# Patient Record
Sex: Female | Born: 1935 | Race: White | Hispanic: No | Smoking: Former smoker
Health system: Southern US, Community
[De-identification: ages and names within clinical notes are randomized; demographics above are authoritative.]

## PROBLEM LIST (undated history)

## (undated) DIAGNOSIS — M1990 Unspecified osteoarthritis, unspecified site: Secondary | ICD-10-CM

## (undated) DIAGNOSIS — E559 Vitamin D deficiency, unspecified: Secondary | ICD-10-CM

## (undated) DIAGNOSIS — C50919 Malignant neoplasm of unspecified site of unspecified female breast: Secondary | ICD-10-CM

## (undated) DIAGNOSIS — C801 Malignant (primary) neoplasm, unspecified: Secondary | ICD-10-CM

## (undated) DIAGNOSIS — R7303 Prediabetes: Secondary | ICD-10-CM

## (undated) DIAGNOSIS — E785 Hyperlipidemia, unspecified: Secondary | ICD-10-CM

## (undated) DIAGNOSIS — Z923 Personal history of irradiation: Secondary | ICD-10-CM

## (undated) DIAGNOSIS — C439 Malignant melanoma of skin, unspecified: Secondary | ICD-10-CM

## (undated) HISTORY — DX: Malignant melanoma of skin, unspecified: C43.9

## (undated) HISTORY — DX: Unspecified osteoarthritis, unspecified site: M19.90

## (undated) HISTORY — DX: Hyperlipidemia, unspecified: E78.5

## (undated) HISTORY — DX: Vitamin D deficiency, unspecified: E55.9

## (undated) HISTORY — DX: Prediabetes: R73.03

## (undated) HISTORY — DX: Malignant (primary) neoplasm, unspecified: C80.1

---

## 1997-04-21 ENCOUNTER — Other Ambulatory Visit: Admission: RE | Admit: 1997-04-21 | Discharge: 1997-04-21 | Payer: Self-pay | Admitting: Family Medicine

## 1998-04-14 ENCOUNTER — Other Ambulatory Visit: Admission: RE | Admit: 1998-04-14 | Discharge: 1998-04-14 | Payer: Self-pay | Admitting: Family Medicine

## 1999-04-11 ENCOUNTER — Encounter: Payer: Self-pay | Admitting: Family Medicine

## 1999-04-11 ENCOUNTER — Encounter: Admission: RE | Admit: 1999-04-11 | Discharge: 1999-04-11 | Payer: Self-pay | Admitting: Family Medicine

## 1999-04-19 ENCOUNTER — Other Ambulatory Visit: Admission: RE | Admit: 1999-04-19 | Discharge: 1999-04-19 | Payer: Self-pay | Admitting: Family Medicine

## 1999-09-12 ENCOUNTER — Other Ambulatory Visit: Admission: RE | Admit: 1999-09-12 | Discharge: 1999-09-12 | Payer: Self-pay | Admitting: Obstetrics & Gynecology

## 1999-09-12 ENCOUNTER — Encounter (INDEPENDENT_AMBULATORY_CARE_PROVIDER_SITE_OTHER): Payer: Self-pay | Admitting: Specialist

## 1999-10-01 ENCOUNTER — Other Ambulatory Visit: Admission: RE | Admit: 1999-10-01 | Discharge: 1999-10-01 | Payer: Self-pay | Admitting: Gastroenterology

## 1999-10-01 ENCOUNTER — Encounter (INDEPENDENT_AMBULATORY_CARE_PROVIDER_SITE_OTHER): Payer: Self-pay

## 2000-04-17 ENCOUNTER — Encounter: Admission: RE | Admit: 2000-04-17 | Discharge: 2000-04-17 | Payer: Self-pay | Admitting: Radiation Oncology

## 2000-04-17 ENCOUNTER — Encounter: Payer: Self-pay | Admitting: Family Medicine

## 2001-04-06 ENCOUNTER — Encounter: Payer: Self-pay | Admitting: Family Medicine

## 2001-04-06 ENCOUNTER — Encounter: Admission: RE | Admit: 2001-04-06 | Discharge: 2001-04-06 | Payer: Self-pay | Admitting: Family Medicine

## 2001-04-27 ENCOUNTER — Other Ambulatory Visit: Admission: RE | Admit: 2001-04-27 | Discharge: 2001-04-27 | Payer: Self-pay | Admitting: Family Medicine

## 2002-02-12 LAB — HM COLONOSCOPY

## 2002-04-08 ENCOUNTER — Encounter: Payer: Self-pay | Admitting: Family Medicine

## 2002-04-08 ENCOUNTER — Encounter: Admission: RE | Admit: 2002-04-08 | Discharge: 2002-04-08 | Payer: Self-pay | Admitting: Family Medicine

## 2003-04-11 ENCOUNTER — Encounter: Admission: RE | Admit: 2003-04-11 | Discharge: 2003-04-11 | Payer: Self-pay | Admitting: Family Medicine

## 2003-05-06 ENCOUNTER — Other Ambulatory Visit: Admission: RE | Admit: 2003-05-06 | Discharge: 2003-05-06 | Payer: Self-pay | Admitting: Family Medicine

## 2004-04-16 ENCOUNTER — Encounter: Admission: RE | Admit: 2004-04-16 | Discharge: 2004-04-16 | Payer: Self-pay | Admitting: Family Medicine

## 2005-04-19 ENCOUNTER — Encounter: Admission: RE | Admit: 2005-04-19 | Discharge: 2005-04-19 | Payer: Self-pay | Admitting: Family Medicine

## 2005-05-09 ENCOUNTER — Other Ambulatory Visit: Admission: RE | Admit: 2005-05-09 | Discharge: 2005-05-09 | Payer: Self-pay | Admitting: Family Medicine

## 2006-04-21 ENCOUNTER — Encounter: Admission: RE | Admit: 2006-04-21 | Discharge: 2006-04-21 | Payer: Self-pay | Admitting: Family Medicine

## 2007-01-22 HISTORY — PX: CATARACT EXTRACTION: SUR2

## 2007-04-27 ENCOUNTER — Encounter: Admission: RE | Admit: 2007-04-27 | Discharge: 2007-04-27 | Payer: Self-pay | Admitting: Family Medicine

## 2008-05-09 ENCOUNTER — Encounter: Admission: RE | Admit: 2008-05-09 | Discharge: 2008-05-09 | Payer: Self-pay | Admitting: Internal Medicine

## 2009-05-15 ENCOUNTER — Encounter: Admission: RE | Admit: 2009-05-15 | Discharge: 2009-05-15 | Payer: Self-pay | Admitting: Internal Medicine

## 2009-09-20 ENCOUNTER — Encounter: Admission: RE | Admit: 2009-09-20 | Discharge: 2009-09-20 | Payer: Self-pay | Admitting: Internal Medicine

## 2010-01-21 DIAGNOSIS — Z923 Personal history of irradiation: Secondary | ICD-10-CM

## 2010-01-21 DIAGNOSIS — C50919 Malignant neoplasm of unspecified site of unspecified female breast: Secondary | ICD-10-CM

## 2010-01-21 DIAGNOSIS — C801 Malignant (primary) neoplasm, unspecified: Secondary | ICD-10-CM

## 2010-01-21 HISTORY — DX: Personal history of irradiation: Z92.3

## 2010-01-21 HISTORY — DX: Malignant neoplasm of unspecified site of unspecified female breast: C50.919

## 2010-01-21 HISTORY — DX: Malignant (primary) neoplasm, unspecified: C80.1

## 2010-04-11 ENCOUNTER — Other Ambulatory Visit: Payer: Self-pay | Admitting: Internal Medicine

## 2010-04-11 DIAGNOSIS — Z1231 Encounter for screening mammogram for malignant neoplasm of breast: Secondary | ICD-10-CM

## 2010-05-21 ENCOUNTER — Ambulatory Visit
Admission: RE | Admit: 2010-05-21 | Discharge: 2010-05-21 | Disposition: A | Discharge status: Home / Self Care | Payer: Medicare Other | Source: Ambulatory Visit | Attending: Internal Medicine | Admitting: Internal Medicine

## 2010-05-21 DIAGNOSIS — Z1231 Encounter for screening mammogram for malignant neoplasm of breast: Secondary | ICD-10-CM

## 2010-05-22 ENCOUNTER — Other Ambulatory Visit: Payer: Self-pay | Admitting: Internal Medicine

## 2010-05-22 DIAGNOSIS — R928 Other abnormal and inconclusive findings on diagnostic imaging of breast: Secondary | ICD-10-CM

## 2010-05-28 ENCOUNTER — Ambulatory Visit
Admission: RE | Admit: 2010-05-28 | Discharge: 2010-05-28 | Disposition: A | Discharge status: Home / Self Care | Payer: Medicare Other | Source: Ambulatory Visit | Attending: Internal Medicine | Admitting: Internal Medicine

## 2010-05-28 ENCOUNTER — Other Ambulatory Visit: Payer: Self-pay | Admitting: Diagnostic Radiology

## 2010-05-28 ENCOUNTER — Other Ambulatory Visit: Payer: Self-pay | Admitting: Internal Medicine

## 2010-05-28 DIAGNOSIS — R928 Other abnormal and inconclusive findings on diagnostic imaging of breast: Secondary | ICD-10-CM

## 2010-05-28 DIAGNOSIS — N6310 Unspecified lump in the right breast, unspecified quadrant: Secondary | ICD-10-CM

## 2010-05-28 HISTORY — PX: BREAST BIOPSY: SHX20

## 2010-05-29 ENCOUNTER — Other Ambulatory Visit: Payer: Self-pay | Admitting: Internal Medicine

## 2010-05-29 DIAGNOSIS — C50911 Malignant neoplasm of unspecified site of right female breast: Secondary | ICD-10-CM

## 2010-06-02 ENCOUNTER — Ambulatory Visit
Admission: RE | Admit: 2010-06-02 | Discharge: 2010-06-02 | Disposition: A | Discharge status: Home / Self Care | Payer: Medicare Other | Source: Ambulatory Visit | Attending: Internal Medicine | Admitting: Internal Medicine

## 2010-06-02 ENCOUNTER — Other Ambulatory Visit: Payer: Medicare Other

## 2010-06-02 DIAGNOSIS — C50911 Malignant neoplasm of unspecified site of right female breast: Secondary | ICD-10-CM

## 2010-06-02 MED ORDER — GADOBENATE DIMEGLUMINE 529 MG/ML IV SOLN
13.0000 mL | Freq: Once | INTRAVENOUS | Status: AC | PRN
  Administered 2010-06-02: 13 mL via INTRAVENOUS

## 2010-06-06 ENCOUNTER — Other Ambulatory Visit: Payer: Self-pay | Admitting: Oncology

## 2010-06-06 ENCOUNTER — Encounter (HOSPITAL_BASED_OUTPATIENT_CLINIC_OR_DEPARTMENT_OTHER): Payer: Medicare Other | Admitting: Oncology

## 2010-06-06 ENCOUNTER — Encounter: Payer: Medicare Other | Admitting: Oncology

## 2010-06-06 DIAGNOSIS — C50919 Malignant neoplasm of unspecified site of unspecified female breast: Secondary | ICD-10-CM

## 2010-06-06 LAB — COMPREHENSIVE METABOLIC PANEL
AST: 20 U/L (ref 0–37)
Albumin: 3.9 g/dL (ref 3.5–5.2)
Alkaline Phosphatase: 54 U/L (ref 39–117)
BUN: 15 mg/dL (ref 6–23)
Calcium: 9.4 mg/dL (ref 8.4–10.5)
Chloride: 102 mEq/L (ref 96–112)
Glucose, Bld: 110 mg/dL — ABNORMAL HIGH (ref 70–99)
Total Protein: 6.7 g/dL (ref 6.0–8.3)

## 2010-06-06 LAB — CBC WITH DIFFERENTIAL/PLATELET
BASO%: 0.7 % (ref 0.0–2.0)
Basophils Absolute: 0 10*3/uL (ref 0.0–0.1)
EOS%: 2.3 % (ref 0.0–7.0)
Eosinophils Absolute: 0.1 10*3/uL (ref 0.0–0.5)
LYMPH%: 29.2 % (ref 14.0–49.7)
MCHC: 34.2 g/dL (ref 31.5–36.0)
MCV: 95.6 fL (ref 79.5–101.0)
Platelets: 222 10*3/uL (ref 145–400)
RBC: 4.16 10*6/uL (ref 3.70–5.45)
RDW: 12.9 % (ref 11.2–14.5)
WBC: 5.9 10*3/uL (ref 3.9–10.3)

## 2010-06-06 LAB — CANCER ANTIGEN 27.29: CA 27.29: 35 U/mL (ref 0–39)

## 2010-06-08 ENCOUNTER — Other Ambulatory Visit (INDEPENDENT_AMBULATORY_CARE_PROVIDER_SITE_OTHER): Payer: Self-pay | Admitting: Surgery

## 2010-06-08 DIAGNOSIS — C50911 Malignant neoplasm of unspecified site of right female breast: Secondary | ICD-10-CM

## 2010-06-11 ENCOUNTER — Other Ambulatory Visit: Payer: Self-pay | Admitting: Emergency Medicine

## 2010-06-11 ENCOUNTER — Other Ambulatory Visit (HOSPITAL_COMMUNITY)
Admission: RE | Admit: 2010-06-11 | Discharge: 2010-06-11 | Disposition: A | Discharge status: Home / Self Care | Payer: Medicare Other | Source: Ambulatory Visit | Attending: Internal Medicine | Admitting: Internal Medicine

## 2010-06-11 DIAGNOSIS — Z01419 Encounter for gynecological examination (general) (routine) without abnormal findings: Secondary | ICD-10-CM | POA: Insufficient documentation

## 2010-06-21 ENCOUNTER — Encounter (HOSPITAL_COMMUNITY)
Admission: RE | Admit: 2010-06-21 | Discharge: 2010-06-21 | Disposition: A | Discharge status: Home / Self Care | Payer: Medicare Other | Source: Ambulatory Visit | Attending: Surgery | Admitting: Surgery

## 2010-06-21 LAB — SURGICAL PCR SCREEN
MRSA, PCR: NEGATIVE
Staphylococcus aureus: NEGATIVE

## 2010-06-21 LAB — BASIC METABOLIC PANEL
BUN: 14 mg/dL (ref 6–23)
CO2: 31 mEq/L (ref 19–32)
Calcium: 9.6 mg/dL (ref 8.4–10.5)
Creatinine, Ser: 0.52 mg/dL (ref 0.4–1.2)
GFR calc Af Amer: >60 mL/min (ref >60)
Glucose, Bld: 94 mg/dL (ref 70–99)
Potassium: 4 mEq/L (ref 3.5–5.1)
Sodium: 143 mEq/L (ref 135–145)

## 2010-06-21 LAB — CBC
HCT: 39.6 % (ref 36.0–46.0)
Hemoglobin: 13.1 g/dL (ref 12.0–15.0)
MCH: 31.3 pg (ref 26.0–34.0)
MCHC: 33.1 g/dL (ref 30.0–36.0)
MCV: 94.7 fL (ref 78.0–100.0)
Platelets: 221 K/uL (ref 150–400)
RBC: 4.18 MIL/uL (ref 3.87–5.11)
RDW: 12.8 % (ref 11.5–15.5)
WBC: 5.7 K/uL (ref 4.0–10.5)

## 2010-06-29 ENCOUNTER — Other Ambulatory Visit (INDEPENDENT_AMBULATORY_CARE_PROVIDER_SITE_OTHER): Payer: Self-pay | Admitting: General Surgery

## 2010-06-29 ENCOUNTER — Ambulatory Visit
Admission: RE | Admit: 2010-06-29 | Discharge: 2010-06-29 | Disposition: A | Discharge status: Home / Self Care | Payer: Medicare Other | Source: Ambulatory Visit | Attending: Surgery | Admitting: Surgery

## 2010-06-29 ENCOUNTER — Ambulatory Visit (HOSPITAL_COMMUNITY)
Admission: RE | Admit: 2010-06-29 | Discharge: 2010-06-29 | Disposition: A | Discharge status: Home / Self Care | Payer: Medicare Other | Source: Ambulatory Visit | Attending: General Surgery | Admitting: General Surgery

## 2010-06-29 ENCOUNTER — Other Ambulatory Visit (INDEPENDENT_AMBULATORY_CARE_PROVIDER_SITE_OTHER): Payer: Self-pay | Admitting: Surgery

## 2010-06-29 DIAGNOSIS — Z01812 Encounter for preprocedural laboratory examination: Secondary | ICD-10-CM | POA: Insufficient documentation

## 2010-06-29 DIAGNOSIS — C50911 Malignant neoplasm of unspecified site of right female breast: Secondary | ICD-10-CM

## 2010-06-29 DIAGNOSIS — C50919 Malignant neoplasm of unspecified site of unspecified female breast: Secondary | ICD-10-CM | POA: Insufficient documentation

## 2010-06-29 HISTORY — PX: BREAST LUMPECTOMY: SHX2

## 2010-07-05 NOTE — Op Note (Signed)
NAMEKANOE, WANNER                ACCOUNT NO.:  [id number]  MEDICAL RECORD NO.:  [id number]  LOCATION:  [facility]                         FACILITY:  [facility]  PHYSICIAN:  Angelia Mould. Derrell Lolling, M.D.DATE OF BIRTH:  September 05, 1928  DATE OF PROCEDURE:  06/29/2010 DATE OF DISCHARGE:  06/29/2010                              OPERATIVE REPORT   PREOPERATIVE DIAGNOSIS:  Invasive ductal carcinoma, right breast, clinical stage T1c N0.  POSTOPERATIVE DIAGNOSIS:  Invasive ductal carcinoma, right breast, clinical stage T1c N0.  OPERATION PERFORMED:  Right partial mastectomy with needle localization.  SURGEON:  Angelia Mould. Derrell Lolling, MD  OPERATIVE INDICATIONS:  This is an 77 year old Caucasian female in good health.  Screening mammograms demonstrated some abnormal calcifications in the right breast at about the 8:30 position, 8 cm from the nipple. Stereotactic biopsy showed invasive cancer.  Follow up MRI showed biopsy changes measuring 2.1 cm, but prior to the biopsy by ultrasound, the mass was 8 mm in diameter.  This is invasive ductal carcinoma which is receptor positive.  She has been discussed in [facility] and it is felt that she is a candidate for breast conservation surgery.  Dr. Darnelle Catalan does not feel that she needs sentinel node biopsy as it will not alter her therapy.  Her nodes are clinically negative. She has been counseled as an outpatient and is brought to the operating room electively.  OPERATIVE TECHNIQUE:  The patient underwent wire localization at [facility] this morning prior to the surgery.  The wire was directed from medial to lateral and went very close to the mass.  It was said to be very superficial under the skin.  She was brought to the holding area and into the operating room where general anesthesia was induced.  The patient was identified as correct patient, correct procedure, and correct site.  Intravenous antibiotics were given.   The right breast was prepped and draped in a sterile fashion.  I used a marking pen to mark the areolar margin and the inframammary crease.  It appeared that the best incision would be a radially oriented ellipse.  This was marked and then after injecting 0.5% Marcaine with epinephrine, the radially oriented ellipse was made at about the 8 o'clock position.  Dissection was carried down into the breast tissue around localizing wire.  I felt like I got well around the specimen, but I did see the tip of the wire in the inferolateral aspect.  The specimen was marked with the 6-color margin marker kit.  Specimen mammogram was obtained and the radiologist felt that we had adequate excision.  I chose to excise a small 2 cm x 3 cm area inferolateral as a re-excised margin.  This was marked with the color markers and sent as a separate specimen as re-excision of inferior and lateral margins.  Hemostasis was excellent and achieved with electrocautery.  The wound was irrigated with saline.  The breast tissue was closed with several layers with interrupted suture of 3-0 Vicryl and skin closed with running subcuticular suture of 4-0 Monocryl and Dermabond.  The patient tolerated the procedure well and was taken to recovery room in stable condition.  Estimated  blood loss was about 10-15 mL.  Complications none.  Sponge, needle, and instrument counts were correct.     Angelia Mould. Derrell Lolling, M.D.     HMI/MEDQ  D:  06/29/2010  T:  06/30/2010  Job:  [id number]  cc:   Lovenia Kim, D.O. Lowella Dell, M.D. Radene Gunning, M.D., Ph.D.  Electronically Signed by Claud Kelp M.D. on 07/05/2010 05:32:20 PM

## 2010-07-16 ENCOUNTER — Ambulatory Visit
Admission: RE | Admit: 2010-07-16 | Discharge: 2010-07-16 | Disposition: A | Discharge status: Home / Self Care | Payer: Medicare Other | Source: Ambulatory Visit | Attending: Radiation Oncology | Admitting: Radiation Oncology

## 2010-07-16 DIAGNOSIS — Z510 Encounter for antineoplastic radiation therapy: Secondary | ICD-10-CM | POA: Insufficient documentation

## 2010-07-16 DIAGNOSIS — Z170 Estrogen receptor positive status [ER+]: Secondary | ICD-10-CM | POA: Insufficient documentation

## 2010-07-16 DIAGNOSIS — C50919 Malignant neoplasm of unspecified site of unspecified female breast: Secondary | ICD-10-CM | POA: Insufficient documentation

## 2010-07-30 ENCOUNTER — Encounter (HOSPITAL_BASED_OUTPATIENT_CLINIC_OR_DEPARTMENT_OTHER): Payer: Medicare Other | Admitting: Oncology

## 2010-07-30 DIAGNOSIS — C50519 Malignant neoplasm of lower-outer quadrant of unspecified female breast: Secondary | ICD-10-CM

## 2010-07-30 DIAGNOSIS — Z170 Estrogen receptor positive status [ER+]: Secondary | ICD-10-CM

## 2010-08-21 ENCOUNTER — Encounter (INDEPENDENT_AMBULATORY_CARE_PROVIDER_SITE_OTHER): Payer: Self-pay | Admitting: General Surgery

## 2010-10-19 ENCOUNTER — Ambulatory Visit
Admission: RE | Admit: 2010-10-19 | Discharge: 2010-10-19 | Disposition: A | Discharge status: Home / Self Care | Payer: Medicare Other | Source: Ambulatory Visit | Attending: Radiation Oncology | Admitting: Radiation Oncology

## 2010-11-22 ENCOUNTER — Telehealth: Payer: Self-pay | Admitting: Oncology

## 2010-11-22 ENCOUNTER — Encounter (HOSPITAL_BASED_OUTPATIENT_CLINIC_OR_DEPARTMENT_OTHER): Payer: Medicare Other | Admitting: Oncology

## 2010-11-22 ENCOUNTER — Other Ambulatory Visit: Payer: Self-pay | Admitting: Oncology

## 2010-11-22 DIAGNOSIS — C50419 Malignant neoplasm of upper-outer quadrant of unspecified female breast: Secondary | ICD-10-CM

## 2010-11-22 DIAGNOSIS — C50519 Malignant neoplasm of lower-outer quadrant of unspecified female breast: Secondary | ICD-10-CM

## 2010-11-22 DIAGNOSIS — Z170 Estrogen receptor positive status [ER+]: Secondary | ICD-10-CM

## 2010-11-22 DIAGNOSIS — C50919 Malignant neoplasm of unspecified site of unspecified female breast: Secondary | ICD-10-CM

## 2010-11-22 LAB — CBC WITH DIFFERENTIAL/PLATELET
BASO%: 0.5 % (ref 0.0–2.0)
HCT: 39.1 % (ref 34.8–46.6)
LYMPH%: 26.1 % (ref 14.0–49.7)
MCH: 31.8 pg (ref 25.1–34.0)
MCHC: 34 g/dL (ref 31.5–36.0)
MCV: 93.5 fL (ref 79.5–101.0)
MONO#: 0.5 10*3/uL (ref 0.1–0.9)
MONO%: 8.5 % (ref 0.0–14.0)
NEUT%: 62.8 % (ref 38.4–76.8)
Platelets: 227 10*3/uL (ref 145–400)
RBC: 4.18 10*6/uL (ref 3.70–5.45)
nRBC: 0 % (ref 0–0)

## 2010-11-26 NOTE — Progress Notes (Signed)
CC:   Angelia Mould. Derrell Lolling, M.D. Radene Gunning, M.D., Ph.D. Lovenia Kim, D.O.  Victoria Long returns today for followup of her breast cancer, and specifically after completing her radiation treatments in August.  She tolerated the treatment well with no unusual side effects and she tells me her energy was fine.  She still walks about 2 mile a day.  She played golf once since her surgery and that went okay.  She has had no problems with fevers, rash or pain except where the bra is catching a couple of acrochordons in the lateral aspect of the right breast and in the right axilla.  She would like these removed.  She goes to Dr. Venancio Poisson for that.  Otherwise a separately scanned detailed review of systems was unremarkable and her medication list was also separately updated today.  PHYSICAL EXAMINATION:  Temperature is 97.8, pulse 77, respiratory rate 20, blood pressure 122/67, and her weight is 141 pounds.  Sclerae are not icteric.  Oropharynx is clear.  Neck:  Supple.  Lungs:  No crackles or wheezes.  Heart:  Regular rate and rhythm.  Right breast:  Status post lumpectomy and radiation.  No evidence of local recurrence.  No significant skin changes.  Good cosmetic result.  Left breast:  No suspicious masses.  Abdomen:  Benign.  Musculoskeletal:  No focal spinal tenderness.  No peripheral edema.  Neurologic:  Nonfocal.  LABORATORY DATA:  White cell count of 6.1, hemoglobin 13.3, platelets 227,000.  IMPRESSION AND PLAN:  An 77 year old [place] woman status post right lumpectomy June of 2012 for a T1b N0 grade 1 invasive ductal carcinoma, with abundant extracellular mucin, and no lymph node involvement (T1b, N0, or stage I).  Status post radiation completed August of 2012.  Since her tumor was estrogen and progesterone receptor positive, we spent the better part of her 30 minute appointment today discussing the advantages and disadvantages of antiestrogens.  She has a good idea of the  risk reduction that can be achieved with these medications and also the possible side effects, toxicities and complications.  After much discussion, she really would prefer not to take tamoxifen or letrozole and I am not uncomfortable with her decision since her overall prognosis in any case is good.   Accordingly she will return to see me in May, after her routine mammography and assuming all is going well we will see her on a once a year basis until she completes 5 years of followup.    ______________________________ Lowella Dell, M.D. GCM/MEDQ  D:  11/23/2010  T:  11/26/2010  Job:  [id number]

## 2011-04-16 ENCOUNTER — Other Ambulatory Visit: Payer: Self-pay | Admitting: Internal Medicine

## 2011-04-16 DIAGNOSIS — Z853 Personal history of malignant neoplasm of breast: Secondary | ICD-10-CM

## 2011-05-27 ENCOUNTER — Ambulatory Visit
Admission: RE | Admit: 2011-05-27 | Discharge: 2011-05-27 | Disposition: A | Discharge status: Home / Self Care | Payer: Medicare Other | Source: Ambulatory Visit | Attending: Internal Medicine | Admitting: Internal Medicine

## 2011-05-27 DIAGNOSIS — Z853 Personal history of malignant neoplasm of breast: Secondary | ICD-10-CM

## 2011-06-11 ENCOUNTER — Other Ambulatory Visit (HOSPITAL_BASED_OUTPATIENT_CLINIC_OR_DEPARTMENT_OTHER): Payer: Medicare Other | Admitting: Lab

## 2011-06-11 ENCOUNTER — Telehealth: Payer: Self-pay | Admitting: Oncology

## 2011-06-11 ENCOUNTER — Ambulatory Visit (HOSPITAL_BASED_OUTPATIENT_CLINIC_OR_DEPARTMENT_OTHER): Payer: Medicare Other | Admitting: Oncology

## 2011-06-11 VITALS — BP 117/70 | HR 71 | Temp 98.6°F | Ht 63.0 in | Wt 138.2 lb

## 2011-06-11 DIAGNOSIS — C50519 Malignant neoplasm of lower-outer quadrant of unspecified female breast: Secondary | ICD-10-CM

## 2011-06-11 DIAGNOSIS — Z170 Estrogen receptor positive status [ER+]: Secondary | ICD-10-CM

## 2011-06-11 DIAGNOSIS — C50919 Malignant neoplasm of unspecified site of unspecified female breast: Secondary | ICD-10-CM

## 2011-06-11 LAB — CBC WITH DIFFERENTIAL/PLATELET
BASO%: 0.6 % (ref 0.0–2.0)
Basophils Absolute: 0 10*3/uL (ref 0.0–0.1)
EOS%: 2 % (ref 0.0–7.0)
HGB: 13.4 g/dL (ref 11.6–15.9)
MCH: 32.4 pg (ref 25.1–34.0)
MCHC: 33.7 g/dL (ref 31.5–36.0)
MCV: 96 fL (ref 79.5–101.0)
MONO%: 9.5 % (ref 0.0–14.0)
NEUT%: 59.3 % (ref 38.4–76.8)
RDW: 13.9 % (ref 11.2–14.5)

## 2011-06-11 LAB — COMPREHENSIVE METABOLIC PANEL
AST: 19 U/L (ref 0–37)
Alkaline Phosphatase: 49 U/L (ref 39–117)
BUN: 13 mg/dL (ref 6–23)
Glucose, Bld: 96 mg/dL (ref 70–99)
Sodium: 142 mEq/L (ref 135–145)
Total Bilirubin: 0.6 mg/dL (ref 0.3–1.2)

## 2011-06-11 NOTE — Telephone Encounter (Signed)
gve the pt her nov 2013 appt calendar °

## 2011-06-11 NOTE — Progress Notes (Signed)
ID: [name]   DOB: 18-Jul-1928  MR#: [id number]  WUJ#:[id number]  HISTORY OF PRESENT ILLNESS: Victoria Long had bilateral screening mammography April 30th, 2012 at the [facility] showing a possible mass in the right breast.  She was brought back for additional views on May 7th, and Dr. Deboraha Long was able to confirm an oval, slightly irregular mass in the outer portion of the right breast which was not palpable by her exam.  Ultrasound showed an ill-defined, hypoechoic nodule measuring 8 mm with no abnormal lymph nodes on the right.  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   With this information, the patient was referred for bilateral MRIs of the breast performed Jun 02, 2010.  This showed a solitary area of non-mass enhancement measuring 2.1 cm, some of this being postbiopsy change.  There were no suspicious lymph nodes or evidence of contralateral disease. Her case was presented at the multidisciplinary Breast Cancer conference and she proceeded to breast conserving surgery, as detailed below.  INTERVAL HISTORY: Victoria Long returns today for followup of her breast cancer. Interval history is unremarkable. She knows that it's been a year already since her surgery. She is not playing as much golf as before because her good friend has been diagnosed with lung cancer and she is coming here receiving chemotherapy at present.  REVIEW OF SYSTEMS: Victoria Long occasionally has pain in the left axilla. She of course worries of this might be cancer coming back. She was reassured today regarding that. Otherwise she is normally active has no unusual headaches, visual changes, cough, phlegm production, pleurisy, shortness of breath, change in his in bowel or bladder habits, rash, fever, bleeding, or any other symptoms. In fact a detailed review  of systems today was normal  PAST MEDICAL HISTORY: Significant for COPD/asthma, history of depression and anxiety, history of GERD, history of the right total knee replacement in May 2009, history of obesity now much improved, excision of a meatal urethral lesion May 2010.  FAMILY HISTORY There is no history of breast or ovarian cancer in the family.  The patient's mother's sister had a diagnosis of lung cancer made at the age of 76.  GYNECOLOGIC HISTORY: She had menarche at age 78, underwent menopause in 78.  She had her first live birth at age 78 and had a total of 4 live births and started having mammography at age 78  SOCIAL HISTORY: She is a retired Runner, broadcasting/film/video.  Her son, Victoria Long present at her initial visit with his wife, Victoria Long.  A second child is Victoria Long.  Both of them live in [place].  Son Victoria Long lives in [place] and son Victoria Long lives in [place].   ADVANCED DIRECTIVES: Not in place  HEALTH MAINTENANCE: History  Substance Use Topics  . Smoking status: Not on file  . Smokeless tobacco: Not on file  . Alcohol Use: Not on file     Colonoscopy:  PAP:  Bone density:  Lipid panel:  No Known Allergies  Current Outpatient Prescriptions  Medication Sig Dispense Refill  . calcium carbonate (OS-CAL) 600 MG TABS Take 600 mg by mouth 2 (two) times daily with a meal.      . Cholecalciferol (VITAMIN D) 1000 UNITS capsule Take 1,000 Units by mouth daily.      Marland Kitchen KRILL OIL PO Take  1 tablet by mouth daily.      Marland Kitchen LYSINE HCL PO Take 1 tablet by mouth daily.      . Red Yeast Rice Extract (RED YEAST RICE PO) Take 1 tablet by mouth daily.        OBJECTIVE: Elderly white woman who appears well Filed Vitals:   06/11/11 1513  BP: 117/70  Pulse: 71  Temp: 98.6 F (37 C)     Body mass index is 24.48 kg/(m^2).    ECOG FS:1  Sclerae unicteric Oropharynx clear No peripheral adenopathy and specifically careful palpation of the left axilla is Lungs no  rales or rhonchi Heart regular rate and rhythm Abd benign MSK no focal spinal tenderness, no peripheral edema Neuro: nonfocal Breasts: The right breast is status post lumpectomy. There is no evidence of local recurrence. Left breast is unremarkable  LAB RESULTS: Lab Results  Component Value Date   WBC 5.0 06/11/2011   NEUTROABS 3.0 06/11/2011   HGB 13.4 06/11/2011   HCT 39.7 06/11/2011   MCV 96.0 06/11/2011   PLT 209 06/11/2011      Chemistry      Component Value Date/Time   NA 143 06/21/2010 1050   K 4.0 06/21/2010 1050   CL 106 06/21/2010 1050   CO2 31 06/21/2010 1050   BUN 14 06/21/2010 1050   CREATININE 0.52 06/21/2010 1050      Component Value Date/Time   CALCIUM 9.6 06/21/2010 1050   ALKPHOS 54 06/06/2010 1207   AST 20 06/06/2010 1207   ALT 18 06/06/2010 1207   BILITOT 0.4 06/06/2010 1207       Lab Results  Component Value Date   LABCA2 35 06/06/2010    No components found with this basename: [id number]    No results found for this basename: INR:1;PROTIME:1 in the last 168 hours  Urinalysis No results found for this basename: colorurine, appearanceur, labspec, phurine, glucoseu, hgbur, bilirubinur, ketonesur, proteinur, urobilinogen, nitrite, leukocytesur    STUDIES: Mm Digital Diagnostic Bilat  05/27/2011  *RADIOLOGY REPORT*  Clinical Data:  History of right breast cancer, diagnosed in 2012. Patient is asymptomatic.  DIGITAL DIAGNOSTIC BILATERAL MAMMOGRAM WITH CAD  Comparison:  With priors  Findings:  There are scattered fibroglandular densities bilaterally.  There is surgical scarring in the deep upper outer right breast.  No mass, nonsurgical distortion, or microcalcification is identified in either breast to suggest malignancy.  Mammographic images were processed with CAD.  IMPRESSION: No evidence of malignancy in either breast.  Lumpectomy changes on the right.  Bilateral diagnostic mammogram in 1 year is recommended.  BI-RADS CATEGORY 2:  Benign finding(s).  Original  Report Authenticated By: Britta Mccreedy, M.D.    ASSESSMENT: 78 year old [place] woman status post right lumpectomy June of 2012 for a T1b N0 grade 1 invasive ductal carcinoma, with abundant extracellular mucin, and no lymph node involvement (T1b, N0, or stage I).  Status post radiation completed August of 2012. No adjuvant antiestrogens given.  PLAN: Syrai is doing terrific. She will see [name] again in 6 months, and then she will see me again in one year. At that time we'll start seeing her on a once a year basis. She is understandably concerned regarding her friend diagnosed with lung cancer, but I emphasized of breast cancer lung cancer really very different animals and her on situation is not comparable. She knows to call for any problems that may develop before the next visit  Victoria Long C    06/11/2011

## 2011-07-10 ENCOUNTER — Other Ambulatory Visit: Payer: Self-pay | Admitting: Internal Medicine

## 2011-07-10 DIAGNOSIS — N644 Mastodynia: Secondary | ICD-10-CM

## 2011-07-10 DIAGNOSIS — Z853 Personal history of malignant neoplasm of breast: Secondary | ICD-10-CM

## 2011-07-16 ENCOUNTER — Ambulatory Visit
Admission: RE | Admit: 2011-07-16 | Discharge: 2011-07-16 | Disposition: A | Discharge status: Home / Self Care | Payer: Medicare Other | Source: Ambulatory Visit | Attending: Internal Medicine | Admitting: Internal Medicine

## 2011-07-16 DIAGNOSIS — N644 Mastodynia: Secondary | ICD-10-CM

## 2011-07-16 DIAGNOSIS — Z853 Personal history of malignant neoplasm of breast: Secondary | ICD-10-CM

## 2011-12-10 ENCOUNTER — Telehealth: Payer: Self-pay | Admitting: *Deleted

## 2011-12-10 ENCOUNTER — Encounter: Payer: Self-pay | Admitting: Physician Assistant

## 2011-12-10 ENCOUNTER — Ambulatory Visit (HOSPITAL_BASED_OUTPATIENT_CLINIC_OR_DEPARTMENT_OTHER): Payer: Medicare Other | Admitting: Physician Assistant

## 2011-12-10 ENCOUNTER — Other Ambulatory Visit (HOSPITAL_BASED_OUTPATIENT_CLINIC_OR_DEPARTMENT_OTHER): Payer: Medicare Other | Admitting: Lab

## 2011-12-10 VITALS — BP 127/64 | HR 67 | Temp 98.3°F | Resp 20 | Ht 63.0 in | Wt 134.7 lb

## 2011-12-10 DIAGNOSIS — C50919 Malignant neoplasm of unspecified site of unspecified female breast: Secondary | ICD-10-CM

## 2011-12-10 DIAGNOSIS — Z853 Personal history of malignant neoplasm of breast: Secondary | ICD-10-CM

## 2011-12-10 DIAGNOSIS — C50519 Malignant neoplasm of lower-outer quadrant of unspecified female breast: Secondary | ICD-10-CM

## 2011-12-10 DIAGNOSIS — Z170 Estrogen receptor positive status [ER+]: Secondary | ICD-10-CM

## 2011-12-10 DIAGNOSIS — C50911 Malignant neoplasm of unspecified site of right female breast: Secondary | ICD-10-CM

## 2011-12-10 LAB — CBC WITH DIFFERENTIAL/PLATELET
Basophils Absolute: 0 10*3/uL (ref 0.0–0.1)
Eosinophils Absolute: 0.1 10*3/uL (ref 0.0–0.5)
HCT: 39.1 % (ref 34.8–46.6)
LYMPH%: 28.9 % (ref 14.0–49.7)
MCV: 96 fL (ref 79.5–101.0)
MONO#: 0.6 10*3/uL (ref 0.1–0.9)
NEUT#: 3.2 10*3/uL (ref 1.5–6.5)
NEUT%: 58.1 % (ref 38.4–76.8)
Platelets: 217 10*3/uL (ref 145–400)
WBC: 5.6 10*3/uL (ref 3.9–10.3)

## 2011-12-10 LAB — COMPREHENSIVE METABOLIC PANEL (CC13)
Albumin: 3.6 g/dL (ref 3.5–5.0)
BUN: 14 mg/dL (ref 7.0–26.0)
CO2: 27 mEq/L (ref 22–29)
Calcium: 9.9 mg/dL (ref 8.4–10.4)
Creatinine: 0.7 mg/dL (ref 0.6–1.1)
Glucose: 100 mg/dl — ABNORMAL HIGH (ref 70–99)
Total Bilirubin: 0.74 mg/dL (ref 0.20–1.20)
Total Protein: 6.3 g/dL — ABNORMAL LOW (ref 6.4–8.3)

## 2011-12-10 NOTE — Patient Instructions (Signed)
Everything looks great!  Routine mammogram in May, followed by labs and a follow up with Dr. Darnelle Catalan.  Call with problems or questions.   [phone]

## 2011-12-10 NOTE — Telephone Encounter (Signed)
Gave patient appointment for 05-2012 mammogram breast at the breast center

## 2011-12-10 NOTE — Progress Notes (Signed)
ID: [name]   DOB: 04-29-1928  MR#: [id number]  WUJ#:[id number]  HISTORY OF PRESENT ILLNESS: Victoria Long had bilateral screening mammography April 30th, 2012 at the [facility] showing a possible mass in the right breast.  She was brought back for additional views on May 7th, and Dr. Deboraha Sprang was able to confirm an oval, slightly irregular mass in the outer portion of the right breast which was not palpable by her exam.  Ultrasound showed an ill-defined, hypoechoic nodule measuring 8 mm with no abnormal lymph nodes on the right.  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   With this information, the patient was referred for bilateral MRIs of the breast performed Jun 02, 2010.  This showed a solitary area of non-mass enhancement measuring 2.1 cm, some of this being postbiopsy change.  There were no suspicious lymph nodes or evidence of contralateral disease. Her case was presented at the multidisciplinary Breast Cancer conference and she proceeded to breast conserving surgery, as detailed below.  INTERVAL HISTORY: Victoria Long returns today for routine followup of her right breast cancer. Interval history is unremarkable. She staying busy. She is exercising, walking at least 4 days weekly, 2 miles per day. She's lost about 3-1/2 pounds since her last visit here, and although she has not been trying to lose weight, she admits that she has been exercising and "trying to watch her diet".   REVIEW OF SYSTEMS: Victoria Long has had no recent illnesses and denies any fevers, chills, night sweats, or hot flashes. She's eating and drinking well with no nausea or change in bowel habits. She's had no new cough, shortness of breath, chest pain, or palpitations. There've been no abnormal headaches or dizziness, and she denies any unusual myalgias,  arthralgias, bony pain, or peripheral swelling.  In fact, a detailed review of systems is otherwise noncontributory.   PAST MEDICAL HISTORY: Significant for COPD/asthma, history of depression and anxiety, history of GERD, history of the right total knee replacement in May 2009, history of obesity now much improved, excision of a meatal urethral lesion May 2010.  FAMILY HISTORY There is no history of breast or ovarian cancer in the family.  The patient's mother's sister had a diagnosis of lung cancer made at the age of 26.  GYNECOLOGIC HISTORY: She had menarche at age 67, underwent menopause in 4.  She had her first live birth at age 31 and had a total of 4 live births and started having mammography at age 72.  SOCIAL HISTORY: She is a retired Runner, broadcasting/film/video.  Her son, Victoria Long present at her initial visit with his wife, Victoria Long.  A second child is Victoria Long.  Both of them live in [place].  Son Victoria Long lives in [place] and son Victoria Long lives in [place].   ADVANCED DIRECTIVES: Not in place  HEALTH MAINTENANCE: History  Substance Use Topics  . Smoking status: Never Smoker   . Smokeless tobacco: Never Used  . Alcohol Use:      Colonoscopy:  PAP:  Bone density:  Lipid panel:  No Known Allergies  Current Outpatient Prescriptions  Medication Sig Dispense Refill  . calcium carbonate (OS-CAL) 600 MG TABS Take 600 mg by mouth 2 (two) times daily with a meal.      . Cholecalciferol (VITAMIN D) 1000 UNITS capsule Take 1,000  Units by mouth daily.      Marland Kitchen KRILL OIL PO Take 1 tablet by mouth daily.      Marland Kitchen LYSINE HCL PO Take 1 tablet by mouth daily.      . Red Yeast Rice Extract (RED YEAST RICE PO) Take 1 tablet by mouth daily.        OBJECTIVE: Victoria Long who appears well Filed Vitals:   12/10/11 1130  BP: 127/64  Pulse: 67  Temp: 98.3 F (36.8 C)  Resp: 20     Body mass index is 23.86 kg/(m^2).    ECOG FS:0 Filed Weights   12/10/11 1130    Weight: 134 lb 11.2 oz (61.1 kg)   Sclerae unicteric Oropharynx clear No peripheral adenopathy Lungs no rales or rhonchi Heart regular rate and rhythm Abdomen is soft, nontender, with positive bowel sounds MSK no focal spinal tenderness, no peripheral edema Neuro: nonfocal, alert and oriented x3 Breasts: The right breast is status post lumpectomy. There is no evidence of local recurrence. Left breast is unremarkable.  Axillae are benign bilaterally.  LAB RESULTS: Lab Results  Component Value Date   WBC 5.6 12/10/2011   NEUTROABS 3.2 12/10/2011   HGB 13.5 12/10/2011   HCT 39.1 12/10/2011   MCV 96.0 12/10/2011   PLT 217 12/10/2011      Chemistry      Component Value Date/Time   NA 142 12/10/2011 1047   NA 142 06/11/2011 1433   K 4.0 12/10/2011 1047   K 3.9 06/11/2011 1433   CL 109* 12/10/2011 1047   CL 107 06/11/2011 1433   CO2 27 12/10/2011 1047   CO2 24 06/11/2011 1433   BUN 14.0 12/10/2011 1047   BUN 13 06/11/2011 1433   CREATININE 0.7 12/10/2011 1047   CREATININE 0.65 06/11/2011 1433      Component Value Date/Time   CALCIUM 9.9 12/10/2011 1047   CALCIUM 9.2 06/11/2011 1433   ALKPHOS 53 12/10/2011 1047   ALKPHOS 49 06/11/2011 1433   AST 17 12/10/2011 1047   AST 19 06/11/2011 1433   ALT 15 12/10/2011 1047   ALT 15 06/11/2011 1433   BILITOT 0.74 12/10/2011 1047   BILITOT 0.6 06/11/2011 1433       Lab Results  Component Value Date   LABCA2 35 06/06/2010    STUDIES: Mm Digital Diagnostic Bilat  05/27/2011  *RADIOLOGY REPORT*  Clinical Data:  History of right breast cancer, diagnosed in 2012. Patient is asymptomatic.  DIGITAL DIAGNOSTIC BILATERAL MAMMOGRAM WITH CAD  Comparison:  With priors  Findings:  There are scattered fibroglandular densities bilaterally.  There is surgical scarring in the deep upper outer right breast.  No mass, nonsurgical distortion, or microcalcification is identified in either breast to suggest malignancy.  Mammographic images were processed  with CAD.  IMPRESSION: No evidence of malignancy in either breast.  Lumpectomy changes on the right.  Bilateral diagnostic mammogram in 1 year is recommended.  BI-RADS CATEGORY 2:  Benign finding(s).  Original Report Authenticated By: Britta Mccreedy, M.D.    ASSESSMENT: 78 year old [place] Long   (1)  status post right lumpectomy June of 2012 for a T1b N0 grade 1 invasive ductal carcinoma, with abundant extracellular mucin, and no lymph node involvement (T1b, N0, or stage I).    (2)Status post radiation completed August of 2012.   (3)  No adjuvant antiestrogens given.  PLAN:  Victoria Long is doing very well with regards to her breast cancer. She will have her next annual mammogram in May,  after which she will have labs and physical exam he her with Dr. Darnelle Catalan.  In the meanwhile she knows to call with any changes or problems.   Conda Wannamaker    12/10/2011

## 2011-12-31 ENCOUNTER — Other Ambulatory Visit: Payer: Self-pay

## 2012-01-22 DIAGNOSIS — C439 Malignant melanoma of skin, unspecified: Secondary | ICD-10-CM

## 2012-01-22 HISTORY — DX: Malignant melanoma of skin, unspecified: C43.9

## 2012-01-22 HISTORY — PX: MELANOMA EXCISION: SHX5266

## 2012-05-07 ENCOUNTER — Telehealth: Payer: Self-pay | Admitting: *Deleted

## 2012-05-07 NOTE — Telephone Encounter (Signed)
Lm informing the pt that we are closed on 5/26. gv appt for 5/27. Also informed her that i will be mailing a letter/cal as a reminder...td

## 2012-05-11 ENCOUNTER — Other Ambulatory Visit (HOSPITAL_COMMUNITY): Payer: Self-pay | Admitting: Internal Medicine

## 2012-05-11 ENCOUNTER — Ambulatory Visit (HOSPITAL_COMMUNITY)
Admission: RE | Admit: 2012-05-11 | Discharge: 2012-05-11 | Disposition: A | Discharge status: Home / Self Care | Payer: Medicare Other | Source: Ambulatory Visit | Attending: Internal Medicine | Admitting: Internal Medicine

## 2012-05-11 DIAGNOSIS — M25519 Pain in unspecified shoulder: Secondary | ICD-10-CM | POA: Insufficient documentation

## 2012-05-11 DIAGNOSIS — M25511 Pain in right shoulder: Secondary | ICD-10-CM

## 2012-06-01 ENCOUNTER — Ambulatory Visit
Admission: RE | Admit: 2012-06-01 | Discharge: 2012-06-01 | Disposition: A | Discharge status: Home / Self Care | Payer: Medicare Other | Source: Ambulatory Visit | Attending: Physician Assistant | Admitting: Physician Assistant

## 2012-06-01 DIAGNOSIS — Z853 Personal history of malignant neoplasm of breast: Secondary | ICD-10-CM

## 2012-06-08 ENCOUNTER — Other Ambulatory Visit (HOSPITAL_BASED_OUTPATIENT_CLINIC_OR_DEPARTMENT_OTHER): Payer: Medicare Other | Admitting: Lab

## 2012-06-08 DIAGNOSIS — C50519 Malignant neoplasm of lower-outer quadrant of unspecified female breast: Secondary | ICD-10-CM

## 2012-06-08 DIAGNOSIS — C50911 Malignant neoplasm of unspecified site of right female breast: Secondary | ICD-10-CM

## 2012-06-08 LAB — CBC WITH DIFFERENTIAL/PLATELET
BASO%: 0.7 % (ref 0.0–2.0)
Basophils Absolute: 0 10*3/uL (ref 0.0–0.1)
EOS%: 3.8 % (ref 0.0–7.0)
MCH: 31.4 pg (ref 25.1–34.0)
MCHC: 32.9 g/dL (ref 31.5–36.0)
MCV: 95.4 fL (ref 79.5–101.0)
MONO%: 9.8 % (ref 0.0–14.0)
NEUT%: 58.9 % (ref 38.4–76.8)
RDW: 13.4 % (ref 11.2–14.5)
lymph#: 1.4 10*3/uL (ref 0.9–3.3)

## 2012-06-08 LAB — COMPREHENSIVE METABOLIC PANEL (CC13)
ALT: 17 U/L (ref 0–55)
AST: 17 U/L (ref 5–34)
Alkaline Phosphatase: 47 U/L (ref 40–150)
BUN: 14.3 mg/dL (ref 7.0–26.0)
Calcium: 9.6 mg/dL (ref 8.4–10.4)
Chloride: 107 mEq/L (ref 98–107)
Creatinine: 0.7 mg/dL (ref 0.6–1.1)

## 2012-06-15 ENCOUNTER — Ambulatory Visit: Payer: Medicare Other | Admitting: Oncology

## 2012-06-16 ENCOUNTER — Ambulatory Visit (HOSPITAL_BASED_OUTPATIENT_CLINIC_OR_DEPARTMENT_OTHER): Payer: Medicare Other | Admitting: Oncology

## 2012-06-16 VITALS — BP 130/65 | HR 65 | Temp 98.3°F | Resp 20 | Ht 63.0 in | Wt 136.8 lb

## 2012-06-16 DIAGNOSIS — C50919 Malignant neoplasm of unspecified site of unspecified female breast: Secondary | ICD-10-CM

## 2012-06-16 DIAGNOSIS — C50911 Malignant neoplasm of unspecified site of right female breast: Secondary | ICD-10-CM

## 2012-06-16 NOTE — Progress Notes (Signed)
ID: [name]   DOB: 01/13/1929  MR#: [id number]  AOZ#:[id number]  PCP: Nadean Corwin, MD  HISTORY OF PRESENT ILLNESS: Nan had bilateral screening mammography April 30th, 2012 at the [facility] showing a possible mass in the right breast.  She was brought back for additional views on May 7th, and Dr. Deboraha Sprang was able to confirm an oval, slightly irregular mass in the outer portion of the right breast which was not palpable by her exam.  Ultrasound showed an ill-defined, hypoechoic nodule measuring 8 mm with no abnormal lymph nodes on the right.  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   With this information, the patient was referred for bilateral MRIs of the breast performed Jun 02, 2010.  This showed a solitary area of non-mass enhancement measuring 2.1 cm, some of this being postbiopsy change.  There were no suspicious lymph nodes or evidence of contralateral disease. Her case was presented at the multidisciplinary Breast Cancer conference and she proceeded to breast conserving surgery, as detailed below.  INTERVAL HISTORY: Laberta returns today for followup of her right breast cancer. Interval history is unremarkable. She enjoyed the recent holidays with her family.  REVIEW OF SYSTEMS: Jakelyn is having some "arthritis" problems, particularly involving the right shoulder and left hand. These are not terribly intense or persistent and she is controlling them well with Advil. Sometimes she has a little bit of discomfort of at the surgical site , but this is fleeting and she does not take any medication for this. A detailed review of systems today was otherwise noncontributory.  PAST MEDICAL HISTORY: Significant for COPD/asthma, history of depression and anxiety, history of GERD, history of the right  total knee replacement in May 2009, history of obesity now much improved, excision of a meatal urethral lesion May 2010.  FAMILY HISTORY There is no history of breast or ovarian cancer in the family.  The patient's mother's sister had a diagnosis of lung cancer made at the age of 3.  GYNECOLOGIC HISTORY: She had menarche at age 55, underwent menopause in 38.  She had her first live birth at age 52 and had a total of 4 live births and started having mammography at age 11.  SOCIAL HISTORY: She is a retired Runner, broadcasting/film/video.  Her son, Felipa Emory present at her initial visit with his wife, Selena Batten.  A second child is Astronomer.  Both of them live in [place].  Son Raelene Bott lives in [place] and son Wyline Beady lives in [place].   ADVANCED DIRECTIVES: Not in place  HEALTH MAINTENANCE: History  Substance Use Topics  . Smoking status: Never Smoker   . Smokeless tobacco: Never Used  . Alcohol Use:      Colonoscopy:  PAP:  Bone density:  Lipid panel:  No Known Allergies  Current Outpatient Prescriptions  Medication Sig Dispense Refill  . calcium carbonate (OS-CAL) 600 MG TABS Take 600 mg by mouth 2 (two) times daily with a meal.      . Cholecalciferol (VITAMIN D) 1000 UNITS capsule Take 1,000 Units by mouth daily.      Marland Kitchen KRILL OIL PO Take 1 tablet by mouth daily.      Marland Kitchen LYSINE HCL PO Take 1 tablet by mouth daily.      . Red Yeast Rice Extract (RED  YEAST RICE PO) Take 1 tablet by mouth daily.       No current facility-administered medications for this visit.    OBJECTIVE: Elderly white woman who appears well Filed Vitals:   06/16/12 0909  BP: 130/65  Pulse: 65  Temp: 98.3 F (36.8 C)  Resp: 20     Body mass index is 24.24 kg/(m^2).    ECOG FS:0 Filed Weights   06/16/12 0909  Weight: 136 lb 12.8 oz (62.052 kg)   Sclerae unicteric Oropharynx clear No peripheral adenopathy Lungs no rales or rhonchi Heart regular rate and rhythm Abdomen is soft, nontender,  with positive bowel sounds MSK no focal spinal tenderness, no peripheral edema Neuro: nonfocal, alert and oriented x3 Breasts: The right breast is status post lumpectomy. There is no evidence of local recurrence. The right axilla is benign Left breast is unremarkable.    LAB RESULTS: Lab Results  Component Value Date   WBC 5.3 06/08/2012   NEUTROABS 3.1 06/08/2012   HGB 13.3 06/08/2012   HCT 40.3 06/08/2012   MCV 95.4 06/08/2012   PLT 212 06/08/2012      Chemistry      Component Value Date/Time   NA 145 06/08/2012 0851   NA 142 06/11/2011 1433   K 4.3 06/08/2012 0851   K 3.9 06/11/2011 1433   CL 107 06/08/2012 0851   CL 107 06/11/2011 1433   CO2 29 06/08/2012 0851   CO2 24 06/11/2011 1433   BUN 14.3 06/08/2012 0851   BUN 13 06/11/2011 1433   CREATININE 0.7 06/08/2012 0851   CREATININE 0.65 06/11/2011 1433      Component Value Date/Time   CALCIUM 9.6 06/08/2012 0851   CALCIUM 9.2 06/11/2011 1433   ALKPHOS 47 06/08/2012 0851   ALKPHOS 49 06/11/2011 1433   AST 17 06/08/2012 0851   AST 19 06/11/2011 1433   ALT 17 06/08/2012 0851   ALT 15 06/11/2011 1433   BILITOT 0.67 06/08/2012 0851   BILITOT 0.6 06/11/2011 1433       Lab Results  Component Value Date   LABCA2 37 12/10/2011    STUDIES: Mm Digital Diagnostic Bilat  06/01/2012   *RADIOLOGY REPORT*  Clinical Data:  Right lumpectomy in 2012 for annual mammogram  DIGITAL DIAGNOSTIC RIGHT MAMMOGRAM WITH CAD  Comparison: 05/27/2011, 05/21/2010  Findings:  ACR Breast Density Category 2: There is a scattered fibroglandular pattern.  Left breast is unchanged and negative.  On the right, there is stable postsurgical scar posteriorly in the upper outer quadrant. There is no significant interval change and there are no suspicious findings.  Mammographic images were processed with CAD.  IMPRESSION: Stable benign postsurgical change on the right  BI-RADS CATEGORY 2:  Benign finding(s).  RECOMMENDATION: Diagnostic bilateral mammogram in 1 year.  I have  discussed the findings and recommendations with the patient. Results were also provided in writing at the conclusion of the visit.  If applicable, a reminder letter will be sent to the patient regarding her next appointment.   Original Report Authenticated By: Esperanza Heir, M.D.     ASSESSMENT: 79 y.o.  [place] woman   (1)  status post right lumpectomy June of 2012 for a T1b N0 grade 1 invasive ductal carcinoma, with abundant extracellular mucin, and no lymph node involvement (T1b, N0, or stage I), estrogen and progesterone receptor positive, Her-2 negative, with an Mib-1 of 4%. Margins were negative but close   (2) Status post radiation completed August of 2012.   (3)  No  adjuvant antiestrogens given.  PLAN:  Rebbecca is doing very well with regards to her breast cancer. We are going to start seeing her on a once a year basis now, until she completes her 5 years of followup. She knows to call for any problems that may develop before the next visit here.  MAGRINAT,GUSTAV C    06/16/2012

## 2012-06-22 ENCOUNTER — Other Ambulatory Visit (HOSPITAL_COMMUNITY): Payer: Self-pay | Admitting: Oncology

## 2012-06-22 DIAGNOSIS — C50919 Malignant neoplasm of unspecified site of unspecified female breast: Secondary | ICD-10-CM

## 2012-06-24 ENCOUNTER — Telehealth: Payer: Self-pay | Admitting: *Deleted

## 2012-06-24 NOTE — Telephone Encounter (Signed)
Returned pt called. gv appts for mammogram, lab, and ov. Pt is aware. i will be placing a letter/cal in the mail as well....td

## 2012-06-30 ENCOUNTER — Other Ambulatory Visit: Payer: Self-pay | Admitting: Emergency Medicine

## 2012-06-30 ENCOUNTER — Other Ambulatory Visit (HOSPITAL_COMMUNITY)
Admission: RE | Admit: 2012-06-30 | Discharge: 2012-06-30 | Disposition: A | Discharge status: Home / Self Care | Payer: Medicare Other | Source: Ambulatory Visit | Attending: Internal Medicine | Admitting: Internal Medicine

## 2012-06-30 DIAGNOSIS — Z124 Encounter for screening for malignant neoplasm of cervix: Secondary | ICD-10-CM | POA: Insufficient documentation

## 2012-07-08 ENCOUNTER — Other Ambulatory Visit: Payer: Self-pay | Admitting: Oncology

## 2012-07-08 ENCOUNTER — Encounter: Payer: Self-pay | Admitting: Oncology

## 2012-07-09 ENCOUNTER — Telehealth: Payer: Self-pay | Admitting: Oncology

## 2012-07-09 NOTE — Telephone Encounter (Signed)
Letter sent to Dr. Lucky Cowboy office from Dr. Darnelle Catalan

## 2012-07-10 ENCOUNTER — Telehealth: Payer: Self-pay | Admitting: *Deleted

## 2012-07-10 NOTE — Telephone Encounter (Signed)
Message left by pt stating she received a letter of denial for diagnostic mammogram and needs a letter for appeal.  Call transferred to [facility].

## 2012-07-16 ENCOUNTER — Encounter: Payer: Self-pay | Admitting: Oncology

## 2012-07-16 NOTE — Progress Notes (Signed)
Received an e-mail from Vanessa Barbara to follow up with Ms. Victoria Long regarding payment of recent lab test denied by insurance. Called patient, no answer. Left message to call me back.

## 2012-07-17 ENCOUNTER — Encounter: Payer: Self-pay | Admitting: Oncology

## 2012-07-17 NOTE — Progress Notes (Signed)
Called pt x2 [phone]) to discuss payment of recent lab test denied by insurance. No answer, left message to call me back.

## 2012-07-22 ENCOUNTER — Encounter: Payer: Self-pay | Admitting: Oncology

## 2012-07-22 NOTE — Progress Notes (Signed)
Called and left a message for the patient to call me back. She had left messages for [name]. She needs to get her eob to me that shows denial.

## 2012-07-27 ENCOUNTER — Encounter: Payer: Self-pay | Admitting: Oncology

## 2012-07-27 NOTE — Progress Notes (Signed)
Patient called to advise 06/08/12 was denied 25.00. I advised her would send to billing to review.

## 2012-08-10 ENCOUNTER — Encounter: Payer: Self-pay | Admitting: Oncology

## 2012-08-10 NOTE — Progress Notes (Signed)
I called the patient back and she said she got another denial for 12/10/11. See previous notes for the one 06/08/12. I advised I would forward to billing for review.

## 2012-12-14 ENCOUNTER — Other Ambulatory Visit: Payer: Self-pay | Admitting: Ophthalmology

## 2013-01-12 ENCOUNTER — Encounter: Payer: Self-pay | Admitting: Physician Assistant

## 2013-01-12 DIAGNOSIS — E785 Hyperlipidemia, unspecified: Secondary | ICD-10-CM

## 2013-01-12 DIAGNOSIS — E559 Vitamin D deficiency, unspecified: Secondary | ICD-10-CM

## 2013-01-12 DIAGNOSIS — R7309 Other abnormal glucose: Secondary | ICD-10-CM | POA: Insufficient documentation

## 2013-01-12 DIAGNOSIS — R7303 Prediabetes: Secondary | ICD-10-CM

## 2013-01-18 ENCOUNTER — Encounter: Payer: Self-pay | Admitting: Physician Assistant

## 2013-01-18 ENCOUNTER — Ambulatory Visit (INDEPENDENT_AMBULATORY_CARE_PROVIDER_SITE_OTHER): Payer: Medicare Other | Admitting: Physician Assistant

## 2013-01-18 VITALS — BP 122/64 | HR 72 | Temp 97.9°F | Resp 16 | Ht 63.0 in | Wt 133.0 lb

## 2013-01-18 DIAGNOSIS — Z79899 Other long term (current) drug therapy: Secondary | ICD-10-CM

## 2013-01-18 DIAGNOSIS — R7309 Other abnormal glucose: Secondary | ICD-10-CM

## 2013-01-18 DIAGNOSIS — E559 Vitamin D deficiency, unspecified: Secondary | ICD-10-CM

## 2013-01-18 DIAGNOSIS — R7303 Prediabetes: Secondary | ICD-10-CM

## 2013-01-18 DIAGNOSIS — H6123 Impacted cerumen, bilateral: Secondary | ICD-10-CM

## 2013-01-18 DIAGNOSIS — E785 Hyperlipidemia, unspecified: Secondary | ICD-10-CM

## 2013-01-18 DIAGNOSIS — E782 Mixed hyperlipidemia: Secondary | ICD-10-CM

## 2013-01-18 DIAGNOSIS — H6120 Impacted cerumen, unspecified ear: Secondary | ICD-10-CM

## 2013-01-18 LAB — CBC WITH DIFFERENTIAL/PLATELET
Basophils Relative: 0 % (ref 0–1)
Eosinophils Absolute: 0.1 10*3/uL (ref 0.0–0.7)
Eosinophils Relative: 2 % (ref 0–5)
HCT: 41.4 % (ref 36.0–46.0)
Hemoglobin: 14.1 g/dL (ref 12.0–15.0)
Lymphocytes Relative: 33 % (ref 12–46)
Lymphs Abs: 1.9 10*3/uL (ref 0.7–4.0)
MCH: 32.2 pg (ref 26.0–34.0)
MCHC: 34.1 g/dL (ref 30.0–36.0)
MCV: 94.5 fL (ref 78.0–100.0)
Monocytes Absolute: 0.6 10*3/uL (ref 0.1–1.0)
Monocytes Relative: 9 % (ref 3–12)
Neutro Abs: 3.2 10*3/uL (ref 1.7–7.7)
RBC: 4.38 MIL/uL (ref 3.87–5.11)

## 2013-01-18 LAB — HEPATIC FUNCTION PANEL
ALT: 16 U/L (ref 0–35)
AST: 19 U/L (ref 0–37)
Albumin: 4 g/dL (ref 3.5–5.2)
Total Protein: 6.6 g/dL (ref 6.0–8.3)

## 2013-01-18 LAB — BASIC METABOLIC PANEL WITH GFR
BUN: 16 mg/dL (ref 6–23)
CO2: 26 mEq/L (ref 19–32)
Calcium: 9.9 mg/dL (ref 8.4–10.5)
GFR, Est African American: >89 mL/min
Glucose, Bld: 96 mg/dL (ref 70–99)

## 2013-01-18 LAB — HEMOGLOBIN A1C
Hgb A1c MFr Bld: 5.8 % — ABNORMAL HIGH (ref <5.7)
Mean Plasma Glucose: 120 mg/dL — ABNORMAL HIGH (ref <117)

## 2013-01-18 LAB — LIPID PANEL
Cholesterol: 246 mg/dL — ABNORMAL HIGH (ref 0–200)
LDL Cholesterol: 165 mg/dL — ABNORMAL HIGH (ref 0–99)
Triglycerides: 90 mg/dL (ref <150)

## 2013-01-18 NOTE — Patient Instructions (Signed)
Cerumen Impaction A cerumen impaction is when the wax in your ear forms a plug. This plug usually causes reduced hearing. Sometimes it also causes an earache or dizziness. Removing a cerumen impaction can be difficult and painful. The wax sticks to the ear canal. The canal is sensitive and bleeds easily. If you try to remove a heavy wax buildup with a cotton tipped swab, you may push it in further. Irrigation with water, suction, and small ear curettes may be used to clear out the wax. If the impaction is fixed to the skin in the ear canal, ear drops may be needed for a few days to loosen the wax. People who build up a lot of wax frequently can use ear wax removal products available in your local drugstore. SEEK MEDICAL CARE IF:  You develop an earache, increased hearing loss, or marked dizziness.    Bad carbs also include fruit juice, alcohol, and sweet tea. These are empty calories that do not signal to your brain that you are full.   Please remember the good carbs are still carbs which convert into sugar. So please measure them out no more than 1/2-1 cup of rice, oatmeal, pasta, and beans.  Veggies are however free foods! Pile them on.   I like lean protein at every meal such as chicken, [place], pork chops, cottage cheese, etc. Just do not fry these meats and please center your meal around vegetable, the meats should be a side dish.   No all fruit is created equal. Please see the list below, the fruit at the bottom is higher in sugars than the fruit at the top

## 2013-01-18 NOTE — Progress Notes (Signed)
HPI Patient presents for 3 month follow up with hyperlipidemia, prediabetes and vitamin D. Patient's blood pressure has been controlled at home, today their BP is BP: 122/64 mmHg  Patient denies chest pain, shortness of breath, dizziness.  Patient's cholesterol is diet controlled. The cholesterol last visit was LDL 128 (136), trigs 162, HDL 60. The patient has been working on diet and exercise for prediabetes, and denies changes in vision, polys, and paresthesias. A1C 5.9 Patient is on Vitamin D supplement.  Vitamin D 92.  Patient states that she feels she may have wax in her ears, she has had it removed before and recently she has had some decreased hearing.  She has had a weakly positive RF in the past but states that her hands and shoulder are not really bothering her and would not like it rechecked at this time.  Current Medications:  Current Outpatient Prescriptions on File Prior to Visit  Medication Sig Dispense Refill  . calcium carbonate (OS-CAL) 600 MG TABS Take 600 mg by mouth 2 (two) times daily with a meal.      . Cholecalciferol (VITAMIN D) 1000 UNITS capsule Take 4,000 Units by mouth daily.       Marland Kitchen LYSINE HCL PO Take 1 tablet by mouth daily.      . Red Yeast Rice Extract (RED YEAST RICE PO) Take 1 tablet by mouth daily.       No current facility-administered medications on file prior to visit.   Medical History:  Past Medical History  Diagnosis Date  . Hyperlipidemia   . Cancer     right breast cancer  . Prediabetes   . Vitamin D deficiency   . Arthritis     false + RF, negative anticcp   Allergies: No Known Allergies  ROS Constitutional: Denies fever, chills, headaches, insomnia, fatigue, night sweats Eyes: Denies redness, blurred vision, diplopia, discharge, itchy, watery eyes.  ENT: + decreased hearing  Denies congestion, post nasal drip, sore throat, earache, dental pain, Tinnitus, Vertigo, Sinus pain, snoring.  Cardio: Denies chest pain, palpitations, irregular  heartbeat, dyspnea, diaphoresis, orthopnea, PND, claudication, edema Respiratory: denies cough, shortness of breath, wheezing.  Gastrointestinal: Denies dysphagia, heartburn, AB pain/ cramps, N/V, diarrhea, constipation, hematemesis, melena, hematochezia,  hemorrhoids Genitourinary: Denies dysuria, frequency, urgency, nocturia, hesitancy, discharge, hematuria, flank pain Musculoskeletal: Denies myalgia, stiffness, pain, swelling and strain/sprain. Skin: Denies pruritis, rash, changing in skin lesion Neuro: Denies Weakness, tremor, incoordination, spasms, pain Psychiatric: Denies confusion, memory loss, sensory loss Endocrine: Denies change in weight, skin, hair change, nocturia Diabetic Polys, Denies visual blurring, hyper /hypo glycemic episodes, and paresthesia, Heme/Lymph: Denies Excessive bleeding, bruising, enlarged lymph nodes  Family history- Review and unchanged Social history- Review and unchanged Physical Exam: Filed Vitals:   01/18/13 0947  BP: 122/64  Pulse: 72  Temp: 97.9 F (36.6 C)  Resp: 16   Filed Weights   01/18/13 0947  Weight: 133 lb (60.328 kg)   General Appearance: Well nourished, in no apparent distress. Eyes: PERRLA, EOMs, conjunctiva no swelling or erythema Sinuses: No Frontal/maxillary tenderness ENT/Mouth: Ext aud canals with bilateral cerumen impactions, TMs without erythema, bulging. No erythema, swelling, or exudate on post pharynx.  Tonsils not swollen or erythematous. Hearing normal.  Neck: Supple, thyroid normal.  Respiratory: Respiratory effort normal, BS equal bilaterally without rales, rhonchi, wheezing or stridor.  Cardio: RRR with no MRGs. Brisk peripheral pulses without edema.  Abdomen: Soft, + BS.  Non tender, no guarding, rebound, hernias, masses. Lymphatics: Non tender without lymphadenopathy.  Musculoskeletal: Full ROM, 5/5 strength, normal gait.  Skin: Warm, dry without rashes, lesions, ecchymosis.  Neuro: Cranial nerves intact. Normal  muscle tone, no cerebellar symptoms. Sensation intact.  Psych: Awake and oriented X 3, normal affect, Insight and Judgment appropriate.   Assessment and Plan:  Cholesterol: Continue diet and exercise. Check cholesterol.  Pre-diabetes-Continue diet and exercise. Check A1C Vitamin D Def- check level and continue medications.  Bilateral cermen impaction- taken out manually with an instrument, tolerated well.  Continue diet and meds as discussed. Further disposition pending results of labs.  Quentin Mulling 9:54 AM

## 2013-01-19 LAB — VITAMIN D 25 HYDROXY (VIT D DEFICIENCY, FRACTURES): Vit D, 25-Hydroxy: 101 ng/mL — ABNORMAL HIGH (ref 30–89)

## 2013-01-26 ENCOUNTER — Other Ambulatory Visit: Payer: Self-pay

## 2013-02-04 ENCOUNTER — Other Ambulatory Visit: Payer: Self-pay | Admitting: Dermatology

## 2013-04-19 ENCOUNTER — Encounter: Payer: Self-pay | Admitting: Internal Medicine

## 2013-04-19 ENCOUNTER — Ambulatory Visit (INDEPENDENT_AMBULATORY_CARE_PROVIDER_SITE_OTHER): Payer: Medicare Other | Admitting: Internal Medicine

## 2013-04-19 VITALS — BP 134/70 | HR 64 | Temp 97.0°F | Resp 16 | Ht 63.75 in | Wt 134.4 lb

## 2013-04-19 DIAGNOSIS — Z79899 Other long term (current) drug therapy: Secondary | ICD-10-CM

## 2013-04-19 DIAGNOSIS — E559 Vitamin D deficiency, unspecified: Secondary | ICD-10-CM

## 2013-04-19 DIAGNOSIS — E785 Hyperlipidemia, unspecified: Secondary | ICD-10-CM

## 2013-04-19 DIAGNOSIS — R7303 Prediabetes: Secondary | ICD-10-CM

## 2013-04-19 DIAGNOSIS — R7309 Other abnormal glucose: Secondary | ICD-10-CM

## 2013-04-19 LAB — CBC WITH DIFFERENTIAL/PLATELET
BASOS PCT: 0 % (ref 0–1)
Basophils Absolute: 0 10*3/uL (ref 0.0–0.1)
Eosinophils Absolute: 0.1 10*3/uL (ref 0.0–0.7)
Eosinophils Relative: 2 % (ref 0–5)
HCT: 39.1 % (ref 36.0–46.0)
HEMOGLOBIN: 13.1 g/dL (ref 12.0–15.0)
LYMPHS ABS: 1.8 10*3/uL (ref 0.7–4.0)
Lymphocytes Relative: 31 % (ref 12–46)
MCH: 31.4 pg (ref 26.0–34.0)
MCHC: 33.5 g/dL (ref 30.0–36.0)
MCV: 93.8 fL (ref 78.0–100.0)
MONOS PCT: 9 % (ref 3–12)
Monocytes Absolute: 0.5 10*3/uL (ref 0.1–1.0)
NEUTROS ABS: 3.4 10*3/uL (ref 1.7–7.7)
NEUTROS PCT: 58 % (ref 43–77)
PLATELETS: 272 10*3/uL (ref 150–400)
RBC: 4.17 MIL/uL (ref 3.87–5.11)
RDW: 13.3 % (ref 11.5–15.5)
WBC: 5.8 10*3/uL (ref 4.0–10.5)

## 2013-04-19 LAB — HEMOGLOBIN A1C
Hgb A1c MFr Bld: 5.9 % — ABNORMAL HIGH (ref <5.7)
Mean Plasma Glucose: 123 mg/dL — ABNORMAL HIGH (ref <117)

## 2013-04-19 NOTE — Patient Instructions (Signed)
Diabetes and Exercise Exercising regularly is important. It is not just about losing weight. It has many health benefits, such as:  Improving your overall fitness, flexibility, and endurance.  Increasing your bone density.  Helping with weight control.  Decreasing your body fat.  Increasing your muscle strength.  Reducing stress and tension.  Improving your overall health. People with diabetes who exercise gain additional benefits because exercise:  Reduces appetite.  Improves the body's use of blood sugar (glucose).  Helps lower or control blood glucose.  Decreases blood pressure.  Helps control blood lipids (such as cholesterol and triglycerides).  Improves the body's use of the hormone insulin by:  Increasing the body's insulin sensitivity.  Reducing the body's insulin needs.  Decreases the risk for heart disease because exercising:  Lowers cholesterol and triglycerides levels.  Increases the levels of good cholesterol (such as high-density lipoproteins [HDL]) in the body.  Lowers blood glucose levels. YOUR ACTIVITY PLAN  Choose an activity that you enjoy and set realistic goals. Your health care provider or diabetes educator can help you make an activity plan that works for you. You can break activities into 2 or 3 sessions throughout the day. Doing so is as good as one long session. Exercise ideas include:  Taking the dog for a walk.  Taking the stairs instead of the elevator.  Dancing to your favorite song.  Doing your favorite exercise with a friend. RECOMMENDATIONS FOR EXERCISING WITH TYPE 1 OR TYPE 2 DIABETES   Check your blood glucose before exercising. If blood glucose levels are greater than 240 mg/dL, check for urine ketones. Do not exercise if ketones are present.  Avoid injecting insulin into areas of the body that are going to be exercised. For example, avoid injecting insulin into:  The arms when playing tennis.  The legs when  jogging.  Keep a record of:  Food intake before and after you exercise.  Expected peak times of insulin action.  Blood glucose levels before and after you exercise.  The type and amount of exercise you have done.  Review your records with your health care provider. Your health care provider will help you to develop guidelines for adjusting food intake and insulin amounts before and after exercising.  If you take insulin or oral hypoglycemic agents, watch for signs and symptoms of hypoglycemia. They include:  Dizziness.  Shaking.  Sweating.  Chills.  Confusion.  Drink plenty of water while you exercise to prevent dehydration or heat stroke. Body water is lost during exercise and must be replaced.  Talk to your health care provider before starting an exercise program to make sure it is safe for you. Remember, almost any type of activity is better than none.    Cholesterol Cholesterol is a white, waxy, fat-like protein needed by your body in small amounts. The liver makes all the cholesterol you need. It is carried from the liver by the blood through the blood vessels. Deposits (plaque) may build up on blood vessel walls. This makes the arteries narrower and stiffer. Plaque increases the risk for heart attack and stroke. You cannot feel your cholesterol level even if it is very high. The only way to know is by a blood test to check your lipid (fats) levels. Once you know your cholesterol levels, you should keep a record of the test results. Work with your caregiver to to keep your levels in the desired range. WHAT THE RESULTS MEAN:  Total cholesterol is a rough measure of all  the cholesterol in your blood.  LDL is the so-called bad cholesterol. This is the type that deposits cholesterol in the walls of the arteries. You want this level to be low.  HDL is the good cholesterol because it cleans the arteries and carries the LDL away. You want this level to be high.  Triglycerides  are fat that the body can either burn for energy or store. High levels are closely linked to heart disease. DESIRED LEVELS:  Total cholesterol below 200.  LDL below 100 for people at risk, below 70 for very high risk.  HDL above 50 is good, above 60 is best.  Triglycerides below 150. HOW TO LOWER YOUR CHOLESTEROL:  Diet.  Choose fish or white meat chicken and [place], roasted or baked. Limit fatty cuts of red meat, fried foods, and processed meats, such as sausage and lunch meat.  Eat lots of fresh fruits and vegetables. Choose whole grains, beans, pasta, potatoes and cereals.  Use only small amounts of olive, corn or canola oils. Avoid butter, mayonnaise, shortening or palm kernel oils. Avoid foods with trans-fats.  Use skim/nonfat milk and low-fat/nonfat yogurt and cheeses. Avoid whole milk, cream, ice cream, egg yolks and cheeses. Healthy desserts include angel food cake, ginger snaps, animal crackers, hard candy, popsicles, and low-fat/nonfat frozen yogurt. Avoid pastries, cakes, pies and cookies.  Exercise.  A regular program helps decrease LDL and raises HDL.  Helps with weight control.  Do things that increase your activity level like gardening, walking, or taking the stairs.  Medication.  May be prescribed by your caregiver to help lowering cholesterol and the risk for heart disease.  You may need medicine even if your levels are normal if you have several risk factors. HOME CARE INSTRUCTIONS   Follow your diet and exercise programs as suggested by your caregiver.  Take medications as directed.  Have blood work done when your caregiver feels it is necessary. MAKE SURE YOU:   Understand these instructions.  Will watch your condition.  Will get help right away if you are not doing well or get worse.      Vitamin D Deficiency Vitamin D is an important vitamin that your body needs. Having too little of it in your body is called a deficiency. A very bad  deficiency can make your bones soft and can cause a condition called rickets.  Vitamin D is important to your body for different reasons, such as:   It helps your body absorb 2 minerals called calcium and phosphorus.  It helps make your bones healthy.  It may prevent some diseases, such as diabetes and multiple sclerosis.  It helps your muscles and heart. You can get vitamin D in several ways. It is a natural part of some foods. The vitamin is also added to some dairy products and cereals. Some people take vitamin D supplements. Also, your body makes vitamin D when you are in the sun. It changes the sun's rays into a form of the vitamin that your body can use. CAUSES   Not eating enough foods that contain vitamin D.  Not getting enough sunlight.  Having certain digestive system diseases that make it hard to absorb vitamin D. These diseases include Crohn's disease, chronic pancreatitis, and cystic fibrosis.  Having a surgery in which part of the stomach or small intestine is removed.  Being obese. Fat cells pull vitamin D out of your blood. That means that obese people may not have enough vitamin D left in their  blood and in other body tissues.  Having chronic kidney or liver disease. RISK FACTORS Risk factors are things that make you more likely to develop a vitamin D deficiency. They include:  Being older.  Not being able to get outside very much.  Living in a nursing home.  Having had broken bones.  Having weak or thin bones (osteoporosis).  Having a disease or condition that changes how your body absorbs vitamin D.  Having dark skin.  Some medicines such as seizure medicines or steroids.  Being overweight or obese. SYMPTOMS Mild cases of vitamin D deficiency may not have any symptoms. If you have a very bad case, symptoms may include:  Bone pain.  Muscle pain.  Falling often.  Broken bones caused by a minor injury, due to osteoporosis. DIAGNOSIS A blood test  is the best way to tell if you have a vitamin D deficiency. TREATMENT Vitamin D deficiency can be treated in different ways. Treatment for vitamin D deficiency depends on what is causing it. Options include:  Taking vitamin D supplements.  Taking a calcium supplement. Your caregiver will suggest what dose is best for you. HOME CARE INSTRUCTIONS  Take any supplements that your caregiver prescribes. Follow the directions carefully. Take only the suggested amount.  Have your blood tested 2 months after you start taking supplements.  Eat foods that contain vitamin D. Healthy choices include:  Fortified dairy products, cereals, or juices. Fortified means vitamin D has been added to the food. Check the label on the package to be sure.  Fatty fish like salmon or trout.  Eggs.  Oysters.  Do not use a tanning bed.  Keep your weight at a healthy level. Lose weight if you need to.  Keep all follow-up appointments. Your caregiver will need to perform blood tests to make sure your vitamin D deficiency is going away. SEEK MEDICAL CARE IF:  You have any questions about your treatment.  You continue to have symptoms of vitamin D deficiency.  You have nausea or vomiting.  You are constipated.  You feel confused.  You have severe abdominal or back pain. MAKE SURE YOU:  Understand these instructions.  Will watch your condition.  Will get help right away if you are not doing well or get worse.

## 2013-04-19 NOTE — Progress Notes (Signed)
Patient ID: Victoria Long, female   DOB: [birth year], 80 y.o.   MRN: [id number]    This very nice 80 y.o. female presents for 3 month follow up with  Hyperlipidemia, Pre-Diabetes and Vitamin D Deficiency.     Hyperlipidemia is not controlled with diet. Last Cholesterol was 246, Triglycerides were 90, HDL 63 and LDL 165. Patient denies myalgias or other med SE's.    Also, the patient has history of PreDiabetes with last A1c of 5.8% in Dec 2014. Patient denies any symptoms of reactive hypoglycemia, diabetic polys, paresthesias or visual blurring.   Further, Patient has history of Vitamin D Deficiency with last vitamin D of 101 in Dec 2014 and her dose was tapered. Patient supplements vitamin D without any suspected side-effects.  Medication Sig  . calcium carbonate (OS-CAL) 600 MG TABS Take 600 mg by mouth 2 (two) times daily with a meal.   . Cholecalciferol (VITAMIN D) 1000 UNITS capsule Take 1,000 Units by mouth 2 (two) times daily.   Marland Kitchen LYSINE HCL PO Take 500 mg by mouth daily.   . Omega-3 Fatty Acids (FISH OIL PO) Take by mouth daily. Mega Red   No Known Allergies  PMHx:   Past Medical History  Diagnosis Date  . Hyperlipidemia   . Cancer     right breast cancer  . Prediabetes   . Vitamin D deficiency   . Arthritis     false + RF, negative anticcp    FHx:    Reviewed / unchanged  SHx:    Reviewed / unchanged   Systems Review: Constitutional: Denies fever, chills, wt changes, headaches, insomnia, fatigue, night sweats, change in appetite. Eyes: Denies redness, blurred vision, diplopia, discharge, itchy, watery eyes.  ENT: Denies discharge, congestion, post nasal drip, epistaxis, sore throat, earache, hearing loss, dental pain, tinnitus, vertigo, sinus pain, snoring.  CV: Denies chest pain, palpitations, irregular heartbeat, syncope, dyspnea, diaphoresis, orthopnea, PND, claudication, edema. Respiratory: denies cough, dyspnea, DOE, pleurisy, hoarseness, laryngitis, wheezing.   Gastrointestinal: Denies dysphagia, odynophagia, heartburn, reflux, water brash, abdominal pain or cramps, nausea, vomiting, bloating, diarrhea, constipation, hematemesis, melena, hematochezia,  or hemorrhoids. Genitourinary: Denies dysuria, frequency, urgency, nocturia, hesitancy, discharge, hematuria, flank pain. Musculoskeletal: Denies arthralgias, myalgias, stiffness, jt. swelling, pain, limp, strain/sprain.  Skin: Denies pruritus, rash, hives, warts, acne, eczema, change in skin lesion(s). Neuro: No weakness, tremor, incoordination, spasms, paresthesia, or pain. Psychiatric: Denies confusion, memory loss, or sensory loss. Endo: Denies change in weight, skin, hair change.  Heme/Lymph: No excessive bleeding, bruising, orenlarged lymph nodes.   Exam:  BP 134/70  Pulse 64  Temp 97 F   Resp 16  Ht 5' 3.75"   Wt 134 lb 6.4 oz   BMI 23.26 kg/m2  Appears well nourished - in no distress. Eyes: PERRLA, EOMs, conjunctiva no swelling or erythema. Sinuses: No frontal/maxillary tenderness ENT/Mouth: EAC's clear, TM's nl w/o erythema, bulging. Nares clear w/o erythema, swelling, exudates. Oropharynx clear without erythema or exudates. Oral hygiene is good. Tongue normal, non obstructing. Hearing intact.  Neck: Supple. Thyroid nl. Car 2+/2+ without bruits, nodes or JVD. Chest: Respirations nl with BS clear & equal w/o rales, rhonchi, wheezing or stridor.  Cor: Heart sounds normal w/ regular rate and rhythm without sig. murmurs, gallops, clicks, or rubs. Peripheral pulses normal and equal  without edema.  Abdomen: Soft & bowel sounds normal. Non-tender w/o guarding, rebound, hernias, masses, or organomegaly.  Lymphatics: Unremarkable.  Musculoskeletal: Full ROM all peripheral extremities, joint stability, 5/5 strength, and normal gait.  Skin: Warm, dry without exposed rashes, lesions, ecchymosis apparent.  Neuro: Cranial nerves intact, reflexes equal bilaterally. Sensory-motor testing grossly  intact. Tendon reflexes grossly intact.  Pysch: Alert & oriented x 3. Insight and judgement nl & appropriate. No ideations.  Assessment and Plan:  1. Hyperlipidemia - Continue diet, exercise,& lifestyle modifications. Further disposition lab results.Continue monitor periodic cholesterol/liver & renal functions   2. Pre-diabetes - Continue diet, exercise, lifestyle modifications. Monitor appropriate labs.  3. Vitamin D Deficiency - Continue supplementation.  Recommended regular exercise, BP monitoring, weight control, and discussed med and SE's. Recommended labs to assess and monitor clinical status. Further disposition pending results of labs.

## 2013-04-20 ENCOUNTER — Other Ambulatory Visit: Payer: Self-pay | Admitting: Internal Medicine

## 2013-04-20 DIAGNOSIS — E782 Mixed hyperlipidemia: Secondary | ICD-10-CM

## 2013-04-20 LAB — LIPID PANEL
CHOLESTEROL: 238 mg/dL — AB (ref 0–200)
HDL: 70 mg/dL (ref >39)
LDL Cholesterol: 142 mg/dL — ABNORMAL HIGH (ref 0–99)
Total CHOL/HDL Ratio: 3.4 Ratio
Triglycerides: 130 mg/dL (ref <150)
VLDL: 26 mg/dL (ref 0–40)

## 2013-04-20 LAB — HEPATIC FUNCTION PANEL
ALT: 15 U/L (ref 0–35)
AST: 17 U/L (ref 0–37)
Albumin: 3.9 g/dL (ref 3.5–5.2)
Alkaline Phosphatase: 48 U/L (ref 39–117)
BILIRUBIN DIRECT: 0.1 mg/dL (ref 0.0–0.3)
Indirect Bilirubin: 0.5 mg/dL (ref 0.2–1.2)
Total Bilirubin: 0.6 mg/dL (ref 0.2–1.2)
Total Protein: 6.3 g/dL (ref 6.0–8.3)

## 2013-04-20 LAB — BASIC METABOLIC PANEL WITH GFR
BUN: 15 mg/dL (ref 6–23)
CALCIUM: 9.3 mg/dL (ref 8.4–10.5)
CO2: 25 meq/L (ref 19–32)
Chloride: 104 mEq/L (ref 96–112)
Creat: 0.55 mg/dL (ref 0.50–1.10)
GFR, Est Non African American: 87 mL/min
Glucose, Bld: 91 mg/dL (ref 70–99)
POTASSIUM: 4 meq/L (ref 3.5–5.3)
SODIUM: 141 meq/L (ref 135–145)

## 2013-04-20 LAB — INSULIN, FASTING: INSULIN FASTING, SERUM: 9 u[IU]/mL (ref 3–28)

## 2013-04-20 LAB — MAGNESIUM: MAGNESIUM: 1.9 mg/dL (ref 1.5–2.5)

## 2013-04-20 LAB — TSH: TSH: 1.874 u[IU]/mL (ref 0.350–4.500)

## 2013-04-20 LAB — VITAMIN D 25 HYDROXY (VIT D DEFICIENCY, FRACTURES): Vit D, 25-Hydroxy: 77 ng/mL (ref 30–89)

## 2013-04-20 MED ORDER — ATORVASTATIN CALCIUM 80 MG PO TABS: ORAL_TABLET | ORAL | Status: DC

## 2013-05-14 ENCOUNTER — Ambulatory Visit (INDEPENDENT_AMBULATORY_CARE_PROVIDER_SITE_OTHER): Payer: Medicare Other | Admitting: Internal Medicine

## 2013-05-14 ENCOUNTER — Encounter: Payer: Self-pay | Admitting: Internal Medicine

## 2013-05-14 VITALS — BP 126/76 | HR 68 | Temp 98.2°F | Resp 16 | Ht 63.75 in | Wt 131.8 lb

## 2013-05-14 DIAGNOSIS — M940 Chondrocostal junction syndrome [Tietze]: Secondary | ICD-10-CM

## 2013-05-14 MED ORDER — PREDNISONE 20 MG PO TABS: 20.0000 mg | ORAL_TABLET | ORAL | Status: DC

## 2013-05-14 NOTE — Progress Notes (Signed)
   Subjective:    Patient ID: Victoria Long, female    DOB: July 31, 1928, 80 y.o.   MRN: [id number]  HPI Very nice spry 80 yo WF presenting with right sided axillary chest pain since yesterday which followed working in the yard the day before with a hoe, etc. No positional or pleuritic component, dyspnea, fever , cough or congestion.  Current Outpatient Prescriptions on File Prior to Visit  Medication Sig Dispense Refill  . aspirin 81 MG tablet Take 81 mg by mouth daily.      Marland Kitchen atorvastatin (LIPITOR) 80 MG tablet 1/2 to 1 tablet daily or as directed for cholesterol  30 tablet  99  . calcium carbonate (OS-CAL) 600 MG TABS Take 600 mg by mouth 2 (two) times daily with a meal.       . Cholecalciferol (VITAMIN D) 1000 UNITS capsule Take 1,000 Units by mouth 2 (two) times daily.       Marland Kitchen LYSINE HCL PO Take 500 mg by mouth daily.       . Multiple Vitamins-Minerals (MULTIVITAMIN PO) Take by mouth daily.      . Omega-3 Fatty Acids (FISH OIL PO) Take by mouth daily. Mega Red       No current facility-administered medications on file prior to visit.   No Known Allergies Past Medical History  Diagnosis Date  . Hyperlipidemia   . Cancer     right breast cancer  . Prediabetes   . Vitamin D deficiency   . Arthritis     false + RF, negative anticcp   Review of Systems In addition to the HPI above,  No Fever-chills,  No Headache, No changes with Vision or hearing,  No problems swallowing food or Liquids,  No productive Cough or Shortness of Breath,  No Abdominal pain, No Nausea or Vommitting, Bowel movements are regular,  No Blood in stool or Urine,  No dysuria,  No new skin rashes or bruises,  No new joints pains-aches,  No new weakness, tingling, numbness in any extremity,  No recent weight loss,  No polyuria, polydypsia or polyphagia,  No significant Mental Stressors.  A full 10 point Review of Systems was done, except as stated above, all other Review of Systems were  negative  Objective:   Physical Exam  BP 126/76  Pulse 68  Temp(Src) 98.2 F (36.8 C) (Temporal)  Resp 16  Ht 5' 3.75" (1.619 m)  Wt 131 lb 12.8 oz (59.784 kg)  BMI 22.81 kg/m2 HEENT - Eac's patent. TM's Nl.EOM's full. PERRLA. NasoOroPharynx clear. Neck - supple. Nl Thyroid. No bruits nodes JVD Chest - Clear equal BS. (+) tender Rt axillary ribs. Cor - Nl HS. RRR w/o sig MGR. PP 1(+) No edema. Abd - No palpable organomegaly, masses or tenderness. BS nl. MS- FROM. w/o deformities. Muscle power tone and bulk Nl. Gait Nl. Neuro - No obvious Cr N abnormalities. Sensory, motor and Cerebellar functions appear Nl w/o focal abnormalities.  Assessment & Plan:   1. Costochondritis  - predniSONE (DELTASONE) 20 MG tablet; Take 1 tablet (20 mg total) by mouth See admin instructions. 1 tab 3 x day for 3 days, then 1 tab 2 x day for 3 days, then 1 tab 1 x day for 5 days  Dispense: 20 tablet; Refill: 0 - Recc try heating pad.

## 2013-05-14 NOTE — Patient Instructions (Signed)

## 2013-05-24 ENCOUNTER — Telehealth: Payer: Self-pay | Admitting: Oncology

## 2013-05-24 NOTE — Telephone Encounter (Signed)
pt cld to see why her appt was resch/adv gm moved appt to Oct. Adv f/u-Reminded pt of date & time

## 2013-06-01 ENCOUNTER — Telehealth: Payer: Self-pay | Admitting: Oncology

## 2013-06-01 NOTE — Telephone Encounter (Signed)
per GM to move pt appt to Oct-cld and left pt a message of new time & date-will mail copy of sch to pt

## 2013-06-04 ENCOUNTER — Ambulatory Visit
Admission: RE | Admit: 2013-06-04 | Discharge: 2013-06-04 | Disposition: A | Discharge status: Home / Self Care | Payer: Medicare Other | Source: Ambulatory Visit | Attending: Oncology | Admitting: Oncology

## 2013-06-04 DIAGNOSIS — C50919 Malignant neoplasm of unspecified site of unspecified female breast: Secondary | ICD-10-CM

## 2013-06-08 ENCOUNTER — Other Ambulatory Visit: Payer: Medicare Other

## 2013-06-11 ENCOUNTER — Other Ambulatory Visit: Payer: Medicare Other

## 2013-06-15 ENCOUNTER — Ambulatory Visit: Payer: Medicare Other | Admitting: Oncology

## 2013-07-08 ENCOUNTER — Encounter: Payer: Self-pay | Admitting: Emergency Medicine

## 2013-07-27 ENCOUNTER — Encounter: Payer: Self-pay | Admitting: Emergency Medicine

## 2013-07-27 ENCOUNTER — Ambulatory Visit (INDEPENDENT_AMBULATORY_CARE_PROVIDER_SITE_OTHER): Payer: Medicare Other | Admitting: Emergency Medicine

## 2013-07-27 VITALS — BP 138/90 | HR 58 | Temp 98.4°F | Resp 16 | Ht 64.0 in | Wt 136.0 lb

## 2013-07-27 DIAGNOSIS — Z0000 Encounter for general adult medical examination without abnormal findings: Secondary | ICD-10-CM

## 2013-07-27 DIAGNOSIS — R2241 Localized swelling, mass and lump, right lower limb: Secondary | ICD-10-CM

## 2013-07-27 DIAGNOSIS — H6120 Impacted cerumen, unspecified ear: Secondary | ICD-10-CM

## 2013-07-27 DIAGNOSIS — Z789 Other specified health status: Secondary | ICD-10-CM

## 2013-07-27 DIAGNOSIS — R7309 Other abnormal glucose: Secondary | ICD-10-CM

## 2013-07-27 DIAGNOSIS — R5381 Other malaise: Secondary | ICD-10-CM

## 2013-07-27 DIAGNOSIS — Z1331 Encounter for screening for depression: Secondary | ICD-10-CM

## 2013-07-27 DIAGNOSIS — Z23 Encounter for immunization: Secondary | ICD-10-CM

## 2013-07-27 DIAGNOSIS — R030 Elevated blood-pressure reading, without diagnosis of hypertension: Secondary | ICD-10-CM

## 2013-07-27 DIAGNOSIS — R5383 Other fatigue: Secondary | ICD-10-CM

## 2013-07-27 DIAGNOSIS — E782 Mixed hyperlipidemia: Secondary | ICD-10-CM

## 2013-07-27 DIAGNOSIS — E559 Vitamin D deficiency, unspecified: Secondary | ICD-10-CM

## 2013-07-27 DIAGNOSIS — H6123 Impacted cerumen, bilateral: Secondary | ICD-10-CM

## 2013-07-27 LAB — CBC WITH DIFFERENTIAL/PLATELET
BASOS ABS: 0 10*3/uL (ref 0.0–0.1)
Basophils Relative: 0 % (ref 0–1)
EOS ABS: 0.1 10*3/uL (ref 0.0–0.7)
Eosinophils Relative: 1 % (ref 0–5)
HCT: 37.1 % (ref 36.0–46.0)
Hemoglobin: 12.7 g/dL (ref 12.0–15.0)
Lymphocytes Relative: 27 % (ref 12–46)
Lymphs Abs: 1.7 10*3/uL (ref 0.7–4.0)
MCH: 31.7 pg (ref 26.0–34.0)
MCHC: 34.2 g/dL (ref 30.0–36.0)
MCV: 92.5 fL (ref 78.0–100.0)
MONOS PCT: 10 % (ref 3–12)
Monocytes Absolute: 0.6 10*3/uL (ref 0.1–1.0)
NEUTROS PCT: 62 % (ref 43–77)
Neutro Abs: 4 10*3/uL (ref 1.7–7.7)
PLATELETS: 221 10*3/uL (ref 150–400)
RBC: 4.01 MIL/uL (ref 3.87–5.11)
RDW: 13.3 % (ref 11.5–15.5)
WBC: 6.4 10*3/uL (ref 4.0–10.5)

## 2013-07-27 LAB — HEMOGLOBIN A1C
Hgb A1c MFr Bld: 5.9 % — ABNORMAL HIGH (ref <5.7)
Mean Plasma Glucose: 123 mg/dL — ABNORMAL HIGH (ref <117)

## 2013-07-27 NOTE — Progress Notes (Signed)
Patient ID: Victoria Long, female   DOB: [birth year], 80 y.o.   MRN: [id number] MEDICARE ANNUAL WELLNESS VISIT AND CPE  Assessment:  1. CPE/ medicare wellness update- Update screening labs/ History/ Immunizations/ Testing as needed. Advised healthy diet, QD exercise, increase H20 and continue RX/ Vitamins AD.  2.  Irreg Nevi- monitor for any change, call if occurs for removal   3. 3 month F/U for HTN, Cholesterol, Pre-Dm, D. Deficient. Needs healthy diet, cardio QD and obtain healthy weight. Check Labs, Check BP if >130/80 call office   4. ? Lipoma- Will continue to monitor w/c when ready for scan of right thigh lump  5. Fatigue- check labs, increase activity and H2O   6. Cerumen impaction bilateral- Lavage performed with good results   Plan:   During the course of the visit the patient was educated and counseled about appropriate screening and preventive services including:    Pneumococcal vaccine   Influenza vaccine  Td vaccine  Screening electrocardiogram  Screening mammography  Bone densitometry screening  Colorectal cancer screening  Diabetes screening  Glaucoma screening  Nutrition counseling   Advanced directives: given information/requested  Screening recommendations, referrals:  Vaccinations: Tdap vaccine not indicated Influenza vaccine not indicated Pneumococcal vaccine not indicated Shingles vaccine not indicated Hep B vaccine not indicated  Nutrition assessed and recommended  Colonoscopy declined Mammogram not indicated Pap smear not indicated Pelvic exam not indicated Recommended yearly ophthalmology/optometry visit for glaucoma screening and checkup Recommended yearly dental visit for hygiene and checkup Advanced directives - requested  Conditions/risks identified: BMI: Discussed weight loss, diet, and increase physical activity.  Increase physical activity: AHA recommends 150 minutes of physical activity a week.  Medications reviewed DEXA-  not indicated Diabetes at goal, ACE/ARB therapy No, Reason not on Ace Inhibitor/ARB therapy:  not indicated Urinary Incontinence is not an issue: discussed non pharmacology and pharmacology options.  Fall risk: low- discussed PT, home fall assessment, medications.   Subjective:   Victoria Long is a 80 y.o. female who presents for Medicare Annual Wellness Visit and complete physical.    Date of last medicare wellness visit is unknown.  She has been diagnosed with Melanoma last year and has 3 month f/u x 1 year with 2nd f/u negative.   She has noticed right upper thigh with fullness x several months. She denies pain/ injury. She notes other family members have unusual lumps on their extremities as well   Her blood pressure has been controlled at home, today their BP is BP: 138/90 mmHg She does workout. She denies chest pain, shortness of breath, dizziness.  She is on cholesterol medication and denies myalgias. Her cholesterol is at goal. The cholesterol last visit was:   Lab Results  Component Value Date   CHOL 164 07/27/2013   HDL 70 07/27/2013   LDLCALC 78 07/27/2013   TRIG 79 07/27/2013   CHOLHDL 2.3 07/27/2013   She has been working on diet and exercise for  prediabetes, and denies polydipsia and visual disturbances. Last A1C in the office was:  Lab Results  Component Value Date   HGBA1C 5.9* 07/27/2013   Patient is on Vitamin D supplement.   Lab Results  Component Value Date   VD25OH 83 07/27/2013       Names of Other Physician/Practitioners you currently use: Patient Care Team: Victoria Pinto, MD as PCP - General (Internal Medicine) Victoria Mayer, MD as Consulting Physician (Gastroenterology) Victoria Pho, MD as Referring Physician (Orthopedic Surgery) Victoria Orion, MD  as Consulting Physician (Dermatology) Victoria Cruel, MD as Consulting Physician (Oncology) Victoria Come. Sarajane Jews, MD as Consulting Physician (Dermatology)   Medication Review Current Outpatient  Prescriptions on File Prior to Visit  Medication Sig Dispense Refill  . aspirin 81 MG tablet Take 81 mg by mouth daily.      Marland Kitchen atorvastatin (LIPITOR) 80 MG tablet 1/2 to 1 tablet daily or as directed for cholesterol  30 tablet  99  . calcium carbonate (OS-CAL) 600 MG TABS Take 600 mg by mouth 2 (two) times daily with a meal.       . Cholecalciferol (VITAMIN D) 1000 UNITS capsule Take 1,000 Units by mouth 2 (two) times daily.       Marland Kitchen LYSINE HCL PO Take 500 mg by mouth daily.       . Multiple Vitamins-Minerals (MULTIVITAMIN PO) Take by mouth daily.      . Omega-3 Fatty Acids (FISH OIL PO) Take by mouth daily. Mega Red       No current facility-administered medications on file prior to visit.   No Known Allergies   Current Problems (verified) Patient Active Problem List   Diagnosis Date Noted  . Encounter for long-term (current) use of other medications 04/19/2013  . Hyperlipidemia   . Prediabetes   . Vitamin D deficiency   . Breast cancer 06/11/2011    Screening Tests Health Maintenance  Topic Date Due  . Colonoscopy  02/13/2012  . Influenza Vaccine  08/21/2013  . Tetanus/tdap  02/13/2019  . Pneumococcal Polysaccharide Vaccine Age 39 And Over  Completed  . Zostavax  Completed    Immunization History  Administered Date(s) Administered  . Influenza-Unspecified 11/10/2012  . Pneumococcal Conjugate-13 07/27/2013  . Pneumococcal-Unspecified 11/30/2010  . Td 02/12/2009  . Zoster 02/12/2006    Preventative care: Last colonoscopy: 2004 Last mammogram: 05/1513/ Last pap smear/pelvic exam: 06/2012 DEXA:2011 wnl EYE: 11/2012 Stable   Prior vaccinations: TD or Tdap: 2011  Influenza: 2014 Pneumococcal: 2012 Shingles/Zostavax: 2008  Past Medical History  Diagnosis Date  . Hyperlipidemia   . Cancer     right breast cancer  . Prediabetes   . Vitamin D deficiency   . Arthritis     false + RF, negative anticcp  . Melanoma 2014    right humerus   Past Surgical History   Procedure Laterality Date  . Breast lumpectomy Right 2012    with radiation no chemo  . Cataract extraction Bilateral 2009  . Cesarean section    . Melanoma excision Right 2014    humerus    History  Substance Use Topics  . Smoking status: Never Smoker   . Smokeless tobacco: Never Used  . Alcohol Use: 2.0 oz/week    4 drink(s) per week     Comment: wine   Family History  Problem Relation Age of Onset  . Arthritis Mother   . Rheum arthritis Mother   . Hypertension Sister   . Cancer Sister   . Diabetes Maternal Grandmother      Risk Factors: Osteoporosis: postmenopausal estrogen deficiency History of fracture in the past year: no  Tobacco History  Substance Use Topics  . Smoking status: Never Smoker   . Smokeless tobacco: Never Used  . Alcohol Use: 2.0 oz/week    4 drink(s) per week     Comment: wine   She does not smoke.  Patient is not a former smoker. Are there smokers in your home (other than you)?  No  Alcohol Current alcohol use:  social drinker  Caffeine Current caffeine use: denies use  Exercise  Current exercise: housecleaning and walking  Nutrition/Diet Current diet: in general, a "healthy" diet    Cardiac risk factors: none.  Depression Screen (Note: if answer to either of the following is "Yes", a more complete depression screening is indicated)   Q1: Over the past two weeks, have you felt down, depressed or hopeless? No  Q2: Over the past two weeks, have you felt little interest or pleasure in doing things? No  Have you lost interest or pleasure in daily life? No  Do you often feel hopeless? No  Do you cry easily over simple problems? No  Activities of Daily Living In your present state of health, do you have any difficulty performing the following activities?:  Driving? No Managing money?  No Feeding yourself? No Getting from bed to chair? No Climbing a flight of stairs? No Preparing food and eating?: No Bathing or showering?  No Getting dressed: No Getting to the toilet? No Using the toilet:No Moving around from place to place: No In the past year have you fallen or had a near fall?:No   Are you sexually active?  No  Do you have more than one partner?  No  Vision Difficulties: No  Hearing Difficulties: No Do you often ask people to speak up or repeat themselves? No Do you experience ringing or noises in your ears? No Do you have difficulty understanding soft or whispered voices? No  Cognition  Do you feel that you have a problem with memory?No  Do you often misplace items? No  Do you feel safe at home?  Yes  Advanced directives Does patient have a [place]? No Does patient have a Living Will? No   Objective:     Blood pressure 138/90, pulse 58, temperature 98.4 F (36.9 C), temperature source Temporal, resp. rate 16, height 5\' 4"  (1.626 m), weight 136 lb (61.689 kg). Body mass index is 23.33 kg/(m^2).  General appearance: alert, no distress, WD/WN,  female Cognitive Testing  Alert? Yes  Normal Appearance?Yes  Oriented to person? Yes  Place? Yes   Time? Yes  Recall of three objects?  Yes  Can perform simple calculations? Yes  Displays appropriate judgment?Yes  Can read the correct time from a watch face?Yes  HEENT: normocephalic, sclerae anicteric, TMs pearly after lavage performed for bilateral cerumen oobstruction, nares patent, no discharge or erythema, pharynx normal Oral cavity: MMM, no lesions Neck: supple, no lymphadenopathy, no thyromegaly, no masses Heart: RRR, normal S1, S2, no murmurs Lungs: CTA bilaterally, no wheezes, rhonchi, or rales Abdomen: +bs, soft, non tender, non distended, no masses, no hepatomegaly, no splenomegaly Musculoskeletal: nontender, no swelling, no obvious deformity Extremities: no edema, no cyanosis, no clubbing Pulses: 2+ symmetric, upper and lower extremities, normal cap refill Neurological: alert, oriented x 3, CN2-12 intact,  strength normal upper extremities and lower extremities, sensation normal throughout, DTRs 2+ throughout, no cerebellar signs, gait normal Skin: Left 3rd finger, Left elbow, right neck - all flat med dark brown 2-4 mm nevi Psychiatric: normal affect, behavior normal, pleasant  Breast:  nontender, no masses or lumps, no skin changes, right nipple no change with inversion, no axillary lymphadenopathy, + fibrocystic changes Gyn: defer  Rectal: defer   AORTA SCAN WNL EKG NSCSPT Rsr'    Medicare Attestation I have personally reviewed: The patient's medical and social history Their use of alcohol, tobacco or illicit drugs Their current medications and supplements The patient's  functional ability including ADLs,fall risks, home safety risks, cognitive, and hearing and visual impairment Diet and physical activities Evidence for depression or mood disorders  The patient's weight, height, BMI, and visual acuity have been recorded in the chart.  I have made referrals, counseling, and provided education to the patient based on review of the above and I have provided the patient with a written personalized care plan for preventive services.     [address] Mycah Formica, R, PA-C   07/29/2013

## 2013-07-27 NOTE — Patient Instructions (Signed)
Cerumen Impaction The structures of the external ear canal secrete a waxy substance known as cerumen. Excess cerumen can build up in the ear canal, causing a condition known as cerumen impaction. Cerumen impaction can cause ear pain as well as disrupt the function of the ear. The rate of cerumen production differs for each individual. For certain individuals, the configuration of one's ear canal may cause him or her to have a decreased ability to naturally remove cerumen. It is important to note that removing cerumen as a part of normal hygiene is not necessary, and the use of swabs in the ear canal is not recommended. SYMPTOMS   Diminished hearing.  Ear drainage.  Ear pain.  Ear itch. CAUSES  Excessive cerumen production.  RISK INCREASES WITH:  Frequent use of swabs to clean ears.  Narrow ear canals.  Eczema (a skin condition).  Dehydration. PREVENTION  Do Not insert objects into the ear, even with the intent of cleaning the ear.  Maintain hydration.  Control eczema if present. TREATMENT  Maintaining preventative measures is the best way to treat cerumen impaction. If symptoms of cerumen impaction develop the first step is to use over-the-counter or prescription ear drops that are intended to soften the cerumen. If the cerumen does not clear, then visit your caregiver to have the cerumen removed. The most common method for cerumen removal is through irrigation with warm water. Although, some caregivers use ear curettes and other instruments to remove the cerumen physically. In the most severe cases, cerumen may be removed surgically. Document Released: 01/07/2005 Document Revised: 04/01/2011 Document Reviewed: 04/21/2008 [facility] Patient Information 2015 [place], [place]. This information is not intended to replace advice given to you by your health care provider. Make sure you discuss any questions you have with your health care provider. Pneumococcal Vaccine, Polyvalent suspension  for injection What is this medicine? PNEUMOCOCCAL VACCINE, POLYVALENT (NEU mo KOK al vak SEEN, pol ee VEY luhnt) is a vaccine to prevent pneumococcus bacteria infection. These bacteria are a major cause of ear infections, 'Strep throat' infections, and serious pneumonia, meningitis, or blood infections worldwide. These vaccines help the body to produce antibodies (protective substances) that help your body defend against these bacteria. This vaccine is recommended for infants and young children. This vaccine will not treat an infection. This medicine may be used for other purposes; ask your health care provider or pharmacist if you have questions. COMMON BRAND NAME(S): Prevnar 13 What should I tell my health care provider before I take this medicine? They need to know if you have any of these conditions: -bleeding problems -fever -immune system problems -low platelet count in the blood -seizures -an unusual or allergic reaction to pneumococcal vaccine, diphtheria toxoid, other vaccines, latex, other medicines, foods, dyes, or preservatives -pregnant or trying to get pregnant -breast-feeding How should I use this medicine? This vaccine is for injection into a muscle. It is given by a health care professional. A copy of Vaccine Information Statements will be given before each vaccination. Read this sheet carefully each time. The sheet may change frequently. Talk to your pediatrician regarding the use of this medicine in children. While this drug may be prescribed for children as young as 22 weeks old for selected conditions, precautions do apply. Overdosage: If you think you have taken too much of this medicine contact a poison control center or emergency room at once. NOTE: This medicine is only for you. Do not share this medicine with others. What if I miss a dose?  It is important not to miss your dose. Call your doctor or health care professional if you are unable to keep an appointment. What  may interact with this medicine? -medicines for cancer chemotherapy -medicines that suppress your immune function -medicines that treat or prevent blood clots like warfarin, enoxaparin, and dalteparin -steroid medicines like prednisone or cortisone This list may not describe all possible interactions. Give your health care provider a list of all the medicines, herbs, non-prescription drugs, or dietary supplements you use. Also tell them if you smoke, drink alcohol, or use illegal drugs. Some items may interact with your medicine. What should I watch for while using this medicine? Mild fever and pain should go away in 3 days or less. Report any unusual symptoms to your doctor or health care professional. What side effects may I notice from receiving this medicine? Side effects that you should report to your doctor or health care professional as soon as possible: -allergic reactions like skin rash, itching or hives, swelling of the face, lips, or tongue -breathing problems -confused -fever over 102 degrees F -pain, tingling, numbness in the hands or feet -seizures -unusual bleeding or bruising -unusual muscle weakness Side effects that usually do not require medical attention (report to your doctor or health care professional if they continue or are bothersome): -aches and pains -diarrhea -fever of 102 degrees F or less -headache -irritable -loss of appetite -pain, tender at site where injected -trouble sleeping This list may not describe all possible side effects. Call your doctor for medical advice about side effects. You may report side effects to FDA at 1-800-FDA-1088. Where should I keep my medicine? This does not apply. This vaccine is given in a clinic, pharmacy, doctor's office, or other health care setting and will not be stored at home. NOTE: This sheet is a summary. It may not cover all possible information. If you have questions about this medicine, talk to your doctor,  pharmacist, or health care provider.  2015, [employer] Standard. (2008-03-22 10:17:22)

## 2013-07-28 LAB — URINALYSIS, ROUTINE W REFLEX MICROSCOPIC
BILIRUBIN URINE: NEGATIVE
Glucose, UA: NEGATIVE mg/dL
Hgb urine dipstick: NEGATIVE
Ketones, ur: NEGATIVE mg/dL
Leukocytes, UA: NEGATIVE
Nitrite: NEGATIVE
Protein, ur: NEGATIVE mg/dL
Specific Gravity, Urine: 1.013 (ref 1.005–1.030)
Urobilinogen, UA: 0.2 mg/dL (ref 0.0–1.0)
pH: 7 (ref 5.0–8.0)

## 2013-07-28 LAB — BASIC METABOLIC PANEL WITH GFR
BUN: 14 mg/dL (ref 6–23)
CHLORIDE: 105 meq/L (ref 96–112)
CO2: 26 mEq/L (ref 19–32)
Calcium: 9.5 mg/dL (ref 8.4–10.5)
Creat: 0.53 mg/dL (ref 0.50–1.10)
GFR, Est African American: >89 mL/min
GFR, Est Non African American: 88 mL/min
GLUCOSE: 89 mg/dL (ref 70–99)
Potassium: 4.1 mEq/L (ref 3.5–5.3)
Sodium: 141 mEq/L (ref 135–145)

## 2013-07-28 LAB — VITAMIN D 25 HYDROXY (VIT D DEFICIENCY, FRACTURES): Vit D, 25-Hydroxy: 85 ng/mL (ref 30–89)

## 2013-07-28 LAB — LIPID PANEL
CHOLESTEROL: 164 mg/dL (ref 0–200)
HDL: 70 mg/dL (ref >39)
LDL Cholesterol: 78 mg/dL (ref 0–99)
TRIGLYCERIDES: 79 mg/dL (ref <150)
Total CHOL/HDL Ratio: 2.3 Ratio
VLDL: 16 mg/dL (ref 0–40)

## 2013-07-28 LAB — HEPATIC FUNCTION PANEL
ALT: 17 U/L (ref 0–35)
AST: 19 U/L (ref 0–37)
Albumin: 3.8 g/dL (ref 3.5–5.2)
Alkaline Phosphatase: 48 U/L (ref 39–117)
Bilirubin, Direct: 0.1 mg/dL (ref 0.0–0.3)
Indirect Bilirubin: 0.5 mg/dL (ref 0.2–1.2)
TOTAL PROTEIN: 6.2 g/dL (ref 6.0–8.3)
Total Bilirubin: 0.6 mg/dL (ref 0.2–1.2)

## 2013-07-28 LAB — MICROALBUMIN / CREATININE URINE RATIO
Creatinine, Urine: 55.7 mg/dL
MICROALB UR: 0.5 mg/dL (ref 0.00–1.89)
Microalb Creat Ratio: 9 mg/g (ref 0.0–30.0)

## 2013-07-28 LAB — TSH: TSH: 1.5 u[IU]/mL (ref 0.350–4.500)

## 2013-07-28 LAB — INSULIN, FASTING: Insulin fasting, serum: 6 u[IU]/mL (ref 3–28)

## 2013-07-28 LAB — MAGNESIUM: Magnesium: 1.9 mg/dL (ref 1.5–2.5)

## 2013-07-29 ENCOUNTER — Encounter: Payer: Self-pay | Admitting: Emergency Medicine

## 2013-08-01 ENCOUNTER — Encounter: Payer: Self-pay | Admitting: Emergency Medicine

## 2013-08-03 ENCOUNTER — Encounter: Payer: Self-pay | Admitting: Emergency Medicine

## 2013-10-22 ENCOUNTER — Other Ambulatory Visit: Payer: Self-pay | Admitting: *Deleted

## 2013-10-22 DIAGNOSIS — C50919 Malignant neoplasm of unspecified site of unspecified female breast: Secondary | ICD-10-CM

## 2013-10-25 ENCOUNTER — Telehealth: Payer: Self-pay | Admitting: Oncology

## 2013-10-25 ENCOUNTER — Other Ambulatory Visit (HOSPITAL_BASED_OUTPATIENT_CLINIC_OR_DEPARTMENT_OTHER): Payer: Medicare Other

## 2013-10-25 ENCOUNTER — Ambulatory Visit (HOSPITAL_BASED_OUTPATIENT_CLINIC_OR_DEPARTMENT_OTHER): Payer: Medicare Other | Admitting: Oncology

## 2013-10-25 VITALS — BP 117/99 | HR 69 | Temp 97.4°F | Resp 18 | Ht 64.0 in | Wt 135.3 lb

## 2013-10-25 DIAGNOSIS — C50511 Malignant neoplasm of lower-outer quadrant of right female breast: Secondary | ICD-10-CM

## 2013-10-25 DIAGNOSIS — C50919 Malignant neoplasm of unspecified site of unspecified female breast: Secondary | ICD-10-CM

## 2013-10-25 DIAGNOSIS — Z853 Personal history of malignant neoplasm of breast: Secondary | ICD-10-CM | POA: Insufficient documentation

## 2013-10-25 DIAGNOSIS — C50911 Malignant neoplasm of unspecified site of right female breast: Secondary | ICD-10-CM

## 2013-10-25 DIAGNOSIS — Z170 Estrogen receptor positive status [ER+]: Secondary | ICD-10-CM

## 2013-10-25 DIAGNOSIS — Z85820 Personal history of malignant melanoma of skin: Secondary | ICD-10-CM

## 2013-10-25 LAB — CBC WITH DIFFERENTIAL/PLATELET
BASO%: 0.8 % (ref 0.0–2.0)
Basophils Absolute: 0 10*3/uL (ref 0.0–0.1)
EOS%: 1.4 % (ref 0.0–7.0)
Eosinophils Absolute: 0.1 10*3/uL (ref 0.0–0.5)
HCT: 39.3 % (ref 34.8–46.6)
HEMOGLOBIN: 12.7 g/dL (ref 11.6–15.9)
LYMPH#: 1.6 10*3/uL (ref 0.9–3.3)
LYMPH%: 27.8 % (ref 14.0–49.7)
MCH: 31 pg (ref 25.1–34.0)
MCHC: 32.2 g/dL (ref 31.5–36.0)
MCV: 96.2 fL (ref 79.5–101.0)
MONO#: 0.4 10*3/uL (ref 0.1–0.9)
MONO%: 7.7 % (ref 0.0–14.0)
NEUT#: 3.5 10*3/uL (ref 1.5–6.5)
NEUT%: 62.3 % (ref 38.4–76.8)
Platelets: 225 10*3/uL (ref 145–400)
RBC: 4.09 10*6/uL (ref 3.70–5.45)
RDW: 13.7 % (ref 11.2–14.5)
WBC: 5.6 10*3/uL (ref 3.9–10.3)

## 2013-10-25 LAB — COMPREHENSIVE METABOLIC PANEL (CC13)
ALT: 19 U/L (ref 0–55)
AST: 17 U/L (ref 5–34)
Albumin: 3.5 g/dL (ref 3.5–5.0)
Alkaline Phosphatase: 53 U/L (ref 40–150)
Anion Gap: 7 mEq/L (ref 3–11)
BUN: 16 mg/dL (ref 7.0–26.0)
CALCIUM: 9.5 mg/dL (ref 8.4–10.4)
CHLORIDE: 108 meq/L (ref 98–109)
CO2: 29 mEq/L (ref 22–29)
Creatinine: 0.8 mg/dL (ref 0.6–1.1)
Glucose: 107 mg/dl (ref 70–140)
POTASSIUM: 3.9 meq/L (ref 3.5–5.1)
Sodium: 144 mEq/L (ref 136–145)
TOTAL PROTEIN: 6.4 g/dL (ref 6.4–8.3)
Total Bilirubin: 0.63 mg/dL (ref 0.20–1.20)

## 2013-10-25 NOTE — Progress Notes (Signed)
ID: Victoria Long   DOB: [birth year]  MR#: [id number]  EXN#:[id number]  PCP: Alesia Richards, MD OTHER M.D. Grayce Sessions Arty Baumgartner  HISTORY OF PRESENT ILLNESS: Victoria Long had bilateral screening mammography April 30th, 2012 at the [facility] showing a possible mass in the right breast.  She was brought back for additional views on May 7th, and Dr. Sadie Haber was able to confirm an oval, slightly irregular mass in the outer portion of the right breast which was not palpable by her exam.  Ultrasound showed an ill-defined, hypoechoic nodule measuring 8 mm with no abnormal lymph nodes on the right.  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   With this information, the patient was referred for bilateral MRIs of the breast performed Jun 02, 2010.  This showed a solitary area of non-mass enhancement measuring 2.1 cm, some of this being postbiopsy change.  There were no suspicious lymph nodes or evidence of contralateral disease. Her case was presented at the multidisciplinary Breast Cancer conference and she proceeded to breast conserving surgery, as detailed below.  INTERVAL HISTORY: Victoria Long returns today for followup of her right breast cancer. Interval history is significant for her having had a shave biopsy of a malignant melanoma from her right posterior arm 01/26/2013. The pathology ([id number]) showed a superficial spreading malignant melanoma with a thickness of 0.56 mm, anatomic level IV, with close but negative margins. There was no ulceration, no lymphovascular invasion, no evidence of regression, and the mitotic index was 1 per millimeter squared. There was moderately brisk tumor infiltrating lymphocytes. On 02/04/2013 she underwent additional excision for margins, showing some residual in situ component  but clear margins.  REVIEW OF SYSTEMS: Victoria Long is doing fine in general and certainly from a breast cancer point of view there are no symptoms suggestive of disease recurrence or progression. She walks about a mile at a time, up to 4 times a week, except her knees her given her trouble. She has had no unusual headaches, visual changes, nausea, vomiting, dizziness, gait imbalance, cough, phlegm production, pleurisy, chest pain or pressure, or change in bowel habits. She has mild stress urinary incontinence. A detailed review of systems today was otherwise noncontributory.   PAST MEDICAL HISTORY: Significant for COPD/asthma, history of depression and anxiety, history of GERD, history of the right total knee replacement in May 2009, history of obesity now much improved, excision of a meatal urethral lesion May 2010.  FAMILY HISTORY There is no history of breast or ovarian cancer in the family.  The patient's mother's sister had a diagnosis of lung cancer made at the age of 75.  GYNECOLOGIC HISTORY: She had menarche at age 54, underwent menopause in 72.  She had her first live birth at age 89 and had a total of 4 live births and started having mammography at age 61.  SOCIAL HISTORY: She is a retired Pharmacist, hospital.  Her son, Enid Baas present at her initial visit with his wife, Maudie Mercury.  A second child is Warden/ranger.  Both of them live in [place].  Son Milus Banister lives in [place] and son Bethena Midget lives in [place].   ADVANCED DIRECTIVES: Not in place  HEALTH MAINTENANCE: History  Substance Use Topics  . Smoking status: Never Smoker   . Smokeless tobacco: Never Used  . Alcohol  Use: 2.0 oz/week    4 drink(s) per week     Comment: wine     Colonoscopy:  PAP:  Bone density:  Lipid panel:  No Known Allergies  Current Outpatient Prescriptions  Medication Sig Dispense Refill  . aspirin 81 MG tablet Take 81 mg by mouth daily.      Marland Kitchen atorvastatin (LIPITOR) 80 MG tablet  1/2 to 1 tablet daily or as directed for cholesterol  30 tablet  99  . calcium carbonate (OS-CAL) 600 MG TABS Take 600 mg by mouth 2 (two) times daily with a meal.       . Cholecalciferol (VITAMIN D) 1000 UNITS capsule Take 1,000 Units by mouth 2 (two) times daily.       Marland Kitchen LYSINE HCL PO Take 500 mg by mouth daily.       . Multiple Vitamins-Minerals (MULTIVITAMIN PO) Take by mouth daily.      . Omega-3 Fatty Acids (FISH OIL PO) Take by mouth daily. Mega Red       No current facility-administered medications for this visit.    OBJECTIVE: Victoria Long  in no acute distress  Filed Vitals:   10/25/13 1428  BP: 117/99  Pulse: 69  Temp: 97.4 F (36.3 C)  Resp: 18     Body mass index is 23.21 kg/(m^2).    ECOG FS:1 Filed Weights   10/25/13 1428  Weight: 135 lb 4.8 oz (61.372 kg)   Sclerae unicteric, EOMs intact  Oropharynx clear, teeth in good repair  No cervical or supraclavicular adenopathy  Lungs no rales or rhonchi Heart regular rate and rhythm Abdomen soft, nontender, with positive bowel sounds MSK no focal spinal tenderness, no upper extremity lymphedema  Neuro: nonfocal,  well-oriented, appropriate affect.  Breasts: The right breast is status post lumpectomy. There is no evidence of local recurrence. The right axilla is benign Left breast is unremarkable.    Skin: There is no mass or residual finding by inspection or palpation in the posterior right upper arm.   LAB RESULTS: Lab Results  Component Value Date   WBC 5.6 10/25/2013   NEUTROABS 3.5 10/25/2013   HGB 12.7 10/25/2013   HCT 39.3 10/25/2013   MCV 96.2 10/25/2013   PLT 225 10/25/2013      Chemistry      Component Value Date/Time   NA 144 10/25/2013 1411   NA 141 07/27/2013 1526   K 3.9 10/25/2013 1411   K 4.1 07/27/2013 1526   CL 105 07/27/2013 1526   CL 107 06/08/2012 0851   CO2 29 10/25/2013 1411   CO2 26 07/27/2013 1526   BUN 16.0 10/25/2013 1411   BUN 14 07/27/2013 1526   CREATININE 0.8 10/25/2013 1411    CREATININE 0.53 07/27/2013 1526   CREATININE 0.65 06/11/2011 1433      Component Value Date/Time   CALCIUM 9.5 10/25/2013 1411   CALCIUM 9.5 07/27/2013 1526   ALKPHOS 53 10/25/2013 1411   ALKPHOS 48 07/27/2013 1526   AST 17 10/25/2013 1411   AST 19 07/27/2013 1526   ALT 19 10/25/2013 1411   ALT 17 07/27/2013 1526   BILITOT 0.63 10/25/2013 1411   BILITOT 0.6 07/27/2013 1526       Lab Results  Component Value Date   LABCA2 37 12/10/2011    STUDIES: CLINICAL DATA: Patient presents for a bilateral diagnostic  mammogram due to a history of a prior right malignant lumpectomy  2012.  EXAM:  DIGITAL DIAGNOSTIC bilateral MAMMOGRAM WITH CAD  COMPARISON: Previous exams.  ACR Breast Density Category b: There are scattered areas of  fibroglandular density.  FINDINGS:  Examination demonstrates stable post lumpectomy changes over the  outer central right breast. Remainder of the exam is unchanged.  Mammographic images were processed with CAD.  IMPRESSION:  Stable post lumpectomy changes of the outer central right breast.  RECOMMENDATION:  Recommend continued annual bilateral diagnostic mammographic  evaluation.  I have discussed the findings and recommendations with the patient.  Results were also provided in writing at the conclusion of the  visit. If applicable, a reminder letter will be sent to the patient  regarding the next appointment.  BI-RADS CATEGORY 2: Benign.  Electronically Signed  By: Marin Olp M.D.  On: 06/04/2013 13:28   ASSESSMENT: 80 y.o.  [place]   (1)  status post right lumpectomy June of 2012 for a T1b N0, Stage IA invasive ductal carcinoma,grade 1 with abundant extracellular mucin,  estrogen and progesterone receptor positive, Her-2 negative, with an Mib-1 of 4%. Margins were negative but close   (2) adjuvant  radiation completed August of 2012.   (3)  No adjuvant antiestrogens given.  (4) malignant melanoma removed from the right posterior arm 01/26/2013,  Breslow thickness 0.56 mm, level IV, no ulceration, overall pT1b NX with no high-risk lesions; additional surgery for margins 02/04/2013 showed residual in situ but no residual invasive disease, with clear margins.  PLAN:  Ayeisha is doing very well with regards to her breast cancer. I. will see her one more time a year from now and she will hopefully graduate from followup at that point  I gave her a copy of her melanoma pathology and reassured her that removal with clear margins was all that was necessary. Of course she continues to be closely followed by Dr. Bufford Lope.  Swati will call with any problems that may develop before her next visit here.  Avnoor Koury C    10/25/2013

## 2013-10-25 NOTE — Telephone Encounter (Signed)
gv pt appt schedule for oct 2016. per 10/25/13 pof lb wk before f/u in 9yr. pt needs mondays but can't come 10/31/14 so lb scheduled for 10/27/14 and f/u for 11/07/14.

## 2013-11-02 ENCOUNTER — Other Ambulatory Visit: Payer: Self-pay

## 2013-11-08 ENCOUNTER — Encounter: Payer: Self-pay | Admitting: Physician Assistant

## 2013-11-08 ENCOUNTER — Ambulatory Visit (INDEPENDENT_AMBULATORY_CARE_PROVIDER_SITE_OTHER): Payer: Medicare Other | Admitting: Physician Assistant

## 2013-11-08 VITALS — BP 122/78 | HR 68 | Temp 98.2°F | Resp 16 | Ht 63.0 in | Wt 135.0 lb

## 2013-11-08 DIAGNOSIS — IMO0001 Reserved for inherently not codable concepts without codable children: Secondary | ICD-10-CM

## 2013-11-08 DIAGNOSIS — Z79899 Other long term (current) drug therapy: Secondary | ICD-10-CM

## 2013-11-08 DIAGNOSIS — Z23 Encounter for immunization: Secondary | ICD-10-CM

## 2013-11-08 DIAGNOSIS — M609 Myositis, unspecified: Secondary | ICD-10-CM

## 2013-11-08 DIAGNOSIS — R7303 Prediabetes: Secondary | ICD-10-CM

## 2013-11-08 DIAGNOSIS — E559 Vitamin D deficiency, unspecified: Secondary | ICD-10-CM

## 2013-11-08 DIAGNOSIS — M25561 Pain in right knee: Secondary | ICD-10-CM

## 2013-11-08 DIAGNOSIS — M791 Myalgia: Secondary | ICD-10-CM

## 2013-11-08 DIAGNOSIS — E785 Hyperlipidemia, unspecified: Secondary | ICD-10-CM

## 2013-11-08 LAB — CBC WITH DIFFERENTIAL/PLATELET
BASOS PCT: 1 % (ref 0–1)
Basophils Absolute: 0.1 10*3/uL (ref 0.0–0.1)
EOS PCT: 2 % (ref 0–5)
Eosinophils Absolute: 0.1 10*3/uL (ref 0.0–0.7)
HCT: 38.3 % (ref 36.0–46.0)
Hemoglobin: 12.7 g/dL (ref 12.0–15.0)
LYMPHS ABS: 1.9 10*3/uL (ref 0.7–4.0)
Lymphocytes Relative: 33 % (ref 12–46)
MCH: 31.3 pg (ref 26.0–34.0)
MCHC: 33.2 g/dL (ref 30.0–36.0)
MCV: 94.3 fL (ref 78.0–100.0)
Monocytes Absolute: 0.6 10*3/uL (ref 0.1–1.0)
Monocytes Relative: 11 % (ref 3–12)
Neutro Abs: 3.1 10*3/uL (ref 1.7–7.7)
Neutrophils Relative %: 53 % (ref 43–77)
PLATELETS: 262 10*3/uL (ref 150–400)
RBC: 4.06 MIL/uL (ref 3.87–5.11)
RDW: 13.5 % (ref 11.5–15.5)
WBC: 5.8 10*3/uL (ref 4.0–10.5)

## 2013-11-08 LAB — HEMOGLOBIN A1C
HEMOGLOBIN A1C: 6 % — AB (ref <5.7)
MEAN PLASMA GLUCOSE: 126 mg/dL — AB (ref <117)

## 2013-11-08 NOTE — Patient Instructions (Addendum)
Lipoma A lipoma is a noncancerous (benign) tumor composed of fat cells. They are usually found under the skin (subcutaneous). A lipoma may occur in any tissue of the body that contains fat. Common areas for lipomas to appear include the back, shoulders, buttocks, and thighs. Lipomas are a very common soft tissue growth. They are soft and grow slowly. Most problems caused by a lipoma depend on where it is growing. DIAGNOSIS  A lipoma can be diagnosed with a physical exam. These tumors rarely become cancerous, but radiographic studies can help determine this for certain. Studies used may include:  Computerized X-ray scans (CT or CAT scan).  Computerized magnetic scans (MRI). TREATMENT  Small lipomas that are not causing problems may be watched. If a lipoma continues to enlarge or causes problems, removal is often the best treatment. Lipomas can also be removed to improve appearance. Surgery is done to remove the fatty cells and the surrounding capsule. Most often, this is done with medicine that numbs the area (local anesthetic). The removed tissue is examined under a microscope to make sure it is not cancerous. Keep all follow-up appointments with your caregiver. SEEK MEDICAL CARE IF:   The lipoma becomes larger or hard.  The lipoma becomes painful, red, or increasingly swollen. These could be signs of infection or a more serious condition. Document Released: 12/28/2001 Document Revised: 04/01/2011 Document Reviewed: 06/09/2009 [facility] Patient Information 2015 [place], [place]. This information is not intended to replace advice given to you by your health care provider. Make sure you discuss any questions you have with your health care provider.  Use a dropper to put olive oil or canola oil in the effected ear- 2-3 times a week. Let it soak for 20-30 min then you can take a shower or use a baby bulb with warm water to wash out the ear wax.  Do not use Qtips  Knee Exercises EXERCISES RANGE OF  MOTION (ROM) AND STRETCHING EXERCISES These exercises may help you when beginning to rehabilitate your injury. Your symptoms may resolve with or without further involvement from your physician, physical therapist, or athletic trainer. While completing these exercises, remember:   Restoring tissue flexibility helps normal motion to return to the joints. This allows healthier, less painful movement and activity.  An effective stretch should be held for at least 30 seconds.  A stretch should never be painful. You should only feel a gentle lengthening or release in the stretched tissue. STRETCH - Knee Extension, Prone  Lie on your stomach on a firm surface, such as a bed or countertop. Place your right / left knee and leg just beyond the edge of the surface. You may wish to place a towel under the far end of your right / left thigh for comfort.  Relax your leg muscles and allow gravity to straighten your knee. Your clinician may advise you to add an ankle weight if more resistance is helpful for you.  You should feel a stretch in the back of your right / left knee. Hold this position for __________ seconds. Repeat __________ times. Complete this stretch __________ times per day. * Your physician, physical therapist, or athletic trainer may ask you to add ankle weight to enhance your stretch.  RANGE OF MOTION - Knee Flexion, Active  Lie on your back with both knees straight. (If this causes back discomfort, bend your opposite knee, placing your foot flat on the floor.)  Slowly slide your heel back toward your buttocks until you feel a gentle  stretch in the front of your knee or thigh.  Hold for __________ seconds. Slowly slide your heel back to the starting position. Repeat __________ times. Complete this exercise __________ times per day.  STRETCH - Quadriceps, Prone   Lie on your stomach on a firm surface, such as a bed or padded floor.  Bend your right / left knee and grasp your ankle. If  you are unable to reach your ankle or pant leg, use a belt around your foot to lengthen your reach.  Gently pull your heel toward your buttocks. Your knee should not slide out to the side. You should feel a stretch in the front of your thigh and/or knee.  Hold this position for __________ seconds. Repeat __________ times. Complete this stretch __________ times per day.  STRETCH - Hamstrings, Supine   Lie on your back. Loop a belt or towel over the ball of your right / left foot.  Straighten your right / left knee and slowly pull on the belt to raise your leg. Do not allow the right / left knee to bend. Keep your opposite leg flat on the floor.  Raise the leg until you feel a gentle stretch behind your right / left knee or thigh. Hold this position for __________ seconds. Repeat __________ times. Complete this stretch __________ times per day.  STRENGTHENING EXERCISES These exercises may help you when beginning to rehabilitate your injury. They may resolve your symptoms with or without further involvement from your physician, physical therapist, or athletic trainer. While completing these exercises, remember:   Muscles can gain both the endurance and the strength needed for everyday activities through controlled exercises.  Complete these exercises as instructed by your physician, physical therapist, or athletic trainer. Progress the resistance and repetitions only as guided.  You may experience muscle soreness or fatigue, but the pain or discomfort you are trying to eliminate should never worsen during these exercises. If this pain does worsen, stop and make certain you are following the directions exactly. If the pain is still present after adjustments, discontinue the exercise until you can discuss the trouble with your clinician. STRENGTH - Quadriceps, Isometrics  Lie on your back with your right / left leg extended and your opposite knee bent.  Gradually tense the muscles in the front  of your right / left thigh. You should see either your knee cap slide up toward your hip or increased dimpling just above the knee. This motion will push the back of the knee down toward the floor/mat/bed on which you are lying.  Hold the muscle as tight as you can without increasing your pain for __________ seconds.  Relax the muscles slowly and completely in between each repetition. Repeat __________ times. Complete this exercise __________ times per day.  STRENGTH - Quadriceps, Short Arcs   Lie on your back. Place a __________ inch towel roll under your knee so that the knee slightly bends.  Raise only your lower leg by tightening the muscles in the front of your thigh. Do not allow your thigh to rise.  Hold this position for __________ seconds. Repeat __________ times. Complete this exercise __________ times per day.  OPTIONAL ANKLE WEIGHTS: Begin with ____________________, but DO NOT exceed ____________________. Increase in 1 pound/0.5 kilogram increments.  STRENGTH - Quadriceps, Straight Leg Raises  Quality counts! Watch for signs that the quadriceps muscle is working to insure you are strengthening the correct muscles and not "cheating" by substituting with healthier muscles.  Lay on your back  with your right / left leg extended and your opposite knee bent.  Tense the muscles in the front of your right / left thigh. You should see either your knee cap slide up or increased dimpling just above the knee. Your thigh may even quiver.  Tighten these muscles even more and raise your leg 4 to 6 inches off the floor. Hold for __________ seconds.  Keeping these muscles tense, lower your leg.  Relax the muscles slowly and completely in between each repetition. Repeat __________ times. Complete this exercise __________ times per day.  STRENGTH - Hamstring, Curls  Lay on your stomach with your legs extended. (If you lay on a bed, your feet may hang over the edge.)  Tighten the muscles in  the back of your thigh to bend your right / left knee up to 90 degrees. Keep your hips flat on the bed/floor.  Hold this position for __________ seconds.  Slowly lower your leg back to the starting position. Repeat __________ times. Complete this exercise __________ times per day.  OPTIONAL ANKLE WEIGHTS: Begin with ____________________, but DO NOT exceed ____________________. Increase in 1 pound/0.5 kilogram increments.  STRENGTH - Quadriceps, Squats  Stand in a door frame so that your feet and knees are in line with the frame.  Use your hands for balance, not support, on the frame.  Slowly lower your weight, bending at the hips and knees. Keep your lower legs upright so that they are parallel with the door frame. Squat only within the range that does not increase your knee pain. Never let your hips drop below your knees.  Slowly return upright, pushing with your legs, not pulling with your hands. Repeat __________ times. Complete this exercise __________ times per day.  STRENGTH - Quadriceps, Wall Slides  Follow guidelines for form closely. Increased knee pain often results from poorly placed feet or knees.  Lean against a smooth wall or door and walk your feet out 18-24 inches. Place your feet hip-width apart.  Slowly slide down the wall or door until your knees bend __________ degrees.* Keep your knees over your heels, not your toes, and in line with your hips, not falling to either side.  Hold for __________ seconds. Stand up to rest for __________ seconds in between each repetition. Repeat __________ times. Complete this exercise __________ times per day. * Your physician, physical therapist, or athletic trainer will alter this angle based on your symptoms and progress. Document Released: 11/21/2004 Document Revised: 05/24/2013 Document Reviewed: 04/21/2008 [facility] Patient Information 2015 [place], [place]. This information is not intended to replace advice given to you by your  health care provider. Make sure you discuss any questions you have with your health care provider.

## 2013-11-08 NOTE — Progress Notes (Signed)
Assessment and Plan:  Reminder to go to the ER if any CP, SOB, nausea, dizziness, severe HA, changes vision/speech, left arm numbness and tingling, and jaw pain. Cholesterol: Continue diet and exercise. Check cholesterol.  Pre-diabetes-Continue diet and exercise. Check A1C Vitamin D Def- check level and continue medications.  Right leg weakness/pain- normal exam- + right knee pain/bursitis- will get ESR/CPK rule out PMR/statin use- declines Xray, ortho referal, will do OTC NSAID and follow up if not better/gets worse  Continue diet and meds as discussed. Further disposition pending results of labs.  HPI 80 y.o. female  presents for 3 month follow up with hypertension, hyperlipidemia, prediabetes and vitamin D.  Her blood pressure has been controlled at home, today their BP is BP: 122/78 mmHg She does workout when her right knee is not bothering her. She denies chest pain, shortness of breath, dizziness.   She is on cholesterol medication, she was started less than a year ago, she has has some leg myalgias and states that she has had some weakness in that right leg, occ having to "pick up her leg to put her shoes on. She has a lipoma on her right anterior leg,  aHer cholesterol is at goal. The cholesterol last visit was:   Lab Results  Component Value Date   CHOL 164 07/27/2013   HDL 70 07/27/2013   LDLCALC 78 07/27/2013   TRIG 79 07/27/2013   CHOLHDL 2.3 07/27/2013    She has been working on diet and exercise for prediabetes, and denies paresthesia of the feet, polydipsia and polyuria. Last A1C in the office was:  Lab Results  Component Value Date   HGBA1C 5.9* 07/27/2013    Patient is on Vitamin D supplement.   Lab Results  Component Value Date   VD25OH 89 07/27/2013       Current Medications:  Current Outpatient Prescriptions on File Prior to Visit  Medication Sig Dispense Refill  . aspirin 81 MG tablet Take 81 mg by mouth daily.      Marland Kitchen atorvastatin (LIPITOR) 80 MG tablet 1/2 to 1  tablet daily or as directed for cholesterol  30 tablet  99  . calcium carbonate (OS-CAL) 600 MG TABS Take 600 mg by mouth 2 (two) times daily with a meal.       . Cholecalciferol (VITAMIN D) 1000 UNITS capsule Take 1,000 Units by mouth 2 (two) times daily.       Marland Kitchen LYSINE HCL PO Take 500 mg by mouth daily.       . Multiple Vitamins-Minerals (MULTIVITAMIN PO) Take by mouth daily.      . Omega-3 Fatty Acids (FISH OIL PO) Take by mouth daily. Mega Red       No current facility-administered medications on file prior to visit.   Medical History:  Past Medical History  Diagnosis Date  . Hyperlipidemia   . Prediabetes   . Vitamin D deficiency   . Arthritis     false + RF, negative anticcp  . Cancer     right breast cancer  . Melanoma 2014    right humerus   Allergies: No Known Allergies   Review of Systems: _0  = complains of  _1  = denies  General: Fatigue _2  Fever _3  Chills _4  Weakness _5   Insomnia _6  Eyes: Redness _7  Blurred vision _8  Diplopia _9   ENT: Congestion _10  Sinus Pain _11  Post Nasal Drip _12  Sore Throat _13  Earache _14   Cardiac: Chest pain/pressure _0  SOB _1  Orthopnea _2   Palpitations _3   Paroxysmal nocturnal dyspnea_4  Claudication _5  Edema _6   Pulmonary: Cough _7  Wheezing_8   SOB _9   Snoring _10   GI: Nausea _11  Vomiting_12  Dysphagia_13  Heartburn_14  Abdominal pain _15  Constipation _16 ; Diarrhea _17 ; BRBPR _18  Melena_19  GU: Hematuria_20  Dysuria _21  Nocturia_22  Urgency _23   Hesitancy _24  Discharge _25  Neuro: Headaches_26  Vertigo_27  Paresthesias_28  Spasm _29  Speech changes _30  Incoordination _31   Ortho: Arthritis _32  Joint pain, right knee [x ] Muscle pain, legs [x ] Joint swelling _33  Back Pain NEGATIVE _34  Skin:  Rash _35   Pruritis _36  Change in skin lesion _37   Psych: Depression_38  Anxiety_39  Confusion _40  Memory loss _41   Heme/Lypmh: Bleeding _42  Bruising _43  Enlarged lymph nodes _44   Endocrine: Visual blurring _45  Paresthesia _46  Polyuria _47  Polydypsea [  ]   Heat/cold intolerance _48  Hypoglycemia _49   Family history- Review and unchanged Social history- Review and unchanged Physical Exam: BP 122/78  Pulse 68  Temp(Src) 98.2 F (36.8 C)  Resp 16  Ht _50  (1.6 m)  Wt 135 lb (61.236 kg)  BMI 23.92 kg/m2 Wt Readings from Last 3 Encounters:  11/08/13 135 lb (61.236 kg)  10/25/13 135 lb 4.8 oz (61.372 kg)  07/27/13 136 lb (61.689 kg)   General Appearance: Well nourished, in no apparent distress. Eyes: PERRLA, EOMs, conjunctiva no swelling or erythema Sinuses: No Frontal/maxillary tenderness ENT/Mouth: Ext aud canals clear, TMs without erythema, bulging. No erythema, swelling, or exudate on post pharynx.  Tonsils not swollen or erythematous. Hearing normal.  Neck: Supple, thyroid normal.  Respiratory: Respiratory effort normal, BS equal bilaterally without rales, rhonchi, wheezing or stridor.  Cardio: RRR with no MRGs, prominent S2. Brisk peripheral pulses without edema.  Abdomen: Soft, + BS.  Non tender, no guarding, rebound, hernias, masses. Lymphatics: Non tender without lymphadenopathy.  Musculoskeletal: Full ROM, 5/5 strength, normal gait. No right SI pain, no right hip pain, + right knee pain medially with slight prominence, distally no edema, pulses brisk, sensation intact.  Skin: Warm, dry without rashes, lesions, ecchymosis. Area of erythema on left upper chest from removal with Derm, no warmth, discharge.   Neuro: Cranial nerves intact. Normal muscle tone, no cerebellar symptoms. Sensation intact.  Psych: Awake and oriented X 3, normal affect, Insight and Judgment appropriate.    Vicie Mutters, PA-C 1:35 PM [facility]

## 2013-11-09 LAB — MAGNESIUM: MAGNESIUM: 1.8 mg/dL (ref 1.5–2.5)

## 2013-11-09 LAB — BASIC METABOLIC PANEL WITH GFR
BUN: 16 mg/dL (ref 6–23)
CO2: 30 meq/L (ref 19–32)
CREATININE: 0.52 mg/dL (ref 0.50–1.10)
Calcium: 9.5 mg/dL (ref 8.4–10.5)
Chloride: 103 mEq/L (ref 96–112)
GFR, Est Non African American: 89 mL/min
GLUCOSE: 83 mg/dL (ref 70–99)
Potassium: 4.2 mEq/L (ref 3.5–5.3)
Sodium: 141 mEq/L (ref 135–145)

## 2013-11-09 LAB — HEPATIC FUNCTION PANEL
ALT: 14 U/L (ref 0–35)
AST: 16 U/L (ref 0–37)
Albumin: 4.1 g/dL (ref 3.5–5.2)
Alkaline Phosphatase: 50 U/L (ref 39–117)
Bilirubin, Direct: 0.1 mg/dL (ref 0.0–0.3)
Indirect Bilirubin: 0.5 mg/dL (ref 0.2–1.2)
Total Bilirubin: 0.6 mg/dL (ref 0.2–1.2)
Total Protein: 6.3 g/dL (ref 6.0–8.3)

## 2013-11-09 LAB — LIPID PANEL
CHOLESTEROL: 174 mg/dL (ref 0–200)
HDL: 72 mg/dL (ref >39)
LDL Cholesterol: 84 mg/dL (ref 0–99)
Total CHOL/HDL Ratio: 2.4 Ratio
Triglycerides: 90 mg/dL (ref <150)
VLDL: 18 mg/dL (ref 0–40)

## 2013-11-09 LAB — TSH: TSH: 1.694 u[IU]/mL (ref 0.350–4.500)

## 2013-11-09 LAB — CK: Total CK: 31 U/L (ref 7–177)

## 2013-11-09 LAB — SEDIMENTATION RATE: Sed Rate: 4 mm/hr (ref 0–22)

## 2013-11-09 LAB — INSULIN, FASTING: Insulin fasting, serum: 6.1 u[IU]/mL (ref 2.0–19.6)

## 2013-11-10 ENCOUNTER — Telehealth: Payer: Self-pay

## 2013-11-10 NOTE — Telephone Encounter (Signed)
Returned call to patient, she states she cannot get on mychart, she states she thought she had to have it, deactivated her mychart and gave her lab results from 11-08-13 visit and mailed her copy of labs per her request.

## 2014-04-26 ENCOUNTER — Other Ambulatory Visit: Payer: Self-pay | Admitting: Internal Medicine

## 2014-04-27 ENCOUNTER — Ambulatory Visit (INDEPENDENT_AMBULATORY_CARE_PROVIDER_SITE_OTHER): Payer: Medicare Other | Admitting: Internal Medicine

## 2014-04-27 ENCOUNTER — Encounter: Payer: Self-pay | Admitting: Internal Medicine

## 2014-04-27 VITALS — BP 136/64 | HR 84 | Temp 98.6°F | Resp 16 | Ht 63.0 in | Wt 137.0 lb

## 2014-04-27 DIAGNOSIS — R6889 Other general symptoms and signs: Secondary | ICD-10-CM

## 2014-04-27 DIAGNOSIS — C50911 Malignant neoplasm of unspecified site of right female breast: Secondary | ICD-10-CM

## 2014-04-27 DIAGNOSIS — R7309 Other abnormal glucose: Secondary | ICD-10-CM

## 2014-04-27 DIAGNOSIS — E782 Mixed hyperlipidemia: Secondary | ICD-10-CM

## 2014-04-27 DIAGNOSIS — Z9181 History of falling: Secondary | ICD-10-CM

## 2014-04-27 DIAGNOSIS — E559 Vitamin D deficiency, unspecified: Secondary | ICD-10-CM

## 2014-04-27 DIAGNOSIS — Z0001 Encounter for general adult medical examination with abnormal findings: Secondary | ICD-10-CM

## 2014-04-27 DIAGNOSIS — Z1331 Encounter for screening for depression: Secondary | ICD-10-CM

## 2014-04-27 DIAGNOSIS — Z79899 Other long term (current) drug therapy: Secondary | ICD-10-CM

## 2014-04-27 DIAGNOSIS — R7303 Prediabetes: Secondary | ICD-10-CM

## 2014-04-27 DIAGNOSIS — Z0000 Encounter for general adult medical examination without abnormal findings: Secondary | ICD-10-CM

## 2014-04-27 DIAGNOSIS — E785 Hyperlipidemia, unspecified: Secondary | ICD-10-CM

## 2014-04-27 MED ORDER — ATORVASTATIN CALCIUM 80 MG PO TABS: ORAL_TABLET | ORAL | Status: DC

## 2014-04-27 NOTE — Patient Instructions (Signed)
Carbamide Peroxide ear solution What is this medicine? CARBAMIDE PEROXIDE (CAR bah mide per OX ide) is used to soften and help remove ear wax. This medicine may be used for other purposes; ask your health care provider or pharmacist if you have questions. COMMON BRAND NAME(S): Auro Ear, Auro Earache Relief, Debrox, Ear Drops, Ear Wax Removal, Ear Wax Remover, Earwax Treatment, Murine, Thera-Ear What should I tell my health care provider before I take this medicine? They need to know if you have any of these conditions: -dizziness -ear discharge -ear pain, irritation or rash -infection -perforated eardrum (hole in eardrum) -an unusual or allergic reaction to carbamide peroxide, glycerin, hydrogen peroxide, other medicines, foods, dyes, or preservatives -pregnant or trying to get pregnant -breast-feeding How should I use this medicine? This medicine is only for use in the outer ear canal. Follow the directions carefully. Wash hands before and after use. The solution may be warmed by holding the bottle in the hand for 1 to 2 minutes. Lie with the affected ear facing upward. Place the proper number of drops into the ear canal. After the drops are instilled, remain lying with the affected ear upward for 5 minutes to help the drops stay in the ear canal. A cotton ball may be gently inserted at the ear opening for no longer than 5 to 10 minutes to ensure retention. Repeat, if necessary, for the opposite ear. Do not touch the tip of the dropper to the ear, fingertips, or other surface. Do not rinse the dropper after use. Keep container tightly closed. Talk to your pediatrician regarding the use of this medicine in children. While this drug may be used in children as young as 12 years for selected conditions, precautions do apply. Overdosage: If you think you have taken too much of this medicine contact a poison control center or emergency room at once. NOTE: This medicine is only for you. Do not share  this medicine with others. What if I miss a dose? If you miss a dose, use it as soon as you can. If it is almost time for your next dose, use only that dose. Do not use double or extra doses. What may interact with this medicine? Interactions are not expected. Do not use any other ear products without asking your doctor or health care professional. This list may not describe all possible interactions. Give your health care provider a list of all the medicines, herbs, non-prescription drugs, or dietary supplements you use. Also tell them if you smoke, drink alcohol, or use illegal drugs. Some items may interact with your medicine. What should I watch for while using this medicine? This medicine is not for long-term use. Do not use for more than 4 days without checking with your health care professional. Contact your doctor or health care professional if your condition does not start to get better within a few days or if you notice burning, redness, itching or swelling. What side effects may I notice from receiving this medicine? Side effects that you should report to your doctor or health care professional as soon as possible: -allergic reactions like skin rash, itching or hives, swelling of the face, lips, or tongue -burning, itching, and redness -worsening ear pain -rash Side effects that usually do not require medical attention (report to your doctor or health care professional if they continue or are bothersome): -abnormal sensation while putting the drops in the ear -temporary reduction in hearing (but not complete loss of hearing) This list may not  describe all possible side effects. Call your doctor for medical advice about side effects. You may report side effects to FDA at 1-800-FDA-1088. Where should I keep my medicine? Keep out of the reach of children. Store at room temperature between 15 and 30 degrees C (59 and 86 degrees F) in a tight, light-resistant container. Keep bottle away  from excessive heat and direct sunlight. Throw away any unused medicine after the expiration date. NOTE: This sheet is a summary. It may not cover all possible information. If you have questions about this medicine, talk to your doctor, pharmacist, or health care provider.  2015, [employer]. (2007-04-21 14:00:02)

## 2014-04-27 NOTE — Progress Notes (Addendum)
Wellness exam and 3 month follow-up visit  Assessment:    1. Mixed hyperlipidemia -cont meds -check lipids - atorvastatin (LIPITOR) 80 MG tablet; 1/2 to 1 tablet daily or as directed for cholesterol  Dispense: 30 tablet; Refill: 3   2. Prediabetes -diet and exercise - Hemoglobin A1c - Insulin, fasting  3. Hyperlipidemia -cont meds - Lipid panel  4. Vitamin D deficiency -cont supplement - Vit D  25 hydroxy (rtn osteoporosis monitoring)  5. Encounter for long-term (current) use of medications  - CBC with Differential/Platelet - BASIC METABOLIC PANEL WITH GFR - Hepatic function panel - Magnesium  6. Breast cancer, right breast -followed by Dr. Griffith Citron     Plan:   During the course of the visit the patient was educated and counseled about appropriate screening and preventive services including:    Pneumococcal vaccine   Influenza vaccine  Td vaccine  Screening electrocardiogram  Bone densitometry screening  Colorectal cancer screening  Diabetes screening  Glaucoma screening  Nutrition counseling   Advanced directives: requested  Screening recommendations, referrals: Vaccinations:  Immunization History  Administered Date(s) Administered  . Influenza, High Dose Seasonal PF 11/08/2013  . Influenza-Unspecified 11/10/2012  . Pneumococcal Conjugate-13 07/27/2013  . Pneumococcal-Unspecified 11/30/2010  . Td 02/12/2009  . Zoster 02/12/2006   Nutrition assessed and recommended  Colonoscopy not indicated Recommended yearly ophthalmology/optometry visit for glaucoma screening and checkup Recommended yearly dental visit for hygiene and checkup Advanced directives - patient has the paperwork and has not filled it out  Conditions/risks identified: BMI: Discussed weight loss, diet, and increase physical activity.  Increase physical activity: AHA recommends 150 minutes of physical activity a week.  Medications reviewed Prediabetes is at goal Urinary  Incontinence is not an issue: discussed non pharmacology and pharmacology options.  Fall risk: low- discussed PT, home fall assessment, medications.   Subjective:    Victoria Long is a 81 y.o.  female who presents for Medicare Annual Wellness Visit and complete physical.  Date of last medicare wellness visit is unknown.  She is on cholesterol medication and denies myalgias. Her cholesterol is at goal. The cholesterol last visit was:  Lab Results  Component Value Date   CHOL 174 11/08/2013   HDL 72 11/08/2013   LDLCALC 84 11/08/2013   TRIG 90 11/08/2013   CHOLHDL 2.4 11/08/2013    She has had prediabetes for 2 years (2014). She has been working on diet and exercise for prediabetes, and denies foot ulcerations, hyperglycemia, hypoglycemia , increased appetite, nausea, paresthesia of the feet, polydipsia, polyuria, visual disturbances, vomiting and weight loss. Last A1C in the office was:  Lab Results  Component Value Date   HGBA1C 6.0* 11/08/2013    Patient is on Vitamin D supplement.   Lab Results  Component Value Date   VD25OH 50 07/27/2013     Patient does also have concerns about ear wax impaction and feels that she can't hear very well.  She wants to have them cleaned out if possible.     Names of Other Physician/Practitioners you currently use: 1. [facility] here for primary care 2. [place], eye doctor, last visit 2015 3. [place], dentist, last visit 2015  Patient Care Team: Unk Pinto, MD as PCP - General (Internal Medicine) [name], MD as Consulting Physician (Gastroenterology) [name], MD as Referring Physician (Orthopedic Surgery) [name], MD as Consulting Physician (Dermatology) [name], MD as Consulting Physician (Oncology) Griselda Miner, MD as Consulting Physician (Dermatology)  Medication  Review: Current Outpatient Prescriptions on File Prior to Visit  Medication Sig Dispense Refill  .  aspirin 81 MG tablet Take 81 mg by mouth daily.    . calcium carbonate (OS-CAL) 600 MG TABS Take 600 mg by mouth 2 (two) times daily with a meal.     . Cholecalciferol (VITAMIN D) 1000 UNITS capsule Take 1,000 Units by mouth 2 (two) times daily.     Marland Kitchen LYSINE HCL PO Take 500 mg by mouth daily.     . Multiple Vitamins-Minerals (MULTIVITAMIN PO) Take by mouth daily.    . Omega-3 Fatty Acids (FISH OIL PO) Take by mouth daily. Mega Red     No current facility-administered medications on file prior to visit.    Current Problems (verified) Patient Active Problem List   Diagnosis Date Noted  . Breast cancer, right breast 10/25/2013  . Encounter for long-term (current) use of medications 04/19/2013  . Hyperlipidemia   . Prediabetes   . Vitamin D deficiency     Screening Tests Health Maintenance  Topic Date Due  . COLONOSCOPY  02/13/2012  . INFLUENZA VACCINE  08/22/2014  . TETANUS/TDAP  02/13/2019  . DEXA SCAN  Completed  . ZOSTAVAX  Completed  . PNA vac Low Risk Adult  Completed    Immunization History  Administered Date(s) Administered  . Influenza, High Dose Seasonal PF 11/08/2013  . Influenza-Unspecified 11/10/2012  . Pneumococcal Conjugate-13 07/27/2013  . Pneumococcal-Unspecified 11/30/2010  . Td 02/12/2009  . Zoster 02/12/2006    Preventative care:   History reviewed: allergies, current medications, past family history, past medical history, past social history, past surgical history and problem list  Risk Factors: Tobacco History  Substance Use Topics  . Smoking status: Never Smoker   . Smokeless tobacco: Never Used  . Alcohol Use: 2.0 oz/week    4 drink(s) per week     Comment: wine   She does not smoke.  Patient is not a former smoker. Are there smokers in your home (other than you)?  No Alcohol Current alcohol use: glass of wine with dinner  Caffeine Current caffeine use: coffee 2 /day  Exercise Current exercise: walking and yard  work  Nutrition/Diet Current diet: in general, a "healthy" diet    Cardiac risk factors: advanced age (older than 47 for men, 77 for women), dyslipidemia and sedentary lifestyle.  Depression Screen (Note: if answer to either of the following is "Yes", a more complete depression screening is indicated)   Q1: Over the past two weeks, have you felt down, depressed or hopeless? No  Q2: Over the past two weeks, have you felt little interest or pleasure in doing things? No  Have you lost interest or pleasure in daily life? No  Do you often feel hopeless? No  Do you cry easily over simple problems? No  Activities of Daily Living In your present state of health, do you have any difficulty performing the following activities?:  Driving? No Managing money?  No Feeding yourself? No Getting from bed to chair? No Climbing a flight of stairs? No Preparing food and eating?: No Bathing or showering? No Getting dressed: No Getting to the toilet? No Using the toilet:No Moving around from place to place: No In the past year have you fallen or had a near fall?:No   Are you sexually active?  No  Do you have more than one partner?  No  Vision Difficulties: No  Hearing Difficulties: No Do you often ask people to speak up or  repeat themselves? No Do you experience ringing or noises in your ears? No Do you have difficulty understanding soft or whispered voices? Sometimes.  Cognition  Do you feel that you have a problem with memory?No  Do you often misplace items? No  Do you feel safe at home?  Yes  Advanced directives Does patient have a [place]? No Does patient have a Living Will? No  Past Medical History  Diagnosis Date  . Hyperlipidemia   . Prediabetes   . Vitamin D deficiency   . Arthritis     false + RF, negative anticcp  . Cancer     right breast cancer  . Melanoma 2014    right humerus   Past Surgical History  Procedure Laterality Date  . Breast  lumpectomy Right 2012    with radiation no chemo  . Cataract extraction Bilateral 2009  . Cesarean section    . Melanoma excision Right 2014    humerus   Review of Systems  Constitutional: Negative for fever, chills, weight loss and malaise/fatigue.  HENT: Positive for hearing loss. Negative for congestion, ear discharge, ear pain, nosebleeds, sore throat and tinnitus.   Eyes: Negative for blurred vision and double vision.  Respiratory: Negative for cough, sputum production, shortness of breath and wheezing.   Cardiovascular: Negative for chest pain, palpitations and leg swelling.  Gastrointestinal: Negative for heartburn, nausea, vomiting, abdominal pain, diarrhea, constipation, blood in stool and melena.  Genitourinary: Negative for dysuria, urgency, frequency and hematuria.  Musculoskeletal: Negative for joint pain.  Skin: Negative.   Neurological: Negative for dizziness, sensory change, speech change, loss of consciousness, weakness and headaches.  Psychiatric/Behavioral: Negative for depression and substance abuse.     Objective:     BP 136/64 mmHg  Pulse 84  Temp(Src) 98.6 F (37 C) (Temporal)  Resp 16  Ht 5\' 3"  (1.6 m)  Wt 137 lb (62.143 kg)  BMI 24.27 kg/m2  General Appearance: Well nourished, alert, WD/WN, female and in no apparent distress. Eyes: PERRLA, EOMs, conjunctiva no swelling or erythema, normal fundi and vessels. Sinuses: No frontal/maxillary tenderness ENT/Mouth: Bilateral ear canals with cerumen impactions.  TM's normal once impactions removed. Nares clear without erythema, swelling, mucoid exudates. Oral hygiene is good. No erythema, swelling, or exudate. Tongue normal, non-obstructing. Tonsils not swollen or erythematous. Hearing normal.  Neck: Supple, thyroid normal. No bruits, nodes or JVD. Respiratory: Respiratory effort normal.  BS equal and clear bilateral without rales, rhonci, wheezing or stridor. Cardio: Heart sounds are normal with regular rate  and rhythm and no murmurs, rubs or gallops. Peripheral pulses are normal and equal bilaterally without edema. No aortic or femoral bruits. Chest: symmetric with normal excursions and percussion. Abdomen: Flat, soft  with nl bowel sounds. Nontender, no guarding, rebound, hernias, masses, or organomegaly.  Lymphatics: Non tender without lymphadenopathy.  Musculoskeletal: Full ROM all peripheral extremities, joint stability, 5/5 strength, and normal gait. Skin: Warm and dry without rashes, lesions, cyanosis, clubbing or  ecchymosis.  Neuro: Cranial nerves intact, reflexes equal bilaterally. Normal muscle tone, no cerebellar symptoms. Sensation intact.  Pysch: Alert and oriented X 3, normal affect, Insight and Judgment appropriate.   Cognitive Testing  Alert? Yes  Normal Appearance?Yes  Oriented to person? Yes  Place? Yes   Time? Yes  Recall of three objects?  Yes  Can perform simple calculations? Yes  Displays appropriate judgment? Yes  Can read the correct time from a watch/clock?Yes  Medicare Attestation I have personally reviewed: The  patient's medical and social history Their use of alcohol, tobacco or illicit drugs Their current medications and supplements The patient's functional ability including ADLs,fall risks, home safety risks, cognitive, and hearing and visual impairment Diet and physical activities Evidence for depression or mood disorders  The patient's weight, height, BMI, and visual acuity have been recorded in the chart.  I have made referrals, counseling, and provided education to the patient based on review of the above and I have provided the patient with a written personalized care plan for preventive services.  Over 40 minutes of exam, counseling, chart review was performed.   Starlyn Skeans, PA-C   04/27/2014

## 2014-04-28 LAB — CBC WITH DIFFERENTIAL/PLATELET
BASOS ABS: 0 10*3/uL (ref 0.0–0.1)
Basophils Relative: 0 % (ref 0–1)
EOS ABS: 0.2 10*3/uL (ref 0.0–0.7)
EOS PCT: 3 % (ref 0–5)
HCT: 39.2 % (ref 36.0–46.0)
Hemoglobin: 12.8 g/dL (ref 12.0–15.0)
LYMPHS PCT: 22 % (ref 12–46)
Lymphs Abs: 1.6 10*3/uL (ref 0.7–4.0)
MCH: 30.7 pg (ref 26.0–34.0)
MCHC: 32.7 g/dL (ref 30.0–36.0)
MCV: 94 fL (ref 78.0–100.0)
MPV: 10.7 fL (ref 8.6–12.4)
Monocytes Absolute: 0.9 10*3/uL (ref 0.1–1.0)
Monocytes Relative: 12 % (ref 3–12)
Neutro Abs: 4.5 10*3/uL (ref 1.7–7.7)
Neutrophils Relative %: 63 % (ref 43–77)
PLATELETS: 265 10*3/uL (ref 150–400)
RBC: 4.17 MIL/uL (ref 3.87–5.11)
RDW: 13.1 % (ref 11.5–15.5)
WBC: 7.2 10*3/uL (ref 4.0–10.5)

## 2014-04-28 LAB — BASIC METABOLIC PANEL WITH GFR
BUN: 16 mg/dL (ref 6–23)
CO2: 28 mEq/L (ref 19–32)
Calcium: 8.9 mg/dL (ref 8.4–10.5)
Chloride: 106 mEq/L (ref 96–112)
Creat: 0.62 mg/dL (ref 0.50–1.10)
GFR, Est African American: >89 mL/min
GFR, Est Non African American: 83 mL/min
GLUCOSE: 86 mg/dL (ref 70–99)
Potassium: 3.6 mEq/L (ref 3.5–5.3)
Sodium: 143 mEq/L (ref 135–145)

## 2014-04-28 LAB — LIPID PANEL
Cholesterol: 178 mg/dL (ref 0–200)
HDL: 70 mg/dL (ref >=46)
LDL CALC: 92 mg/dL (ref 0–99)
TRIGLYCERIDES: 78 mg/dL (ref <150)
Total CHOL/HDL Ratio: 2.5 Ratio
VLDL: 16 mg/dL (ref 0–40)

## 2014-04-28 LAB — MAGNESIUM: Magnesium: 1.7 mg/dL (ref 1.5–2.5)

## 2014-04-28 LAB — HEPATIC FUNCTION PANEL
ALK PHOS: 56 U/L (ref 39–117)
ALT: 15 U/L (ref 0–35)
AST: 17 U/L (ref 0–37)
Albumin: 3.9 g/dL (ref 3.5–5.2)
BILIRUBIN DIRECT: 0.1 mg/dL (ref 0.0–0.3)
BILIRUBIN INDIRECT: 0.4 mg/dL (ref 0.2–1.2)
TOTAL PROTEIN: 6.3 g/dL (ref 6.0–8.3)
Total Bilirubin: 0.5 mg/dL (ref 0.2–1.2)

## 2014-04-28 LAB — INSULIN, FASTING: INSULIN FASTING, SERUM: 21 u[IU]/mL — AB (ref 2.0–19.6)

## 2014-04-28 LAB — TSH: TSH: 2.26 u[IU]/mL (ref 0.350–4.500)

## 2014-04-28 LAB — HEMOGLOBIN A1C
Hgb A1c MFr Bld: 6 % — ABNORMAL HIGH (ref <5.7)
MEAN PLASMA GLUCOSE: 126 mg/dL — AB (ref <117)

## 2014-04-28 LAB — VITAMIN D 25 HYDROXY (VIT D DEFICIENCY, FRACTURES): VIT D 25 HYDROXY: 61 ng/mL (ref 30–100)

## 2014-04-29 ENCOUNTER — Other Ambulatory Visit: Payer: Self-pay

## 2014-04-29 DIAGNOSIS — Z1231 Encounter for screening mammogram for malignant neoplasm of breast: Secondary | ICD-10-CM

## 2014-06-07 ENCOUNTER — Other Ambulatory Visit: Payer: Self-pay | Admitting: Oncology

## 2014-06-07 DIAGNOSIS — Z1231 Encounter for screening mammogram for malignant neoplasm of breast: Secondary | ICD-10-CM

## 2014-06-08 ENCOUNTER — Ambulatory Visit
Admission: RE | Admit: 2014-06-08 | Discharge: 2014-06-08 | Disposition: A | Discharge status: Home / Self Care | Payer: Medicare Other | Source: Ambulatory Visit

## 2014-06-08 ENCOUNTER — Other Ambulatory Visit: Payer: Self-pay | Admitting: Oncology

## 2014-06-08 DIAGNOSIS — Z853 Personal history of malignant neoplasm of breast: Secondary | ICD-10-CM

## 2014-07-20 ENCOUNTER — Encounter: Payer: Self-pay | Admitting: Internal Medicine

## 2014-07-20 ENCOUNTER — Ambulatory Visit (INDEPENDENT_AMBULATORY_CARE_PROVIDER_SITE_OTHER): Payer: Medicare Other | Admitting: Internal Medicine

## 2014-07-20 VITALS — BP 124/66 | HR 88 | Temp 98.2°F | Resp 16 | Ht 63.0 in | Wt 133.0 lb

## 2014-07-20 DIAGNOSIS — J069 Acute upper respiratory infection, unspecified: Secondary | ICD-10-CM

## 2014-07-20 MED ORDER — AZITHROMYCIN 250 MG PO TABS: ORAL_TABLET | ORAL | Status: DC

## 2014-07-20 MED ORDER — GUAIFENESIN 100 MG/5ML PO SOLN: 5.0000 mL | ORAL | Status: DC | PRN

## 2014-07-20 MED ORDER — PREDNISONE 20 MG PO TABS: ORAL_TABLET | ORAL | Status: DC

## 2014-07-20 NOTE — Progress Notes (Signed)
   Subjective:    Patient ID: Victoria Long, female    DOB: May 25, 1928, 81 y.o.   MRN: [id number]  Sore Throat  Associated symptoms include coughing. Pertinent negatives include no congestion, ear pain, shortness of breath or trouble swallowing.  Cough Associated symptoms include a sore throat. Pertinent negatives include no chills, ear pain, fever, postnasal drip, rhinorrhea, shortness of breath or wheezing.  Patient reports to the office for evaluation of cough, sore throat, and congestion.  This has been going on for a little less than a week now.  She has been taking sudafed and cough syrups with no relief.  Nothing makes her feel any worse.      Review of Systems  Constitutional: Positive for fatigue. Negative for fever and chills.  HENT: Positive for sore throat. Negative for congestion, ear pain, hearing loss, postnasal drip, rhinorrhea, sinus pressure and trouble swallowing.   Respiratory: Positive for cough. Negative for chest tightness, shortness of breath and wheezing.        Objective:   Physical Exam  Constitutional: She is oriented to person, place, and time. She appears well-developed and well-nourished. No distress.  HENT:  Head: Normocephalic.  Nose: Mucosal edema present.  Mouth/Throat: Oropharynx is clear and moist and mucous membranes are normal. No trismus in the jaw. No oropharyngeal exudate.  Eyes: Conjunctivae are normal. No scleral icterus.  Neck: Normal range of motion. Neck supple. No JVD present. No thyromegaly present.  Cardiovascular: Normal rate, regular rhythm, normal heart sounds and intact distal pulses.  Exam reveals no gallop and no friction rub.   No murmur heard. Pulmonary/Chest: Effort normal and breath sounds normal. No respiratory distress. She has no wheezes. She has no rales. She exhibits no tenderness.  Abdominal: Soft. Bowel sounds are normal.  Musculoskeletal: Normal range of motion.  Lymphadenopathy:    She has no cervical adenopathy.   Neurological: She is alert and oriented to person, place, and time.  Skin: Skin is warm and dry. She is not diaphoretic.  Psychiatric: She has a normal mood and affect. Her behavior is normal. Judgment and thought content normal.  Nursing note and vitals reviewed.         Assessment & Plan:    1. Acute upper respiratory infection  - guaiFENesin (ROBITUSSIN) 100 MG/5ML SOLN; Take 5 mLs (100 mg total) by mouth every 4 (four) hours as needed for cough or to loosen phlegm.  Dispense: 1200 mL; Refill: 0 - predniSONE (DELTASONE) 20 MG tablet; 3 tabs po day one, then 2 tabs daily x 4 days  Dispense: 11 tablet; Refill: 0 -zpak if needed if no relief -zyrtec

## 2014-07-20 NOTE — Patient Instructions (Addendum)
Please take the prednisone for the next five days until it is gone.  You can use some nasal saline in your nose if you develop any congestion.  Please use zyrtec nightly until you have complete relief of your symptoms.  You can use the cough syrup twice daily to help with coughing.  Please take zpak if you have no relief of symptoms in 3-5 days or if you develop worsening symptoms.

## 2014-08-09 ENCOUNTER — Encounter: Payer: Self-pay | Admitting: Internal Medicine

## 2014-08-09 ENCOUNTER — Ambulatory Visit (INDEPENDENT_AMBULATORY_CARE_PROVIDER_SITE_OTHER): Payer: Medicare Other | Admitting: Internal Medicine

## 2014-08-09 ENCOUNTER — Encounter: Payer: Self-pay | Admitting: Emergency Medicine

## 2014-08-09 VITALS — BP 122/64 | HR 70 | Temp 98.2°F | Resp 16 | Ht 63.5 in | Wt 134.0 lb

## 2014-08-09 DIAGNOSIS — D539 Nutritional anemia, unspecified: Secondary | ICD-10-CM

## 2014-08-09 DIAGNOSIS — Z1212 Encounter for screening for malignant neoplasm of rectum: Secondary | ICD-10-CM

## 2014-08-09 DIAGNOSIS — Z79899 Other long term (current) drug therapy: Secondary | ICD-10-CM

## 2014-08-09 DIAGNOSIS — R030 Elevated blood-pressure reading, without diagnosis of hypertension: Secondary | ICD-10-CM

## 2014-08-09 DIAGNOSIS — R7303 Prediabetes: Secondary | ICD-10-CM

## 2014-08-09 DIAGNOSIS — E785 Hyperlipidemia, unspecified: Secondary | ICD-10-CM

## 2014-08-09 DIAGNOSIS — IMO0001 Reserved for inherently not codable concepts without codable children: Secondary | ICD-10-CM

## 2014-08-09 DIAGNOSIS — C50911 Malignant neoplasm of unspecified site of right female breast: Secondary | ICD-10-CM

## 2014-08-09 DIAGNOSIS — E559 Vitamin D deficiency, unspecified: Secondary | ICD-10-CM

## 2014-08-09 DIAGNOSIS — M542 Cervicalgia: Secondary | ICD-10-CM

## 2014-08-09 DIAGNOSIS — Z0000 Encounter for general adult medical examination without abnormal findings: Secondary | ICD-10-CM

## 2014-08-09 DIAGNOSIS — I10 Essential (primary) hypertension: Secondary | ICD-10-CM

## 2014-08-09 LAB — IRON AND TIBC
%SAT: 19 % — ABNORMAL LOW (ref 20–55)
Iron: 64 ug/dL (ref 42–145)
TIBC: 336 ug/dL (ref 250–470)
UIBC: 272 ug/dL (ref 125–400)

## 2014-08-09 LAB — CBC WITH DIFFERENTIAL/PLATELET
Basophils Absolute: 0 10*3/uL (ref 0.0–0.1)
Basophils Relative: 0 % (ref 0–1)
Eosinophils Absolute: 0.1 10*3/uL (ref 0.0–0.7)
Eosinophils Relative: 2 % (ref 0–5)
HCT: 39 % (ref 36.0–46.0)
Hemoglobin: 12.8 g/dL (ref 12.0–15.0)
Lymphocytes Relative: 28 % (ref 12–46)
Lymphs Abs: 1.6 10*3/uL (ref 0.7–4.0)
MCH: 31.1 pg (ref 26.0–34.0)
MCHC: 32.8 g/dL (ref 30.0–36.0)
MCV: 94.7 fL (ref 78.0–100.0)
MONOS PCT: 11 % (ref 3–12)
MPV: 10.4 fL (ref 8.6–12.4)
Monocytes Absolute: 0.6 10*3/uL (ref 0.1–1.0)
NEUTROS ABS: 3.4 10*3/uL (ref 1.7–7.7)
Neutrophils Relative %: 59 % (ref 43–77)
PLATELETS: 272 10*3/uL (ref 150–400)
RBC: 4.12 MIL/uL (ref 3.87–5.11)
RDW: 13.3 % (ref 11.5–15.5)
WBC: 5.7 10*3/uL (ref 4.0–10.5)

## 2014-08-09 LAB — BASIC METABOLIC PANEL WITH GFR
BUN: 16 mg/dL (ref 6–23)
CHLORIDE: 104 meq/L (ref 96–112)
CO2: 27 mEq/L (ref 19–32)
Calcium: 9.3 mg/dL (ref 8.4–10.5)
Creat: 0.58 mg/dL (ref 0.50–1.10)
GFR, Est African American: >89 mL/min
GFR, Est Non African American: 85 mL/min
Glucose, Bld: 85 mg/dL (ref 70–99)
Potassium: 3.9 mEq/L (ref 3.5–5.3)
SODIUM: 142 meq/L (ref 135–145)

## 2014-08-09 LAB — LIPID PANEL
CHOL/HDL RATIO: 3 ratio
Cholesterol: 188 mg/dL (ref 0–200)
HDL: 62 mg/dL (ref >=46)
LDL Cholesterol: 102 mg/dL — ABNORMAL HIGH (ref 0–99)
TRIGLYCERIDES: 121 mg/dL (ref <150)
VLDL: 24 mg/dL (ref 0–40)

## 2014-08-09 LAB — TSH: TSH: 0.941 u[IU]/mL (ref 0.350–4.500)

## 2014-08-09 LAB — HEPATIC FUNCTION PANEL
ALT: 13 U/L (ref 0–35)
AST: 16 U/L (ref 0–37)
Albumin: 3.7 g/dL (ref 3.5–5.2)
Alkaline Phosphatase: 54 U/L (ref 39–117)
BILIRUBIN DIRECT: 0.1 mg/dL (ref 0.0–0.3)
BILIRUBIN TOTAL: 0.5 mg/dL (ref 0.2–1.2)
Indirect Bilirubin: 0.4 mg/dL (ref 0.2–1.2)
Total Protein: 6.4 g/dL (ref 6.0–8.3)

## 2014-08-09 LAB — VITAMIN B12: VITAMIN B 12: 789 pg/mL (ref 211–911)

## 2014-08-09 LAB — MAGNESIUM: MAGNESIUM: 2 mg/dL (ref 1.5–2.5)

## 2014-08-09 MED ORDER — DIAZEPAM 2 MG PO TABS: 2.0000 mg | ORAL_TABLET | Freq: Three times a day (TID) | ORAL | Status: DC | PRN

## 2014-08-09 NOTE — Progress Notes (Signed)
Complete Physical  Assessment and Plan:  1. Prediabetes  - Hemoglobin A1c - Insulin, random  2. Hyperlipidemia -patient would prefer to continue medication currently - Lipid panel  3. Vitamin D deficiency -cont supplement with calcium intake - Vit D  25 hydroxy (rtn osteoporosis monitoring)  4. Encounter for long-term (current) use of medications  - CBC with Differential/Platelet - BASIC METABOLIC PANEL WITH GFR - Hepatic function panel - Magnesium  5. Breast cancer, right breast -followed by [facility] -currently in remission  6. Elevated BP  - Urinalysis, Routine w reflex microscopic (not at [facility]) - Microalbumin / creatinine urine ratio - EKG 12-Lead - TSH  7. Deficiency anemia  - Iron and TIBC - Vitamin B12  8. Screening for rectal cancer  - POC Hemoccult Bld/Stl (3-Cd Home Screen); Future  9. Neck pain -likely muscle spasm.  No neuro defects -valium -tylenol or advil prn    Discussed med's effects and SE's. Screening labs and tests as requested with regular follow-up as recommended.  HPI  81 y.o. female  presents for a complete physical.  Her blood pressure has been controlled at home, today their BP is BP: 122/64 mmHg.  She does not workout. She denies chest pain, shortness of breath, dizziness. She reports that they have tried some water aerobics and doing some gentle stretching in the mornings.     She is on cholesterol medication and denies myalgias. Her cholesterol is at goal. The cholesterol last visit was:  Lab Results  Component Value Date   CHOL 178 04/27/2014   HDL 70 04/27/2014   LDLCALC 92 04/27/2014   TRIG 78 04/27/2014   CHOLHDL 2.5 04/27/2014  .  She has been working on diet and exercise for prediabetes, she is on bASA, she is not on ACE/ARB and denies foot ulcerations, hyperglycemia, hypoglycemia , increased appetite, nausea, paresthesia of the feet, polydipsia, polyuria, visual disturbances, vomiting and weight loss. Last A1C  in the office was:  Lab Results  Component Value Date   HGBA1C 6.0* 04/27/2014  She has been trying to eat veggies and fruit.  She does report the occasional burger or pasta dish.  She does report some sweets every now and then.    Patient is on Vitamin D supplement.   Lab Results  Component Value Date   VD25OH 61 04/27/2014     Patient does report that the left side of her neck has been bothering her for the past two days.  She woke up with this stiff neck.  She reports trying to improve her posture, using heat, and using muscle rubs with no relief.  She does have pain with movement.  She has had some neck stiffness and soreness in the past.    Current Medications:  Current Outpatient Prescriptions on File Prior to Visit  Medication Sig Dispense Refill  . aspirin 81 MG tablet Take 81 mg by mouth daily.    Marland Kitchen atorvastatin (LIPITOR) 80 MG tablet 1/2 to 1 tablet daily or as directed for cholesterol 30 tablet 3  . calcium carbonate (OS-CAL) 600 MG TABS Take 600 mg by mouth 2 (two) times daily with a meal.     . Cholecalciferol (VITAMIN D) 1000 UNITS capsule Take 1,000 Units by mouth 2 (two) times daily.     Marland Kitchen LYSINE HCL PO Take 500 mg by mouth daily.     . Multiple Vitamins-Minerals (MULTIVITAMIN PO) Take by mouth daily.    . Omega-3 Fatty Acids (FISH OIL PO) Take by mouth  daily. Mega Red     No current facility-administered medications on file prior to visit.    Health Maintenance:   Immunization History  Administered Date(s) Administered  . Influenza, High Dose Seasonal PF 11/08/2013  . Influenza-Unspecified 11/10/2012  . Pneumococcal Conjugate-13 07/27/2013  . Pneumococcal-Unspecified 11/30/2010  . Td 02/12/2009  . Zoster 02/12/2006    Patient Care Team: Unk Pinto, MD as PCP - General (Internal Medicine) [name], MD as Consulting Physician (Gastroenterology) [name], MD as Referring Physician (Orthopedic Surgery) [name], MD as Consulting Physician  (Dermatology) [name], MD as Consulting Physician (Oncology) Griselda Miner, MD as Consulting Physician (Dermatology)  Allergies: No Known Allergies  Medical History:  Past Medical History  Diagnosis Date  . Hyperlipidemia   . Prediabetes   . Vitamin D deficiency   . Arthritis     false + RF, negative anticcp  . Cancer     right breast cancer  . Melanoma 2014    right humerus    Surgical History:  Past Surgical History  Procedure Laterality Date  . Breast lumpectomy Right 2012    with radiation no chemo  . Cataract extraction Bilateral 2009  . Cesarean section    . Melanoma excision Right 2014    humerus    Family History:  Family History  Problem Relation Age of Onset  . Arthritis Mother   . Rheum arthritis Mother   . Hypertension Sister   . Cancer Sister   . Diabetes Maternal Grandmother     Social History:  History  Substance Use Topics  . Smoking status: Never Smoker   . Smokeless tobacco: Never Used  . Alcohol Use: 2.0 oz/week    4 drink(s) per week     Comment: wine    Review of Systems: Review of Systems  Constitutional: Negative for fever, chills and malaise/fatigue.  HENT: Positive for sore throat. Negative for congestion and ear pain.   Eyes: Negative.   Respiratory: Negative for cough, shortness of breath and wheezing.   Cardiovascular: Negative for chest pain, palpitations and leg swelling.  Gastrointestinal: Negative for heartburn, nausea, vomiting, diarrhea, constipation, blood in stool and melena.  Genitourinary: Negative for dysuria, urgency and frequency.  Musculoskeletal: Negative.   Skin: Negative.   Neurological: Negative for dizziness, sensory change, loss of consciousness and headaches.  Psychiatric/Behavioral: Negative for depression. The patient is not nervous/anxious and does not have insomnia.     Physical Exam: Estimated body mass index is 23.36 kg/(m^2) as calculated from the following:   Height as of this  encounter: 5' 3.5" (1.613 m).   Weight as of this encounter: 134 lb (60.782 kg). BP 122/64 mmHg  Pulse 70  Temp(Src) 98.2 F (36.8 C) (Temporal)  Resp 16  Ht 5' 3.5" (1.613 m)  Wt 134 lb (60.782 kg)  BMI 23.36 kg/m2  General Appearance: Well nourished well developed, in no apparent distress.  Eyes: PERRLA, EOMs, conjunctiva no swelling or erythema ENT/Mouth: Ear canals normal without obstruction, swelling, erythema, or discharge.  TMs normal bilaterally with no erythema, bulging, retraction, or loss of landmark.  Oropharynx moist and clear with no exudate, erythema, or swelling.   Neck: Supple, thyroid normal. No bruits.  No cervical adenopathy Respiratory: Respiratory effort normal, Breath sounds clear A&P without wheeze, rhonchi, rales.   Cardio: RRR without murmurs, rubs or gallops. Brisk peripheral pulses without edema.  Chest: symmetric, with normal excursions Breasts: Symmetric, with well healed scar to the right breast and skin changes  consistent with radiation, dense fibrocystic breasts bilaterally, nipple discharge, retractions.  Abdomen: Soft, nontender, no guarding, rebound, hernias, masses, or organomegaly.  Lymphatics: Non tender without lymphadenopathy.  Genitourinary: Deferred Musculoskeletal: Full ROM all peripheral extremities,5/5 strength, and normal gait. Patient rises slowly from sitting to standing.  They walk without an antalgic gait.  There is no evidence of erythema, ecchymosis, or gross deformity.  There is tenderness to palpation over cervical spine or paraspinal muscles.  Palpable spasm of trapezius of the left.  Active ROM is limited due to pain.  Sensation to light touch is intact over all extremities.  Strength is symmetric and equal in all extremities.   Skin: Warm, dry without rashes, lesions, ecchymosis. Neuro: Awake and oriented X 3, Cranial nerves intact, reflexes equal bilaterally. Normal muscle tone, no cerebellar symptoms. Sensation intact.  Psych:   normal affect, Insight and Judgment appropriate.   EKG: WNL no changes.  AORTA SCAN: WNL   Over 40 minutes of exam, counseling, chart review and critical decision making was performed  Loma Sousa Forcucci 2:28 PM [facility]

## 2014-08-09 NOTE — Patient Instructions (Signed)
Preventive Care for Adults  A healthy lifestyle and preventive care can promote health and wellness. Preventive health guidelines for women include the following key practices.  A routine yearly physical is a good way to check with your health care provider about your health and preventive screening. It is a chance to share any concerns and updates on your health and to receive a thorough exam.  Visit your dentist for a routine exam and preventive care every 6 months. Brush your teeth twice a day and floss once a day. Good oral hygiene prevents tooth decay and gum disease.  The frequency of eye exams is based on your age, health, family medical history, use of contact lenses, and other factors. Follow your health care provider's recommendations for frequency of eye exams.  Eat a healthy diet. Foods like vegetables, fruits, whole grains, low-fat dairy products, and lean protein foods contain the nutrients you need without too many calories. Decrease your intake of foods high in solid fats, added sugars, and salt. Eat the right amount of calories for you.Get information about a proper diet from your health care provider, if necessary.  Regular physical exercise is one of the most important things you can do for your health. Most adults should get at least 150 minutes of moderate-intensity exercise (any activity that increases your heart rate and causes you to sweat) each week. In addition, most adults need muscle-strengthening exercises on 2 or more days a week.  Maintain a healthy weight. The body mass index (BMI) is a screening tool to identify possible weight problems. It provides an estimate of body fat based on height and weight. Your health care provider can find your BMI and can help you achieve or maintain a healthy weight.For adults 20 years and older:  A BMI below 18.5 is considered underweight.  A BMI of 18.5 to 24.9 is normal.  A BMI of 25 to 29.9 is considered overweight.  A BMI  of 30 and above is considered obese.  Maintain normal blood lipids and cholesterol levels by exercising and minimizing your intake of saturated fat. Eat a balanced diet with plenty of fruit and vegetables. If your lipid or cholesterol levels are high, you are over 50, or you are at high risk for heart disease, you may need your cholesterol levels checked more frequently.Ongoing high lipid and cholesterol levels should be treated with medicines if diet and exercise are not working.  If you smoke, find out from your health care provider how to quit. If you do not use tobacco, do not start.  Lung cancer screening is recommended for adults aged 2-80 years who are at high risk for developing lung cancer because of a history of smoking. A yearly low-dose CT scan of the lungs is recommended for people who have at least a 30-pack-year history of smoking and are a current smoker or have quit within the past 15 years. A pack year of smoking is smoking an average of 1 pack of cigarettes a day for 1 year (for example: 1 pack a day for 30 years or 2 packs a day for 15 years). Yearly screening should continue until the smoker has stopped smoking for at least 15 years. Yearly screening should be stopped for people who develop a health problem that would prevent them from having lung cancer treatment.  Avoid use of street drugs. Do not share needles with anyone. Ask for help if you need support or instructions about stopping the use of drugs.  High  blood pressure causes heart disease and increases the risk of stroke.  Ongoing high blood pressure should be treated with medicines if weight loss and exercise do not work.  If you are 55-79 years old, ask your health care provider if you should take aspirin to prevent strokes.  Diabetes screening involves taking a blood sample to check your fasting blood sugar level. This should be done once every 3 years, after age 45, if you are within normal weight and without risk  factors for diabetes. Testing should be considered at a younger age or be carried out more frequently if you are overweight and have at least 1 risk factor for diabetes.  Breast cancer screening is essential preventive care for women. You should practice "breast self-awareness." This means understanding the normal appearance and feel of your breasts and may include breast self-examination. Any changes detected, no matter how small, should be reported to a health care provider. Women in their 20s and 30s should have a clinical breast exam (CBE) by a health care provider as part of a regular health exam every 1 to 3 years. After age 40, women should have a CBE every year. Starting at age 40, women should consider having a mammogram (breast X-ray test) every year. Women who have a family history of breast cancer should talk to their health care provider about genetic screening. Women at a high risk of breast cancer should talk to their health care providers about having an MRI and a mammogram every year.  Breast cancer gene (BRCA)-related cancer risk assessment is recommended for women who have family members with BRCA-related cancers. BRCA-related cancers include breast, ovarian, tubal, and peritoneal cancers. Having family members with these cancers may be associated with an increased risk for harmful changes (mutations) in the breast cancer genes BRCA1 and BRCA2. Results of the assessment will determine the need for genetic counseling and BRCA1 and BRCA2 testing.  Routine pelvic exams to screen for cancer are no longer recommended for nonpregnant women who are considered low risk for cancer of the pelvic organs (ovaries, uterus, and vagina) and who do not have symptoms. Ask your health care provider if a screening pelvic exam is right for you.  If you have had past treatment for cervical cancer or a condition that could lead to cancer, you need Pap tests and screening for cancer for at least 20 years after  your treatment. If Pap tests have been discontinued, your risk factors (such as having a new sexual partner) need to be reassessed to determine if screening should be resumed. Some women have medical problems that increase the chance of getting cervical cancer. In these cases, your health care provider may recommend more frequent screening and Pap tests.    Colorectal cancer can be detected and often prevented. Most routine colorectal cancer screening begins at the age of 50 years and continues through age 75 years. However, your health care provider may recommend screening at an earlier age if you have risk factors for colon cancer. On a yearly basis, your health care provider may provide home test kits to check for hidden blood in the stool. Use of a small camera at the end of a tube, to directly examine the colon (sigmoidoscopy or colonoscopy), can detect the earliest forms of colorectal cancer. Talk to your health care provider about this at age 50, when routine screening begins. Direct exam of the colon should be repeated every 5-10 years through age 75 years, unless early forms of pre-cancerous   polyps or small growths are found.  Osteoporosis is a disease in which the bones lose minerals and strength with aging. This can result in serious bone fractures or breaks. The risk of osteoporosis can be identified using a bone density scan. Women ages 68 years and over and women at risk for fractures or osteoporosis should discuss screening with their health care providers. Ask your health care provider whether you should take a calcium supplement or vitamin D to reduce the rate of osteoporosis.  Menopause can be associated with physical symptoms and risks. Hormone replacement therapy is available to decrease symptoms and risks. You should talk to your health care provider about whether hormone replacement therapy is right for you.  Use sunscreen. Apply sunscreen liberally and repeatedly throughout the day.  You should seek shade when your shadow is shorter than you. Protect yourself by wearing long sleeves, pants, a wide-brimmed hat, and sunglasses year round, whenever you are outdoors.  Once a month, do a whole body skin exam, using a mirror to look at the skin on your back. Tell your health care provider of new moles, moles that have irregular borders, moles that are larger than a pencil eraser, or moles that have changed in shape or color.  Stay current with required vaccines (immunizations).  Influenza vaccine. All adults should be immunized every year.  Tetanus, diphtheria, and acellular pertussis (Td, Tdap) vaccine. Pregnant women should receive 1 dose of Tdap vaccine during each pregnancy. The dose should be obtained regardless of the length of time since the last dose. Immunization is preferred during the 27th-36th week of gestation. An adult who has not previously received Tdap or who does not know her vaccine status should receive 1 dose of Tdap. This initial dose should be followed by tetanus and diphtheria toxoids (Td) booster doses every 10 years. Adults with an unknown or incomplete history of completing a 3-dose immunization series with Td-containing vaccines should begin or complete a primary immunization series including a Tdap dose. Adults should receive a Td booster every 10 years.    Zoster vaccine. One dose is recommended for adults aged 36 years or older unless certain conditions are present.    Pneumococcal 13-valent conjugate (PCV13) vaccine. When indicated, a person who is uncertain of her immunization history and has no record of immunization should receive the PCV13 vaccine. An adult aged 1 years or older who has certain medical conditions and has not been previously immunized should receive 1 dose of PCV13 vaccine. This PCV13 should be followed with a dose of pneumococcal polysaccharide (PPSV23) vaccine. The PPSV23 vaccine dose should be obtained at least 8 weeks after the  dose of PCV13 vaccine. An adult aged 73 years or older who has certain medical conditions and previously received 1 or more doses of PPSV23 vaccine should receive 1 dose of PCV13. The PCV13 vaccine dose should be obtained 1 or more years after the last PPSV23 vaccine dose.    Pneumococcal polysaccharide (PPSV23) vaccine. When PCV13 is also indicated, PCV13 should be obtained first. All adults aged 47 years and older should be immunized. An adult younger than age 86 years who has certain medical conditions should be immunized. Any person who resides in a nursing home or long-term care facility should be immunized. An adult smoker should be immunized. People with an immunocompromised condition and certain other conditions should receive both PCV13 and PPSV23 vaccines. People with human immunodeficiency virus (HIV) infection should be immunized as soon as possible after diagnosis. Immunization  during chemotherapy or radiation therapy should be avoided. Routine use of PPSV23 vaccine is not recommended for American Indians, [place] Natives, or people younger than 65 years unless there are medical conditions that require PPSV23 vaccine. When indicated, people who have unknown immunization and have no record of immunization should receive PPSV23 vaccine. One-time revaccination 5 years after the first dose of PPSV23 is recommended for people aged 19-64 years who have chronic kidney failure, nephrotic syndrome, asplenia, or immunocompromised conditions. People who received 1-2 doses of PPSV23 before age 52 years should receive another dose of PPSV23 vaccine at age 7 years or later if at least 5 years have passed since the previous dose. Doses of PPSV23 are not needed for people immunized with PPSV23 at or after age 33 years.   Preventive Services / Frequency  Ages 24 years and over  Blood pressure check.  Lipid and cholesterol check.  Lung cancer screening. / Every year if you are aged 57-80 years and have a  30-pack-year history of smoking and currently smoke or have quit within the past 15 years. Yearly screening is stopped once you have quit smoking for at least 15 years or develop a health problem that would prevent you from having lung cancer treatment.  Clinical breast exam.** / Every year after age 108 years.  BRCA-related cancer risk assessment.** / For women who have family members with a BRCA-related cancer (breast, ovarian, tubal, or peritoneal cancers).  Mammogram.** / Every year beginning at age 95 years and continuing for as long as you are in good health. Consult with your health care provider.  Pap test.** / Every 3 years starting at age 45 years through age 52 or 15 years with 3 consecutive normal Pap tests. Testing can be stopped between 65 and 70 years with 3 consecutive normal Pap tests and no abnormal Pap or HPV tests in the past 10 years.  Fecal occult blood test (FOBT) of stool. / Every year beginning at age 89 years and continuing until age 17 years. You may not need to do this test if you get a colonoscopy every 10 years.  Flexible sigmoidoscopy or colonoscopy.** / Every 5 years for a flexible sigmoidoscopy or every 10 years for a colonoscopy beginning at age 87 years and continuing until age 60 years.  Hepatitis C blood test.** / For all people born from [birth year] through 1963 and any individual with known risks for hepatitis C.  Osteoporosis screening.** / A one-time screening for women ages 52 years and over and women at risk for fractures or osteoporosis.  Skin self-exam. / Monthly.  Influenza vaccine. / Every year.  Tetanus, diphtheria, and acellular pertussis (Tdap/Td) vaccine.** / 1 dose of Td every 10 years.  Zoster vaccine.** / 1 dose for adults aged 42 years or older.  Pneumococcal 13-valent conjugate (PCV13) vaccine.** / Consult your health care provider.  Pneumococcal polysaccharide (PPSV23) vaccine.** / 1 dose for all adults aged 37 years and older. Screening  for abdominal aortic aneurysm (AAA)  by ultrasound is recommended for people who have history of high blood pressure or who are current or former smokers.

## 2014-08-10 LAB — URINALYSIS, ROUTINE W REFLEX MICROSCOPIC
BILIRUBIN URINE: NEGATIVE
GLUCOSE, UA: NEGATIVE mg/dL
Hgb urine dipstick: NEGATIVE
KETONES UR: NEGATIVE mg/dL
LEUKOCYTES UA: NEGATIVE
NITRITE: NEGATIVE
PH: 5.5 (ref 5.0–8.0)
Protein, ur: NEGATIVE mg/dL
SPECIFIC GRAVITY, URINE: 1.015 (ref 1.005–1.030)
Urobilinogen, UA: 0.2 mg/dL (ref 0.0–1.0)

## 2014-08-10 LAB — HEMOGLOBIN A1C
HEMOGLOBIN A1C: 6.2 % — AB (ref <5.7)
Mean Plasma Glucose: 131 mg/dL — ABNORMAL HIGH (ref <117)

## 2014-08-10 LAB — MICROALBUMIN / CREATININE URINE RATIO
CREATININE, URINE: 123.5 mg/dL
MICROALB UR: 0.5 mg/dL (ref <2.0)
Microalb Creat Ratio: 4 mg/g (ref 0.0–30.0)

## 2014-08-10 LAB — VITAMIN D 25 HYDROXY (VIT D DEFICIENCY, FRACTURES): VIT D 25 HYDROXY: 55 ng/mL (ref 30–100)

## 2014-08-10 LAB — INSULIN, RANDOM: Insulin: 3.9 u[IU]/mL (ref 2.0–19.6)

## 2014-08-13 NOTE — Addendum Note (Signed)
Addended by: Jaelen Soth A on: 08/13/2014 09:19 AM   Modules accepted: Orders

## 2014-10-21 ENCOUNTER — Other Ambulatory Visit: Payer: Self-pay

## 2014-10-21 DIAGNOSIS — C50911 Malignant neoplasm of unspecified site of right female breast: Secondary | ICD-10-CM

## 2014-10-24 ENCOUNTER — Other Ambulatory Visit (HOSPITAL_BASED_OUTPATIENT_CLINIC_OR_DEPARTMENT_OTHER): Payer: Medicare Other

## 2014-10-24 DIAGNOSIS — C50911 Malignant neoplasm of unspecified site of right female breast: Secondary | ICD-10-CM

## 2014-10-24 DIAGNOSIS — C50511 Malignant neoplasm of lower-outer quadrant of right female breast: Secondary | ICD-10-CM | POA: Diagnosis not present

## 2014-10-24 LAB — COMPREHENSIVE METABOLIC PANEL (CC13)
ALBUMIN: 3.5 g/dL (ref 3.5–5.0)
ALK PHOS: 54 U/L (ref 40–150)
ALT: 16 U/L (ref 0–55)
AST: 18 U/L (ref 5–34)
Anion Gap: 4 mEq/L (ref 3–11)
BILIRUBIN TOTAL: 0.43 mg/dL (ref 0.20–1.20)
BUN: 13.1 mg/dL (ref 7.0–26.0)
CALCIUM: 9.3 mg/dL (ref 8.4–10.4)
CO2: 29 mEq/L (ref 22–29)
CREATININE: 0.7 mg/dL (ref 0.6–1.1)
Chloride: 111 mEq/L — ABNORMAL HIGH (ref 98–109)
EGFR: 81 mL/min/{1.73_m2} — AB (ref >90)
Glucose: 114 mg/dl (ref 70–140)
Potassium: 4.1 mEq/L (ref 3.5–5.1)
Sodium: 144 mEq/L (ref 136–145)
TOTAL PROTEIN: 6.1 g/dL — AB (ref 6.4–8.3)

## 2014-10-24 LAB — CBC WITH DIFFERENTIAL/PLATELET
BASO%: 0.8 % (ref 0.0–2.0)
Basophils Absolute: 0 10*3/uL (ref 0.0–0.1)
EOS%: 1.7 % (ref 0.0–7.0)
Eosinophils Absolute: 0.1 10*3/uL (ref 0.0–0.5)
HEMATOCRIT: 37 % (ref 34.8–46.6)
HGB: 12.4 g/dL (ref 11.6–15.9)
LYMPH#: 1.4 10*3/uL (ref 0.9–3.3)
LYMPH%: 26.5 % (ref 14.0–49.7)
MCH: 31.8 pg (ref 25.1–34.0)
MCHC: 33.5 g/dL (ref 31.5–36.0)
MCV: 94.9 fL (ref 79.5–101.0)
MONO#: 0.5 10*3/uL (ref 0.1–0.9)
MONO%: 10.1 % (ref 0.0–14.0)
NEUT%: 60.9 % (ref 38.4–76.8)
NEUTROS ABS: 3.1 10*3/uL (ref 1.5–6.5)
PLATELETS: 226 10*3/uL (ref 145–400)
RBC: 3.9 10*6/uL (ref 3.70–5.45)
RDW: 13.3 % (ref 11.2–14.5)
WBC: 5.1 10*3/uL (ref 3.9–10.3)

## 2014-10-31 ENCOUNTER — Ambulatory Visit: Payer: Medicare Other | Admitting: Oncology

## 2014-11-07 ENCOUNTER — Ambulatory Visit (HOSPITAL_BASED_OUTPATIENT_CLINIC_OR_DEPARTMENT_OTHER): Payer: Medicare Other | Admitting: Oncology

## 2014-11-07 VITALS — BP 112/74 | HR 72 | Temp 97.8°F | Resp 18 | Ht 63.5 in | Wt 133.5 lb

## 2014-11-07 DIAGNOSIS — C50911 Malignant neoplasm of unspecified site of right female breast: Secondary | ICD-10-CM

## 2014-11-07 DIAGNOSIS — Z853 Personal history of malignant neoplasm of breast: Secondary | ICD-10-CM | POA: Diagnosis not present

## 2014-11-07 DIAGNOSIS — Z85820 Personal history of malignant melanoma of skin: Secondary | ICD-10-CM | POA: Diagnosis not present

## 2014-11-07 NOTE — Progress Notes (Signed)
ID: Victoria Long   DOB: Nov 27, 1928  MR#: [id number]  FGH#:[id number]  PCP: Alesia Richards, MD OTHER M.D. Grayce Sessions Helfer, Griselda Miner  HISTORY OF PRESENT ILLNESS: From the original intake note:   Andreya had bilateral screening mammography April 30th, 2012 at the [facility] showing a possible mass in the right breast.  She was brought back for additional views on May 7th, and Dr. Sadie Haber was able to confirm an oval, slightly irregular mass in the outer portion of the right breast which was not palpable by her exam.  Ultrasound showed an ill-defined, hypoechoic nodule measuring 8 mm with no abnormal lymph nodes on the right.  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   With this information, the patient was referred for bilateral MRIs of the breast performed Jun 02, 2010.  This showed a solitary area of non-mass enhancement measuring 2.1 cm, some of this being postbiopsy change.  There were no suspicious lymph nodes or evidence of contralateral disease. Her case was presented at the multidisciplinary Breast Cancer conference and she proceeded to breast conserving surgery.  Her subsequent history is as detailed below.  INTERVAL HISTORY: Victoria Long returns today for followup of her estrogen receptor positive breast cancer. The interval history is generally unremarkable. She did recently go on a long driving medication and came back with a stiff neck. She also has a lipoma in her right hip that concerns her. When asked however what are worse problem is she doesn't have a worse problem.  REVIEW OF SYSTEMS: SU does have some arthritis aches and pains here in there which are not more intense or persistent than before. She maintains normal activity for her age. She does stretching exercises daily. 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  FAMILY HISTORY There is no history of breast or ovarian cancer in the family.  The patient's mother's sister had a diagnosis of lung cancer made at the age of 68.  GYNECOLOGIC HISTORY: She had menarche at age 67, underwent menopause in 73.  She had her first live birth at age 6 and had a total of 4 live births and started having mammography at age 66.  SOCIAL HISTORY: She is a retired Pharmacist, hospital.  Her son, Enid Baas present at her initial visit with his wife, Maudie Mercury.  A second child is Warden/ranger.  Both of them live in [place].  Son Milus Banister lives in [place] and son Bethena Midget lives in [place]. The patient has 10 grandchildren and 4 great-grandchildren  ADVANCED DIRECTIVES: Not in place  HEALTH MAINTENANCE: Social History  Substance Use Topics  . Smoking status: Never Smoker   . Smokeless tobacco: Never Used  . Alcohol Use: 2.0 oz/week    4 drink(s) per week     Comment: wine     Colonoscopy:  PAP:  Bone density:  Lipid panel:  No Known Allergies  Current Outpatient Prescriptions  Medication Sig Dispense Refill  . aspirin 81 MG tablet Take 81 mg by mouth daily.    Marland Kitchen atorvastatin (LIPITOR) 80 MG tablet 1/2 to 1  tablet daily or as directed for cholesterol 30 tablet 3  . calcium carbonate (OS-CAL) 600 MG TABS Take 600 mg by mouth 2 (two) times daily with a meal.     . Cholecalciferol (VITAMIN D) 1000 UNITS capsule Take 1,000 Units by mouth 2 (two) times daily.     Marland Kitchen LYSINE HCL PO Take 500 mg by mouth daily.     . Multiple Vitamins-Minerals (MULTIVITAMIN PO) Take by mouth daily.    . Omega-3 Fatty Acids (FISH OIL PO) Take by mouth daily. Mega Red     No  current facility-administered medications for this visit.    OBJECTIVE: Elderly white woman who appears stated age 81 Vitals:   11/07/14 1426  BP: 112/74  Pulse: 72  Temp: 97.8 F (36.6 C)  Resp: 18     Body mass index is 23.27 kg/(m^2).    ECOG FS:1 Filed Weights   11/07/14 1426  Weight: 133 lb 8 oz (60.555 kg)   Sclerae unicteric, pupils round and equal Oropharynx clear and moist-- no thrush or other lesions No cervical or supraclavicular adenopathy; no significant crepitus with rotation at the neck Lungs no rales or rhonchi Heart regular rate and rhythm Abd soft, nontender, positive bowel sounds MSK no focal spinal tenderness, no upper extremity lymphedema Neuro: nonfocal, well oriented, appropriate affect Breasts: The right breast is status post lumpectomy. There is no evidence of local recurrence. The right axilla is benign. The left breast is unremarkable.     LAB RESULTS: Lab Results  Component Value Date   WBC 5.1 10/24/2014   NEUTROABS 3.1 10/24/2014   HGB 12.4 10/24/2014   HCT 37.0 10/24/2014   MCV 94.9 10/24/2014   PLT 226 10/24/2014      Chemistry      Component Value Date/Time   NA 144 10/24/2014 1426   NA 142 08/09/2014 1505   K 4.1 10/24/2014 1426   K 3.9 08/09/2014 1505   CL 104 08/09/2014 1505   CL 107 06/08/2012 0851   CO2 29 10/24/2014 1426   CO2 27 08/09/2014 1505   BUN 13.1 10/24/2014 1426   BUN 16 08/09/2014 1505   CREATININE 0.7 10/24/2014 1426   CREATININE 0.58 08/09/2014 1505   CREATININE 0.65 06/11/2011 1433      Component Value Date/Time   CALCIUM 9.3 10/24/2014 1426   CALCIUM 9.3 08/09/2014 1505   ALKPHOS 54 10/24/2014 1426   ALKPHOS 54 08/09/2014 1505   AST 18 10/24/2014 1426   AST 16 08/09/2014 1505   ALT 16 10/24/2014 1426   ALT 13 08/09/2014 1505   BILITOT 0.43 10/24/2014 1426   BILITOT 0.5 08/09/2014 1505       Lab Results  Component Value Date   LABCA2 37 12/10/2011    STUDIES: CLINICAL DATA: History of  right breast cancer status post lumpectomy 2012. No current problems.  EXAM: DIGITAL DIAGNOSTIC BILATERAL MAMMOGRAM WITH 3D TOMOSYNTHESIS AND CAD  COMPARISON: With priors.  ACR Breast Density Category b: There are scattered areas of fibroglandular density.  FINDINGS: Lumpectomy changes are seen in the right breast. There is no suspicious mass or malignant type microcalcifications identified in either breast.  Mammographic images were processed with CAD.  IMPRESSION: No evidence of malignancy in either breast.  RECOMMENDATION: Bilateral diagnostic mammogram in 1 year is recommended.  I have discussed the findings and recommendations with the patient. Results were also provided in writing at the conclusion of the visit. If applicable, a reminder letter will be sent to the  patient regarding the next appointment.  BI-RADS CATEGORY 2: Benign.   Electronically Signed  By: Lillia Mountain M.D.  On: 06/08/2014 15:56  ASSESSMENT: 81 y.o.  Victoria Long woman   (1)  status post right lumpectomy June of 2012 for a T1b N0, Stage IA invasive ductal carcinoma,grade 1 with abundant extracellular mucin,  estrogen and progesterone receptor positive, Her-2 negative, with an Mib-1 of 4%. Margins were negative but close   (2) adjuvant  radiation completed August of 2012.   (3)  No adjuvant antiestrogens given.  (4) malignant melanoma removed from the right posterior arm 01/26/2013, Breslow thickness 0.56 mm, level IV, no ulceration, overall pT1b NX with no high-risk lfeatures; additional surgery for margins 02/04/2013 showed residual in situ but no residual invasive disease, with clear margins.  PLAN:  Victoria Long is more than 4 years out from her definitive surgery for her very early stage breast cancer. At this point I am comfortable releasing her back to her primary care physician.  As far as breast cancer follow-up is concerned all she will need is yearly mammography and a yearly  physician breast exam.  She understands that we will be glad to see her at any point in the future if and when the need arises, but as of now we are making no further routine appointments for her here [place]    11/07/2014

## 2015-04-17 ENCOUNTER — Other Ambulatory Visit: Payer: Self-pay | Admitting: Oncology

## 2015-04-17 DIAGNOSIS — Z853 Personal history of malignant neoplasm of breast: Secondary | ICD-10-CM

## 2015-05-09 ENCOUNTER — Other Ambulatory Visit: Payer: Self-pay | Admitting: Internal Medicine

## 2015-05-26 ENCOUNTER — Ambulatory Visit (INDEPENDENT_AMBULATORY_CARE_PROVIDER_SITE_OTHER): Payer: Medicare Other | Admitting: Internal Medicine

## 2015-05-26 ENCOUNTER — Encounter: Payer: Self-pay | Admitting: Internal Medicine

## 2015-05-26 VITALS — BP 118/62 | HR 72 | Temp 97.9°F | Resp 16 | Ht 63.0 in | Wt 135.2 lb

## 2015-05-26 DIAGNOSIS — S161XXA Strain of muscle, fascia and tendon at neck level, initial encounter: Secondary | ICD-10-CM | POA: Diagnosis not present

## 2015-05-26 MED ORDER — TIZANIDINE HCL 4 MG PO TABS: ORAL_TABLET | ORAL | Status: DC

## 2015-05-26 MED ORDER — PREDNISONE 20 MG PO TABS: ORAL_TABLET | ORAL | Status: DC

## 2015-05-26 NOTE — Patient Instructions (Signed)
Cervical Strain and Sprain With Rehab Cervical strain and sprain are injuries that commonly occur with "whiplash" injuries. Whiplash occurs when the neck is forcefully whipped backward or forward, such as during a motor vehicle accident or during contact sports. The muscles, ligaments, tendons, discs, and nerves of the neck are susceptible to injury when this occurs. RISK FACTORS Risk of having a whiplash injury increases if:  Osteoarthritis of the spine.  Situations that make head or neck accidents or trauma more likely.  High-risk sports (football, rugby, wrestling, hockey, auto racing, gymnastics, diving, contact karate, or boxing).  Poor strength and flexibility of the neck.  Previous neck injury.  Poor tackling technique.  Improperly fitted or padded equipment. SYMPTOMS   Pain or stiffness in the front or back of neck or both.  Symptoms may present immediately or up to 24 hours after injury.  Dizziness, headache, nausea, and vomiting.  Muscle spasm with soreness and stiffness in the neck.  Tenderness and swelling at the injury site. PREVENTION  Learn and use proper technique (avoid tackling with the head, spearing, and head-butting; use proper falling techniques to avoid landing on the head).  Warm up and stretch properly before activity.  Maintain physical fitness:  Strength, flexibility, and endurance.  Cardiovascular fitness.  Wear properly fitted and padded protective equipment, such as padded soft collars, for participation in contact sports. PROGNOSIS  Recovery from cervical strain and sprain injuries is dependent on the extent of the injury. These injuries are usually curable in 1 week to 3 months with appropriate treatment.  RELATED COMPLICATIONS   Temporary numbness and weakness may occur if the nerve roots are damaged, and this may persist until the nerve has completely healed.  Chronic pain due to frequent recurrence of symptoms.  Prolonged healing,  especially if activity is resumed too soon (before complete recovery). TREATMENT  Treatment initially involves the use of ice and medication to help reduce pain and inflammation. It is also important to perform strengthening and stretching exercises and modify activities that worsen symptoms so the injury does not get worse. These exercises may be performed at home or with a therapist. For patients who experience severe symptoms, a soft, padded collar may be recommended to be worn around the neck.  Improving your posture may help reduce symptoms. Posture improvement includes pulling your chin and abdomen in while sitting or standing. If you are sitting, sit in a firm chair with your buttocks against the back of the chair. While sleeping, try replacing your pillow with a small towel rolled to 2 inches in diameter, or use a cervical pillow or soft cervical collar. Poor sleeping positions delay healing.  For patients with nerve root damage, which causes numbness or weakness, the use of a cervical traction apparatus may be recommended. Surgery is rarely necessary for these injuries. However, cervical strain and sprains that are present at birth (congenital) may require surgery. MEDICATION   If pain medication is necessary, nonsteroidal anti-inflammatory medications, such as aspirin and ibuprofen, or other minor pain relievers, such as acetaminophen, are often recommended.  Do not take pain medication for 7 days before surgery.  Prescription pain relievers may be given if deemed necessary by your caregiver. Use only as directed and only as much as you need. HEAT AND COLD:   Cold treatment (icing) relieves pain and reduces inflammation. Cold treatment should be applied for 10 to 15 minutes every 2 to 3 hours for inflammation and pain and immediately after any activity that aggravates your  symptoms. Use ice packs or an ice massage.  Heat treatment may be used prior to performing the stretching and  strengthening activities prescribed by your caregiver, physical therapist, or athletic trainer. Use a heat pack or a warm soak. SEEK MEDICAL CARE IF:   Symptoms get worse or do not improve in 2 weeks despite treatment.  New, unexplained symptoms develop (drugs used in treatment may produce side effects). EXERCISES RANGE OF MOTION (ROM) AND STRETCHING EXERCISES - Cervical Strain and Sprain These exercises may help you when beginning to rehabilitate your injury. In order to successfully resolve your symptoms, you must improve your posture. These exercises are designed to help reduce the forward-head and rounded-shoulder posture which contributes to this condition. Your symptoms may resolve with or without further involvement from your physician, physical therapist or athletic trainer. While completing these exercises, remember:   Restoring tissue flexibility helps normal motion to return to the joints. This allows healthier, less painful movement and activity.  An effective stretch should be held for at least 20 seconds, although you may need to begin with shorter hold times for comfort.  A stretch should never be painful. You should only feel a gentle lengthening or release in the stretched tissue. STRETCH- Axial Extensors  Lie on your back on the floor. You may bend your knees for comfort. Place a rolled-up hand towel or dish towel, about 2 inches in diameter, under the part of your head that makes contact with the floor.  Gently tuck your chin, as if trying to make a "double chin," until you feel a gentle stretch at the base of your head.  Hold __________ seconds. Repeat __________ times. Complete this exercise __________ times per day.  STRETCH - Axial Extension   Stand or sit on a firm surface. Assume a good posture: chest up, shoulders drawn back, abdominal muscles slightly tense, knees unlocked (if standing) and feet hip width apart.  Slowly retract your chin so your head slides back  and your chin slightly lowers. Continue to look straight ahead.  You should feel a gentle stretch in the back of your head. Be certain not to feel an aggressive stretch since this can cause headaches later.  Hold for __________ seconds. Repeat __________ times. Complete this exercise __________ times per day. STRETCH - Cervical Side Bend   Stand or sit on a firm surface. Assume a good posture: chest up, shoulders drawn back, abdominal muscles slightly tense, knees unlocked (if standing) and feet hip width apart.  Without letting your nose or shoulders move, slowly tip your right / left ear to your shoulder until your feel a gentle stretch in the muscles on the opposite side of your neck.  Hold __________ seconds. Repeat __________ times. Complete this exercise __________ times per day. STRETCH - Cervical Rotators   Stand or sit on a firm surface. Assume a good posture: chest up, shoulders drawn back, abdominal muscles slightly tense, knees unlocked (if standing) and feet hip width apart.  Keeping your eyes level with the ground, slowly turn your head until you feel a gentle stretch along the back and opposite side of your neck.  Hold __________ seconds. Repeat __________ times. Complete this exercise __________ times per day. RANGE OF MOTION - Neck Circles   Stand or sit on a firm surface. Assume a good posture: chest up, shoulders drawn back, abdominal muscles slightly tense, knees unlocked (if standing) and feet hip width apart.  Gently roll your head down and around from the back  of one shoulder to the back of the other. The motion should never be forced or painful.  Repeat the motion 10-20 times, or until you feel the neck muscles relax and loosen. Repeat __________ times. Complete the exercise __________ times per day. STRENGTHENING EXERCISES - Cervical Strain and Sprain These exercises may help you when beginning to rehabilitate your injury. They may resolve your symptoms with or  without further involvement from your physician, physical therapist, or athletic trainer. While completing these exercises, remember:   Muscles can gain both the endurance and the strength needed for everyday activities through controlled exercises.  Complete these exercises as instructed by your physician, physical therapist, or athletic trainer. Progress the resistance and repetitions only as guided.  You may experience muscle soreness or fatigue, but the pain or discomfort you are trying to eliminate should never worsen during these exercises. If this pain does worsen, stop and make certain you are following the directions exactly. If the pain is still present after adjustments, discontinue the exercise until you can discuss the trouble with your clinician. STRENGTH - Cervical Flexors, Isometric  Face a wall, standing about 6 inches away. Place a small pillow, a ball about 6-8 inches in diameter, or a folded towel between your forehead and the wall.  Slightly tuck your chin and gently push your forehead into the soft object. Push only with mild to moderate intensity, building up tension gradually. Keep your jaw and forehead relaxed.  Hold 10 to 20 seconds. Keep your breathing relaxed.  Release the tension slowly. Relax your neck muscles completely before you start the next repetition. Repeat __________ times. Complete this exercise __________ times per day. STRENGTH- Cervical Lateral Flexors, Isometric   Stand about 6 inches away from a wall. Place a small pillow, a ball about 6-8 inches in diameter, or a folded towel between the side of your head and the wall.  Slightly tuck your chin and gently tilt your head into the soft object. Push only with mild to moderate intensity, building up tension gradually. Keep your jaw and forehead relaxed.  Hold 10 to 20 seconds. Keep your breathing relaxed.  Release the tension slowly. Relax your neck muscles completely before you start the next  repetition. Repeat __________ times. Complete this exercise __________ times per day. STRENGTH - Cervical Extensors, Isometric   Stand about 6 inches away from a wall. Place a small pillow, a ball about 6-8 inches in diameter, or a folded towel between the back of your head and the wall.  Slightly tuck your chin and gently tilt your head back into the soft object. Push only with mild to moderate intensity, building up tension gradually. Keep your jaw and forehead relaxed.  Hold 10 to 20 seconds. Keep your breathing relaxed.  Release the tension slowly. Relax your neck muscles completely before you start the next repetition. Repeat __________ times. Complete this exercise __________ times per day. POSTURE AND BODY MECHANICS CONSIDERATIONS - Cervical Strain and Sprain Keeping correct posture when sitting, standing or completing your activities will reduce the stress put on different body tissues, allowing injured tissues a chance to heal and limiting painful experiences. The following are general guidelines for improved posture. Your physician or physical therapist will provide you with any instructions specific to your needs. While reading these guidelines, remember:  The exercises prescribed by your provider will help you have the flexibility and strength to maintain correct postures.  The correct posture provides the optimal environment for your joints to work.  POSTURE AND BODY MECHANICS CONSIDERATIONS - Cervical Strain and Sprain  Keeping correct posture when sitting, standing or completing your activities will reduce the stress put on different body tissues, allowing injured tissues a chance to heal and limiting painful experiences. The following are general guidelines for improved posture. Your physician or physical therapist will provide you with any instructions specific to your needs. While reading these guidelines, remember:  · The exercises prescribed by your provider will help you have the flexibility and strength to maintain correct postures.  · The correct posture provides the optimal environment for your joints to work. All of your joints have less wear and tear when properly supported by a spine with good posture. This means you will experience a healthier, less painful body.  · Correct posture must be practiced with all of your activities, especially prolonged sitting and standing. Correct posture is as important when doing repetitive low-stress activities (typing) as it is when doing a single heavy-load activity (lifting).  PROLONGED STANDING WHILE SLIGHTLY LEANING FORWARD  When completing a task that requires you to lean forward while standing in one  place for a long time, place either foot up on a stationary 2- to 4-inch high object to help maintain the best posture. When both feet are on the ground, the low back tends to lose its slight inward curve. If this curve flattens (or becomes too large), then the back and your other joints will experience too much stress, fatigue more quickly, and can cause pain.   RESTING POSITIONS  Consider which positions are most painful for you when choosing a resting position. If you have pain with flexion-based activities (sitting, bending, stooping, squatting), choose a position that allows you to rest in a less flexed posture. You would want to avoid curling into a fetal position on your side. If your pain worsens with extension-based activities (prolonged standing, working overhead), avoid resting in an extended position such as sleeping on your stomach. Most people will find more comfort when they rest with their spine in a more neutral position, neither too rounded nor too arched. Lying on a non-sagging bed on your side with a pillow between your knees, or on your back with a pillow under your knees will often provide some relief. Keep in mind, being in any one position for a prolonged period of time, no matter how correct your posture, can still lead to stiffness.  WALKING  Walk with an upright posture. Your ears, shoulders, and hips should all line up.  OFFICE WORK  When working at a desk, create an environment that supports good, upright posture. Without extra support, muscles fatigue and lead to excessive strain on joints and other tissues.  CHAIR:  · A chair should be able to slide under your desk when your back makes contact with the back of the chair. This allows you to work closely.  · The chair's height should allow your eyes to be level with the upper part of your monitor and your hands to be slightly lower than your elbows.  · Body position:    Your feet should make contact with the floor. If this is not  possible, use a foot rest.    Keep your ears over your shoulders. This will reduce stress on your neck and low back.     This information is not intended to replace advice given to you by your health care provider. Make sure you discuss any questions you have with your health care provider.

## 2015-05-26 NOTE — Progress Notes (Signed)
  Subjective:    Patient ID: Victoria Long, female    DOB: [birth year], 82 y.o.   MRN: [id number]  HPI  This very nice 82 yo WWF presents with 1 + month hx/o neck stiffness w/o hx/o injury. Hasn't tried any meds, but has tried stretching exercises with limited results. Denies radicular sx's to the UE's.   Medication Sig  . aspirin 81 MG  Take 81 mg by mouth daily.  Marland Kitchen atorvastatin  80 MG  TAKE HALF TO ONE TABLET BY MOUTH DAILY OR AS DIRECTED FOR CHOLESTEROL  . OS-CAL 600 MG  Take 600 mg by mouth 2 (two) times daily with a meal.   . VITAMIN D 1000 UNITS  Take 1,000 Units by mouth 2 (two) times daily.   Marland Kitchen LYSINE HCL  Take 500 mg by mouth daily.   . Multiple Vitamins-Minerals  Take by mouth daily.  . Omega-3 FISH OIL  Take by mouth daily. Mega Red   No Known Allergies   Past Medical History  Diagnosis Date  . Hyperlipidemia   . Prediabetes   . Vitamin D deficiency   . Arthritis     false + RF, negative anticcp  . Cancer [facility])     right breast cancer  . Melanoma ([place]) 2014    right humerus   Past Surgical History  Procedure Laterality Date  . Breast lumpectomy Right 2012    with radiation no chemo  . Cataract extraction Bilateral 2009  . Cesarean section    . Melanoma excision Right 2014    humerus    Review of Systems    10 point systems review negative except as above.    Objective:   Physical Exam  BP 118/62 mmHg  Pulse 72  Temp(Src) 97.9 F (36.6 C)  Resp 16  Ht 5\' 3"  (1.6 m)  Wt 135 lb 3.2 oz (61.326 kg)  BMI 23.96 kg/m2  HEENT - Eac's patent. TM's Nl. EOM's full. PERRLA. NasoOroPharynx clear. Neck - Straightening of Cx lordosis. Decreased lateral rotation & bending 50-75% due to discomfort. Flexion/extension limited 40-50%.  (+) tender para-Cx spasm.  Nl Thyroid. Carotids 2+ & No bruits, nodes, JVD Chest - Clear equal BS w/o Rales, rhonchi, wheezes. Cor - Nl HS. RRR w/o sig MGR. PP 1(+). No edema. MS- FROM w/o deformities. Muscle power, tone and bulk Nl. Gait  Nl. Neuro - No obvious Cr N abnormalities. Sensory, motor and Cerebellar functions appear Nl w/o focal abnormalities. DTR's of UE's brisk & equal. Sensory-motor exam of UE's Nl.     Assessment & Plan:   1. Cervical strain, initial encounter  - predniSONE (DELTASONE) 20 MG tablet; 1 tab 3 x day for 3 days, then 1 tab 2 x day for 3 days, then 1 tab 1 x day for 5 days  Dispense: 20 tablet; Refill: 0 - tiZANidine (ZANAFLEX) 4 MG tablet; Take 1/2 to 1 tablet 3 x /day if needed for muscle spasm  Dispense: 30 tablet; Refill: 1 - Reviewed stretching exercises.

## 2015-06-13 ENCOUNTER — Ambulatory Visit
Admission: RE | Admit: 2015-06-13 | Discharge: 2015-06-13 | Disposition: A | Discharge status: Home / Self Care | Payer: Medicare Other | Source: Ambulatory Visit | Attending: Oncology | Admitting: Oncology

## 2015-06-13 DIAGNOSIS — Z853 Personal history of malignant neoplasm of breast: Secondary | ICD-10-CM

## 2015-08-09 ENCOUNTER — Encounter: Payer: Self-pay | Admitting: Internal Medicine

## 2015-08-15 ENCOUNTER — Encounter: Payer: Self-pay | Admitting: Internal Medicine

## 2015-08-15 ENCOUNTER — Ambulatory Visit (INDEPENDENT_AMBULATORY_CARE_PROVIDER_SITE_OTHER): Payer: Medicare Other | Admitting: Internal Medicine

## 2015-08-15 VITALS — BP 130/70 | HR 68 | Temp 98.0°F | Resp 16 | Ht 63.25 in | Wt 130.0 lb

## 2015-08-15 DIAGNOSIS — Z139 Encounter for screening, unspecified: Secondary | ICD-10-CM

## 2015-08-15 DIAGNOSIS — C50911 Malignant neoplasm of unspecified site of right female breast: Secondary | ICD-10-CM

## 2015-08-15 DIAGNOSIS — R7303 Prediabetes: Secondary | ICD-10-CM | POA: Diagnosis not present

## 2015-08-15 DIAGNOSIS — Z1329 Encounter for screening for other suspected endocrine disorder: Secondary | ICD-10-CM | POA: Diagnosis not present

## 2015-08-15 DIAGNOSIS — R6889 Other general symptoms and signs: Secondary | ICD-10-CM

## 2015-08-15 DIAGNOSIS — Z1212 Encounter for screening for malignant neoplasm of rectum: Secondary | ICD-10-CM

## 2015-08-15 DIAGNOSIS — Z0001 Encounter for general adult medical examination with abnormal findings: Secondary | ICD-10-CM

## 2015-08-15 DIAGNOSIS — E785 Hyperlipidemia, unspecified: Secondary | ICD-10-CM | POA: Diagnosis not present

## 2015-08-15 DIAGNOSIS — Z79899 Other long term (current) drug therapy: Secondary | ICD-10-CM

## 2015-08-15 DIAGNOSIS — E559 Vitamin D deficiency, unspecified: Secondary | ICD-10-CM

## 2015-08-15 DIAGNOSIS — Z1389 Encounter for screening for other disorder: Secondary | ICD-10-CM

## 2015-08-15 DIAGNOSIS — Z136 Encounter for screening for cardiovascular disorders: Secondary | ICD-10-CM

## 2015-08-15 LAB — LIPID PANEL
Cholesterol: 175 mg/dL (ref 125–200)
HDL: 73 mg/dL (ref >=46)
LDL Cholesterol: 79 mg/dL (ref <130)
Total CHOL/HDL Ratio: 2.4 Ratio (ref <=5.0)
Triglycerides: 117 mg/dL (ref <150)
VLDL: 23 mg/dL (ref <30)

## 2015-08-15 LAB — CBC WITH DIFFERENTIAL/PLATELET
Basophils Absolute: 0 cells/uL (ref 0–200)
Basophils Relative: 0 %
Eosinophils Absolute: 122 cells/uL (ref 15–500)
Eosinophils Relative: 2 %
HCT: 39.8 % (ref 35.0–45.0)
Hemoglobin: 13.3 g/dL (ref 11.7–15.5)
Lymphocytes Relative: 32 %
Lymphs Abs: 1952 cells/uL (ref 850–3900)
MCH: 31.8 pg (ref 27.0–33.0)
MCHC: 33.4 g/dL (ref 32.0–36.0)
MCV: 95.2 fL (ref 80.0–100.0)
MPV: 10.5 fL (ref 7.5–12.5)
Monocytes Absolute: 671 cells/uL (ref 200–950)
Monocytes Relative: 11 %
Neutro Abs: 3355 cells/uL (ref 1500–7800)
Neutrophils Relative %: 55 %
Platelets: 249 10*3/uL (ref 140–400)
RBC: 4.18 MIL/uL (ref 3.80–5.10)
RDW: 13.1 % (ref 11.0–15.0)
WBC: 6.1 10*3/uL (ref 3.8–10.8)

## 2015-08-15 LAB — HEPATIC FUNCTION PANEL
ALT: 16 U/L (ref 6–29)
AST: 19 U/L (ref 10–35)
Albumin: 4 g/dL (ref 3.6–5.1)
Alkaline Phosphatase: 55 U/L (ref 33–130)
Bilirubin, Direct: 0.1 mg/dL (ref <=0.2)
Indirect Bilirubin: 0.4 mg/dL (ref 0.2–1.2)
Total Bilirubin: 0.5 mg/dL (ref 0.2–1.2)
Total Protein: 6.2 g/dL (ref 6.1–8.1)

## 2015-08-15 LAB — MAGNESIUM: Magnesium: 2.1 mg/dL (ref 1.5–2.5)

## 2015-08-15 LAB — BASIC METABOLIC PANEL WITH GFR
BUN: 17 mg/dL (ref 7–25)
CO2: 25 mmol/L (ref 20–31)
Calcium: 9.6 mg/dL (ref 8.6–10.4)
Chloride: 108 mmol/L (ref 98–110)
Creat: 0.59 mg/dL — ABNORMAL LOW (ref 0.60–0.88)
GFR, Est African American: >89 mL/min (ref >=60)
GFR, Est Non African American: 84 mL/min (ref >=60)
Glucose, Bld: 105 mg/dL — ABNORMAL HIGH (ref 65–99)
Potassium: 4.3 mmol/L (ref 3.5–5.3)
Sodium: 142 mmol/L (ref 135–146)

## 2015-08-15 LAB — TSH: TSH: 1.7 m[IU]/L

## 2015-08-15 MED ORDER — TRAMADOL HCL 50 MG PO TABS: 50.0000 mg | ORAL_TABLET | Freq: Four times a day (QID) | ORAL | 0 refills | Status: DC | PRN

## 2015-08-15 NOTE — Addendum Note (Signed)
Addended by: Starlyn Skeans A on: 08/15/2015 03:23 PM   Modules accepted: Orders

## 2015-08-15 NOTE — Progress Notes (Signed)
Complete Physical  Assessment and Plan:   1. Encounter for general adult medical examination with abnormal findings -cont diet and exercise as tolerated  2. Hyperlipidemia -d/c statin due to age and no significant comorbidity - Lipid panel  3. Prediabetes -cont diet and exercise - Hemoglobin A1c - Insulin, random  4. Vitamin D deficiency -cont supplement - VITAMIN D 25 Hydroxy (Vit-D Deficiency, Fractures)  5. Encounter for long-term (current) use of medications  - CBC with Differential/Platelet - BASIC METABOLIC PANEL WITH GFR - Hepatic function panel - Magnesium  6. Malignant neoplasm of right female breast, unspecified site of breast ([place]) -graduated from the [place] [facility] after 5 years in remission  7. Screening for hematuria or proteinuria - Urinalysis, Routine w reflex microscopic (not at [facility]) - Microalbumin / creatinine urine ratio  8. Screening for cardiovascular condition  - EKG 12-Lead  9. Screening for thyroid disorder  - TSH  10. Screening for rectal cancer - POC Hemoccult Bld/Stl (3-Cd Home Screen); Future  11.  History of melanoma -followed by Derm every six months  Discussed med's effects and SE's. Screening labs and tests as requested with regular follow-up as recommended.  HPI  82 y.o. female  presents for a complete physical.  Her blood pressure has been controlled at home, today their BP is BP: 130/70.  She does workout. She denies chest pain, shortness of breath, dizziness.   She is on cholesterol medication and denies myalgias. Her cholesterol is at goal. The cholesterol last visit was:  Lab Results  Component Value Date   CHOL 188 08/09/2014   HDL 62 08/09/2014   LDLCALC 102 (H) 08/09/2014   TRIG 121 08/09/2014   CHOLHDL 3.0 08/09/2014  .  She has been working on diet and exercise for prediabetes, she is on bASA, she is not on ACE/ARB and denies foot ulcerations, hyperglycemia, hypoglycemia , increased appetite, nausea,  paresthesia of the feet, polydipsia, polyuria, visual disturbances, vomiting and weight loss. Last A1C in the office was:  Lab Results  Component Value Date   HGBA1C 6.2 (H) 08/09/2014    Patient is on Vitamin D supplement.   Lab Results  Component Value Date   VD25OH 36 08/09/2014     She is currently doing exercises on TV.  She reports that she does this 5 days per week.  She reports that she does as much activity as her arthritis can tolerate.    She has graduated from the [place] [facility] for her breast cancer.  She has been in remission for 5 years.  She is seeing her dermatologist every 6 months for skin checks due to history of melanoma.    Current Medications:  Current Outpatient Prescriptions on File Prior to Visit  Medication Sig Dispense Refill  . aspirin 81 MG tablet Take 81 mg by mouth daily.    Marland Kitchen atorvastatin (LIPITOR) 80 MG tablet TAKE HALF TO ONE TABLET BY MOUTH DAILY OR AS DIRECTED FOR CHOLESTEROL 30 tablet 1  . calcium carbonate (OS-CAL) 600 MG TABS Take 600 mg by mouth 2 (two) times daily with a meal.     . Cholecalciferol (VITAMIN D) 1000 UNITS capsule Take 1,000 Units by mouth 2 (two) times daily.     Marland Kitchen LYSINE HCL PO Take 500 mg by mouth daily.     . Multiple Vitamins-Minerals (MULTIVITAMIN PO) Take by mouth daily.    . Omega-3 Fatty Acids (FISH OIL PO) Take by mouth daily. Mega Red     No  current facility-administered medications on file prior to visit.     Health Maintenance:   Immunization History  Administered Date(s) Administered  . Influenza, High Dose Seasonal PF 11/08/2013  . Influenza-Unspecified 11/10/2012, 10/04/2014  . Pneumococcal Conjugate-13 07/27/2013  . Pneumococcal-Unspecified 11/30/2010  . Td 02/12/2009  . Zoster 02/12/2006    Tetanus: 2011 Pneumovax: 2012 Prevnar:  2015 Flu vaccine: 2016 Zostavax:  2008 MGM: 06/13/15 DEXA: 2011, refuses further Colonoscopy: 2004 Last Eye Exam:  Dr. Ellie Lunch, 2017  Patient Care Team: Unk Pinto, MD as PCP - General (Internal Medicine) [name], MD as Consulting Physician (Gastroenterology) [name], MD as Referring Physician (Orthopedic Surgery) [name], MD as Consulting Physician (Dermatology) [name], MD as Consulting Physician (Oncology) Griselda Miner, MD as Consulting Physician (Dermatology)  Allergies: No Known Allergies  Medical History:  Past Medical History:  Diagnosis Date  . Arthritis    false + RF, negative anticcp  . Cancer [facility])    right breast cancer  . Hyperlipidemia   . Melanoma ([place]) 2014   right humerus  . Prediabetes   . Vitamin D deficiency     Surgical History:  Past Surgical History:  Procedure Laterality Date  . BREAST LUMPECTOMY Right 2012   with radiation no chemo  . CATARACT EXTRACTION Bilateral 2009  . CESAREAN SECTION    . MELANOMA EXCISION Right 2014   humerus    Family History:  Family History  Problem Relation Age of Onset  . Arthritis Mother   . Rheum arthritis Mother   . Hypertension Sister   . Cancer Sister   . Diabetes Maternal Grandmother     Social History:  Social History  Substance Use Topics  . Smoking status: Never Smoker  . Smokeless tobacco: Never Used  . Alcohol use 2.0 oz/week    4 drink(s) per week     Comment: wine    Review of Systems: Review of Systems  Constitutional: Negative for chills, fever and malaise/fatigue.  HENT: Negative for congestion, ear pain and sore throat.   Eyes: Negative.   Respiratory: Negative for cough, shortness of breath and wheezing.   Cardiovascular: Negative for chest pain, palpitations and leg swelling.  Gastrointestinal: Negative for abdominal pain, blood in stool, constipation, diarrhea, heartburn and melena.  Genitourinary: Negative.   Skin: Negative.   Neurological: Negative for dizziness, sensory change, loss of consciousness and headaches.  Psychiatric/Behavioral: Negative for depression. The patient is not nervous/anxious  and does not have insomnia.     Physical Exam: Estimated body mass index is 22.85 kg/m as calculated from the following:   Height as of this encounter: 5' 3.25" (1.607 m).   Weight as of this encounter: 130 lb (59 kg). BP 130/70   Pulse 68   Temp 98 F (36.7 C) (Temporal)   Resp 16   Ht 5' 3.25" (1.607 m)   Wt 130 lb (59 kg)   BMI 22.85 kg/m   General Appearance: Well nourished well developed, in no apparent distress.  Eyes: PERRLA, EOMs, conjunctiva no swelling or erythema ENT/Mouth: Ear canals normal without obstruction, swelling, erythema, or discharge.  TMs normal bilaterally with no erythema, bulging, retraction, or loss of landmark.  Oropharynx moist and clear with no exudate, erythema, or swelling.   Neck: Supple, thyroid normal. No bruits.  No cervical adenopathy Respiratory: Respiratory effort normal, Breath sounds clear A&P without wheeze, rhonchi, rales.   Cardio: RRR without murmurs, rubs or gallops. Brisk peripheral pulses without edema.  Chest: symmetric,  with normal excursions Breasts: Symmetric, without lumps, nipple discharge, retractions.  Abdomen: Soft, nontender, no guarding, rebound, hernias, masses, or organomegaly.  Lymphatics: Non tender without lymphadenopathy.  Genitourinary:  Musculoskeletal: Full ROM all peripheral extremities,5/5 strength, and normal gait.  Skin: Warm, dry without rashes, lesions, ecchymosis. Neuro: Awake and oriented X 3, Cranial nerves intact, reflexes equal bilaterally. Normal muscle tone, no cerebellar symptoms. Sensation intact.  Psych:  normal affect, Insight and Judgment appropriate.   EKG: WNL no changes.  Over 40 minutes of exam, counseling, chart review and critical decision making was performed  Starlyn Skeans 2:24 PM [facility]

## 2015-08-16 LAB — VITAMIN D 25 HYDROXY (VIT D DEFICIENCY, FRACTURES): Vit D, 25-Hydroxy: 54 ng/mL (ref 30–100)

## 2015-08-16 LAB — URINALYSIS, ROUTINE W REFLEX MICROSCOPIC
Bilirubin Urine: NEGATIVE
Glucose, UA: NEGATIVE
Hgb urine dipstick: NEGATIVE
KETONES UR: NEGATIVE
Leukocytes, UA: NEGATIVE
NITRITE: NEGATIVE
PROTEIN: NEGATIVE
SPECIFIC GRAVITY, URINE: 1.019 (ref 1.001–1.035)
pH: 5.5 (ref 5.0–8.0)

## 2015-08-16 LAB — MICROALBUMIN / CREATININE URINE RATIO
CREATININE, URINE: 139 mg/dL (ref 20–320)
MICROALB UR: 1 mg/dL
Microalb Creat Ratio: 7 mcg/mg creat (ref <30)

## 2015-08-16 LAB — HEMOGLOBIN A1C
Hgb A1c MFr Bld: 5.9 % — ABNORMAL HIGH (ref <5.7)
Mean Plasma Glucose: 123 mg/dL

## 2015-08-16 LAB — INSULIN, RANDOM: INSULIN: 4.7 u[IU]/mL (ref 2.0–19.6)

## 2015-11-13 ENCOUNTER — Ambulatory Visit (INDEPENDENT_AMBULATORY_CARE_PROVIDER_SITE_OTHER): Payer: Medicare Other | Admitting: *Deleted

## 2015-11-13 DIAGNOSIS — Z23 Encounter for immunization: Secondary | ICD-10-CM

## 2015-11-22 ENCOUNTER — Other Ambulatory Visit: Payer: Self-pay | Admitting: Internal Medicine

## 2016-05-03 ENCOUNTER — Other Ambulatory Visit: Payer: Self-pay | Admitting: Internal Medicine

## 2016-05-03 DIAGNOSIS — Z853 Personal history of malignant neoplasm of breast: Secondary | ICD-10-CM

## 2016-06-12 ENCOUNTER — Ambulatory Visit (INDEPENDENT_AMBULATORY_CARE_PROVIDER_SITE_OTHER): Payer: Medicare Other | Admitting: Internal Medicine

## 2016-06-12 ENCOUNTER — Encounter: Payer: Self-pay | Admitting: Internal Medicine

## 2016-06-12 VITALS — BP 120/64 | HR 76 | Temp 97.8°F | Resp 16 | Ht 63.0 in | Wt 131.2 lb

## 2016-06-12 DIAGNOSIS — M26609 Unspecified temporomandibular joint disorder, unspecified side: Secondary | ICD-10-CM

## 2016-06-12 MED ORDER — PREDNISONE 20 MG PO TABS: ORAL_TABLET | ORAL | 0 refills | Status: DC

## 2016-06-12 NOTE — Patient Instructions (Signed)
Temporomandibular Joint Syndrome Temporomandibular joint (TMJ) syndrome is a condition that affects the joints between your jaw and your skull. The TMJs are located near your ears and allow your jaw to open and close. These joints and the nearby muscles are involved in all movements of the jaw. People with TMJ syndrome have pain in the area of these joints and muscles. Chewing, biting, or other movements of the jaw can be difficult or painful. TMJ syndrome can be caused by various things. In many cases, the condition is mild and goes away within a few weeks. For some people, the condition can become a long-term problem. What are the causes? Possible causes of TMJ syndrome include:  Grinding your teeth or clenching your jaw. Some people do this when they are under stress.  Arthritis.  Injury to the jaw.  Head or neck injury.  Teeth or dentures that are not aligned well.  In some cases, the cause of TMJ syndrome may not be known. What are the signs or symptoms? The most common symptom is an aching pain on the side of the head in the area of the TMJ. Other symptoms may include:  Pain when moving your jaw, such as when chewing or biting.  Being unable to open your jaw all the way.  Making a clicking sound when you open your mouth.  Headache.  Earache.  Neck or shoulder pain.  How is this diagnosed? Diagnosis can usually be made based on your symptoms, your medical history, and a physical exam. Your health care provider may check the range of motion of your jaw. Imaging tests, such as X-rays or an MRI, are sometimes done. You may need to see your dentist to determine if your teeth and jaw are lined up correctly. How is this treated? TMJ syndrome often goes away on its own. If treatment is needed, the options may include:  Eating soft foods and applying ice or heat.  Medicines to relieve pain or inflammation.  Medicines to relax the muscles.  A splint, bite plate, or mouthpiece  to prevent teeth grinding or jaw clenching.  Relaxation techniques or counseling to help reduce stress.  Transcutaneous electrical nerve stimulation (TENS). This helps to relieve pain by applying an electrical current through the skin.  Acupuncture. This is sometimes helpful to relieve pain.  Jaw surgery. This is rarely needed.  Follow these instructions at home:  Take medicines only as directed by your health care provider.  Eat a soft diet if you are having trouble chewing.  Apply ice to the painful area. ? Put ice in a plastic bag. ? Place a towel between your skin and the bag. ? Leave the ice on for 20 minutes, 2-3 times a day.  Apply a warm compress to the painful area as directed.  Massage your jaw area and perform any jaw stretching exercises as recommended by your health care provider.  If you were given a mouthpiece or bite plate, wear it as directed.  Avoid foods that require a lot of chewing. Do not chew gum.  Keep all follow-up visits as directed by your health care provider. This is important. Contact a health care provider if:  You are having trouble eating.  You have new or worsening symptoms. Get help right away if:  Your jaw locks open or closed.   

## 2016-06-12 NOTE — Progress Notes (Signed)
  Subjective:    Patient ID: Victoria Long, female    DOB: 1928-01-27, 83 y.o.   MRN: [id number]  HPI   This very nice 83 yo WWF presents with c/o fleeting sharp stabbing pains of the Rt preauricular area and occas tenderness of the Rt vertex of the head.   Medication Sig  . aspirin 81 MG tablet Take 81 mg by mouth daily.  . OS-CAL 600 MG TABS Take 600 mg by mouth 2 (two) times daily with a meal.   . VITAMIN D 1000 UNITS  Take 1,000 Units by mouth 2 (two) times daily.   Marland Kitchen LYSINE HCL Take 500 mg by mouth daily.   . Multiple Vitamins-Minerals  Take by mouth daily.  . Omega-3 FISH OIL Take by mouth daily. Mega Red  . traMADol  50 MG tablet Take 1 tablet (50 mg total) by mouth every 6 (six) hours as needed.   Allergies  Allergen Reactions  . Tramadol     Medication caused dizziness   Past Medical History:  Diagnosis Date  . Arthritis    false + RF, negative anticcp  . Cancer [facility])    right breast cancer  . Hyperlipidemia   . Melanoma ([place]) 2014   right humerus  . Prediabetes   . Vitamin D deficiency    Review of Systems  10 point systems review negative except as above.      Objective:   Physical Exam  BP 120/64   Pulse 76   Temp 97.8 F (36.6 C)   Resp 16   Ht 5\' 3"  (1.6 m)   Wt 131 lb 3.2 oz (59.5 kg)   BMI 23.24 kg/m   Skin - clear, esp about the head/scalp.  HEENT - Eac's patent. TM's Nl. EOM's full. (+) exquisite tenderness of the Rt>>Lt TM jts. PERRLA. NasoOroPharynx clear. Neck - supple. Nl Thyroid. Carotids 2+ & No bruits, nodes, JVD Chest - Clear equal BS w/o Rales, rhonchi, wheezes. Cor - Nl HS. RRR w/o sig m. MS- FROM w/o deformities. Muscle power, tone and bulk Nl. Gait Nl. Neuro - No obvious Cr N abnormalities. Sensory, motor and Cerebellar functions appear Nl w/o focal abnormalities.    Assessment & Plan:   1. TMJ (temporomandibular joint syndrome)  - predniSONE (DELTASONE) 20 MG tablet; 1 tab 3 x day for 3 days, then 1 tab 2 x day for 3 days, then  1 tab 1 x day for 5 days  Dispense: 20 tablet; Refill: 0  - discussed  DDx/sx's and meds/SE's.  - discussed prudent diet and exercise

## 2016-06-14 ENCOUNTER — Ambulatory Visit
Admission: RE | Admit: 2016-06-14 | Discharge: 2016-06-14 | Disposition: A | Discharge status: Home / Self Care | Payer: Medicare Other | Source: Ambulatory Visit | Attending: Internal Medicine | Admitting: Internal Medicine

## 2016-06-14 DIAGNOSIS — Z853 Personal history of malignant neoplasm of breast: Secondary | ICD-10-CM

## 2016-06-14 HISTORY — DX: Malignant neoplasm of unspecified site of unspecified female breast: C50.919

## 2016-06-14 HISTORY — DX: Personal history of irradiation: Z92.3

## 2016-07-25 ENCOUNTER — Ambulatory Visit (INDEPENDENT_AMBULATORY_CARE_PROVIDER_SITE_OTHER): Payer: Medicare Other | Admitting: Physician Assistant

## 2016-07-25 ENCOUNTER — Encounter: Payer: Self-pay | Admitting: Physician Assistant

## 2016-07-25 VITALS — BP 126/80 | HR 64 | Temp 97.3°F | Resp 14 | Ht 63.0 in | Wt 132.0 lb

## 2016-07-25 DIAGNOSIS — R5383 Other fatigue: Secondary | ICD-10-CM

## 2016-07-25 LAB — CBC WITH DIFFERENTIAL/PLATELET
BASOS ABS: 60 {cells}/uL (ref 0–200)
Basophils Relative: 1 %
EOS PCT: 1 %
Eosinophils Absolute: 60 cells/uL (ref 15–500)
HCT: 39.1 % (ref 35.0–45.0)
Hemoglobin: 12.8 g/dL (ref 11.7–15.5)
LYMPHS PCT: 29 %
Lymphs Abs: 1740 cells/uL (ref 850–3900)
MCH: 31.4 pg (ref 27.0–33.0)
MCHC: 32.7 g/dL (ref 32.0–36.0)
MCV: 96.1 fL (ref 80.0–100.0)
MONOS PCT: 12 %
MPV: 10.8 fL (ref 7.5–12.5)
Monocytes Absolute: 720 cells/uL (ref 200–950)
NEUTROS ABS: 3420 {cells}/uL (ref 1500–7800)
NEUTROS PCT: 57 %
PLATELETS: 273 10*3/uL (ref 140–400)
RBC: 4.07 MIL/uL (ref 3.80–5.10)
RDW: 13.4 % (ref 11.0–15.0)
WBC: 6 10*3/uL (ref 3.8–10.8)

## 2016-07-25 NOTE — Patient Instructions (Addendum)
Palpitations A palpitation is the feeling that your heartbeat is irregular or is faster than normal. It may feel like your heart is fluttering or skipping a beat. Palpitations are usually not a serious problem. They may be caused by many things, including smoking, caffeine, alcohol, stress, and certain medicines. Although most causes of palpitations are not serious, palpitations can be a sign of a serious medical problem. In some cases, you may need further medical evaluation. Follow these instructions at home: Pay attention to any changes in your symptoms. Take these actions to help with your condition:  Avoid the following: ? Caffeinated coffee, tea, soft drinks, diet pills, and energy drinks. ? Chocolate. ? Alcohol.  Do not use any tobacco products, such as cigarettes, chewing tobacco, and e-cigarettes. If you need help quitting, ask your health care provider.  Try to reduce your stress and anxiety. Things that can help you relax include: ? Yoga. ? Meditation. ? Physical activity, such as swimming, jogging, or walking. ? Biofeedback. This is a method that helps you learn to use your mind to control things in your body, such as your heartbeats.  Get plenty of rest and sleep.  Take over-the-counter and prescription medicines only as told by your health care provider.  Keep all follow-up visits as told by your health care provider. This is important.  Contact a health care provider if:  You continue to have a fast or irregular heartbeat after 24 hours.  Your palpitations occur more often. Get help right away if:  You have chest pain or shortness of breath.  You have a severe headache.  You feel dizzy or you faint. This information is not intended to replace advice given to you by your health care provider. Make sure you discuss any questions you have with your health care provider. Document Released: 01/05/2000 Document Revised: 06/12/2015 Document Reviewed: 09/22/2014 Elsevier  Interactive Patient Education  2017 [employer].  What is the TMJ? The temporomandibular (tem-PUH-ro-man-DIB-yoo-ler) joint, or the TMJ, connects the upper and lower jawbones. This joint allows the jaw to open wide and move back and forth when you chew, talk, or yawn.There are also several muscles that help this joint move. There can be muscle tightness and pain in the muscle that can cause several symptoms.  What causes TMJ pain? There are many causes of TMJ pain. Repeated chewing (for example, chewing gum) and clenching your teeth can cause pain in the joint. Some TMJ pain has no obvious cause. What can I do to ease the pain? There are many things you can do to help your pain get better. When you have pain:  Eat soft foods and stay away from chewy foods (for example, taffy) Try to use both sides of your mouth to chew Don't chew gum Massage Don't open your mouth wide (for example, during yawning or singing) Don't bite your cheeks or fingernails Lower your amount of stress and worry Applying a warm, damp washcloth to the joint may help. Over-the-counter pain medicines such as ibuprofen (one brand: Advil) or acetaminophen (one brand: Tylenol) might also help. Do not use these medicines if you are allergic to them or if your doctor told you not to use them. How can I stop the pain from coming back? When your pain is better, you can do these exercises to make your muscles stronger and to keep the pain from coming back:  Resisted mouth opening: Place your thumb or two fingers under your chin and open your mouth slowly, pushing  up lightly on your chin with your thumb. Hold for three to six seconds. Close your mouth slowly. Resisted mouth closing: Place your thumbs under your chin and your two index fingers on the ridge between your mouth and the bottom of your chin. Push down lightly on your chin as you close your mouth. Tongue up: Slowly open and close your mouth while keeping the tongue touching  the roof of the mouth. Side-to-side jaw movement: Place an object about one fourth of an inch thick (for example, two tongue depressors) between your front teeth. Slowly move your jaw from side to side. Increase the thickness of the object as the exercise becomes easier Forward jaw movement: Place an object about one fourth of an inch thick between your front teeth and move the bottom jaw forward so that the bottom teeth are in front of the top teeth. Increase the thickness of the object as the exercise becomes easier. These exercises should not be painful. If it hurts to do these exercises, stop doing them and talk to your family doctor.   You can take tylenol (500mg ) or tylenol arthritis (650mg ) with the meloxicam/antiinflammatories. The max you can take of tylenol a day is 3000mg  daily, this is a max of 6 pills a day of the regular tyelnol (500mg ) or a max of 4 a day of the tylenol arthritis (650mg ) as long as no other medications you are taking contain tylenol.

## 2016-07-25 NOTE — Progress Notes (Signed)
Subjective:    Patient ID: Victoria Long, female    DOB: 24-Mar-1928, 83 y.o.   MRN: [id number]  HPI 83 y.o. WF with history of chol, preDM, and right breast cancer presents with fatigue and feeling anxious.  She has been taking tylenol arthritis for stiff neck, taking 2 pills 3 x a day x 3 days and neck felt better but she started to not feel well. This AM at 6 she felt edgy, nervous after taking the tylenol. She took a nap this afternoon, BP has been good, and she is feeling better. Recently did yard work/hedging and had no CP, SOB. No fever, chills.   Blood pressure 126/80, pulse 64, temperature (!) 97.3 F (36.3 C), resp. rate 14, height 5\' 3"  (1.6 m), weight 132 lb (59.9 kg), SpO2 98 %.  Medications Current Outpatient Prescriptions on File Prior to Visit  Medication Sig  . aspirin 81 MG tablet Take 81 mg by mouth daily.  . calcium carbonate (OS-CAL) 600 MG TABS Take 600 mg by mouth 2 (two) times daily with a meal.   . Cholecalciferol (VITAMIN D) 1000 UNITS capsule Take 1,000 Units by mouth 2 (two) times daily.   Marland Kitchen LYSINE HCL PO Take 500 mg by mouth daily.   . Multiple Vitamins-Minerals (MULTIVITAMIN PO) Take by mouth daily.  . Omega-3 Fatty Acids (FISH OIL PO) Take by mouth daily. Mega Red   No current facility-administered medications on file prior to visit.     Problem list She has Hyperlipidemia; Prediabetes; Vitamin D deficiency; Encounter for long-term (current) use of medications; and Breast cancer, right breast ([place]) on her problem list.   Review of Systems  Constitutional: Positive for fatigue. Negative for appetite change, chills, diaphoresis and fever.  HENT: Negative.   Respiratory: Negative.  Negative for shortness of breath.   Cardiovascular: Positive for palpitations. Negative for chest pain and leg swelling.  Gastrointestinal: Negative.  Negative for abdominal pain, constipation, diarrhea and nausea.  Genitourinary: Negative.  Negative for dysuria and frequency.   Musculoskeletal: Positive for back pain, myalgias and neck pain.  Skin: Negative.  Negative for rash.  Neurological: Positive for light-headedness. Negative for dizziness, tremors, seizures, syncope, facial asymmetry, speech difficulty, weakness, numbness and headaches.  Hematological: Negative.   Psychiatric/Behavioral: Positive for agitation. Negative for behavioral problems, confusion, decreased concentration, dysphoric mood, hallucinations, self-injury, sleep disturbance and suicidal ideas. The patient is nervous/anxious. The patient is not hyperactive.        Objective:   Physical Exam  Constitutional: She is oriented to person, place, and time. She appears well-developed and well-nourished.  HENT:  Head: Normocephalic and atraumatic.  Right Ear: External ear normal.  Left Ear: External ear normal.  Mouth/Throat: Oropharynx is clear and moist.  Eyes: Conjunctivae and EOM are normal. Pupils are equal, round, and reactive to light.  Neck: Normal range of motion. Neck supple. No thyromegaly present.  Cardiovascular: Normal rate, regular rhythm and normal heart sounds.  Exam reveals no gallop and no friction rub.   No murmur heard. Pulmonary/Chest: Effort normal and breath sounds normal. No respiratory distress. She has no wheezes.  Abdominal: Soft. Bowel sounds are normal. She exhibits no distension and no mass. There is no tenderness. There is no rebound and no guarding.  Musculoskeletal: Normal range of motion.  Lymphadenopathy:    She has no cervical adenopathy.  Neurological: She is alert and oriented to person, place, and time. She displays normal reflexes. No cranial nerve deficit. Coordination normal.  Skin:  Skin is warm and dry.  Psychiatric: She has a normal mood and affect.       Assessment & Plan:  Fatigue, unspecified type No recent tick exposure, no CP, no SOB, felt panicky Will check labs Avoid tylenol for now   - CBC with Differential/Platelet - BASIC METABOLIC  PANEL WITH GFR - Hepatic function panel - TSH - Sedimentation rate - Acetaminophen level  The patient was advised to call immediately if she has any concerning symptoms in the interval. The patient voices understanding of current treatment options and is in agreement with the current care plan.The patient knows to call the clinic with any problems, questions or concerns or go to the ER if any further progression of symptoms.    Future Appointments Date Time Provider [place]  08/22/2016 9:00 AM Vicie Mutters, PA-C [id number] None

## 2016-07-26 LAB — HEPATIC FUNCTION PANEL
ALBUMIN: 4 g/dL (ref 3.6–5.1)
ALT: 16 U/L (ref 6–29)
AST: 20 U/L (ref 10–35)
Alkaline Phosphatase: 56 U/L (ref 33–130)
BILIRUBIN DIRECT: 0.1 mg/dL (ref <=0.2)
BILIRUBIN TOTAL: 0.4 mg/dL (ref 0.2–1.2)
Indirect Bilirubin: 0.3 mg/dL (ref 0.2–1.2)
Total Protein: 6.2 g/dL (ref 6.1–8.1)

## 2016-07-26 LAB — BASIC METABOLIC PANEL WITH GFR
BUN: 17 mg/dL (ref 7–25)
CALCIUM: 9.4 mg/dL (ref 8.6–10.4)
CO2: 25 mmol/L (ref 20–31)
CREATININE: 0.6 mg/dL (ref 0.60–0.88)
Chloride: 107 mmol/L (ref 98–110)
GFR, Est African American: >89 mL/min (ref >=60)
GFR, Est Non African American: 83 mL/min (ref >=60)
Glucose, Bld: 96 mg/dL (ref 65–99)
Potassium: 4.2 mmol/L (ref 3.5–5.3)
SODIUM: 142 mmol/L (ref 135–146)

## 2016-07-26 LAB — SEDIMENTATION RATE: SED RATE: 1 mm/h (ref 0–30)

## 2016-07-26 LAB — TSH: TSH: 2.87 m[IU]/L

## 2016-07-26 NOTE — Progress Notes (Signed)
Pt aware of lab results & voiced understanding of those results.

## 2016-07-31 LAB — ACETAMINOPHEN LEVEL: Acetaminophen (Tylenol), Serum: <10 mg/L — ABNORMAL LOW (ref 10–20)

## 2016-08-14 ENCOUNTER — Encounter: Payer: Self-pay | Admitting: Internal Medicine

## 2016-08-16 ENCOUNTER — Encounter: Payer: Self-pay | Admitting: Internal Medicine

## 2016-08-19 ENCOUNTER — Ambulatory Visit (INDEPENDENT_AMBULATORY_CARE_PROVIDER_SITE_OTHER): Payer: Medicare Other | Admitting: Internal Medicine

## 2016-08-19 ENCOUNTER — Encounter: Payer: Self-pay | Admitting: Internal Medicine

## 2016-08-19 VITALS — BP 126/72 | HR 85 | Temp 97.2°F | Resp 16 | Ht 63.0 in | Wt 131.2 lb

## 2016-08-19 DIAGNOSIS — R0989 Other specified symptoms and signs involving the circulatory and respiratory systems: Secondary | ICD-10-CM | POA: Diagnosis not present

## 2016-08-19 DIAGNOSIS — K862 Cyst of pancreas: Secondary | ICD-10-CM

## 2016-08-19 DIAGNOSIS — S32009D Unspecified fracture of unspecified lumbar vertebra, subsequent encounter for fracture with routine healing: Secondary | ICD-10-CM

## 2016-08-19 DIAGNOSIS — Z79899 Other long term (current) drug therapy: Secondary | ICD-10-CM

## 2016-08-19 DIAGNOSIS — W1800XA Striking against unspecified object with subsequent fall, initial encounter: Secondary | ICD-10-CM

## 2016-08-19 LAB — CBC WITH DIFFERENTIAL/PLATELET
BASOS PCT: 0 %
Basophils Absolute: 0 cells/uL (ref 0–200)
Eosinophils Absolute: 134 cells/uL (ref 15–500)
Eosinophils Relative: 2 %
HEMATOCRIT: 40.9 % (ref 35.0–45.0)
HEMOGLOBIN: 13.6 g/dL (ref 11.7–15.5)
LYMPHS ABS: 1541 {cells}/uL (ref 850–3900)
Lymphocytes Relative: 23 %
MCH: 32.2 pg (ref 27.0–33.0)
MCHC: 33.3 g/dL (ref 32.0–36.0)
MCV: 96.9 fL (ref 80.0–100.0)
MONO ABS: 536 {cells}/uL (ref 200–950)
MPV: 10.4 fL (ref 7.5–12.5)
Monocytes Relative: 8 %
Neutro Abs: 4489 cells/uL (ref 1500–7800)
Neutrophils Relative %: 67 %
Platelets: 300 10*3/uL (ref 140–400)
RBC: 4.22 MIL/uL (ref 3.80–5.10)
RDW: 13.2 % (ref 11.0–15.0)
WBC: 6.7 10*3/uL (ref 3.8–10.8)

## 2016-08-19 LAB — BASIC METABOLIC PANEL WITH GFR
BUN: 17 mg/dL (ref 7–25)
CALCIUM: 9.5 mg/dL (ref 8.6–10.4)
CHLORIDE: 103 mmol/L (ref 98–110)
CO2: 28 mmol/L (ref 20–31)
Creat: 0.49 mg/dL — ABNORMAL LOW (ref 0.60–0.88)
GFR, EST NON AFRICAN AMERICAN: 89 mL/min (ref >=60)
GFR, Est African American: >89 mL/min (ref >=60)
Glucose, Bld: 93 mg/dL (ref 65–99)
POTASSIUM: 4.1 mmol/L (ref 3.5–5.3)
SODIUM: 141 mmol/L (ref 135–146)

## 2016-08-19 MED ORDER — HYDROCODONE-ACETAMINOPHEN 5-325 MG PO TABS: ORAL_TABLET | ORAL | 0 refills | Status: AC

## 2016-08-19 NOTE — Progress Notes (Signed)
[facility]   Unk Pinto, M.D.    Uvaldo Bristle. Silverio Lay, P.A.-C      Starlyn Skeans, P.A.-C  [facility]                [address] [place], [place] [address] Telephone [phone] Telefax [phone] Subjective:    Patient ID: Victoria Long, female    DOB: 1928/10/24, 83 y.o.   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  No labs or postural BP's were done. She was treated with Vicodin and has persisting pain. She is brought in today by her son & Daug-in-law.  Has some constipation from pain meds. Patient had negative colonoscopy remotely in 2004 by Dr Carlean Purl.  Medication Sig  . aspirin 81 MG tablet Take 81 mg by mouth daily.  . OS-CAL 600 MG TABS Take 600 mg by mouth 2 (two) times daily with a meal.   . VITAMIN D 1000 UNITS capsule Take 1,000 Units by mouth 2 (two) times daily.   Marland Kitchen LYSINE HCL PO Take 500 mg by mouth daily.   . Multi-Vit w/ Minerals Take by mouth daily.  . Omega-3FISH OIL Take by mouth daily. Mega Red   Allergies  Allergen Reactions  . Tramadol     Medication caused dizziness   Past Medical History:  Diagnosis Date  . Arthritis    false + RF, negative anticcp  . Breast cancer ([place])   . Cancer [facility])    right breast cancer  . Hyperlipidemia   . Melanoma ([place]) 2014   right humerus  . Personal history of radiation therapy 2012  . Prediabetes   . Vitamin D deficiency     Review of Systems  10 point systems review negative except as above.    Objective:   Physical Exam  BP 126/72   Pulse 85   Temp (!) 97.2 F (36.2 C)   Resp 16   Ht 5\' 3"  (1.6 m)   Wt 131 lb 3.2 oz (59.5 kg)   BMI 23.24 kg/m    Postural BP's sitting 152/83 P 74       & standing  147/78 P 78  Appears uncomfortable, but in no acute distress.  HEENT - WNL. Neck - supple.  Chest - Clear equal BS. Abd - soft, non-tender &  Benign. Cor - Nl HS. RRR w/o sig MGR. PP 1(+). No edema. MS- FROM w/o deformities. Tender upper para-lumbar area. Gait Nl. Neuro -  Nl w/o focal abnormalities.    Assessment & Plan:   1. Fall against object   2. Closed fracture of spinous process of lumbar vertebra with routine healing, subsequent encounter  - HYDROcodone-acetaminophen (NORCO) 5-325 MG tablet; Take 1/2 to 1 tablet 3 to 4 x / day only if needed for severe pain  Dispense: 30 tablet; Refill: 0  3. Labile hypertension   4. Mucinous Cystic Neoplasm of pancreas    (Patient has CD of scan)  - Ambulatory referral to Gastroenterology  5. Medication management  - CBC with Differential/Platelet - BASIC METABOLIC PANEL WITH GFR   Has f/u visit on  Aug 2sd.

## 2016-08-20 ENCOUNTER — Telehealth: Payer: Self-pay | Admitting: Internal Medicine

## 2016-08-20 ENCOUNTER — Encounter: Payer: Self-pay | Admitting: Internal Medicine

## 2016-08-20 NOTE — Progress Notes (Signed)
MEDICARE WELLNESS VISIT AND FOLLOW UP  Assessment and Plan:  Vitamin D deficiency Continue supplement  Prediabetes Discussed general issues about diabetes pathophysiology and management., Educational material distributed., Suggested low cholesterol diet., Encouraged aerobic exercise., Discussed foot care., Reminded to get yearly retinal exam. -     Hemoglobin A1c  Hyperlipidemia, unspecified hyperlipidemia type -continue medications, check lipids, decrease fatty foods, increase activity.  -     Lipid panel  Encounter for long-term (current) use of medications  History of right breast cancer monitor  Pancreatic mass Due to age and discussed advanced care planning, patient just wants observation, does not want further test or any treatment  Lumbar verterbral fracture, traumatic (HCC) -     predniSONE (DELTASONE) 20 MG tablet; 1 pill 3 x a day for 3 days, 1 pill 2 x a day x 3 days, 1 pill a day x 5 days with food -     Ambulatory referral to Orthopedics  Advanced care planning/counseling discussion Patient will fill out advanced care directives with sons (has 4) Filled out DNR form today and given to patient  Encounter for Medicare annual wellness exam Due next year  Atherosclerosis of aorta ([place]) Control blood pressure, cholesterol, glucose, increase exercise.   History of melanoma Continue to monitor  Screening for blood or protein in urine -     Microalbumin / creatinine urine ratio -     Urinalysis, Routine w reflex microscopic   Discussed med's effects and SE's. Screening labs and tests as requested with regular follow-up as recommended.  Future Appointments Date Time Provider [place]  09/02/2017 10:00 AM Vicie Mutters, PA-C [facility] None     During the course of the visit the patient was educated and counseled about appropriate screening and preventive services including:    Pneumococcal vaccine   Influenza vaccine  Td vaccine  Screening  electrocardiogram  Bone densitometry screening  Colorectal cancer screening  Diabetes screening  Glaucoma screening  Nutrition counseling   Advanced directives: requested   HPI  83 y.o. female  presents for a medicare visit and 3 month follow up.   Her blood pressure has been controlled at home, today their BP is BP: 120/72.  She does workout. She denies chest pain, shortness of breath, dizziness.   She had recent fall backwards while at the beach with her family, had lumbar fracture 9 days ago but also had pancreatic cyst, getting evaluation with GI/MRI, Dr. Carlean Purl, however she is opting for observation. Still having back pain, on hydrocodone, she is taking 1 pill every 6 hours, when she went down to 1/2 pill she had too much pain. She does have constipation. She has no weight loss, good appetite.   She is on cholesterol medication and denies myalgias. Her cholesterol is at goal. The cholesterol last visit was:  Lab Results  Component Value Date   CHOL 175 08/15/2015   HDL 73 08/15/2015   LDLCALC 79 08/15/2015   TRIG 117 08/15/2015   CHOLHDL 2.4 08/15/2015  . She has been working on diet and exercise for prediabetes, she is on bASA, she is not on ACE/ARB and denies foot ulcerations, hyperglycemia, hypoglycemia , increased appetite, nausea, paresthesia of the feet, polydipsia, polyuria, visual disturbances, vomiting and weight loss. Last A1C in the office was:  Lab Results  Component Value Date   HGBA1C 5.9 (H) 08/15/2015   Patient is on Vitamin D supplement.   Lab Results  Component Value Date   VD25OH 54 08/15/2015  She is seeing her dermatologist every 6 months for skin checks due to history of melanoma.   BMI is Body mass index is 23.76 kg/m., she is working on diet and exercise. Wt Readings from Last 3 Encounters:  08/22/16 132 lb (59.9 kg)  08/21/16 133 lb 2 oz (60.4 kg)  08/19/16 131 lb 3.2 oz (59.5 kg)   She has graduated from [facility] for  history of breast cancer.    Current Medications:  Current Outpatient Prescriptions on File Prior to Visit  Medication Sig Dispense Refill  . aspirin 81 MG tablet Take 81 mg by mouth daily.    . calcium carbonate (OS-CAL) 600 MG TABS Take 600 mg by mouth 2 (two) times daily with a meal.     . Cholecalciferol (VITAMIN D) 1000 UNITS capsule Take 1,000 Units by mouth 2 (two) times daily.     Marland Kitchen HYDROcodone-acetaminophen (NORCO) 5-325 MG tablet Take 1/2 to 1 tablet 3 to 4 x / day only if needed for severe pain 30 tablet 0  . LYSINE HCL PO Take 500 mg by mouth daily.     . Multiple Vitamins-Minerals (MULTIVITAMIN PO) Take by mouth daily.    . Omega-3 Fatty Acids (FISH OIL PO) Take by mouth daily. Mega Red     No current facility-administered medications on file prior to visit.     Health Maintenance:   Immunization History  Administered Date(s) Administered  . Influenza, High Dose Seasonal PF 11/08/2013, 11/13/2015  . Influenza-Unspecified 11/10/2012, 10/04/2014  . Pneumococcal Conjugate-13 07/27/2013  . Pneumococcal-Unspecified 11/30/2010  . Td 02/12/2009  . Zoster 02/12/2006    Tetanus: 2011 Pneumovax: 2012 Prevnar:  2015 Flu vaccine: 2017 Zostavax:  2008  MGM: 06/14/16 DEXA: 2011, refuses further Colonoscopy: 2004 declines further due to age Last Eye Exam:  Dr. Ellie Lunch, 2017 PAP 2014 declines further due to age  Patient Care Team: Unk Pinto, MD as PCP - General (Internal Medicine) [name], MD as Consulting Physician (Gastroenterology) [name], MD as Referring Physician (Orthopedic Surgery) [name], MD as Consulting Physician (Dermatology) Magrinat, [name], MD as Consulting Physician (Oncology) Griselda Miner, MD as Consulting Physician (Dermatology)  Medical History:  Past Medical History:  Diagnosis Date  . Arthritis    false + RF, negative anticcp  . Cancer [facility])    right breast cancer  . Hyperlipidemia   . Melanoma ([place]) 2014    right humerus  . Personal history of radiation therapy 2012  . Prediabetes   . Vitamin D deficiency    Allergies Allergies  Allergen Reactions  . Tramadol     Medication caused dizziness    SURGICAL HISTORY She  has a past surgical history that includes Cataract extraction (Bilateral, 2009); Cesarean section; Melanoma excision (Right, 2014); Breast biopsy (Right, 05/28/2010); and Breast lumpectomy (Right, 06/29/2010). FAMILY HISTORY Her family history includes Arthritis in her mother; Cancer in her sister; Diabetes in her maternal grandmother; Hypertension in her sister; Rheum arthritis in her mother. SOCIAL HISTORY She  reports that she quit smoking about 40 years ago. She quit after 10.00 years of use. She has never used smokeless tobacco. She reports that she drinks about 2.0 oz of alcohol per week . She reports that she does not use drugs.   MEDICARE WELLNESS OBJECTIVES: Physical activity: Current Exercise Habits: The patient does not participate in regular exercise at present Cardiac risk factors: Cardiac Risk Factors include: advanced age (>21men, >20 women);sedentary lifestyle Depression/mood screen:   Depression screen PHQ  2/9 08/22/2016  Decreased Interest 0  Down, Depressed, Hopeless 0  PHQ - 2 Score 0    ADLs:  In your present state of health, do you have any difficulty performing the following activities: 08/22/2016 08/19/2016  Hearing? N N  Vision? N N  Difficulty concentrating or making decisions? N N  Walking or climbing stairs? N N  Dressing or bathing? N N  Doing errands, shopping? N -  Some recent data might be hidden     Cognitive Testing  Alert? Yes  Normal Appearance?Yes  Oriented to person? Yes  Place? Yes   Time? Yes  Recall of three objects?  Yes  Can perform simple calculations? Yes  Displays appropriate judgment?Yes  Can read the correct time from a watch face?Yes  EOL planning: Does Patient Have a Medical Advance Directive?: Yes Type of Advance  Directive: Out of facility DNR (pink MOST or yellow form) Does patient want to make changes to medical advance directive?: Yes (MAU/Ambulatory/Procedural Areas - Information given) Would patient like information on creating a medical advance directive?: Yes (MAU/Ambulatory/Procedural Areas - Information given) Pre-existing out of facility DNR order (yellow form or pink MOST form): Yellow form placed in chart (order not valid for inpatient use)   Review of Systems: Review of Systems  Constitutional: Negative for chills, fever and malaise/fatigue.  HENT: Negative for congestion, ear pain and sore throat.   Eyes: Negative.   Respiratory: Negative for cough, shortness of breath and wheezing.   Cardiovascular: Negative for chest pain, palpitations and leg swelling.  Gastrointestinal: Negative for abdominal pain, blood in stool, constipation, diarrhea, heartburn and melena.  Genitourinary: Negative.   Musculoskeletal: Positive for back pain and falls. Negative for joint pain, myalgias and neck pain.  Skin: Negative.   Neurological: Negative for dizziness, sensory change, loss of consciousness and headaches.  Psychiatric/Behavioral: Negative for depression. The patient is not nervous/anxious and does not have insomnia.     Physical Exam: Estimated body mass index is 23.76 kg/m as calculated from the following:   Height as of this encounter: 5' 2.5" (1.588 m).   Weight as of this encounter: 132 lb (59.9 kg). BP 120/72   Pulse 86   Temp 97.6 F (36.4 C)   Resp 14   Ht 5' 2.5" (1.588 m)   Wt 132 lb (59.9 kg)   SpO2 96%   BMI 23.76 kg/m   General Appearance: Well nourished well developed, in no apparent distress.  Eyes: PERRLA, EOMs, conjunctiva no swelling or erythema ENT/Mouth: Ear canals normal without obstruction, swelling, erythema, or discharge.  TMs normal bilaterally with no erythema, bulging, retraction, or loss of landmark.  Oropharynx moist and clear with no exudate, erythema,  or swelling.   Neck: Supple, thyroid normal. No bruits.  No cervical adenopathy Respiratory: Respiratory effort normal, Breath sounds clear A&P without wheeze, rhonchi, rales.   Cardio: RRR without murmurs, rubs or gallops. Brisk peripheral pulses without edema.  Chest: symmetric, with normal excursions Breasts: defer Abdomen: Soft, nontender, no guarding, rebound, hernias, masses, or organomegaly.  Lymphatics: Non tender without lymphadenopathy.  Genitourinary: defer Musculoskeletal: Full ROM all peripheral extremities,5/5 strength, and antalgic gait, negative straight leg Skin: Warm, dry without rashes, lesions, ecchymosis. Neuro: Awake and oriented X 3, Cranial nerves intact, reflexes equal bilaterally. Normal muscle tone, no cerebellar symptoms. Sensation intact.  Psych:  normal affect, Insight and Judgment appropriate.   Medicare Attestation I have personally reviewed: The patient's medical and social history Their use of alcohol, tobacco or illicit  drugs Their current medications and supplements The patient's functional ability including ADLs,fall risks, home safety risks, cognitive, and hearing and visual impairment Diet and physical activities Evidence for depression or mood disorders  The patient's weight, height, BMI, and visual acuity have been recorded in the chart.  I have made referrals, counseling, and provided education to the patient based on review of the above and I have provided the patient with a written personalized care plan for preventive services.  Over 40 minutes of exam, counseling, chart review was performed.   Vicie Mutters 10:03 AM [facility]

## 2016-08-20 NOTE — Telephone Encounter (Signed)
Appointment scheduled.

## 2016-08-20 NOTE — Telephone Encounter (Signed)
Should see APP .  Can put in with Nicoletta Ba PA tomorrow pm

## 2016-08-21 ENCOUNTER — Ambulatory Visit (INDEPENDENT_AMBULATORY_CARE_PROVIDER_SITE_OTHER): Payer: Medicare Other | Admitting: Physician Assistant

## 2016-08-21 ENCOUNTER — Encounter: Payer: Self-pay | Admitting: Physician Assistant

## 2016-08-21 VITALS — BP 136/70 | HR 84 | Ht 62.5 in | Wt 133.1 lb

## 2016-08-21 DIAGNOSIS — K8689 Other specified diseases of pancreas: Secondary | ICD-10-CM

## 2016-08-21 DIAGNOSIS — K869 Disease of pancreas, unspecified: Secondary | ICD-10-CM

## 2016-08-21 NOTE — Progress Notes (Addendum)
Subjective:    Patient ID: Victoria Long, female    DOB: 1928-09-11, 83 y.o.   MRN: [id number]  HPI Victoria Long is a very nice 83 year old white female, referred today by Dr. Unk Pinto and known very remotely to Dr. Carlean Purl from colonoscopy done in 2004. Patient is generally in good health with previous history of right breast cancer stage I A. Patient was vacationing in hasn't had last week and took a fall landing on her back. She had an ER visit with CT of the lumbar spine done without IV contrast with incidental finding of a 3.8 x 3.2 x 4 cm macrocystic lesion in the pancreatic tail with internal calcification felt most consistent with a mucinous cystic neoplasm. Patient has no previous abdominal imaging for comparison. She has no current GI complaints. Very specifically states that she's been feeling just fine, denies any problems with abdominal pain or discomfort. Her appetite has been fine her weight has been stable. Bowel movements have been normal. She was found to have a fracture of the L1-L2 spinous process on that CT as well. She has had a lot of pain which seems to be improving.  Review of Systems Pertinent positive and negative review of systems were noted in the above HPI section.  All other review of systems was otherwise negative.  Outpatient Encounter Prescriptions as of 08/21/2016  Medication Sig  . aspirin 81 MG tablet Take 81 mg by mouth daily.  . calcium carbonate (OS-CAL) 600 MG TABS Take 600 mg by mouth 2 (two) times daily with a meal.   . Cholecalciferol (VITAMIN D) 1000 UNITS capsule Take 1,000 Units by mouth 2 (two) times daily.   Marland Kitchen HYDROcodone-acetaminophen (NORCO) 5-325 MG tablet Take 1/2 to 1 tablet 3 to 4 x / day only if needed for severe pain  . LYSINE HCL PO Take 500 mg by mouth daily.   . Multiple Vitamins-Minerals (MULTIVITAMIN PO) Take by mouth daily.  . Omega-3 Fatty Acids (FISH OIL PO) Take by mouth daily. Mega Red   No facility-administered encounter  medications on file as of 08/21/2016.    Allergies  Allergen Reactions  . Tramadol     Medication caused dizziness   Patient Active Problem List   Diagnosis Date Noted  . Breast cancer, right breast ([place]) 10/25/2013  . Encounter for long-term (current) use of medications 04/19/2013  . Hyperlipidemia   . Prediabetes   . Vitamin D deficiency    Social History   Social History  . Marital status: Widowed    Spouse name: N/A  . Number of children: 4  . Years of education: N/A   Occupational History  . Not on file.   Social History Main Topics  . Smoking status: Former Smoker    Years: 10.00    Quit date: 08/21/1976  . Smokeless tobacco: Never Used  . Alcohol use 2.0 oz/week    4 Standard drinks or equivalent per week     Comment: wine  . Drug use: No  . Sexual activity: Not on file   Other Topics Concern  . Not on file   Social History Narrative  . No narrative on file    Victoria Long's family history includes Arthritis in her mother; Cancer in her sister; Diabetes in her maternal grandmother; Hypertension in her sister; Rheum arthritis in her mother.      Objective:    Vitals:   08/21/16 1455  BP: 136/70  Pulse: 84    Physical Exam  well-developed elderly white female in no acute distress, accompanied by her son, patient appears younger than stated age blood pressure 136/70 pulse 84, height 5 foot 2, weight 133, BMI of 23.9. HEENT ;nontraumatic, cephalic EOMI PERRLA sclera anicteric, Cardiovascular; regular rate and rhythm with S1-S2 no murmur or gallop, Pulmonary; clear bilaterally, Abdomen; soft, nontender nondistended bowel sounds are active there is no palpable mass or hepatosplenomegaly, Rectal ;exam not done, Extremities; no clubbing cyanosis or edema skin warm and dry, Neuropsych; mood and affect appropriate       Assessment & Plan:   #29 83 year old white female with incidental noncontrasted CT finding of a 3.8 x 3.2 x 4 cm pancreatic tail macro cystic  lesion with internal calcification, concerning for a mucinous cystic neoplasm Patient is asymptomatic  #2 recent fall with L1-L2 spinous process fracture #3 history of stage IA right breast CA  Plan; Long discussion today with the patient and her son regarding CT findings. Further imaging with MRI is recommended as the next step. Depending on MRI findings, EUS with FNA may be recommended . Patient is not sure that she wants to pursue any further workup. She would like to discuss further with her family, and call back to schedule MRI if that decision is made. We reviewed malignant potential for mucinous cystic neoplasms and potential recommendation for resection. She may opt for simple observation given her advanced age. We will await patient's decision for further imaging with MRI. Note patient has had recent labs including CBC and hepatic panel within the past month both within normal limits.  Amy S Esterwood PA-C 08/21/2016   Cc: Unk Pinto, MD  Agree with Ms. Genia Harold assessment and plan. Seems unlikely this lesion will have sig clinical impact and would try to avoid invasive procedures unless they would have potential for significant benefit. [name], MD, [facility]

## 2016-08-21 NOTE — Patient Instructions (Addendum)
Please call back and ask for Dr. Celesta Aver nurse to discuss scheduling an MRI of the pancreas.

## 2016-08-22 ENCOUNTER — Encounter: Payer: Self-pay | Admitting: Physician Assistant

## 2016-08-22 ENCOUNTER — Ambulatory Visit (INDEPENDENT_AMBULATORY_CARE_PROVIDER_SITE_OTHER): Payer: Medicare Other | Admitting: Physician Assistant

## 2016-08-22 VITALS — BP 120/72 | HR 86 | Temp 97.6°F | Resp 14 | Ht 62.5 in | Wt 132.0 lb

## 2016-08-22 DIAGNOSIS — R6889 Other general symptoms and signs: Secondary | ICD-10-CM

## 2016-08-22 DIAGNOSIS — Z0001 Encounter for general adult medical examination with abnormal findings: Secondary | ICD-10-CM

## 2016-08-22 DIAGNOSIS — E785 Hyperlipidemia, unspecified: Secondary | ICD-10-CM

## 2016-08-22 DIAGNOSIS — E559 Vitamin D deficiency, unspecified: Secondary | ICD-10-CM

## 2016-08-22 DIAGNOSIS — I700 Atherosclerosis of aorta: Secondary | ICD-10-CM | POA: Diagnosis not present

## 2016-08-22 DIAGNOSIS — Z7189 Other specified counseling: Secondary | ICD-10-CM

## 2016-08-22 DIAGNOSIS — Z853 Personal history of malignant neoplasm of breast: Secondary | ICD-10-CM

## 2016-08-22 DIAGNOSIS — Z85820 Personal history of malignant melanoma of skin: Secondary | ICD-10-CM

## 2016-08-22 DIAGNOSIS — Z1389 Encounter for screening for other disorder: Secondary | ICD-10-CM

## 2016-08-22 DIAGNOSIS — Z79899 Other long term (current) drug therapy: Secondary | ICD-10-CM

## 2016-08-22 DIAGNOSIS — K8689 Other specified diseases of pancreas: Secondary | ICD-10-CM | POA: Insufficient documentation

## 2016-08-22 DIAGNOSIS — Z0000 Encounter for general adult medical examination without abnormal findings: Secondary | ICD-10-CM

## 2016-08-22 DIAGNOSIS — R7303 Prediabetes: Secondary | ICD-10-CM

## 2016-08-22 DIAGNOSIS — S32009A Unspecified fracture of unspecified lumbar vertebra, initial encounter for closed fracture: Secondary | ICD-10-CM

## 2016-08-22 LAB — LIPID PANEL
CHOL/HDL RATIO: 3.4 ratio (ref <5.0)
Cholesterol: 219 mg/dL — ABNORMAL HIGH (ref <200)
HDL: 64 mg/dL (ref >50)
LDL Cholesterol: 136 mg/dL — ABNORMAL HIGH (ref <100)
Triglycerides: 96 mg/dL (ref <150)
VLDL: 19 mg/dL (ref <30)

## 2016-08-22 MED ORDER — PREDNISONE 20 MG PO TABS: ORAL_TABLET | ORAL | 0 refills | Status: AC

## 2016-08-22 NOTE — Patient Instructions (Addendum)
Will start you on prednisone for back pain/inflammation Take this for 2-3 days and then start to taper down on the hydrocodone Take 1 hydrocodone in the morning but start to taper the day time doses to 1/2 a pill Will refer to ortho    Advance Directive Advance directives are legal documents that let you make choices ahead of time about your health care and medical treatment in case you become unable to communicate for yourself. Advance directives are a way for you to communicate your wishes to family, friends, and health care providers. This can help convey your decisions about end-of-life care if you become unable to communicate. Discussing and writing advance directives should happen over time rather than all at once. Advance directives can be changed depending on your situation and what you want, even after you have signed the advance directives. If you do not have an advance directive, some states assign family decision makers to act on your behalf based on how closely you are related to them. Each state has its own laws regarding advance directives. You may want to check with your health care provider, attorney, or state representative about the laws in your state. There are different types of advance directives, such as:  Medical power of attorney.  Living will.  Do not resuscitate (DNR) or do not attempt resuscitation (DNAR) order.  Health care proxy and medical power of attorney A health care proxy, also called a health care agent, is a person who is appointed to make medical decisions for you in cases in which you are unable to make the decisions yourself. Generally, people choose someone they know well and trust to represent their preferences. Make sure to ask this person for an agreement to act as your proxy. A proxy may have to exercise judgment in the event of a medical decision for which your wishes are not known. A medical power of attorney is a legal document that names your  health care proxy. Depending on the laws in your state, after the document is written, it may also need to be:  Signed.  Notarized.  Dated.  Copied.  Witnessed.  Incorporated into your medical record.  You may also want to appoint someone to manage your financial affairs in a situation in which you are unable to do so. This is called a durable power of attorney for finances. It is a separate legal document from the durable power of attorney for health care. You may choose the same person or someone different from your health care proxy to act as your agent in financial matters. If you do not appoint a proxy, or if there is a concern that the proxy is not acting in your best interests, a court-appointed guardian may be designated to act on your behalf. Living will A living will is a set of instructions documenting your wishes about medical care when you cannot express them yourself. Health care providers should keep a copy of your living will in your medical record. You may want to give a copy to family members or friends. To alert caregivers in case of an emergency, you can place a card in your wallet to let them know that you have a living will and where they can find it. A living will is used if you become:  Terminally ill.  Incapacitated.  Unable to communicate or make decisions.  Items to consider in your living will include:  The use or non-use of life-sustaining equipment, such as dialysis machines  and breathing machines (ventilators).  A DNR or DNAR order, which is the instruction not to use cardiopulmonary resuscitation (CPR) if breathing or heartbeat stops.  The use or non-use of tube feeding.  Withholding of food and fluids.  Comfort (palliative) care when the goal becomes comfort rather than a cure.  Organ and tissue donation.  A living will does not give instructions for distributing your money and property if you should pass away. It is recommended that you seek  the advice of a lawyer when writing a will. Decisions about taxes, beneficiaries, and asset distribution will be legally binding. This process can relieve your family and friends of any concerns surrounding disputes or questions that may come up about the distribution of your assets. DNR or DNAR A DNR or DNAR order is a request not to have CPR in the event that your heart stops beating or you stop breathing. If a DNR or DNAR order has not been made and shared, a health care provider will try to help any patient whose heart has stopped or who has stopped breathing. If you plan to have surgery, talk with your health care provider about how your DNR or DNAR order will be followed if problems occur. Summary  Advance directives are the legal documents that allow you to make choices ahead of time about your health care and medical treatment in case you become unable to communicate for yourself.  The process of discussing and writing advance directives should happen over time. You can change the advance directives, even after you have signed them.  Advance directives include DNR or DNAR orders, living wills, and designating an agent as your medical power of attorney. This information is not intended to replace advice given to you by your health care provider. Make sure you discuss any questions you have with your health care provider. Document Released: 04/16/2007 Document Revised: 11/27/2015 Document Reviewed: 11/27/2015 Elsevier Interactive Patient Education  2017 [employer].

## 2016-08-23 LAB — URINALYSIS, ROUTINE W REFLEX MICROSCOPIC
BILIRUBIN URINE: NEGATIVE
GLUCOSE, UA: NEGATIVE
Hgb urine dipstick: NEGATIVE
KETONES UR: NEGATIVE
Leukocytes, UA: NEGATIVE
Nitrite: NEGATIVE
PH: 6.5 (ref 5.0–8.0)
Protein, ur: NEGATIVE
SPECIFIC GRAVITY, URINE: 1.013 (ref 1.001–1.035)

## 2016-08-23 LAB — HEMOGLOBIN A1C
Hgb A1c MFr Bld: 5.6 % (ref <5.7)
MEAN PLASMA GLUCOSE: 114 mg/dL

## 2016-08-23 LAB — MICROALBUMIN / CREATININE URINE RATIO
CREATININE, URINE: 63 mg/dL (ref 20–320)
Microalb Creat Ratio: 6 mcg/mg creat (ref <30)
Microalb, Ur: 0.4 mg/dL

## 2016-08-23 NOTE — Progress Notes (Signed)
Pt aware of lab results & voiced understanding of those results.

## 2016-08-28 ENCOUNTER — Telehealth: Payer: Self-pay

## 2016-08-28 NOTE — Telephone Encounter (Signed)
Pt was informed of why she needed ortho referral. Pt voiced understanding & agreed & hung up.

## 2016-08-28 NOTE — Telephone Encounter (Signed)
-----   Message from Vicie Mutters, [place] sent at 08/28/2016  1:51 PM EDT ----- Regarding: RE: question Due to pain and vertebral fracture, want her plugged in somewhere for follow up [place] ----- Message ----- From: Elenor Quinones, CMA Sent: 08/27/2016  11:16 AM To: Vicie Mutters, PA-C Subject: question                                       Pt would like to talk to you about the ortho referral. She states she just has some things to ask you about. No rush per pt.

## 2016-10-01 ENCOUNTER — Encounter: Payer: Self-pay | Admitting: Physician Assistant

## 2016-10-01 DIAGNOSIS — I10 Essential (primary) hypertension: Secondary | ICD-10-CM | POA: Insufficient documentation

## 2016-10-31 ENCOUNTER — Ambulatory Visit (INDEPENDENT_AMBULATORY_CARE_PROVIDER_SITE_OTHER): Payer: Medicare Other

## 2016-10-31 DIAGNOSIS — Z23 Encounter for immunization: Secondary | ICD-10-CM

## 2016-11-07 ENCOUNTER — Ambulatory Visit (INDEPENDENT_AMBULATORY_CARE_PROVIDER_SITE_OTHER): Payer: Medicare Other | Admitting: Adult Health

## 2016-11-07 ENCOUNTER — Encounter: Payer: Self-pay | Admitting: Adult Health

## 2016-11-07 VITALS — BP 106/62 | HR 80 | Temp 97.5°F | Resp 16 | Ht 62.5 in | Wt 130.2 lb

## 2016-11-07 DIAGNOSIS — H10503 Unspecified blepharoconjunctivitis, bilateral: Secondary | ICD-10-CM

## 2016-11-07 MED ORDER — NEOMYCIN-POLYMYXIN-DEXAMETH 3.5-10000-0.1 OP SUSP: 1.0000 [drp] | Freq: Four times a day (QID) | OPHTHALMIC | 0 refills | Status: DC

## 2016-11-07 NOTE — Progress Notes (Signed)
Assessment and Plan: Victoria Long was seen today for acute visit.  Diagnoses and all orders for this visit:  Blepharoconjunctivitis of both eyes, unspecified blepharoconjunctivitis type -     neomycin-polymyxin b-dexamethasone (MAXITROL) 3.5-10000-0.1 SUSP; Place 1 drop into both eyes every 6 (six) hours.  Call if the symptoms worsen significantly, or are not improving in 1 week. Present to ER with severe eye pain/HA or sudden changes in vision.   Further disposition pending results of labs. Discussed med's effects and SE's.   Over 15 minutes of exam, counseling, chart review, and critical decision making was performed.   Future Appointments Date Time Provider [place]  03/27/2017 11:00 AM Unk Pinto, MD [facility] None  09/02/2017 10:00 AM Vicie Mutters, PA-C [facility] None    ------------------------------------------------------------------------------------------------------------------   HPI BP 106/62   Pulse 80   Temp (!) 97.5 F (36.4 C)   Resp 16   Ht 5' 2.5" (1.588 m)   Wt 130 lb 3.2 oz (59.1 kg)   BMI 23.43 kg/m   83 y.o.female presents for eye redness- She reports this began with her experiencing a gritty sensation in her L eye 1 week ago which subsided after she irrigated her eye. The next day she noted irritation/redness of cornea, watery, and following day noted crusty discharge matted on eyelashes. She is now experiencing similar symptoms in her R eye.  Denies changes in her vision.   She has tried using an OTC eye irrigation solution regularly which has not seemed to help.   Past Medical History:  Diagnosis Date  . Arthritis    false + RF, negative anticcp  . Cancer [facility])    right breast cancer  . Hyperlipidemia   . Melanoma ([place]) 2014   right humerus  . Personal history of radiation therapy 2012  . Prediabetes   . Vitamin D deficiency      Allergies  Allergen Reactions  . Tramadol     Medication caused dizziness    Current Outpatient  Prescriptions on File Prior to Visit  Medication Sig  . aspirin 81 MG tablet Take 81 mg by mouth daily.  . calcium carbonate (OS-CAL) 600 MG TABS Take 600 mg by mouth 2 (two) times daily with a meal.   . Cholecalciferol (VITAMIN D) 1000 UNITS capsule Take 1,000 Units by mouth 2 (two) times daily.   Marland Kitchen LYSINE HCL PO Take 500 mg by mouth daily.   . Multiple Vitamins-Minerals (MULTIVITAMIN PO) Take by mouth daily.  . Omega-3 Fatty Acids (FISH OIL PO) Take by mouth daily. Mega Red   No current facility-administered medications on file prior to visit.     ROS: all negative except above.   Physical Exam:  BP 106/62   Pulse 80   Temp (!) 97.5 F (36.4 C)   Resp 16   Ht 5' 2.5" (1.588 m)   Wt 130 lb 3.2 oz (59.1 kg)   BMI 23.43 kg/m   General Appearance: Well nourished, in no apparent distress. Eyes: PERRLA, EOMs, with bilateral sclera injected, watery discharge bilaterally, L lower conjunctiva injected/enflamed with scant purulent discharge to inner rim. Lashes with crusted discharge bilaterally.  Sinuses: No Frontal/maxillary tenderness ENT/Mouth: Ext aud canals clear with dry flaky cerumen present bilaterally limiting visualization of TMs. No erythema, swelling, or exudate on post pharynx.  Tonsils not swollen or erythematous. Hearing normal.  Neck: Supple  Respiratory: Respiratory effort normal, BS equal bilaterally without rales, rhonchi, wheezing or stridor.  Cardio: RRR with no MRGs.  Lymphatics: Non  tender without lymphadenopathy.  Skin: Warm, dry without rashes, lesions, ecchymosis.  Psych: Awake and oriented X 3, normal affect, Insight and Judgment appropriate.     Victoria Ribas, NP 11:36 AM [facility]

## 2016-11-07 NOTE — Patient Instructions (Addendum)
Pick up some debrox OTC for your ear wax- few drops in 1 ear at a time, let sit for 5 min or so and irrigate with warm water in a bulb syringe.   Try fluticasone/flonase nasal spray for a few days for your ears pressure.    Viral Conjunctivitis, Adult Viral conjunctivitis is an inflammation of the clear membrane that covers the white part of your eye and the inner surface of your eyelid (conjunctiva). The inflammation is caused by a viral infection. The blood vessels in the conjunctiva become inflamed, causing the eye to become red or pink, and often itchy. Viral conjunctivitis can be easily passed from one person to another (is contagious). This condition is often called pink eye. What are the causes? This condition is caused by a virus. A virus is a type of contagious germ. It can be spread by touching objects that have been contaminated with the virus, such as doorknobs or towels. It can also be passed through droplets, such as from coughing or sneezing. What are the signs or symptoms? Symptoms of this condition include:  Eye redness.  Tearing or watery eyes.  Itchy and irritated eyes.  Burning feeling in the eyes.  Clear drainage from the eye.  Swollen eyelids.  A gritty feeling in the eye.  Light sensitivity.  This condition often occurs with other symptoms, such as a fever, nausea, or a rash. How is this diagnosed? This condition is diagnosed with a medical history and physical exam. If you have discharge from your eye, the discharge may be tested to rule out other causes of conjunctivitis. How is this treated? Viral conjunctivitis does not respond to medicines that kill bacteria (antibiotics). Treatment for viral conjunctivitis is directed at stopping a bacterial infection from developing in addition to the viral infection. Treatment also aims to relieve your symptoms, such as itching. This may be done with antihistamine drops or other eye medicines. Rarely, steroid eye drops  or antiviral medicines may be prescribed. Follow these instructions at home: Medicines   Take or apply over-the-counter and prescription medicines only as told by your health care provider.  Be very careful to avoid touching the edge of the eyelid with the eye drop bottle or ointment tube when applying medicines to the affected eye. Being careful this way will stop you from spreading the infection to the other eye or to other people. Eye care  Avoid touching or rubbing your eyes.  Apply a warm, wet, clean washcloth to your eye for 10-20 minutes, 3-4 times per day or as told by your health care provider.  If you wear contact lenses, do not wear them until the inflammation is gone and your health care provider says it is safe to wear them again. Ask your health care provider how to sterilize or replace your contact lenses before using them again. Wear glasses until you can resume wearing contacts.  Avoid wearing eye makeup until the inflammation is gone. Throw away any old eye cosmetics that may be contaminated.  Gently wipe away any drainage from your eye with a warm, wet washcloth or a cotton ball. General instructions  Change or wash your pillowcase every day or as told by your health care provider.  Do not share towels, pillowcases, washcloths, eye makeup, makeup brushes, contact lenses, or glasses. This may spread the infection.  Wash your hands often with soap and water. Use paper towels to dry your hands. If soap and water are not available, use hand sanitizer.  Try to avoid contact with other people for one week or as told by your health care provider. Contact a health care provider if:  Your symptoms do not improve with treatment or they get worse.  You have increased pain.  Your vision becomes blurry.  You have a fever.  You have facial pain, redness, or swelling.  You have yellow or green drainage coming from your eye.  You have new symptoms. This information is  not intended to replace advice given to you by your health care provider. Make sure you discuss any questions you have with your health care provider. Document Released: 03/30/2002 Document Revised: 08/05/2015 Document Reviewed: 07/25/2015 Elsevier Interactive Patient Education  2017 [employer].

## 2016-11-14 ENCOUNTER — Ambulatory Visit (INDEPENDENT_AMBULATORY_CARE_PROVIDER_SITE_OTHER): Payer: Medicare Other | Admitting: Physician Assistant

## 2016-11-14 VITALS — BP 110/68 | HR 82 | Temp 97.4°F | Resp 14 | Ht 62.5 in | Wt 130.2 lb

## 2016-11-14 DIAGNOSIS — H10503 Unspecified blepharoconjunctivitis, bilateral: Secondary | ICD-10-CM

## 2016-11-14 NOTE — Progress Notes (Signed)
Subjective:    Patient ID: Victoria Long, female    DOB: December 05, 1928, 83 y.o.   MRN: [id number]  HPI 83 y.o. WF presents with eye issues x 2 weeks.  She was seen 11/07/16 has been on maxitrol but states it has not improved. She has also seen her eye doctor. Worse in left eye but in both eyes, the discharge has stopped, no itching, burns some with medication in the eye. Puts drops in every 6 hours.   Blood pressure 110/68, pulse 82, temperature (!) 97.4 F (36.3 C), resp. rate 14, height 5' 2.5" (1.588 m), weight 130 lb 3.2 oz (59.1 kg), SpO2 97 %.  Medications Current Outpatient Prescriptions on File Prior to Visit  Medication Sig  . aspirin 81 MG tablet Take 81 mg by mouth daily.  . calcium carbonate (OS-CAL) 600 MG TABS Take 600 mg by mouth 2 (two) times daily with a meal.   . Cholecalciferol (VITAMIN D) 1000 UNITS capsule Take 1,000 Units by mouth 2 (two) times daily.   Marland Kitchen LYSINE HCL PO Take 500 mg by mouth daily.   . Multiple Vitamins-Minerals (MULTIVITAMIN PO) Take by mouth daily.  Marland Kitchen neomycin-polymyxin b-dexamethasone (MAXITROL) 3.5-10000-0.1 SUSP Place 1 drop into both eyes every 6 (six) hours.  . Omega-3 Fatty Acids (FISH OIL PO) Take by mouth daily. Mega Red   No current facility-administered medications on file prior to visit.     Problem list She has Hyperlipidemia; Prediabetes; Vitamin D deficiency; Encounter for long-term (current) use of medications; History of right breast cancer; Pancreatic mass; Lumbar verterbral fracture, traumatic; and Essential hypertension on her problem list.   Review of Systems  Constitutional: Negative.  Negative for chills and fever.  HENT: Negative.   Eyes: Positive for redness and itching. Negative for photophobia, pain, discharge and visual disturbance.  Respiratory: Negative.   Neurological: Negative.        Objective:   Physical Exam  Constitutional: She is oriented to person, place, and time. She appears well-developed and  well-nourished.  HENT:  Head: Normocephalic and atraumatic.  Right Ear: External ear normal.  Left Ear: External ear normal.  Mouth/Throat: Oropharynx is clear and moist.  Eyes: Pupils are equal, round, and reactive to light. EOM are normal. Lids are everted and swept, no foreign bodies found. Right eye exhibits discharge. Right eye exhibits no hordeolum. No foreign body present in the right eye. Left eye exhibits no discharge and no hordeolum. No foreign body present in the left eye. Right conjunctiva is injected. Left conjunctiva is injected.  Neck: Normal range of motion. Neck supple. No thyromegaly present.  Cardiovascular: Normal rate, regular rhythm and normal heart sounds.  Exam reveals no gallop and no friction rub.   No murmur heard. Pulmonary/Chest: Effort normal and breath sounds normal. No respiratory distress. She has no wheezes.  Abdominal: Soft. Bowel sounds are normal. She exhibits no distension and no mass. There is no tenderness. There is no rebound and no guarding.  Musculoskeletal: Normal range of motion.  Lymphadenopathy:    She has no cervical adenopathy.  Neurological: She is alert and oriented to person, place, and time. She displays normal reflexes. No cranial nerve deficit. Coordination normal.  Skin: Skin is warm and dry.  Psychiatric: She has a normal mood and affect.       Assessment & Plan:   Blepharoconjunctivitis of both eyes, unspecified blepharoconjunctivitis type Stop the maxitrol, just do dry eyes, get on claritin, if not better Monday call the office  Has improved

## 2016-11-14 NOTE — Patient Instructions (Signed)
Stop the maxitrol Can do artifical tear drops  Claritin or loratadine cheapest but likely the weakest  Get on it for 2-4 weeks Cheapest at [facility], [facility], [facility]  If not better let use know and I will send in different drops for you   Allergic Conjunctivitis, Adult Allergic conjunctivitis is inflammation of the clear membrane that covers the white part of your eye and the inner surface of your eyelid (conjunctiva). The inflammation is caused by allergies. The blood vessels in the conjunctiva become inflamed and this causes the eyes to become red or pink. The eyes often feel itchy. Allergic conjunctivitis cannot be spread from one person to another person (is not contagious). What are the causes? This condition is caused by an allergic reaction. Common causes of an allergic reaction (allergens) include:  Outdoor allergens, such as: ? Pollen. ? Grass and weeds. ? Mold spores.  Indoor allergens, such as: ? Dust. ? Smoke. ? Mold. ? Pet dander. ? Animal hair.  What increases the risk? You may be more likely to develop this condition if you have a family history of allergies, such as:  Allergic rhinitis.  Bronchial asthma.  Atopic dermatitis.  What are the signs or symptoms? Symptoms of this condition include eyes that are:  Itchy.  Red.  Watery.  Puffy.  Your eyes may also:  Sting or burn.  Have clear drainage coming from them.  How is this diagnosed? This condition may be diagnosed by medical history and physical exam. If you have drainage from your eyes, it may be tested to rule out other causes of conjunctivitis. You may also need to see a health care provider who specializes in treating allergies (allergist) or eye conditions (ophthalmologist) for tests to confirm the diagnosis. You may have:  Skin tests to see which allergens are causing your symptoms. These tests involve pricking the skin with a tiny needle and exposing the skin to small amounts of potential  allergens to see if your skin reacts.  Blood tests.  Tissue scrapings from your eyelid. These will be examined under a microscope.  How is this treated? Treatments for this condition may include:  Cold cloths (compresses) to soothe itching and swelling.  Washing the face to remove allergens.  Eye drops. These may be prescription or over-the-counter. There are several different types. You may need to try different types to see which one works best for you. Your may need: ? Eye drops that block the allergic reaction (antihistamine). ? Eye drops that reduce swelling and irritation (anti-inflammatory). ? Steroid eye drops to lessen a severe reaction (vernal conjunctivitis).  Oral antihistamine medicines to reduce your allergic reaction. You may need these if eye drops do not help or are difficult to use.  Follow these instructions at home:  Avoid known allergens whenever possible.  Take or apply over-the-counter and prescription medicines only as told by your health care provider. These include any eye drops.  Apply a cool, clean washcloth to your eye for 10-20 minutes, 3-4 times a day.  Do not touch or rub your eyes.  Do not wear contact lenses until the inflammation is gone. Wear glasses instead.  Do not wear eye makeup until the inflammation is gone.  Keep all follow-up visits as told by your health care provider. This is important. Contact a health care provider if:  Your symptoms get worse or do not improve with treatment.  You have mild eye pain.  You have sensitivity to light.  You have spots or  blisters on your eyes.  You have pus draining from your eye.  You have a fever. Get help right away if:  You have redness, swelling, or other symptoms in only one eye.  Your vision is blurred or you have vision changes.  You have severe eye pain. This information is not intended to replace advice given to you by your health care provider. Make sure you discuss any  questions you have with your health care provider. Document Released: 03/30/2002 Document Revised: 09/06/2015 Document Reviewed: 07/21/2015 Elsevier Interactive Patient Education  2018 [employer].

## 2016-11-18 ENCOUNTER — Other Ambulatory Visit: Payer: Self-pay | Admitting: Physician Assistant

## 2016-11-18 ENCOUNTER — Telehealth: Payer: Self-pay

## 2016-11-18 MED ORDER — DEXAMETHASONE 0.1 % OP SUSP: OPHTHALMIC | 0 refills | Status: DC

## 2016-11-18 NOTE — Telephone Encounter (Signed)
Pt reports eye pain w/blurred vision in the AM but gets clearer thru out the day & some redness & swelling.  Per provider some eye drops have been called into pharmacy & if eyes don't get better or get worse to go to the ER.  Pt voiced understanding & hung up.

## 2016-12-25 ENCOUNTER — Ambulatory Visit: Payer: Medicare Other | Admitting: Physician Assistant

## 2016-12-25 ENCOUNTER — Encounter: Payer: Self-pay | Admitting: Physician Assistant

## 2016-12-25 VITALS — BP 118/66 | Temp 97.3°F | Resp 14 | Ht 62.5 in | Wt 137.0 lb

## 2016-12-25 DIAGNOSIS — R29898 Other symptoms and signs involving the musculoskeletal system: Secondary | ICD-10-CM

## 2016-12-25 DIAGNOSIS — H6123 Impacted cerumen, bilateral: Secondary | ICD-10-CM | POA: Diagnosis not present

## 2016-12-25 DIAGNOSIS — H9203 Otalgia, bilateral: Secondary | ICD-10-CM | POA: Diagnosis not present

## 2016-12-25 DIAGNOSIS — R2689 Other abnormalities of gait and mobility: Secondary | ICD-10-CM

## 2016-12-25 NOTE — Progress Notes (Signed)
   Subjective:    Patient ID: Victoria Long, female    DOB: [birth year], 83 y.o.   MRN: [id number]  HPI 83 y.o. WF presents with decreased hearing and bilateral ear pain x 3 days. She is not on Claritin for her watery eyes but she denies sinus issues. No fever, chills.   She has had some leg weakness with standing since her fall in July. She denies any numbness tingling in her leg, no pain down her legs, some right knee pain, no back pain. No incontinence. States has not been as active with her fall. Has had some imbalance, no headache.   Blood pressure 118/66, temperature (!) 97.3 F (36.3 C), resp. rate 14, height 5' 2.5" (1.588 m), weight 137 lb (62.1 kg).  Medications Current Outpatient Medications on File Prior to Visit  Medication Sig  . aspirin 81 MG tablet Take 81 mg by mouth daily.  . calcium carbonate (OS-CAL) 600 MG TABS Take 600 mg by mouth 2 (two) times daily with a meal.   . Cholecalciferol (VITAMIN D) 1000 UNITS capsule Take 1,000 Units by mouth 2 (two) times daily.   Marland Kitchen LYSINE HCL PO Take 500 mg by mouth daily.   . Multiple Vitamins-Minerals (MULTIVITAMIN PO) Take by mouth daily.  Marland Kitchen neomycin-polymyxin b-dexamethasone (MAXITROL) 3.5-10000-0.1 SUSP Place 1 drop into both eyes every 6 (six) hours.  . Omega-3 Fatty Acids (FISH OIL PO) Take by mouth daily. Mega Red  . Polyethyl Glycol-Propyl Glycol (SYSTANE OP) Apply to eye.   No current facility-administered medications on file prior to visit.     Problem list She has Hyperlipidemia; Prediabetes; Vitamin D deficiency; Encounter for long-term (current) use of medications; History of right breast cancer; Pancreatic mass; Lumbar verterbral fracture, traumatic; and Essential hypertension on their problem list.  Review of Systems     Objective:   Physical Exam  Constitutional: She appears well-developed and well-nourished. No distress.  HENT:  Canal clear after manual extraction in the office of bilateral cerumen impaction,  TMs normal without erythema, bulging.   Eyes: Conjunctivae and EOM are normal. Pupils are equal, round, and reactive to light.  Neck: Normal range of motion. Neck supple.  Cardiovascular: Normal rate and regular rhythm.  Pulmonary/Chest: Effort normal and breath sounds normal.  Musculoskeletal:  Patient is able to ambulate well. Gait is  Antalgic. Straight leg raising with dorsiflexion negative bilaterally for radicular symptoms. Sensory exam in the legs are normal. Knee reflexes are normal Ankle reflexes are normal Strength is abnormal, diffuse decreased strength. There is not SI tenderness to palpation.  There is not paraspinal muscle spasm.  There is not midline tenderness.            Assessment & Plan:   Ear pain, bilateral Excessive cerumen in ear canal, bilateral Cerumen impaction - stop using Qtips, irrigation used in the office without complications, use OTC drops/oil at home to prevent reoccurence  Leg weakness, bilateral, right knee pain and imbalance Normal neuro, no dizziness, HA May need ESR/CPK next OV, declines blood work today Will refer to PT, if worse will refer to neuro -     Ambulatory referral to Physical Therapy  Future Appointments  Date Time Provider [place]  03/27/2017 11:00 AM Unk Pinto, MD [facility] None  09/02/2017 10:00 AM Vicie Mutters, PA-C [facility] None

## 2016-12-25 NOTE — Patient Instructions (Signed)
Use a dropper or use a cap to put peroxide, olive oil,mineral oil or canola oil in the effected ear- 2-3 times a week. Let it soak for 20-30 min then you can take a shower or use a baby bulb with warm water to wash out the ear wax.  Do not use Qtips

## 2017-03-27 ENCOUNTER — Ambulatory Visit: Payer: Medicare Other | Admitting: Internal Medicine

## 2017-03-27 VITALS — BP 112/60 | HR 80 | Temp 97.3°F | Resp 16 | Ht 63.0 in | Wt 133.0 lb

## 2017-03-27 DIAGNOSIS — Z79899 Other long term (current) drug therapy: Secondary | ICD-10-CM

## 2017-03-27 DIAGNOSIS — E559 Vitamin D deficiency, unspecified: Secondary | ICD-10-CM | POA: Diagnosis not present

## 2017-03-27 DIAGNOSIS — R0989 Other specified symptoms and signs involving the circulatory and respiratory systems: Secondary | ICD-10-CM | POA: Diagnosis not present

## 2017-03-27 DIAGNOSIS — E782 Mixed hyperlipidemia: Secondary | ICD-10-CM

## 2017-03-27 DIAGNOSIS — R7309 Other abnormal glucose: Secondary | ICD-10-CM | POA: Diagnosis not present

## 2017-03-27 NOTE — Progress Notes (Signed)
This very nice 84 y.o. Long presents for 3 month follow up with HTN, HLD, Pre-Diabetes and Vitamin D Deficiency.      Patient is followed expectantly for labile  HTN & BP has been controlled at home. Today's BP is at goal - 112/60. Patient has had no complaints of any cardiac type chest pain, palpitations, dyspnea / orthopnea / PND, dizziness, claudication, or dependent edema.     Hyperlipidemia is not controlled with diet & is not treated aggressively due to age. Last Lipids were not at goal: Lab Results  Component Value Date   CHOL 219 (H) 08/22/2016   HDL 64 08/22/2016   LDLCALC 136 (H) 08/22/2016   TRIG 96 08/22/2016   CHOLHDL 3.4 08/22/2016      Also, the patient has history of PreDiabetes (A1c 5.8% / 2014)  and has had no symptoms of reactive hypoglycemia, diabetic polys, paresthesias or visual blurring.  Last A1c was  Lab Results  Component Value Date   HGBA1C 5.6 08/22/2016      Further, the patient also has history of Vitamin D Deficiency and supplements vitamin D without any suspected side-effects. Last vitamin D was sl low (goal 70-100):   Lab Results  Component Value Date   VD25OH 54 08/15/2015   Current Outpatient Medications on File Prior to Visit  Medication Sig  . aspirin 81 MG tablet Take 81 mg by mouth daily.  . calcium carbonate (OS-CAL) 600 MG TABS Take 600 mg by mouth 2 (two) times daily with a meal.   . Cholecalciferol (VITAMIN D) 1000 UNITS capsule Take 1,000 Units by mouth 2 (two) times daily.   Marland Kitchen LYSINE HCL PO Take 500 mg by mouth daily.   . Multiple Vitamins-Minerals (MULTIVITAMIN PO) Take by mouth daily.  . Omega-3 Fatty Acids (FISH OIL PO) Take by mouth daily. Mega Red   No current facility-administered medications on file prior to visit.    Allergies  Allergen Reactions  . Tramadol     Medication caused dizziness   PMHx:   Past Medical History:  Diagnosis Date  . Arthritis    false + RF, negative anticcp  . Cancer [facility])    right breast  cancer  . Hyperlipidemia   . Melanoma ([place]) 2014   right humerus  . Personal history of radiation therapy 2012  . Prediabetes   . Vitamin D deficiency    Immunization History  Administered Date(s) Administered  . Influenza, High Dose Seasonal PF 11/08/2013, 11/13/2015, 10/31/2016  . Influenza-Unspecified 11/10/2012, 10/04/2014  . Pneumococcal Conjugate-13 07/27/2013  . Pneumococcal-Unspecified 11/30/2010  . Td 02/12/2009  . Zoster 02/12/2006   Past Surgical History:  Procedure Laterality Date  . BREAST BIOPSY Right 05/28/2010  . BREAST LUMPECTOMY Right 06/29/2010   with radiation no chemo  . CATARACT EXTRACTION Bilateral 2009  . CESAREAN SECTION    . MELANOMA EXCISION Right 2014   humerus   FHx:    Reviewed / unchanged  SHx:    Reviewed / unchanged  Systems Review:  Constitutional: Denies fever, chills, wt changes, headaches, insomnia, fatigue, night sweats, change in appetite. Eyes: Denies redness, blurred vision, diplopia, discharge, itchy, watery eyes.  ENT: Denies discharge, congestion, post nasal drip, epistaxis, sore throat, earache, hearing loss, dental pain, tinnitus, vertigo, sinus pain, snoring.  CV: Denies chest pain, palpitations, irregular heartbeat, syncope, dyspnea, diaphoresis, orthopnea, PND, claudication or edema. Respiratory: denies cough, dyspnea, DOE, pleurisy, hoarseness, laryngitis, wheezing.  Gastrointestinal: Denies dysphagia, odynophagia, heartburn, reflux, water  brash, abdominal pain or cramps, nausea, vomiting, bloating, diarrhea, constipation, hematemesis, melena, hematochezia  or hemorrhoids. Genitourinary: Denies dysuria, frequency, urgency, nocturia, hesitancy, discharge, hematuria or flank pain. Musculoskeletal: Denies arthralgias, myalgias, stiffness, jt. swelling, pain, limping or strain/sprain.  Skin: Denies pruritus, rash, hives, warts, acne, eczema or change in skin lesion(s). Neuro: No weakness, tremor, incoordination, spasms,  paresthesia or pain. Psychiatric: Denies confusion, memory loss or sensory loss. Endo: Denies change in weight, skin or hair change.  Heme/Lymph: No excessive bleeding, bruising or enlarged lymph nodes.  Physical Exam  BP 112/60   Pulse 80   Temp (!) 97.3 F (36.3 C)   Resp 16   Ht 5\' 3"  (1.6 m)   Wt 133 lb (60.3 kg)   BMI 23.56 kg/m   Appears  well nourished, well groomed  and in no distress.  Eyes: PERRLA, EOMs, conjunctiva no swelling or erythema. Sinuses: No frontal/maxillary tenderness ENT/Mouth: EAC's clear, TM's nl w/o erythema, bulging. Nares clear w/o erythema, swelling, exudates. Oropharynx clear without erythema or exudates. Oral hygiene is good. Tongue normal, non obstructing. Hearing intact.  Neck: Supple. Thyroid not palpable. Car 2+/2+ without bruits, nodes or JVD. Chest: Respirations nl with BS clear & equal w/o rales, rhonchi, wheezing or stridor.  Cor: Heart sounds normal w/ regular rate and rhythm without sig. murmurs, gallops, clicks or rubs. Peripheral pulses normal and equal  without edema.  Abdomen: Soft & bowel sounds normal. Non-tender w/o guarding, rebound, hernias, masses or organomegaly.  Lymphatics: Unremarkable.  Musculoskeletal: Full ROM all peripheral extremities, joint stability, 5/5 strength and normal gait.  Skin: Warm, dry without exposed rashes, lesions or ecchymosis apparent.  Neuro: Cranial nerves intact, reflexes equal bilaterally. Sensory-motor testing grossly intact. Tendon reflexes grossly intact.  Pysch: Alert & oriented x 3.  Insight and judgement nl & appropriate. No ideations.  Assessment and Plan:  1. Labile hypertension  - Continue medication, monitor blood pressure at home.  - Continue DASH diet. Reminder to go to the ER if any CP,  SOB, nausea, dizziness, severe HA, changes vision/speech.  - CBC with Differential/Platelet - BASIC METABOLIC PANEL WITH GFR - Magnesium - TSH  2. Hyperlipidemia, mixed  - Continue  diet/meds, exercise,& lifestyle modifications.  - Continue monitor periodic cholesterol/liver & renal functions   - Hepatic function panel - Hemoglobin A1c  3. Abnormal glucose  - Continue diet, exercise, lifestyle modifications.  - Monitor appropriate labs.  - Hemoglobin A1c - Insulin, random  4. Vitamin D deficiency  - Continue supplementation.  - VITAMIN D 25 Hydroxy   5. Medication management  - CBC with Differential/Platelet - BASIC METABOLIC PANEL WITH GFR - Hepatic function panel - Magnesium - Lipid panel - TSH - Hemoglobin A1c - Insulin, random - VITAMIN D 25 Hydroxy        Discussed  regular exercise, BP monitoring, weight control to achieve/maintain BMI less than 25 and discussed med and SE's. Recommended labs to assess and monitor clinical status with further disposition pending results of labs. Over 30 minutes of exam, counseling, chart review was performed.

## 2017-03-27 NOTE — Patient Instructions (Signed)

## 2017-03-28 LAB — HEPATIC FUNCTION PANEL
AG RATIO: 1.9 (calc) (ref 1.0–2.5)
ALBUMIN MSPROF: 4 g/dL (ref 3.6–5.1)
ALT: 14 U/L (ref 6–29)
AST: 18 U/L (ref 10–35)
Alkaline phosphatase (APISO): 55 U/L (ref 33–130)
BILIRUBIN DIRECT: 0.1 mg/dL (ref 0.0–0.2)
BILIRUBIN INDIRECT: 0.5 mg/dL (ref 0.2–1.2)
GLOBULIN: 2.1 g/dL (ref 1.9–3.7)
Total Bilirubin: 0.6 mg/dL (ref 0.2–1.2)
Total Protein: 6.1 g/dL (ref 6.1–8.1)

## 2017-03-28 LAB — BASIC METABOLIC PANEL WITH GFR
BUN: 22 mg/dL (ref 7–25)
CALCIUM: 9.6 mg/dL (ref 8.6–10.4)
CO2: 27 mmol/L (ref 20–32)
CREATININE: 0.63 mg/dL (ref 0.60–0.88)
Chloride: 106 mmol/L (ref 98–110)
GFR, EST AFRICAN AMERICAN: 93 mL/min/{1.73_m2} (ref >=60)
GFR, EST NON AFRICAN AMERICAN: 81 mL/min/{1.73_m2} (ref >=60)
Glucose, Bld: 125 mg/dL — ABNORMAL HIGH (ref 65–99)
Potassium: 4 mmol/L (ref 3.5–5.3)
Sodium: 141 mmol/L (ref 135–146)

## 2017-03-28 LAB — HEMOGLOBIN A1C
HEMOGLOBIN A1C: 5.6 %{Hb} (ref <5.7)
MEAN PLASMA GLUCOSE: 114 (calc)
eAG (mmol/L): 6.3 (calc)

## 2017-03-28 LAB — LIPID PANEL
Cholesterol: 218 mg/dL — ABNORMAL HIGH (ref <200)
HDL: 69 mg/dL (ref >50)
LDL CHOLESTEROL (CALC): 127 mg/dL — AB
Non-HDL Cholesterol (Calc): 149 mg/dL (calc) — ABNORMAL HIGH (ref <130)
TRIGLYCERIDES: 111 mg/dL (ref <150)
Total CHOL/HDL Ratio: 3.2 (calc) (ref <5.0)

## 2017-03-28 LAB — CBC WITH DIFFERENTIAL/PLATELET
BASOS PCT: 0.8 %
Basophils Absolute: 62 cells/uL (ref 0–200)
EOS ABS: 109 {cells}/uL (ref 15–500)
Eosinophils Relative: 1.4 %
HEMATOCRIT: 39 % (ref 35.0–45.0)
HEMOGLOBIN: 12.9 g/dL (ref 11.7–15.5)
LYMPHS ABS: 1326 {cells}/uL (ref 850–3900)
MCH: 30.9 pg (ref 27.0–33.0)
MCHC: 33.1 g/dL (ref 32.0–36.0)
MCV: 93.5 fL (ref 80.0–100.0)
MPV: 11.1 fL (ref 7.5–12.5)
Monocytes Relative: 7.2 %
NEUTROS ABS: 5741 {cells}/uL (ref 1500–7800)
Neutrophils Relative %: 73.6 %
Platelets: 252 10*3/uL (ref 140–400)
RBC: 4.17 10*6/uL (ref 3.80–5.10)
RDW: 12 % (ref 11.0–15.0)
TOTAL LYMPHOCYTE: 17 %
WBC: 7.8 10*3/uL (ref 3.8–10.8)
WBCMIX: 562 {cells}/uL (ref 200–950)

## 2017-03-28 LAB — VITAMIN D 25 HYDROXY (VIT D DEFICIENCY, FRACTURES): Vit D, 25-Hydroxy: 54 ng/mL (ref 30–100)

## 2017-03-28 LAB — TSH: TSH: 1.72 mIU/L (ref 0.40–4.50)

## 2017-03-28 LAB — MAGNESIUM: MAGNESIUM: 1.9 mg/dL (ref 1.5–2.5)

## 2017-03-28 LAB — INSULIN, RANDOM: Insulin: 26.3 u[IU]/mL — ABNORMAL HIGH (ref 2.0–19.6)

## 2017-03-30 ENCOUNTER — Encounter: Payer: Self-pay | Admitting: Internal Medicine

## 2017-05-08 ENCOUNTER — Other Ambulatory Visit: Payer: Self-pay | Admitting: Internal Medicine

## 2017-05-08 DIAGNOSIS — Z1231 Encounter for screening mammogram for malignant neoplasm of breast: Secondary | ICD-10-CM

## 2017-05-14 ENCOUNTER — Ambulatory Visit (INDEPENDENT_AMBULATORY_CARE_PROVIDER_SITE_OTHER): Payer: Self-pay | Admitting: Orthopaedic Surgery

## 2017-05-16 ENCOUNTER — Encounter: Payer: Self-pay | Admitting: Physician Assistant

## 2017-05-16 ENCOUNTER — Ambulatory Visit: Payer: Medicare Other | Admitting: Physician Assistant

## 2017-05-16 ENCOUNTER — Telehealth: Payer: Self-pay | Admitting: Physician Assistant

## 2017-05-16 VITALS — BP 120/60 | HR 69 | Temp 97.5°F | Resp 14 | Ht 63.0 in | Wt 132.1 lb

## 2017-05-16 DIAGNOSIS — H9203 Otalgia, bilateral: Secondary | ICD-10-CM | POA: Diagnosis not present

## 2017-05-16 DIAGNOSIS — H6123 Impacted cerumen, bilateral: Secondary | ICD-10-CM

## 2017-05-16 NOTE — Telephone Encounter (Signed)
OPENED IN ERROR

## 2017-05-16 NOTE — Patient Instructions (Signed)
Aleve, ibuprofen is an antiinflammatory You can take tylenol (500mg ) or tylenol arthritis (650mg ) with the meloxicam/antiinflammatories. The max you can take of tylenol a day is 3000mg  daily, this is a max of 6 pills a day of the regular tyelnol (500mg ) or a max of 4 a day of the tylenol arthritis (650mg ) as long as no other medications you are taking contain tylenol.   this can cause inflammation in your stomach and can cause ulcers or bleeding, this will look like black tarry stools Make sure you taking it with food Try not to take it daily, take AS needed Can take with zantac  Continue to go to the [facility] and get in the pool  Use a dropper or use a cap to put peroxide, olive oil,mineral oil or canola oil in the effected ear- 2-3 times a week. Let it soak for 20-30 min then you can take a shower or use a baby bulb with warm water to wash out the ear wax.  Do not use Qtips

## 2017-05-16 NOTE — Progress Notes (Signed)
   Subjective:    Patient ID: Victoria Long, female    DOB: [birth year], 84 y.o.   MRN: [id number]  HPI 84 y.o. WF presents with right ear fullness/decreased hearing x 1 weeks. She has been putting heating pad on it and oil in it without help. She has no pain in her ear, but has some pain in her neck occ. No fever or chills. She has not had any allergies but she is coughing in the office/clearing her throat.   Blood pressure 120/60, pulse 69, temperature (!) 97.5 F (36.4 C), resp. rate 14, height 5\' 3"  (1.6 m), weight 132 lb 1.6 oz (59.9 kg), SpO2 97 %.  Medications Current Outpatient Medications on File Prior to Visit  Medication Sig  . aspirin 81 MG tablet Take 81 mg by mouth daily.  . calcium carbonate (OS-CAL) 600 MG TABS Take 600 mg by mouth 2 (two) times daily with a meal.   . Cholecalciferol (VITAMIN D) 1000 UNITS capsule Take 1,000 Units by mouth 2 (two) times daily.   Marland Kitchen LYSINE HCL PO Take 500 mg by mouth daily.   . Multiple Vitamins-Minerals (MULTIVITAMIN PO) Take by mouth daily.  . Omega-3 Fatty Acids (FISH OIL PO) Take by mouth daily. Mega Red   No current facility-administered medications on file prior to visit.     Problem list She has Hyperlipidemia; Prediabetes; Vitamin D deficiency; Encounter for long-term (current) use of medications; History of right breast cancer; Pancreatic mass; Lumbar verterbral fracture, traumatic; and Essential hypertension on their problem list.   Review of Systems SEE HPI    Objective:   Physical Exam  Constitutional: She appears well-developed and well-nourished.  HENT:  Head: Normocephalic and atraumatic.  Bilateral ears with decreased hearing and cerumen impaction, cleaned out in the office, TM is pearly and normal canal, hearing improved.   Eyes: Pupils are equal, round, and reactive to light. Conjunctivae are normal.  Neck: Normal range of motion. Neck supple.  Lymphadenopathy:    She has no cervical adenopathy.        Assessment & Plan:  Kambry was seen today for acute visit.  Diagnoses and all orders for this visit:  Excessive cerumen in ear canal, bilateral Acute ear pain, bilateral - stop using Qtips, irrigation used in the office without complications, use OTC drops/oil at home to prevent reoccurence

## 2017-06-23 ENCOUNTER — Ambulatory Visit
Admission: RE | Admit: 2017-06-23 | Discharge: 2017-06-23 | Disposition: A | Discharge status: Home / Self Care | Payer: Medicare Other | Source: Ambulatory Visit | Attending: Internal Medicine | Admitting: Internal Medicine

## 2017-06-23 DIAGNOSIS — Z1231 Encounter for screening mammogram for malignant neoplasm of breast: Secondary | ICD-10-CM

## 2017-06-24 ENCOUNTER — Other Ambulatory Visit: Payer: Self-pay | Admitting: Internal Medicine

## 2017-06-24 DIAGNOSIS — R928 Other abnormal and inconclusive findings on diagnostic imaging of breast: Secondary | ICD-10-CM

## 2017-06-27 ENCOUNTER — Ambulatory Visit
Admission: RE | Admit: 2017-06-27 | Discharge: 2017-06-27 | Disposition: A | Discharge status: Home / Self Care | Payer: Medicare Other | Source: Ambulatory Visit | Attending: Internal Medicine | Admitting: Internal Medicine

## 2017-06-27 ENCOUNTER — Other Ambulatory Visit: Payer: Self-pay | Admitting: Internal Medicine

## 2017-06-27 DIAGNOSIS — N6320 Unspecified lump in the left breast, unspecified quadrant: Secondary | ICD-10-CM

## 2017-06-27 DIAGNOSIS — R928 Other abnormal and inconclusive findings on diagnostic imaging of breast: Secondary | ICD-10-CM

## 2017-06-30 ENCOUNTER — Other Ambulatory Visit: Payer: Self-pay | Admitting: Internal Medicine

## 2017-06-30 DIAGNOSIS — N6320 Unspecified lump in the left breast, unspecified quadrant: Secondary | ICD-10-CM

## 2017-07-01 ENCOUNTER — Ambulatory Visit
Admission: RE | Admit: 2017-07-01 | Discharge: 2017-07-01 | Disposition: A | Discharge status: Home / Self Care | Payer: Medicare Other | Source: Ambulatory Visit | Attending: Internal Medicine | Admitting: Internal Medicine

## 2017-07-01 DIAGNOSIS — N6320 Unspecified lump in the left breast, unspecified quadrant: Secondary | ICD-10-CM

## 2017-07-08 ENCOUNTER — Other Ambulatory Visit: Payer: Self-pay | Admitting: Surgery

## 2017-07-09 ENCOUNTER — Other Ambulatory Visit: Payer: Self-pay | Admitting: Surgery

## 2017-07-10 ENCOUNTER — Encounter: Payer: Self-pay | Admitting: Radiation Oncology

## 2017-07-10 ENCOUNTER — Encounter: Payer: Self-pay | Admitting: Oncology

## 2017-07-10 ENCOUNTER — Telehealth: Payer: Self-pay | Admitting: Oncology

## 2017-07-10 NOTE — Telephone Encounter (Signed)
New referral received from Dr. [name] at [facility] for new dx of breast cancer. Pt has been scheduled to see Dr. Jana Hakim on 7/3 at 4pm. Pt is a former patient of Dr. Jana Hakim who says she was last seen in 2012. Letter with the appointment information has been sent to the pt.

## 2017-07-14 NOTE — Progress Notes (Signed)
Location of Breast Cancer: Invasive Ductal Carcinoma Left breast  Did patient present with symptoms (if so, please note symptoms) or was this found on screening mammography?: Patient had abnormal mammogram screening 03/23/2017 that showed a mass in her left breast.  Measures 1.6 cm in size.  Mammogram 06/27/2017: There is a suspicious mass in the left breast at 1 o'clock measuring 1.6 cm.  No evidence of left axillary lymphadenopathy.   Histology per Pathology Report:  Left Breast 07/01/17  Receptor Status: ER(+ 100%), PR (+ 60%), Her2-neu (-), Ki-67(50%)    Past/Anticipated interventions by surgeon, if any: Dr. [name] 07/08/2017 - Currently she is a candidate for breast conservation as with her previous right breast cancer.   -We discussed a left breast partial mastectomy. -We will discuss her case in multidisciplinary conference tomorrow, we will determine whether or not she needs a sentinel lymph node biopsy and radiation at her age. -I will call her back after the discussion and then we can plan for surgery.   -Referred to radiation oncology, for evaluation and follow-up.  Surgery scheduled for 07/29/2017: Left Breast Partial Mastectomy  Past/Anticipated interventions by medical oncology, if any: Chemotherapy  Scheduled to see Dr. Jana Hakim on 07/23/2017 4pm  Lymphedema issues, if any:  None noted  Pain issues, if any: None noted  BP (!) 146/66 (BP Location: Right Arm, Patient Position: Sitting, Cuff Size: Normal)   Pulse 64   Temp 98 F (36.7 C) (Oral)   Resp 20   Ht _0  (1.575 m)   Wt 130 lb 9.6 oz (59.2 kg)   SpO2 100%   BMI 23.89 kg/m    Wt Readings from Last 3 Encounters:  07/15/17 130 lb 9.6 oz (59.2 kg)  05/16/17 132 lb 1.6 oz (59.9 kg)  03/27/17 133 lb (60.3 kg)    SAFETY ISSUES:  Prior radiation? 2012 Right Breast: Whole breast to a dose of 42.5 Gy at 2.5 Gy per fraction, 10 Gy boost to the tumor cavity, with Dr. Lisbeth Renshaw  Pacemaker/ICD? No  Possible current  pregnancy? No  Is the patient on methotrexate? No  Current Complaints / other details:      Cori Razor, RN 07/14/2017,2:43 PM

## 2017-07-15 ENCOUNTER — Ambulatory Visit
Admission: RE | Admit: 2017-07-15 | Discharge: 2017-07-15 | Disposition: A | Discharge status: Home / Self Care | Payer: Medicare Other | Source: Ambulatory Visit | Attending: Radiation Oncology | Admitting: Radiation Oncology

## 2017-07-15 ENCOUNTER — Encounter: Payer: Self-pay | Admitting: Radiation Oncology

## 2017-07-15 ENCOUNTER — Other Ambulatory Visit: Payer: Self-pay

## 2017-07-15 VITALS — BP 146/66 | HR 64 | Temp 98.0°F | Resp 20 | Ht 62.0 in | Wt 130.6 lb

## 2017-07-15 DIAGNOSIS — E559 Vitamin D deficiency, unspecified: Secondary | ICD-10-CM | POA: Insufficient documentation

## 2017-07-15 DIAGNOSIS — C50912 Malignant neoplasm of unspecified site of left female breast: Secondary | ICD-10-CM

## 2017-07-15 DIAGNOSIS — Z170 Estrogen receptor positive status [ER+]: Secondary | ICD-10-CM | POA: Diagnosis not present

## 2017-07-15 DIAGNOSIS — Z9889 Other specified postprocedural states: Secondary | ICD-10-CM | POA: Diagnosis not present

## 2017-07-15 DIAGNOSIS — Z853 Personal history of malignant neoplasm of breast: Secondary | ICD-10-CM | POA: Diagnosis present

## 2017-07-15 DIAGNOSIS — Z87891 Personal history of nicotine dependence: Secondary | ICD-10-CM | POA: Diagnosis not present

## 2017-07-15 DIAGNOSIS — E785 Hyperlipidemia, unspecified: Secondary | ICD-10-CM | POA: Diagnosis not present

## 2017-07-15 DIAGNOSIS — R7303 Prediabetes: Secondary | ICD-10-CM | POA: Insufficient documentation

## 2017-07-15 DIAGNOSIS — M1990 Unspecified osteoarthritis, unspecified site: Secondary | ICD-10-CM | POA: Diagnosis not present

## 2017-07-15 DIAGNOSIS — Z888 Allergy status to other drugs, medicaments and biological substances status: Secondary | ICD-10-CM | POA: Insufficient documentation

## 2017-07-15 DIAGNOSIS — N6321 Unspecified lump in the left breast, upper outer quadrant: Secondary | ICD-10-CM | POA: Diagnosis not present

## 2017-07-15 DIAGNOSIS — Z79899 Other long term (current) drug therapy: Secondary | ICD-10-CM | POA: Diagnosis not present

## 2017-07-15 NOTE — Progress Notes (Addendum)
Radiation Oncology         [phone] ________________________________  Name: Victoria Long        MRN: [id number]  Date of Service: 07/15/2017 DOB: 1928/03/28  CC:Unk Pinto, MD  [name], MD     REFERRING PHYSICIAN: [name], MD     ATTESTATION Please see the note from Shona Simpson, PA-C from today's visit for more details of today's encounter.  I have personally performed a face to face diagnostic evaluation on this patient and devised the following assessment and plan.  The patient was seen today regarding her new diagnosis of left-sided breast cancer.  The patient has a history of right-sided breast cancer and completed a hypo-fractionated course of radiation treatment to the right breast in 2012 in our department.  She has been found to have a 1.6 cm tumor with no apparent lymphadenopathy.  This corresponds to a grade 1 invasive ductal carcinoma.  She appears to be a good candidate for breast conservation treatment and is scheduled to see medical oncology next week.  I discussed possible options with the patient.  She did not receive anti-hormonal treatment previously and I am not sure what her thoughts will be this time.  We discussed that she does not need to make a decision in terms of radiation treatment today.  Her health is quite good for her age and she did very well with radiation treatment previously.  We discussed seeing what her final results of surgery are, seeing what her thoughts are after seeing medical oncology, and then reevaluating postoperatively.  I think that she would be able to tolerate a 4-week course of radiation treatment if this is ultimately her choice but she may also have the option of avoiding such a treatment as well.  I look forward to seeing her after surgery.   Kyung Rudd, MD     DIAGNOSIS: The encounter diagnosis was Invasive ductal carcinoma of breast, female, left ([place]).   HISTORY OF PRESENT ILLNESS: Victoria Long is  a 84 y.o. female seen at the request of Dr. Jana Hakim for a history of Stage IA, pT1bN0M0 grade 1 ER/PR positive invasive ductal carcinoma of the right breast s/p lumpectomy and radiotherapy who was released from medical oncology care in 2016. She did not take antiestrogen therapy. She was found on a recent mammogram on the left breast to have a mass in the upper outer quadrant. On ultrasound this measured 1.6 x 1 x 1.2 cm, and her axilla was negative for adenopathy. A biopsy on 07/01/17 revealed a grade 1 ER/PR positive, HER2 negative invasive ductal carcinoma, and her Ki 67 was 50%. She is scheduled for lumpectomy on 07/29/17, to see Dr. Jana Hakim on 07/23/17, and comes today to discuss options of adjuvant therapy for her left breast.    PREVIOUS RADIATION THERAPY: Yes   08/13/10-09/10/10:  42.5 Gy to the right breast and 10 Gy boost to the lumpectomy cavity to total 52.5 Gy with Dr. Lisbeth Renshaw   PAST MEDICAL HISTORY:  Past Medical History:  Diagnosis Date  . Arthritis    false + RF, negative anticcp  . Cancer [facility])    right breast cancer  . Hyperlipidemia   . Melanoma ([place]) 2014   right humerus  . Personal history of radiation therapy 2012  . Prediabetes   . Vitamin D deficiency        PAST SURGICAL HISTORY: Past Surgical History:  Procedure Laterality Date  . BREAST BIOPSY Right 05/28/2010  . BREAST  LUMPECTOMY Right 06/29/2010   with radiation no chemo  . CATARACT EXTRACTION Bilateral 2009  . CESAREAN SECTION    . MELANOMA EXCISION Right 2014   humerus     FAMILY HISTORY:  Family History  Problem Relation Age of Onset  . Arthritis Mother   . Rheum arthritis Mother   . Hypertension Sister   . Cancer Sister        type unknown  . Diabetes Maternal Grandmother      SOCIAL HISTORY:  reports that she quit smoking about 40 years ago. She quit after 10.00 years of use. She has never used smokeless tobacco. She reports that she drinks about 2.4 oz of alcohol per week. She reports  that she does not use drugs.   ALLERGIES: Tramadol   MEDICATIONS:  Current Outpatient Medications  Medication Sig Dispense Refill  . calcium carbonate (OS-CAL) 600 MG TABS Take 600 mg by mouth 2 (two) times daily with a meal.     . Cholecalciferol (VITAMIN D) 1000 UNITS capsule Take 1,000 Units by mouth 2 (two) times daily.     Marland Kitchen LYSINE HCL PO Take 500 mg by mouth daily.     . Multiple Vitamins-Minerals (MULTIVITAMIN PO) Take by mouth daily.    . Omega-3 Fatty Acids (FISH OIL PO) Take by mouth daily. Mega Red     No current facility-administered medications for this encounter.      REVIEW OF SYSTEMS: On review of systems, the patient reports that she is doing well overall. She did fall a few days ago and has bruised her cheeks and around her eyes. She denies any chest pain, shortness of breath, cough, fevers, chills, night sweats, unintended weight changes. She denies any bowel or bladder disturbances, and denies abdominal pain, nausea or vomiting. She denies any new musculoskeletal or joint aches or pains. A complete review of systems is obtained and is otherwise negative.     PHYSICAL EXAM:  Wt Readings from Last 3 Encounters:  07/15/17 130 lb 9.6 oz (59.2 kg)  05/16/17 132 lb 1.6 oz (59.9 kg)  03/27/17 133 lb (60.3 kg)   Temp Readings from Last 3 Encounters:  07/15/17 98 F (36.7 C) (Oral)  05/16/17 (!) 97.5 F (36.4 C)  03/27/17 (!) 97.3 F (36.3 C)   BP Readings from Last 3 Encounters:  07/15/17 (!) 146/66  05/16/17 120/60  03/27/17 112/60   Pulse Readings from Last 3 Encounters:  07/15/17 64  05/16/17 69  03/27/17 80   Pain Assessment Pain Score: 0-No pain/10  In general this is a well appearing caucasian female in no acute distress. She is alert and oriented x4 and appropriate throughout the examination. HEENT reveals that the patient is normocephalic, atraumatic. EOMs are intact. Her skin has ecchymoses along her eyes and cheeks bilaterally without  laceration. Skin is otherwise intact without any evidence of gross lesions. Cardiovascular exam reveals a regular rate and rhythm, no clicks rubs or murmurs are auscultated. Chest is clear to auscultation bilaterally. Lymphatic assessment is performed and does not reveal any adenopathy in the cervical, supraclavicular, axillary, or inguinal chains. Abdomen has active bowel sounds in all quadrants and is intact. The abdomen is soft, non tender, non distended. Lower extremities are negative for pretibial pitting edema, deep calf tenderness, cyanosis or clubbing.   ECOG = 90  0 - Asymptomatic (Fully active, able to carry on all predisease activities without restriction)  1 - Symptomatic but completely ambulatory (Restricted in physically strenuous activity but ambulatory  and able to carry out work of a light or sedentary nature. For example, light housework, office work)  2 - Symptomatic, <50% in bed during the day (Ambulatory and capable of all self care but unable to carry out any work activities. Up and about more than 50% of waking hours)  3 - Symptomatic, >50% in bed, but not bedbound (Capable of only limited self-care, confined to bed or chair 50% or more of waking hours)  4 - Bedbound (Completely disabled. Cannot carry on any self-care. Totally confined to bed or chair)  5 - Death   Eustace Pen MM, Creech RH, Tormey DC, et al. [phone]). "Toxicity and response criteria of the [facility]". [place] Oncol. 5 (6): 649-55    LABORATORY DATA:  Lab Results  Component Value Date   WBC 7.8 03/27/2017   HGB 12.9 03/27/2017   HCT 39.0 03/27/2017   MCV 93.5 03/27/2017   PLT 252 03/27/2017   Lab Results  Component Value Date   NA 141 03/27/2017   K 4.0 03/27/2017   CL 106 03/27/2017   CO2 27 03/27/2017   Lab Results  Component Value Date   ALT 14 03/27/2017   AST 18 03/27/2017   ALKPHOS 56 07/25/2016   BILITOT 0.6 03/27/2017      RADIOGRAPHY: US Breast Ltd Uni  Left Inc Axilla  Result Date: 06/27/2017 CLINICAL DATA:  Screening recall for a possible left breast mass. The patient has history of a right breast cancer in 2012. EXAM: DIGITAL DIAGNOSTIC UNILATERAL LEFT MAMMOGRAM WITH CAD AND TOMO LEFT BREAST ULTRASOUND COMPARISON:  Previous exam(s). ACR Breast Density Category b: There are scattered areas of fibroglandular density. FINDINGS: Spot compression tomosynthesis images of the area in question demonstrates a persistent irregular mass in the upper-outer quadrant of the left breast. Mammographic images were processed with CAD. Physical exam of the upper-outer left breast demonstrates a small palpable mass at approximately 1 o'clock. Ultrasound of the left breast at 1 o'clock, 4 cm from the nipple demonstrates an oval hypoechoic indistinct vascular mass measuring 1.6 x 1.0 x 1.2 cm. Ultrasound of the left axilla demonstrates normal lymph nodes. IMPRESSION: 1. There is a suspicious mass in the left breast at 1 o'clock measuring 1.6 cm. 2.  No evidence of left axillary lymphadenopathy. RECOMMENDATION: Ultrasound guided biopsy is recommended for the left breast mass. This has been scheduled for 07/01/2017 at 3:45 p.m. I have discussed the findings and recommendations with the patient. Results were also provided in writing at the conclusion of the visit. If applicable, a reminder letter will be sent to the patient regarding the next appointment. BI-RADS CATEGORY  5: Highly suggestive of malignancy. Electronically Signed   By: Ammie Ferrier M.D.   On: 06/27/2017 14:17   Mm Diag Breast Tomo Uni Left  Result Date: 06/27/2017 CLINICAL DATA:  Screening recall for a possible left breast mass. The patient has history of a right breast cancer in 2012. EXAM: DIGITAL DIAGNOSTIC UNILATERAL LEFT MAMMOGRAM WITH CAD AND TOMO LEFT BREAST ULTRASOUND COMPARISON:  Previous exam(s). ACR Breast Density Category b: There are scattered areas of fibroglandular density. FINDINGS: Spot  compression tomosynthesis images of the area in question demonstrates a persistent irregular mass in the upper-outer quadrant of the left breast. Mammographic images were processed with CAD. Physical exam of the upper-outer left breast demonstrates a small palpable mass at approximately 1 o'clock. Ultrasound of the left breast at 1 o'clock, 4 cm from the nipple demonstrates an oval hypoechoic  indistinct vascular mass measuring 1.6 x 1.0 x 1.2 cm. Ultrasound of the left axilla demonstrates normal lymph nodes. IMPRESSION: 1. There is a suspicious mass in the left breast at 1 o'clock measuring 1.6 cm. 2.  No evidence of left axillary lymphadenopathy. RECOMMENDATION: Ultrasound guided biopsy is recommended for the left breast mass. This has been scheduled for 07/01/2017 at 3:45 p.m. I have discussed the findings and recommendations with the patient. Results were also provided in writing at the conclusion of the visit. If applicable, a reminder letter will be sent to the patient regarding the next appointment. BI-RADS CATEGORY  5: Highly suggestive of malignancy. Electronically Signed   By: Ammie Ferrier M.D.   On: 06/27/2017 14:17   Mm 3d Screen Breast Bilateral  Result Date: 06/23/2017 CLINICAL DATA:  Screening. EXAM: DIGITAL SCREENING BILATERAL MAMMOGRAM WITH TOMO AND CAD COMPARISON:  Previous exam(s). ACR Breast Density Category b: There are scattered areas of fibroglandular density. FINDINGS: In the left breast, a possible mass warrants further evaluation. In the right breast, no findings suspicious for malignancy. Images were processed with CAD. IMPRESSION: Further evaluation is suggested for possible mass in the left breast. RECOMMENDATION: Diagnostic mammogram and possibly ultrasound of the left breast. (Code:[id number]) The patient will be contacted regarding the findings, and additional imaging will be scheduled. BI-RADS CATEGORY  0: Incomplete. Need additional imaging evaluation and/or prior mammograms  for comparison. Electronically Signed   By: Abelardo Diesel M.D.   On: 06/23/2017 16:24   Mm Clip Placement Left  Result Date: 07/01/2017 CLINICAL DATA:  Post biopsy mammogram of the left breast for clip placement. EXAM: DIAGNOSTIC LEFT MAMMOGRAM POST ULTRASOUND BIOPSY COMPARISON:  Previous exam(s). FINDINGS: Mammographic images were obtained following ultrasound guided biopsy of a mass in the left breast at 1 o'clock. The ribbon shaped biopsy clip is well positioned at the site of biopsy in the upper-outer left breast. IMPRESSION: Appropriate positioning of the ribbon shaped biopsy marking clip in the upper-outer left breast. Final Assessment: Post Procedure Mammograms for Marker Placement Electronically Signed   By: Ammie Ferrier M.D.   On: 07/01/2017 16:18   [name] Lt Breast Bx W Loc Dev 1st Lesion Img Bx Spec US Guide  Addendum Date: 07/02/2017   ADDENDUM REPORT: 07/02/2017 10:49 ADDENDUM: Pathology revealed GRADE I INVASIVE DUCTAL CARCINOMA of the Left breast, 1 o'clock. This was found to be concordant by Dr. Ammie Ferrier. Pathology results were discussed with the patient by telephone. The patient reported doing well after the biopsy with tenderness at the site. Post biopsy instructions and care were reviewed and questions were answered. The patient was encouraged to call [facility] for any additional concerns. Surgical consultation has been arranged with Dr. Nedra Hai, per patient request, at [facility] on July 08, 2017. Pathology results reported by Terie Purser, RN on 07/02/2017. Electronically Signed   By: Ammie Ferrier M.D.   On: 07/02/2017 10:49   Result Date: 07/02/2017 CLINICAL DATA:  84 year old female presenting for ultrasound-guided biopsy of a left breast mass. EXAM: ULTRASOUND GUIDED LEFT BREAST CORE NEEDLE BIOPSY COMPARISON:  Previous exam(s). FINDINGS: I met with the patient and we discussed the procedure of ultrasound-guided biopsy,  including benefits and alternatives. We discussed the high likelihood of a successful procedure. We discussed the risks of the procedure, including infection, bleeding, tissue injury, clip migration, and inadequate sampling. Informed written consent was given. The usual time-out protocol was performed immediately prior to the procedure. Lesion quadrant: Upper-outer quadrant  Using sterile technique and 1% Lidocaine as local anesthetic, under direct ultrasound visualization, a 14 gauge spring-loaded device was used to perform biopsy of a mass in the left breast at 1 o'clock using a lateral approach. At the conclusion of the procedure a ribbon shaped tissue marker clip was deployed into the biopsy cavity. Follow up 2 view mammogram was performed and dictated separately. IMPRESSION: Ultrasound guided biopsy of left breast mass at 1 o'clock. No apparent complications. Electronically Signed: By: Ammie Ferrier M.D. On: 07/01/2017 16:13       IMPRESSION/PLAN: 1. Stage IA, cT1cN0M0 grade 1 ER/PR positive invasive ductal carcinoma of the left breast. Dr. Lisbeth Renshaw discusses the pathology findings and reviews the nature of left breast disease. The consensus from the breast conference includes breast conservation with lumpectomy with  sentinel node biopsy. Depending on the size of the final tumor measurements rendered by pathology, the tumor may be tested for Oncotype Dx score to determine a role for systemic therapy. Dr. Jana Hakim will outline this specifically. Provided that chemotherapy is not indicated, the patient's course would then be followed by external radiotherapy to the breast followed by antiestrogen therapy. We discussed the risks, benefits, short, and long term effects of radiotherapy, and the patient is interested in proceeding. Dr. Lisbeth Renshaw discusses the delivery and logistics of radiotherapy and anticipates a course of 4 or 6 1/2 weeks of radiotherapy with deep inspiration breath hold technique. We will see  her back about 2 weeks after surgery to discuss the simulation process and anticipate we starting radiotherapy about 4-6 weeks after surgery. 2. Stage IA, cT1bN0M0 grade 1 ER/PR positive invasive ductal carcinoma of the right breast. She will continue with surveillance when undergoing mammography for the left breast and will follow with Dr. Jana Hakim as well.  3. Recent fall and ecchymoses. The patient is feeling well without concerns for progressive pain. We will follow this expectantly.   In a visit lasting 60 minutes, greater than 50% of the time was spent face to face discussing her case, and coordinating the patient's care.   The above documentation reflects my direct findings during this shared patient visit. Please see the separate note by Dr. Lisbeth Renshaw on this date for the remainder of the patient's plan of care.    Carola Rhine, PAC

## 2017-07-21 ENCOUNTER — Other Ambulatory Visit: Payer: Self-pay

## 2017-07-21 ENCOUNTER — Encounter (HOSPITAL_BASED_OUTPATIENT_CLINIC_OR_DEPARTMENT_OTHER): Payer: Self-pay | Admitting: *Deleted

## 2017-07-22 ENCOUNTER — Other Ambulatory Visit: Payer: Self-pay

## 2017-07-22 DIAGNOSIS — C50912 Malignant neoplasm of unspecified site of left female breast: Secondary | ICD-10-CM

## 2017-07-22 NOTE — Progress Notes (Signed)
Ensure given with instructions to complete by [place], surgical soap given with instructions, pt verbalized understanding.

## 2017-07-22 NOTE — Progress Notes (Signed)
[place]  Telephone:([phone]) [phone] Fax:(336) [phone]     ID: Victoria Long DOB: 05-Apr-1928  MR#: [id number]  EUM#:[id number]  Patient Care Team: Unk Pinto, MD as PCP - General (Internal Medicine) [name], MD as Consulting Physician (Gastroenterology) [name], MD as Referring Physician (Orthopedic Surgery) [name], MD as Consulting Physician (Dermatology) Magrinat, [name], MD as Consulting Physician (Oncology) Griselda Miner, MD as Consulting Physician (Dermatology) [name], MD as Consulting Physician (General Surgery) [name], MD OTHER MD:  CHIEF COMPLAINT: Estrogen receptor positive breast cancer  CURRENT TREATMENT: Awaiting definitive surgery   HISTORY OF CURRENT ILLNESS: Victoria Long had routine screening mammography on 06/23/2017 showing a possible abnormality in the left breast. She underwent unilateral left diagnostic mammography with tomography and left breast ultrasonography at [facility] on 06/27/2017 showing: breast density category B. There was a mass at 1 o'clock in the left breast upper outer quadrant measuring 1.6 x 1.0 x 1.2 cm and located 4 cm from the nipple. There was no evidence of left axillary lymphadenopathy.  Accordingly on 07/01/2017 she proceeded to biopsy of the left breast area in question. The pathology from this procedure showed ([id number]): Invasive ductal carcinoma grade I. Prognostic indicators significant for: estrogen receptor, 100% positive and progesterone receptor, 60% positive, both with strong staining intensity. Proliferation marker Ki67 at 50% . HER2 not amplified with ratios HER2/CEP17 signals 1.43 and average HER2 copies per cell 3.30  Note that she also has a history of right-sided breast cancer status post right lumpectomy and sentinel lymph node sampling 06/29/2010, for a 0.6 cm invasive ductal carcinoma, grade 1, present at the anterior margin, with both sentinel  lymph nodes negative.  The tumor was estrogen and progesterone receptor strongly positive, HER-2 not amplified, with an MIB-104, and was treated adjuvantly with radiation alone.  The patient's subsequent history is as detailed below.  INTERVAL HISTORY: Victoria Long was evaluated in the breast cancer clinic on 07/22/2017 accompanied by her daughter-in-law.   REVIEW OF SYSTEMS: There were no specific symptoms leading to the original mammogram, which was routinely scheduled. Last week, she fell at the steps of her home and bruised her face. [place] 2018 due to dehydration. She walks with a cane. The patient denies unusual headaches, visual changes, nausea, vomiting, stiff neck, dizziness, or gait imbalance. There has been no cough, phlegm production, or pleurisy, no chest pain or pressure, and no change in bowel or bladder habits. The patient denies fever, rash, bleeding, unexplained fatigue or unexplained weight loss. A detailed review of systems was otherwise entirely negative.    PAST MEDICAL HISTORY: Past Medical History:  Diagnosis Date  . Arthritis    knees  . Breast cancer ([place]) 06/2017   left breast  . Cancer ([place])    right breast cancer  . Hyperlipidemia   . Melanoma ([place]) 2014   right humerus  . Personal history of radiation therapy 2012  . Prediabetes   . Vitamin D deficiency     PAST SURGICAL HISTORY: Past Surgical History:  Procedure Laterality Date  . BREAST BIOPSY Right 05/28/2010  . BREAST LUMPECTOMY Right 06/29/2010   with radiation no chemo  . CATARACT EXTRACTION Bilateral 2009  . CESAREAN SECTION    . MELANOMA EXCISION Right 2014   humerus  Lipoma removed from right arm   FAMILY HISTORY Family History  Problem Relation Age of Onset  . Arthritis Mother   .  Rheum arthritis Mother   . Hypertension Sister   . Cancer Sister        type unknown  . Diabetes Maternal Grandmother   The patient's mother died at age 19 due to RA. The  patient's father died due to heart issues. The patient had 1 brother and 2 sisters. She denies a family history of breast or ovarian cancer.  GYNECOLOGIC HISTORY:  No LMP recorded. Patient is postmenopausal. Menarche: 84 years old Age at first live birth: 84 years old She is GXP4. Her LMP was in her early 50's. She did not use HRT or oral contraception.    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 She is widowed twice over. At home is just herself with no pets. The patient's oldest son, Jenny Reichmann "Elta Guadeloupe" Diachenko lives in [place] as a Forensic scientist. The patient's second is son, Gershon Mussel who lives in [place] as a Development worker, international aid. The patient's third child is Juanda Crumble "Harrie Jeans" who works in Press photographer in [place]. The patient's youngest child is son, Herbie Baltimore "Mikki Santee" who lives in [place] in business. The patient has 10 grandchildren- who are all college graduates. She also has 6 great-grandchildren. The patient belongs to [employer] in [place].     ADVANCED DIRECTIVES: Tanique intends to name her son, Jenny Reichmann "Elta Guadeloupe" as her HCPOA   HEALTH MAINTENANCE: Social History   Tobacco Use  . Smoking status: Former Smoker    Years: 10.00    Last attempt to quit: 08/21/1976    Years since quitting: 40.9  . Smokeless tobacco: Never Used  Substance Use Topics  . Alcohol use: Yes    Alcohol/week: 2.4 oz    Types: 4 Standard drinks or equivalent per week    Comment: wine  . Drug use: No     Colonoscopy: UTD  PAP:  Bone density: recent/ normal    Allergies  Allergen Reactions  . Tramadol     Medication caused dizziness    Current Outpatient Medications  Medication Sig Dispense Refill  . acetaminophen (TYLENOL) 500 MG tablet Take 1,000 mg by mouth every 8 (eight) hours as needed.    . calcium carbonate (OS-CAL) 600 MG TABS Take 600 mg by mouth 2 (two) times daily with a meal.     . Cholecalciferol (VITAMIN D) 1000 UNITS capsule Take 1,000  Units by mouth 2 (two) times daily.     Marland Kitchen LYSINE HCL PO Take 500 mg by mouth daily.     . Multiple Vitamins-Minerals (MULTIVITAMIN PO) Take by mouth daily.    . Omega-3 Fatty Acids (FISH OIL PO) Take by mouth daily. Mega Red     No current facility-administered medications for this visit.     OBJECTIVE: Older white woman in no acute distress  Vitals:   07/23/17 1530  BP: 136/65  Pulse: 70  Resp: 17  Temp: 98.1 F (36.7 C)  SpO2: 96%     Body mass index is 23.74 kg/m.   Wt Readings from Last 3 Encounters:  07/23/17 129 lb 12.8 oz (58.9 kg)  07/15/17 130 lb 9.6 oz (59.2 kg)  05/16/17 132 lb 1.6 oz (59.9 kg)      ECOG FS:1 - Symptomatic but completely ambulatory  Ocular: Sclerae unicteric, pupils round and equal Ear-nose-throat: Oropharynx clear and moist Lymphatic: No cervical or supraclavicular adenopathy Lungs no rales or rhonchi Heart regular rate and rhythm Abd soft, nontender, positive bowel sounds MSK no focal spinal tenderness, no joint  edema Neuro: non-focal, well-oriented, appropriate affect Breasts: Right breast is status post remote lumpectomy and radiation.  There is no evidence of local recurrence.  The left breast is status post recent biopsy.  I do not palpate a well-defined mass.  There are no skin or nipple changes of concern.  Both axillae are benign.   LAB RESULTS:  CMP     Component Value Date/Time   NA 141 07/23/2017 1510   NA 144 10/24/2014 1426   K 4.4 07/23/2017 1510   K 4.1 10/24/2014 1426   CL 106 07/23/2017 1510   CL 107 06/08/2012 0851   CO2 29 07/23/2017 1510   CO2 29 10/24/2014 1426   GLUCOSE 95 07/23/2017 1510   GLUCOSE 114 10/24/2014 1426   GLUCOSE 107 (H) 06/08/2012 0851   BUN 19 07/23/2017 1510   BUN 13.1 10/24/2014 1426   CREATININE 0.71 07/23/2017 1510   CREATININE 0.63 03/27/2017 1250   CREATININE 0.7 10/24/2014 1426   CALCIUM 9.9 07/23/2017 1510   CALCIUM 9.3 10/24/2014 1426   PROT 6.7 07/23/2017 1510   PROT 6.1 (L)  10/24/2014 1426   ALBUMIN 4.2 07/23/2017 1510   ALBUMIN 3.5 10/24/2014 1426   AST 19 07/23/2017 1510   AST 18 10/24/2014 1426   ALT 18 07/23/2017 1510   ALT 16 10/24/2014 1426   ALKPHOS 60 07/23/2017 1510   ALKPHOS 54 10/24/2014 1426   BILITOT 0.3 07/23/2017 1510   BILITOT 0.43 10/24/2014 1426   GFRNONAA >60 07/23/2017 1510   GFRNONAA 81 03/27/2017 1250   GFRAA >60 07/23/2017 1510   GFRAA 93 03/27/2017 1250    No results found for: TOTALPROTELP, ALBUMINELP, A1GS, A2GS, BETS, BETA2SER, GAMS, MSPIKE, SPEI  No results found for: KPAFRELGTCHN, LAMBDASER, KAPLAMBRATIO  Lab Results  Component Value Date   WBC 5.8 07/23/2017   NEUTROABS 3.2 07/23/2017   HGB 12.7 07/23/2017   HCT 38.8 07/23/2017   MCV 97.0 07/23/2017   PLT 227 07/23/2017    @LASTCHEMISTRY @  Lab Results  Component Value Date   LABCA2 37 12/10/2011    No components found for: BTDVVO160  No results for input(s): INR in the last 168 hours.  Lab Results  Component Value Date   LABCA2 37 12/10/2011    No results found for: VPX106  No results found for: YIR485  No results found for: IOE703  No results found for: CA2729  No components found for: HGQUANT  No results found for: CEA1 / No results found for: CEA1   No results found for: AFPTUMOR  No results found for: [place]  No results found for: PSA1  Appointment on 07/23/2017  Component Date Value Ref Range Status  . Sodium 07/23/2017 141  135 - 145 mmol/L Final   Please note reference intervals were recently updated.  . Potassium 07/23/2017 4.4  3.5 - 5.1 mmol/L Final  . Chloride 07/23/2017 106  98 - 111 mmol/L Final  . CO2 07/23/2017 29  22 - 32 mmol/L Final  . Glucose, Bld 07/23/2017 95  70 - 99 mg/dL Final  . BUN 07/23/2017 19  8 - 23 mg/dL Final   Please note change in reference range.  . Creatinine 07/23/2017 0.71  0.44 - 1.00 mg/dL Final  . Calcium 07/23/2017 9.9  8.9 - 10.3 mg/dL Final  . Total Protein 07/23/2017 6.7  6.5 - 8.1  g/dL Final  . Albumin 07/23/2017 4.2  3.5 - 5.0 g/dL Final  . AST 07/23/2017 19  15 - 41 U/L Final  .  ALT 07/23/2017 18  0 - 44 U/L Final  . Alkaline Phosphatase 07/23/2017 60  38 - 126 U/L Final  . Total Bilirubin 07/23/2017 0.3  0.3 - 1.2 mg/dL Final  . GFR, Est Non Af Am 07/23/2017 >60  >60 mL/min Final  . GFR, Est AFR Am 07/23/2017 >60  >60 mL/min Final   Comment: (NOTE) The eGFR has been calculated using the CKD EPI equation. This calculation has not been validated in all clinical situations. eGFR's persistently <60 mL/min signify possible Chronic Kidney Disease.   Georgiann Hahn gap 07/23/2017 6  5 - 15 Final   Performed at [facility], [place] [address], [place], [place] [address]  . WBC Count 07/23/2017 5.8  3.9 - 10.3 K/uL Final  . RBC 07/23/2017 4.00  3.70 - 5.45 MIL/uL Final  . Hemoglobin 07/23/2017 12.7  11.6 - 15.9 g/dL Final  . HCT 07/23/2017 38.8  34.8 - 46.6 % Final  . MCV 07/23/2017 97.0  79.5 - 101.0 fL Final  . MCH 07/23/2017 31.8  25.1 - 34.0 pg Final  . MCHC 07/23/2017 32.7  31.5 - 36.0 g/dL Final  . RDW 07/23/2017 13.6  11.2 - 14.5 % Final  . Platelet Count 07/23/2017 227  145 - 400 K/uL Final  . Neutrophils Relative % 07/23/2017 55  % Final  . Neutro Abs 07/23/2017 3.2  1.5 - 6.5 K/uL Final  . Lymphocytes Relative 07/23/2017 34  % Final  . Lymphs Abs 07/23/2017 2.0  0.9 - 3.3 K/uL Final  . Monocytes Relative 07/23/2017 8  % Final  . Monocytes Absolute 07/23/2017 0.5  0.1 - 0.9 K/uL Final  . Eosinophils Relative 07/23/2017 2  % Final  . Eosinophils Absolute 07/23/2017 0.1  0.0 - 0.5 K/uL Final  . Basophils Relative 07/23/2017 1  % Final  . Basophils Absolute 07/23/2017 0.0  0.0 - 0.1 K/uL Final   Performed at [facility], [place] [address], [place], [place] [address]    (this displays the last labs from the last 3 days)  No results found for: TOTALPROTELP, ALBUMINELP, A1GS, A2GS, BETS, BETA2SER, GAMS, MSPIKE,  SPEI (this displays SPEP labs)  No results found for: KPAFRELGTCHN, LAMBDASER, KAPLAMBRATIO (kappa/lambda light chains)  No results found for: HGBA, HGBA2QUANT, HGBFQUANT, HGBSQUAN (Hemoglobinopathy evaluation)   No results found for: LDH  Lab Results  Component Value Date   IRON 64 08/09/2014   TIBC 336 08/09/2014   IRONPCTSAT 19 (L) 08/09/2014   (Iron and TIBC)  No results found for: FERRITIN  Urinalysis    Component Value Date/Time   COLORURINE YELLOW 08/22/2016 0935   APPEARANCEUR CLEAR 08/22/2016 0935   LABSPEC 1.013 08/22/2016 0935   PHURINE 6.5 08/22/2016 0935   GLUCOSEU NEGATIVE 08/22/2016 0935   HGBUR NEGATIVE 08/22/2016 0935   BILIRUBINUR NEGATIVE 08/22/2016 0935   KETONESUR NEGATIVE 08/22/2016 0935   PROTEINUR NEGATIVE 08/22/2016 0935   UROBILINOGEN 0.2 08/09/2014 1505   NITRITE NEGATIVE 08/22/2016 0935   LEUKOCYTESUR NEGATIVE 08/22/2016 0935     STUDIES: US Breast Ltd Uni Left Inc Axilla  Result Date: 06/27/2017 CLINICAL DATA:  Screening recall for a possible left breast mass. The patient has history of a right breast cancer in 2012. EXAM: DIGITAL DIAGNOSTIC UNILATERAL LEFT MAMMOGRAM WITH CAD AND TOMO LEFT BREAST ULTRASOUND COMPARISON:  Previous exam(s). ACR Breast Density Category b: There are scattered areas of fibroglandular density. FINDINGS: Spot compression tomosynthesis images of the area in question demonstrates a persistent irregular mass in  the upper-outer quadrant of the left breast. Mammographic images were processed with CAD. Physical exam of the upper-outer left breast demonstrates a small palpable mass at approximately 1 o'clock. Ultrasound of the left breast at 1 o'clock, 4 cm from the nipple demonstrates an oval hypoechoic indistinct vascular mass measuring 1.6 x 1.0 x 1.2 cm. Ultrasound of the left axilla demonstrates normal lymph nodes. IMPRESSION: 1. There is a suspicious mass in the left breast at 1 o'clock measuring 1.6 cm. 2.  No evidence of  left axillary lymphadenopathy. RECOMMENDATION: Ultrasound guided biopsy is recommended for the left breast mass. This has been scheduled for 07/01/2017 at 3:45 p.m. I have discussed the findings and recommendations with the patient. Results were also provided in writing at the conclusion of the visit. If applicable, a reminder letter will be sent to the patient regarding the next appointment. BI-RADS CATEGORY  5: Highly suggestive of malignancy. Electronically Signed   By: Ammie Ferrier M.D.   On: 06/27/2017 14:17   Mm Diag Breast Tomo Uni Left  Result Date: 06/27/2017 CLINICAL DATA:  Screening recall for a possible left breast mass. The patient has history of a right breast cancer in 2012. EXAM: DIGITAL DIAGNOSTIC UNILATERAL LEFT MAMMOGRAM WITH CAD AND TOMO LEFT BREAST ULTRASOUND COMPARISON:  Previous exam(s). ACR Breast Density Category b: There are scattered areas of fibroglandular density. FINDINGS: Spot compression tomosynthesis images of the area in question demonstrates a persistent irregular mass in the upper-outer quadrant of the left breast. Mammographic images were processed with CAD. Physical exam of the upper-outer left breast demonstrates a small palpable mass at approximately 1 o'clock. Ultrasound of the left breast at 1 o'clock, 4 cm from the nipple demonstrates an oval hypoechoic indistinct vascular mass measuring 1.6 x 1.0 x 1.2 cm. Ultrasound of the left axilla demonstrates normal lymph nodes. IMPRESSION: 1. There is a suspicious mass in the left breast at 1 o'clock measuring 1.6 cm. 2.  No evidence of left axillary lymphadenopathy. RECOMMENDATION: Ultrasound guided biopsy is recommended for the left breast mass. This has been scheduled for 07/01/2017 at 3:45 p.m. I have discussed the findings and recommendations with the patient. Results were also provided in writing at the conclusion of the visit. If applicable, a reminder letter will be sent to the patient regarding the next appointment.  BI-RADS CATEGORY  5: Highly suggestive of malignancy. Electronically Signed   By: Ammie Ferrier M.D.   On: 06/27/2017 14:17   Mm Clip Placement Left  Result Date: 07/01/2017 CLINICAL DATA:  Post biopsy mammogram of the left breast for clip placement. EXAM: DIAGNOSTIC LEFT MAMMOGRAM POST ULTRASOUND BIOPSY COMPARISON:  Previous exam(s). FINDINGS: Mammographic images were obtained following ultrasound guided biopsy of a mass in the left breast at 1 o'clock. The ribbon shaped biopsy clip is well positioned at the site of biopsy in the upper-outer left breast. IMPRESSION: Appropriate positioning of the ribbon shaped biopsy marking clip in the upper-outer left breast. Final Assessment: Post Procedure Mammograms for Marker Placement Electronically Signed   By: Ammie Ferrier M.D.   On: 07/01/2017 16:18   [name] Lt Breast Bx W Loc Dev 1st Lesion Img Bx Spec US Guide  Addendum Date: 07/02/2017   ADDENDUM REPORT: 07/02/2017 10:49 ADDENDUM: Pathology revealed GRADE I INVASIVE DUCTAL CARCINOMA of the Left breast, 1 o'clock. This was found to be concordant by Dr. Ammie Ferrier. Pathology results were discussed with the patient by telephone. The patient reported doing well after the biopsy with tenderness at the site. Post  biopsy instructions and care were reviewed and questions were answered. The patient was encouraged to call [facility] for any additional concerns. Surgical consultation has been arranged with Dr. Nedra Hai, per patient request, at [facility] on July 08, 2017. Pathology results reported by Terie Purser, RN on 07/02/2017. Electronically Signed   By: Ammie Ferrier M.D.   On: 07/02/2017 10:49   Result Date: 07/02/2017 CLINICAL DATA:  84 year old female presenting for ultrasound-guided biopsy of a left breast mass. EXAM: ULTRASOUND GUIDED LEFT BREAST CORE NEEDLE BIOPSY COMPARISON:  Previous exam(s). FINDINGS: I met with the patient and we discussed  the procedure of ultrasound-guided biopsy, including benefits and alternatives. We discussed the high likelihood of a successful procedure. We discussed the risks of the procedure, including infection, bleeding, tissue injury, clip migration, and inadequate sampling. Informed written consent was given. The usual time-out protocol was performed immediately prior to the procedure. Lesion quadrant: Upper-outer quadrant Using sterile technique and 1% Lidocaine as local anesthetic, under direct ultrasound visualization, a 14 gauge spring-loaded device was used to perform biopsy of a mass in the left breast at 1 o'clock using a lateral approach. At the conclusion of the procedure a ribbon shaped tissue marker clip was deployed into the biopsy cavity. Follow up 2 view mammogram was performed and dictated separately. IMPRESSION: Ultrasound guided biopsy of left breast mass at 1 o'clock. No apparent complications. Electronically Signed: By: Ammie Ferrier M.D. On: 07/01/2017 16:13    ELIGIBLE FOR AVAILABLE RESEARCH PROTOCOL: no  ASSESSMENT: 84 y.o. [place], [place] woman with a prior history of right-sided breast cancer, now status post left breast upper outer quadrant biopsy 07/01/2017 for a clinical T1c N0, stage IA invasive ductal carcinoma, grade 1, estrogen and progesterone receptor positive, HER-2 not amplified, with an MIB-1 of 50%  (1) status post right lumpectomy June of 2012 for a T1b N0 grade 1 invasive ductal carcinoma, with abundant extracellular mucin, and no lymph node involvement (T1b, N0, or stage I).    The tumor was estrogen and progesterone receptor strongly positive, with no HER-2 amplification and then MIB-104%  (a) completed adjuvant radiation August 2012  (b) no other adjuvant chemotherapy  (c) two right axillary lymph nodes were removed  (2) definitive surgery scheduled for 07/29/2017  (3) consider adjuvant radiation depending on surgical results  (4) antiestrogens to  follow    PLAN: We spent the better part of today's hour-long appointment discussing the biology of her diagnosis and the specifics of her situation. We first reviewed the fact that cancer is not one disease but more than 100 different diseases and that it is important to keep them separate-- otherwise when friends and relatives discuss their own cancer experiences with Britanni confusion can result. Similarly we explained that if breast cancer spreads to the bone or liver, the patient would not have bone cancer or liver cancer, but breast cancer in the bone and breast cancer in the liver: one cancer in three places-- not 3 different cancers which otherwise would have to be treated in 3 different ways.  We discussed the difference between local and systemic therapy. In terms of loco-regional treatment, lumpectomy plus radiation is equivalent to mastectomy as far as survival is concerned. For this reason, and because the cosmetic results are generally superior, we recommend breast conserving surgery.   We then discussed the rationale for systemic therapy. There is some risk that this cancer may have already spread to other parts of her body.  Patients frequently ask at this point about bone scans, CAT scans and PET scans to find out if they have occult breast cancer somewhere else. The problem is that in early stage disease we are much more likely to find false positives then true cancers and this would expose the patient to unnecessary procedures as well as unnecessary radiation. Scans cannot answer the question the patient really would like to know, which is whether she has microscopic disease elsewhere in her body. For those reasons we do not recommend them.  Of course we would proceed to aggressive evaluation of any symptoms that might suggest metastatic disease, but that is not the case here.  Next we went over the options for systemic therapy which are anti-estrogens, anti-HER-2 immunotherapy, and  chemotherapy. Zaakirah does not meet criteria for anti-HER-2 immunotherapy. She is a good candidate for anti-estrogens.  The question of chemotherapy is more complicated. Chemotherapy is most effective in rapidly growing, aggressive tumors. It is much less effective in low-grade cancers, like Embry 's. For that reason and given her age and comorbidities we are going to bypass the chemotherapy option and not send an Oncotype at this time.  The overall plan then is for surgery, then depending on surgical results consider radiation, and finally antiestrogens.  I urged Agueda note only to use her cane at all times but also to use a walker most of the time since she is at very high risk of falling.  I let her know I think her chance of falling is more dangerous to her than this breast cancer  Kanyon has a good understanding of the overall plan. She agrees with it. She knows the goal of treatment in her case is cure. She will call with any problems that may develop before her next visit here.   Magrinat, [name], MD  07/23/17 4:30 PM Medical Oncology and Hematology [facility] [address] [place], [place] [address] Tel. [phone]    Fax. [phone]  Alice Rieger, am acting as scribe for [name] MD.  I, Lurline Del MD, have reviewed the above documentation for accuracy and completeness, and I agree with the above.

## 2017-07-23 ENCOUNTER — Inpatient Hospital Stay: Payer: Medicare Other

## 2017-07-23 ENCOUNTER — Inpatient Hospital Stay: Payer: Medicare Other | Attending: Oncology | Admitting: Oncology

## 2017-07-23 DIAGNOSIS — C50912 Malignant neoplasm of unspecified site of left female breast: Secondary | ICD-10-CM

## 2017-07-23 DIAGNOSIS — Z853 Personal history of malignant neoplasm of breast: Secondary | ICD-10-CM | POA: Insufficient documentation

## 2017-07-23 DIAGNOSIS — Z170 Estrogen receptor positive status [ER+]: Secondary | ICD-10-CM | POA: Insufficient documentation

## 2017-07-23 DIAGNOSIS — C50412 Malignant neoplasm of upper-outer quadrant of left female breast: Secondary | ICD-10-CM | POA: Diagnosis not present

## 2017-07-23 DIAGNOSIS — D0361 Melanoma in situ of right upper limb, including shoulder: Secondary | ICD-10-CM | POA: Insufficient documentation

## 2017-07-23 DIAGNOSIS — Z9181 History of falling: Secondary | ICD-10-CM | POA: Insufficient documentation

## 2017-07-23 LAB — CMP (CANCER CENTER ONLY)
ALK PHOS: 60 U/L (ref 38–126)
ALT: 18 U/L (ref 0–44)
ANION GAP: 6 (ref 5–15)
AST: 19 U/L (ref 15–41)
Albumin: 4.2 g/dL (ref 3.5–5.0)
BILIRUBIN TOTAL: 0.3 mg/dL (ref 0.3–1.2)
BUN: 19 mg/dL (ref 8–23)
CALCIUM: 9.9 mg/dL (ref 8.9–10.3)
CO2: 29 mmol/L (ref 22–32)
Chloride: 106 mmol/L (ref 98–111)
Creatinine: 0.71 mg/dL (ref 0.44–1.00)
GLUCOSE: 95 mg/dL (ref 70–99)
POTASSIUM: 4.4 mmol/L (ref 3.5–5.1)
Sodium: 141 mmol/L (ref 135–145)
TOTAL PROTEIN: 6.7 g/dL (ref 6.5–8.1)

## 2017-07-23 LAB — CBC WITH DIFFERENTIAL (CANCER CENTER ONLY)
BASOS ABS: 0 10*3/uL (ref 0.0–0.1)
BASOS PCT: 1 %
Eosinophils Absolute: 0.1 10*3/uL (ref 0.0–0.5)
Eosinophils Relative: 2 %
HEMATOCRIT: 38.8 % (ref 34.8–46.6)
Hemoglobin: 12.7 g/dL (ref 11.6–15.9)
LYMPHS PCT: 34 %
Lymphs Abs: 2 10*3/uL (ref 0.9–3.3)
MCH: 31.8 pg (ref 25.1–34.0)
MCHC: 32.7 g/dL (ref 31.5–36.0)
MCV: 97 fL (ref 79.5–101.0)
MONO ABS: 0.5 10*3/uL (ref 0.1–0.9)
Monocytes Relative: 8 %
NEUTROS ABS: 3.2 10*3/uL (ref 1.5–6.5)
NEUTROS PCT: 55 %
Platelet Count: 227 10*3/uL (ref 145–400)
RBC: 4 MIL/uL (ref 3.70–5.45)
RDW: 13.6 % (ref 11.2–14.5)
WBC: 5.8 10*3/uL (ref 3.9–10.3)

## 2017-07-23 NOTE — Addendum Note (Signed)
Encounter addended by: Kyung Rudd, MD on: 07/23/2017 4:43 PM  Actions taken: Sign clinical note

## 2017-07-25 ENCOUNTER — Telehealth: Payer: Self-pay | Admitting: Oncology

## 2017-07-25 NOTE — Telephone Encounter (Signed)
Per 7/3 no los °

## 2017-07-28 NOTE — H&P (Signed)
Victoria Long Documented: 07/08/2017 10:02 AM Location: [facility] Patient #: [id number] DOB: 1928/05/09 Widowed / Language: Victoria Long / Race: White Female   History of Present Illness ([name] MD; 07/08/2017 10:19 AM) The patient is a 84 year old female who presents with breast cancer. This patient is here today for evaluation of a newly diagnosed left breast cancer. She has a history of a right breast cancer status post right breast lumpectomy and follow-up radiation therapy in 2012. The cancer was ER and PR positive but she refused and hormonal therapy. She is down now to have a left breast mass on screening mammography. It measures 1.6 cm in size. It is ER/PR positive, HER-2 negative. Ki-67 was 50%. She is otherwise doing well and is without complaints. She denies nipple discharge.   Past Surgical History Malachi Bonds, CMA; 07/08/2017 10:21 AM) Appendectomy  Breast Biopsy  Bilateral. Breast Mass; Local Excision  Right. Cesarean Section - Multiple   Diagnostic Studies History Malachi Bonds, CMA; 07/08/2017 10:21 AM) Colonoscopy  >10 years ago Mammogram  within last year Pap Smear  >5 years ago  Allergies (April Staton, CMA; 07/08/2017 10:03 AM) TraMADol HCl *CHEMICALS*   Medication History (April Staton, CMA; 07/08/2017 10:04 AM) Multi-Vitamin (Oral) Active. Medications Reconciled  Social History Malachi Bonds, CMA; 07/08/2017 10:21 AM) Alcohol use  Occasional alcohol use. Caffeine use  Carbonated beverages, Coffee, Tea. No drug use  Tobacco use  Former smoker.  Family History Malachi Bonds, CMA; 07/08/2017 10:21 AM) Arthritis  Mother.  Pregnancy / Birth History Malachi Bonds, CMA; 07/08/2017 10:21 AM) Age at menarche  6 years. Age of menopause  <47 Gravida  5 Length (months) of breastfeeding  3-6 Maternal age  19-25 Para  70  Other Problems Malachi Bonds, CMA; 07/08/2017 10:21 AM) Breast Cancer  Lump In Breast   Melanoma     Review of Systems (Chemira Jones CMA; 07/08/2017 10:21 AM) General Not Present- Appetite Loss, Chills, Fatigue, Fever, Night Sweats, Weight Gain and Weight Loss. Skin Not Present- Change in Wart/Mole, Dryness, Hives, Jaundice, New Lesions, Non-Healing Wounds, Rash and Ulcer. HEENT Present- Wears glasses/contact lenses. Not Present- Earache, Hearing Loss, Hoarseness, Nose Bleed, Oral Ulcers, Ringing in the Ears, Seasonal Allergies, Sinus Pain, Sore Throat, Visual Disturbances and Yellow Eyes. Respiratory Not Present- Bloody sputum, Chronic Cough, Difficulty Breathing, Snoring and Wheezing. Breast Present- Breast Mass. Not Present- Breast Pain, Nipple Discharge and Skin Changes. Cardiovascular Not Present- Chest Pain, Difficulty Breathing Lying Down, Leg Cramps, Palpitations, Rapid Heart Rate, Shortness of Breath and Swelling of Extremities. Gastrointestinal Not Present- Abdominal Pain, Bloating, Bloody Stool, Change in Bowel Habits, Chronic diarrhea, Constipation, Difficulty Swallowing, Excessive gas, Gets full quickly at meals, Hemorrhoids, Indigestion, Nausea, Rectal Pain and Vomiting. Female Genitourinary Not Present- Frequency, Nocturia, Painful Urination, Pelvic Pain and Urgency. Musculoskeletal Present- Swelling of Extremities. Not Present- Back Pain, Joint Pain, Joint Stiffness, Muscle Pain and Muscle Weakness. Neurological Present- Trouble walking. Not Present- Decreased Memory, Fainting, Headaches, Numbness, Seizures, Tingling, Tremor and Weakness. Psychiatric Not Present- Anxiety, Bipolar, Change in Sleep Pattern, Depression, Fearful and Frequent crying. Endocrine Not Present- Cold Intolerance, Excessive Hunger, Hair Changes, Heat Intolerance, Hot flashes and New Diabetes. Hematology Present- Easy Bruising. Not Present- Blood Thinners, Excessive bleeding, Gland problems, HIV and Persistent Infections.  Vitals (April Staton CMA; 07/08/2017 10:04 AM) 07/08/2017 10:04  AM Weight: 131 lb Height: 62in Body Surface Area: 1.6 m Body Mass Index: 23.96 kg/m  Temp.: 97.84F(Oral)  Pulse: 71 (Regular)  P.OX: 97% (Room air)  BP: 122/80 (Sitting, Left Arm, Standard)       Physical Exam (Leya Paige A. [name] MD; 07/08/2017 10:20 AM) General Mental Status-Alert. General Appearance-Consistent with stated age. Hydration-Well hydrated. Voice-Normal.  Head and Neck Head-normocephalic, atraumatic with no lesions or palpable masses. Trachea-midline. Thyroid Gland Characteristics - normal size and consistency.  Eye Eyeball - Bilateral-Extraocular movements intact. Sclera/Conjunctiva - Bilateral-No scleral icterus.  Chest and Lung Exam Chest and lung exam reveals -quiet, even and easy respiratory effort with no use of accessory muscles and on auscultation, normal breath sounds, no adventitious sounds and normal vocal resonance. Inspection Chest Wall - Normal. Back - normal.  Breast Breast - Left-Symmetric, Non Tender, No Biopsy scars, no Dimpling, No Inflammation, No Lumpectomy scars, No Mastectomy scars, No Peau d' Orange. Note: There is a 1 1/2 cmmass in the upper-outer quadrant left breast at the 1 o'clock position. It is mobile. There are no skin changes and no axillary adenopathy. There are no other masses in either breast. Breast - Right-Symmetric, Non Tender, No Biopsy scars, no Dimpling, No Inflammation, No Lumpectomy scars, No Mastectomy scars, No Peau d' Orange.  Cardiovascular Cardiovascular examination reveals -normal heart sounds, regular rate and rhythm with no murmurs and normal pedal pulses bilaterally.  Abdomen Inspection Inspection of the abdomen reveals - No Hernias. Skin - Scar - no surgical scars. Palpation/Percussion Palpation and Percussion of the abdomen reveal - Soft, Non Tender, No Rebound tenderness, No Rigidity (guarding) and No hepatosplenomegaly. Auscultation Auscultation of the abdomen  reveals - Bowel sounds normal.  Neurologic Neurologic evaluation reveals -alert and oriented x 3 with no impairment of recent or remote memory. Mental Status-Normal.  Musculoskeletal - Did not examine.  Lymphatic Head & Neck  General Head & Neck Lymphatics: Bilateral - Description - Normal. Axillary  General Axillary Region: Bilateral - Description - Normal. Tenderness - Non Tender. Femoral & Inguinal - Did not examine.    Assessment & Plan (Sharde Gover A. [name] MD; 07/08/2017 10:21 AM) BREAST CANCER, LEFT (C50.912) Impression: I discussed the diagnosis with the patient and her son. I gave them a copy of the pathology report which I also reviewed. I reviewed her mammograms and ultrasound as well. Currently, she is a candidate for breast conservation as with her previous right breast cancer. We discussed a left breast partial mastectomy. We will be discussing her case in our multidisciplinary conference tomorrow. We will determine whether or not she needs a sentinel lymph node biopsy and radiation her age. I will call her back after the discussion and then we can plan for surgery. I did discuss the surgical procedure. I discussed the risk which include but is not limited to bleeding, infection, injury to surrounding structures, the need for further procedures, the need for further surgery margins are positive, cardiopulmonary issues, postoperative recovery, etc. She understands and wishes to proceed  After discussion, the plan is to proceed with a left breast partial mastectomy and forgo node biopsy.

## 2017-07-28 NOTE — Anesthesia Preprocedure Evaluation (Signed)
Anesthesia Evaluation  Patient identified by MRN, date of birth, ID band Patient awake    Reviewed: Allergy & Precautions, H&P , NPO status , Patient's Chart, lab work & pertinent test results, reviewed documented beta blocker date and time   Airway Mallampati: II  TM Distance: >3 FB Neck ROM: full    Dental no notable dental hx.    Pulmonary neg pulmonary ROS, former smoker,    Pulmonary exam normal breath sounds clear to auscultation       Cardiovascular Exercise Tolerance: Good hypertension, negative cardio ROS   Rhythm:regular Rate:Normal     Neuro/Psych negative neurological ROS  negative psych ROS   GI/Hepatic   Endo/Other    Renal/GU   negative genitourinary   Musculoskeletal  (+) Arthritis , Osteoarthritis,    Abdominal   Peds  Hematology   Anesthesia Other Findings   Reproductive/Obstetrics negative OB ROS                             Anesthesia Physical Anesthesia Plan  ASA: III  Anesthesia Plan: General   Post-op Pain Management:    Induction:   PONV Risk Score and Plan: 3 and Ondansetron, Dexamethasone and Treatment may vary due to age or medical condition  Airway Management Planned: LMA and Oral ETT  Additional Equipment:   Intra-op Plan:   Post-operative Plan:   Informed Consent: I have reviewed the patients History and Physical, chart, labs and discussed the procedure including the risks, benefits and alternatives for the proposed anesthesia with the patient or authorized representative who has indicated his/her understanding and acceptance.   Dental Advisory Given  Plan Discussed with: CRNA, Anesthesiologist and Surgeon  Anesthesia Plan Comments:         Anesthesia Quick Evaluation

## 2017-07-29 ENCOUNTER — Encounter (HOSPITAL_BASED_OUTPATIENT_CLINIC_OR_DEPARTMENT_OTHER)
Admission: RE | Disposition: A | Discharge status: Home / Self Care | Payer: Self-pay | Source: Ambulatory Visit | Attending: Surgery

## 2017-07-29 ENCOUNTER — Encounter (HOSPITAL_BASED_OUTPATIENT_CLINIC_OR_DEPARTMENT_OTHER): Payer: Self-pay | Admitting: Emergency Medicine

## 2017-07-29 ENCOUNTER — Ambulatory Visit (HOSPITAL_BASED_OUTPATIENT_CLINIC_OR_DEPARTMENT_OTHER)
Admission: RE | Admit: 2017-07-29 | Discharge: 2017-07-29 | Disposition: A | Discharge status: Home / Self Care | Payer: Medicare Other | Source: Ambulatory Visit | Attending: Surgery | Admitting: Surgery

## 2017-07-29 ENCOUNTER — Ambulatory Visit (HOSPITAL_BASED_OUTPATIENT_CLINIC_OR_DEPARTMENT_OTHER): Payer: Medicare Other | Admitting: Anesthesiology

## 2017-07-29 ENCOUNTER — Other Ambulatory Visit: Payer: Self-pay

## 2017-07-29 DIAGNOSIS — Z923 Personal history of irradiation: Secondary | ICD-10-CM | POA: Insufficient documentation

## 2017-07-29 DIAGNOSIS — I10 Essential (primary) hypertension: Secondary | ICD-10-CM | POA: Insufficient documentation

## 2017-07-29 DIAGNOSIS — M1990 Unspecified osteoarthritis, unspecified site: Secondary | ICD-10-CM | POA: Insufficient documentation

## 2017-07-29 DIAGNOSIS — Z885 Allergy status to narcotic agent status: Secondary | ICD-10-CM | POA: Diagnosis not present

## 2017-07-29 DIAGNOSIS — Z79899 Other long term (current) drug therapy: Secondary | ICD-10-CM | POA: Diagnosis not present

## 2017-07-29 DIAGNOSIS — Z170 Estrogen receptor positive status [ER+]: Secondary | ICD-10-CM | POA: Insufficient documentation

## 2017-07-29 DIAGNOSIS — Z853 Personal history of malignant neoplasm of breast: Secondary | ICD-10-CM | POA: Insufficient documentation

## 2017-07-29 DIAGNOSIS — C50412 Malignant neoplasm of upper-outer quadrant of left female breast: Secondary | ICD-10-CM | POA: Diagnosis not present

## 2017-07-29 DIAGNOSIS — Z87891 Personal history of nicotine dependence: Secondary | ICD-10-CM | POA: Insufficient documentation

## 2017-07-29 HISTORY — PX: MASTECTOMY, PARTIAL: SHX709

## 2017-07-29 SURGERY — MASTECTOMY PARTIAL
Anesthesia: General | Site: Breast | Laterality: Left

## 2017-07-29 MED ORDER — ACETAMINOPHEN 500 MG PO TABS
ORAL_TABLET | ORAL | Status: AC
  Filled 2017-07-29: qty 2

## 2017-07-29 MED ORDER — PHENYLEPHRINE 40 MCG/ML (10ML) SYRINGE FOR IV PUSH (FOR BLOOD PRESSURE SUPPORT)
PREFILLED_SYRINGE | INTRAVENOUS | Status: DC | PRN
  Administered 2017-07-29: 40 ug via INTRAVENOUS

## 2017-07-29 MED ORDER — LACTATED RINGERS IV SOLN
INTRAVENOUS | Status: DC
  Administered 2017-07-29: 08:00:00 via INTRAVENOUS

## 2017-07-29 MED ORDER — SCOPOLAMINE 1 MG/3DAYS TD PT72: 1.0000 | MEDICATED_PATCH | Freq: Once | TRANSDERMAL | Status: DC | PRN

## 2017-07-29 MED ORDER — CHLORHEXIDINE GLUCONATE CLOTH 2 % EX PADS: 6.0000 | MEDICATED_PAD | Freq: Once | CUTANEOUS | Status: DC

## 2017-07-29 MED ORDER — MEPERIDINE HCL 25 MG/ML IJ SOLN: 6.2500 mg | INTRAMUSCULAR | Status: DC | PRN

## 2017-07-29 MED ORDER — ONDANSETRON HCL 4 MG/2ML IJ SOLN
INTRAMUSCULAR | Status: DC | PRN
  Administered 2017-07-29: 4 mg via INTRAVENOUS

## 2017-07-29 MED ORDER — BUPIVACAINE-EPINEPHRINE 0.5% -1:200000 IJ SOLN
INTRAMUSCULAR | Status: DC | PRN
  Administered 2017-07-29: 14 mL

## 2017-07-29 MED ORDER — ACETAMINOPHEN 500 MG PO TABS
1000.0000 mg | ORAL_TABLET | ORAL | Status: AC
  Administered 2017-07-29: 1000 mg via ORAL

## 2017-07-29 MED ORDER — LIDOCAINE HCL (CARDIAC) PF 100 MG/5ML IV SOSY
PREFILLED_SYRINGE | INTRAVENOUS | Status: DC | PRN
  Administered 2017-07-29: 40 mg via INTRAVENOUS

## 2017-07-29 MED ORDER — FENTANYL CITRATE (PF) 100 MCG/2ML IJ SOLN: 25.0000 ug | INTRAMUSCULAR | Status: DC | PRN

## 2017-07-29 MED ORDER — ONDANSETRON HCL 4 MG/2ML IJ SOLN
INTRAMUSCULAR | Status: AC
  Filled 2017-07-29: qty 2

## 2017-07-29 MED ORDER — EPHEDRINE SULFATE 50 MG/ML IJ SOLN
INTRAMUSCULAR | Status: DC | PRN
  Administered 2017-07-29: 10 mg via INTRAVENOUS
  Administered 2017-07-29 (×2): 5 mg via INTRAVENOUS

## 2017-07-29 MED ORDER — LIDOCAINE HCL (CARDIAC) PF 100 MG/5ML IV SOSY
PREFILLED_SYRINGE | INTRAVENOUS | Status: AC
  Filled 2017-07-29: qty 5

## 2017-07-29 MED ORDER — FENTANYL CITRATE (PF) 100 MCG/2ML IJ SOLN: 50.0000 ug | INTRAMUSCULAR | Status: DC | PRN

## 2017-07-29 MED ORDER — EPHEDRINE SULFATE 50 MG/ML IJ SOLN
INTRAMUSCULAR | Status: AC
  Filled 2017-07-29: qty 1

## 2017-07-29 MED ORDER — FENTANYL CITRATE (PF) 100 MCG/2ML IJ SOLN
INTRAMUSCULAR | Status: DC | PRN
  Administered 2017-07-29: 25 ug via INTRAVENOUS

## 2017-07-29 MED ORDER — SUCCINYLCHOLINE CHLORIDE 200 MG/10ML IV SOSY
PREFILLED_SYRINGE | INTRAVENOUS | Status: AC
  Filled 2017-07-29: qty 10

## 2017-07-29 MED ORDER — DEXAMETHASONE SODIUM PHOSPHATE 4 MG/ML IJ SOLN
INTRAMUSCULAR | Status: DC | PRN
  Administered 2017-07-29: 5 mg via INTRAVENOUS

## 2017-07-29 MED ORDER — BUPIVACAINE-EPINEPHRINE (PF) 0.5% -1:200000 IJ SOLN
INTRAMUSCULAR | Status: AC
  Filled 2017-07-29: qty 60

## 2017-07-29 MED ORDER — DEXAMETHASONE SODIUM PHOSPHATE 10 MG/ML IJ SOLN
INTRAMUSCULAR | Status: AC
  Filled 2017-07-29: qty 1

## 2017-07-29 MED ORDER — PROPOFOL 10 MG/ML IV BOLUS
INTRAVENOUS | Status: DC | PRN
  Administered 2017-07-29: 100 mg via INTRAVENOUS

## 2017-07-29 MED ORDER — GABAPENTIN 300 MG PO CAPS
ORAL_CAPSULE | ORAL | Status: AC
  Filled 2017-07-29: qty 1

## 2017-07-29 MED ORDER — FENTANYL CITRATE (PF) 100 MCG/2ML IJ SOLN
INTRAMUSCULAR | Status: AC
  Filled 2017-07-29: qty 2

## 2017-07-29 MED ORDER — OXYCODONE HCL 5 MG PO TABS: 5.0000 mg | ORAL_TABLET | Freq: Four times a day (QID) | ORAL | 0 refills | Status: DC | PRN

## 2017-07-29 MED ORDER — GABAPENTIN 300 MG PO CAPS
300.0000 mg | ORAL_CAPSULE | ORAL | Status: AC
  Administered 2017-07-29: 300 mg via ORAL

## 2017-07-29 MED ORDER — MIDAZOLAM HCL 2 MG/2ML IJ SOLN: 1.0000 mg | INTRAMUSCULAR | Status: DC | PRN

## 2017-07-29 MED ORDER — CEFAZOLIN SODIUM-DEXTROSE 2-4 GM/100ML-% IV SOLN
2.0000 g | INTRAVENOUS | Status: AC
  Administered 2017-07-29: 2 g via INTRAVENOUS

## 2017-07-29 MED ORDER — CEFAZOLIN SODIUM-DEXTROSE 2-4 GM/100ML-% IV SOLN
INTRAVENOUS | Status: AC
  Filled 2017-07-29: qty 100

## 2017-07-29 SURGICAL SUPPLY — 43 items
ADH SKN CLS APL DERMABOND .7 (GAUZE/BANDAGES/DRESSINGS) ×1
BINDER BREAST MEDIUM (GAUZE/BANDAGES/DRESSINGS) ×2 IMPLANT
BLADE HEX COATED 2.75 (ELECTRODE) ×3 IMPLANT
BLADE SURG 15 STRL LF DISP TIS (BLADE) ×1 IMPLANT
BLADE SURG 15 STRL SS (BLADE) ×3
CANISTER SUCT 1200ML W/VALVE (MISCELLANEOUS) IMPLANT
CHLORAPREP W/TINT 26ML (MISCELLANEOUS) ×3 IMPLANT
CLIP VESOCCLUDE SM WIDE 6/CT (CLIP) ×2 IMPLANT
COVER BACK TABLE 60X90IN (DRAPES) ×3 IMPLANT
COVER MAYO STAND STRL (DRAPES) ×3 IMPLANT
DECANTER SPIKE VIAL GLASS SM (MISCELLANEOUS) IMPLANT
DERMABOND ADVANCED (GAUZE/BANDAGES/DRESSINGS) ×2
DERMABOND ADVANCED .7 DNX12 (GAUZE/BANDAGES/DRESSINGS) ×1 IMPLANT
DRAPE LAPAROTOMY 100X72 PEDS (DRAPES) ×3 IMPLANT
DRAPE UTILITY XL STRL (DRAPES) ×3 IMPLANT
ELECT REM PT RETURN 9FT ADLT (ELECTROSURGICAL) ×3
ELECTRODE REM PT RTRN 9FT ADLT (ELECTROSURGICAL) ×1 IMPLANT
GAUZE SPONGE 4X4 12PLY STRL LF (GAUZE/BANDAGES/DRESSINGS) ×3 IMPLANT
GLOVE BIO SURGEON STRL SZ 6.5 (GLOVE) ×1 IMPLANT
GLOVE BIO SURGEONS STRL SZ 6.5 (GLOVE) ×1
GLOVE SURG SIGNA 7.5 PF LTX (GLOVE) ×3 IMPLANT
GOWN STRL REUS W/ TWL LRG LVL3 (GOWN DISPOSABLE) ×1 IMPLANT
GOWN STRL REUS W/ TWL XL LVL3 (GOWN DISPOSABLE) ×1 IMPLANT
GOWN STRL REUS W/TWL LRG LVL3 (GOWN DISPOSABLE) ×3
GOWN STRL REUS W/TWL XL LVL3 (GOWN DISPOSABLE) ×3
KIT MARKER MARGIN INK (KITS) ×3 IMPLANT
NDL HYPO 25X1 1.5 SAFETY (NEEDLE) ×1 IMPLANT
NEEDLE HYPO 25X1 1.5 SAFETY (NEEDLE) ×3 IMPLANT
NS IRRIG 1000ML POUR BTL (IV SOLUTION) ×3 IMPLANT
PACK BASIN DAY SURGERY FS (CUSTOM PROCEDURE TRAY) ×3 IMPLANT
PENCIL BUTTON HOLSTER BLD 10FT (ELECTRODE) ×3 IMPLANT
SLEEVE SCD COMPRESS KNEE MED (MISCELLANEOUS) IMPLANT
SPONGE LAP 4X18 RFD (DISPOSABLE) ×3 IMPLANT
SUT MNCRL AB 4-0 PS2 18 (SUTURE) ×3 IMPLANT
SUT SILK 2 0 SH (SUTURE) ×1 IMPLANT
SUT VIC AB 3-0 SH 27 (SUTURE) ×3
SUT VIC AB 3-0 SH 27X BRD (SUTURE) ×1 IMPLANT
SYR CONTROL 10ML LL (SYRINGE) ×3 IMPLANT
TOWEL GREEN STERILE FF (TOWEL DISPOSABLE) ×3 IMPLANT
TOWEL OR NON WOVEN STRL DISP B (DISPOSABLE) ×3 IMPLANT
TUBE CONNECTING 20'X1/4 (TUBING)
TUBE CONNECTING 20X1/4 (TUBING) IMPLANT
YANKAUER SUCT BULB TIP NO VENT (SUCTIONS) IMPLANT

## 2017-07-29 NOTE — Anesthesia Postprocedure Evaluation (Signed)
Anesthesia Post Note  Patient: Victoria Long  Procedure(s) Performed: LEFT BREAST PARTIAL MASTECTOMY (Left Breast)     Patient location during evaluation: PACU Anesthesia Type: General Level of consciousness: awake and alert Pain management: pain level controlled Vital Signs Assessment: post-procedure vital signs reviewed and stable Respiratory status: spontaneous breathing, nonlabored ventilation, respiratory function stable and patient connected to nasal cannula oxygen Cardiovascular status: blood pressure returned to baseline and stable Postop Assessment: no apparent nausea or vomiting Anesthetic complications: no    Last Vitals:  Vitals:   07/29/17 1015 07/29/17 1030  BP: (!) 129/59 135/61  Pulse: 71 75  Resp: 16 17  Temp:  (!) 36.4 C  SpO2: 96% 94%    Last Pain:  Vitals:   07/29/17 1015  TempSrc:   PainSc: 0-No pain                 Caliph Borowiak

## 2017-07-29 NOTE — Op Note (Signed)
LEFT BREAST PARTIAL MASTECTOMY  Procedure Note  Victoria Long 07/29/2017   Pre-op Diagnosis: LEFT BREAST CANCER     Post-op Diagnosis: same  Procedure(s): LEFT BREAST PARTIAL MASTECTOMY  Surgeon(s): [name], MD  Anesthesia: General  Staff:  Circulator: Harrel Lemon, RN Relief Circulator: Faythe Dingwall, RN Scrub Person: Lorenza Burton, CST  Estimated Blood Loss: Minimal               Specimens: sent to path  Indications: This is an 84 year old female with a palpable left breast mass.  Stereotactic biopsy was performed showing an invasive cancer.  The decision was made to proceed with a left breast partial mastectomy  Procedure: The patient was brought to the operating room and identified as the correct patient.  She was placed supine on the operating room table and general anesthesia was induced.  Her left breast was then prepped and draped in the usual sterile fashion.  The palpable mass was in the upper outer quadrant of the left breast.  I anesthetized the skin with Marcaine and then made an transverse elliptical incision with a scalpel.  I then dissected down to the breast tissue with electrocautery.  I then performed a partial mastectomy of the upper outer quadrant of the left breast with electrocautery going all the way down to the chest wall.  I appear to stay widely around the palpable mass.  Once the specimen was removed, I marked all margins with marker paint and sent the specimen to pathology for evaluation.  Hemostasis was achieved with the cautery.  I anesthetized the wound further with Marcaine.  I placed surgical clips in the periphery circumferentially around the cavity.  I then closed the deep tissue with interrupted 3-0 Vicryl sutures.  I then closed the subcutaneous tissue with interrupted 3-0 Vicryl sutures and closed the skin with a running 4-0 Monocryl.  Dermabond was then applied.  Patient was next placed in a breast binder.  The patient tolerated the  procedure well.  All the counts were correct at the end of the procedure.  The patient was then extubated in the operating room and taken in a stable condition to the recovery room.          [name] A   Date: 07/29/2017  Time: 8:50 AM

## 2017-07-29 NOTE — Anesthesia Procedure Notes (Signed)
Procedure Name: LMA Insertion Date/Time: 07/29/2017 8:22 AM Performed by: Gwyndolyn Saxon, CRNA Pre-anesthesia Checklist: Patient identified, Emergency Drugs available, Suction available, Patient being monitored and Timeout performed Patient Re-evaluated:Patient Re-evaluated prior to induction Oxygen Delivery Method: Circle system utilized Preoxygenation: Pre-oxygenation with 100% oxygen Induction Type: IV induction Ventilation: Mask ventilation without difficulty LMA: LMA inserted LMA Size: 4.0 Number of attempts: 1 Placement Confirmation: positive ETCO2,  CO2 detector and breath sounds checked- equal and bilateral Tube secured with: Tape Dental Injury: Teeth and Oropharynx as per pre-operative assessment

## 2017-07-29 NOTE — Discharge Instructions (Signed)
Post Anesthesia Home Care Instructions  Activity: Get plenty of rest for the remainder of the day. A responsible individual must stay with you for 24 hours following the procedure.  For the next 24 hours, DO NOT: -Drive a car -Paediatric nurse -Drink alcoholic beverages -Take any medication unless instructed by your physician -Make any legal decisions or sign important papers.  Meals: Start with liquid foods such as gelatin or soup. Progress to regular foods as tolerated. Avoid greasy, spicy, heavy foods. If nausea and/or vomiting occur, drink only clear liquids until the nausea and/or vomiting subsides. Call your physician if vomiting continues.  Special Instructions/Symptoms: Your throat may feel dry or sore from the anesthesia or the breathing tube placed in your throat during surgery. If this causes discomfort, gargle with warm salt water. The discomfort should disappear within 24 hours.  If you had a scopolamine patch placed behind your ear for the management of post- operative nausea and/or vomiting:  1. The medication in the patch is effective for 72 hours, after which it should be removed.  Wrap patch in a tissue and discard in the trash. Wash hands thoroughly with soap and water. 2. You may remove the patch earlier than 72 hours if you experience unpleasant side effects which may include dry mouth, dizziness or visual disturbances. 3. Avoid touching the patch. Wash your hands with soap and water after contact with the patch.   [place] Office Phone Number [phone]  BREAST BIOPSY/ PARTIAL MASTECTOMY: POST OP INSTRUCTIONS  Always review your discharge instruction sheet given to you by the facility where your surgery was performed.  IF YOU HAVE DISABILITY OR FAMILY LEAVE FORMS, YOU MUST BRING THEM TO THE OFFICE FOR PROCESSING.  DO NOT GIVE THEM TO YOUR DOCTOR.  1. A prescription for pain medication may be given to you upon discharge.  Take your pain  medication as prescribed, if needed.  If narcotic pain medicine is not needed, then you may take acetaminophen (Tylenol) or ibuprofen (Advil) as needed. 2. Take your usually prescribed medications unless otherwise directed 3. If you need a refill on your pain medication, please contact your pharmacy.  They will contact our office to request authorization.  Prescriptions will not be filled after 5pm or on week-ends. 4. You should eat very light the first 24 hours after surgery, such as soup, crackers, pudding, etc.  Resume your normal diet the day after surgery. 5. Most patients will experience some swelling and bruising in the breast.  Ice packs and a good support bra will help.  Swelling and bruising can take several days to resolve.  6. It is common to experience some constipation if taking pain medication after surgery.  Increasing fluid intake and taking a stool softener will usually help or prevent this problem from occurring.  A mild laxative (Milk of Magnesia or Miralax) should be taken according to package directions if there are no bowel movements after 48 hours. 7. Unless discharge instructions indicate otherwise, you may remove your bandages 24-48 hours after surgery, and you may shower at that time.  You may have steri-strips (small skin tapes) in place directly over the incision.  These strips should be left on the skin for 7-10 days.  If your surgeon used skin glue on the incision, you may shower in 24 hours.  The glue will flake off over the next 2-3 weeks.  Any sutures or staples will be removed at the office during your follow-up visit. 8. ACTIVITIES:  You  may resume regular daily activities (gradually increasing) beginning the next day.  Wearing a good support bra or sports bra minimizes pain and swelling.  You may have sexual intercourse when it is comfortable. a. You may drive when you no longer are taking prescription pain medication, you can comfortably wear a seatbelt, and you can  safely maneuver your car and apply brakes. b. RETURN TO WORK:  ______________________________________________________________________________________ 9. You should see your doctor in the office for a follow-up appointment approximately two weeks after your surgery.  Your doctors nurse will typically make your follow-up appointment when she calls you with your pathology report.  Expect your pathology report 2-3 business days after your surgery.  You may call to check if you do not hear from [name] after three days. 10. OTHER INSTRUCTIONS: _OK TOTO REMOVE THE BINDER TOMORROW AND SHOWER 11. IBUPROFEN, TYLENOL ALSO FOR PAIN 12. ______________________________________________________________________________________________ _____________________________________________________________________________________________________________________________________ _____________________________________________________________________________________________________________________________________ _____________________________________________________________________________________________________________________________________  WHEN TO CALL YOUR DOCTOR: 1. Fever over 101.0 2. Nausea and/or vomiting. 3. Extreme swelling or bruising. 4. Continued bleeding from incision. 5. Increased pain, redness, or drainage from the incision.  The clinic staff is available to answer your questions during regular business hours.  Please dont hesitate to call and ask to speak to one of the nurses for clinical concerns.  If you have a medical emergency, go to the nearest emergency room or call 911.  A surgeon from [facility] is always on call at the hospital.  For further questions, please visit [email or web address]

## 2017-07-29 NOTE — Interval H&P Note (Signed)
History and Physical Interval Note: no change in H and P  07/29/2017 7:49 AM  Victoria Long  has presented today for surgery, with the diagnosis of LEFT BREAST CANCER  The various methods of treatment have been discussed with the patient and family. After consideration of risks, benefits and other options for treatment, the patient has consented to  Procedure(s): LEFT BREAST PARTIAL MASTECTOMY (Left) as a surgical intervention .  The patient's history has been reviewed, patient examined, no change in status, stable for surgery.  I have reviewed the patient's chart and labs.  Questions were answered to the patient's satisfaction.     Durant Scibilia A

## 2017-07-29 NOTE — Transfer of Care (Signed)
Immediate Anesthesia Transfer of Care Note  Patient: Victoria Long  Procedure(s) Performed: LEFT BREAST PARTIAL MASTECTOMY (Left Breast)  Patient Location: PACU  Anesthesia Type:General  Level of Consciousness: drowsy  Airway & Oxygen Therapy: Patient Spontanous Breathing and Patient connected to face mask oxygen  Post-op Assessment: Report given to RN and Post -op Vital signs reviewed and stable  Post vital signs: Reviewed and stable  Last Vitals:  Vitals Value Taken Time  BP    Temp    Pulse 85 07/29/2017  8:57 AM  Resp 0 07/29/2017  8:57 AM  SpO2 100 % 07/29/2017  8:57 AM  Vitals shown include unvalidated device data.  Last Pain:  Vitals:   07/29/17 0721  TempSrc: Oral  PainSc: 0-No pain         Complications: No apparent anesthesia complications

## 2017-07-30 ENCOUNTER — Encounter (HOSPITAL_BASED_OUTPATIENT_CLINIC_OR_DEPARTMENT_OTHER): Payer: Self-pay | Admitting: Surgery

## 2017-08-08 HISTORY — PX: BREAST LUMPECTOMY: SHX2

## 2017-09-02 ENCOUNTER — Telehealth: Payer: Self-pay | Admitting: Oncology

## 2017-09-02 ENCOUNTER — Encounter: Payer: Self-pay | Admitting: Adult Health

## 2017-09-02 ENCOUNTER — Encounter: Payer: Self-pay | Admitting: Physician Assistant

## 2017-09-02 ENCOUNTER — Inpatient Hospital Stay: Payer: Medicare Other | Attending: Oncology | Admitting: Adult Health

## 2017-09-02 VITALS — BP 146/67 | HR 73 | Temp 98.1°F | Resp 18 | Ht 62.0 in | Wt 132.3 lb

## 2017-09-02 DIAGNOSIS — C50412 Malignant neoplasm of upper-outer quadrant of left female breast: Secondary | ICD-10-CM

## 2017-09-02 DIAGNOSIS — E2839 Other primary ovarian failure: Secondary | ICD-10-CM

## 2017-09-02 DIAGNOSIS — C50912 Malignant neoplasm of unspecified site of left female breast: Secondary | ICD-10-CM

## 2017-09-02 DIAGNOSIS — Z170 Estrogen receptor positive status [ER+]: Secondary | ICD-10-CM

## 2017-09-02 MED ORDER — ANASTROZOLE 1 MG PO TABS: 1.0000 mg | ORAL_TABLET | Freq: Every day | ORAL | 5 refills | Status: DC

## 2017-09-02 NOTE — Progress Notes (Addendum)
[place]  Telephone:([phone] Fax:([phone]) [phone]     ID: Victoria Long DOB: 26-Mar-1928  MR#: [id number]  PYP#:[id number]  Patient Care Team: Victoria Pinto, MD as PCP - General (Internal Medicine) Victoria Mayer, MD as Consulting Physician (Gastroenterology) Victoria Pho, MD as Referring Physician (Orthopedic Surgery) Victoria Bookbinder, MD as Consulting Physician (Dermatology) Long, Victoria Dad, MD as Consulting Physician (Oncology) Victoria Miner, MD as Consulting Physician (Dermatology) Victoria Keens, MD as Consulting Physician (General Surgery) Victoria Dock, NP OTHER MD:  CHIEF COMPLAINT: Estrogen receptor positive breast cancer  CURRENT TREATMENT: Awaiting definitive surgery   HISTORY OF CURRENT ILLNESS: Victoria Long had routine screening mammography on 06/23/2017 showing a possible abnormality in the left breast. She underwent unilateral left diagnostic mammography with tomography and left breast ultrasonography at [facility] on 06/27/2017 showing: breast density category B. There was a mass at 1 o'clock in the left breast upper outer quadrant measuring 1.6 x 1.0 x 1.2 cm and located 4 cm from the nipple. There was no evidence of left axillary lymphadenopathy.  Accordingly on 07/01/2017 she proceeded to biopsy of the left breast area in question. The pathology from this procedure showed ([id number]): Invasive ductal carcinoma grade I. Prognostic indicators significant for: estrogen receptor, 100% positive and progesterone receptor, 60% positive, both with strong staining intensity. Proliferation marker Ki67 at 50% . HER2 not amplified with ratios HER2/CEP17 signals 1.43 and average HER2 copies per cell 3.30  Note that she also has a history of right-sided breast cancer status post right lumpectomy and sentinel lymph node sampling 06/29/2010, for a 0.6 cm invasive ductal carcinoma, grade 1, present at the anterior margin, with both sentinel  lymph nodes negative.  The tumor was estrogen and progesterone receptor strongly positive, HER-2 not amplified, with an MIB-104, and was treated adjuvantly with radiation alone.  The patient's subsequent history is as detailed below.  INTERVAL HISTORY: Victoria Long is here today for follow up of her estrogen positive breast cancer after undergoing left breast lumpectomy on 07/29/2017.  She tolerated her surgery well and was seen by Dr. Ninfa Long postoperatively. He told her that she would not need radiation, and for her to anticipate anti estrogen therapy alone.  She is wanting to review this today in further detail.   REVIEW OF SYSTEMS: Victoria Long is doing well today.  She notes that she has knee pain that prevents her from being as active as she wants to be.  She tells me that it is bone on bone and she needs a knee replacement.  She denies any other issues.  Victoria Long lives alone and in her spare time enjoys playing bridge and reading.  Victoria Long does have some questions about receiving anti estrogen therapy.  Victoria Long notes that she has undergone bone density testing and it was years ago.  Otherwise she is doing well today and a detailed ROS was conducted and was non contributory.     PAST MEDICAL HISTORY: Past Medical History:  Diagnosis Date  . Arthritis    knees  . Breast cancer ([place]) 06/2017   left breast  . Cancer ([place])    right breast cancer  . Hyperlipidemia   . Melanoma ([place]) 2014   right humerus  . Personal history of radiation therapy 2012  . Prediabetes   . Vitamin D deficiency     PAST SURGICAL HISTORY: Past Surgical History:  Procedure Laterality Date  . BREAST BIOPSY Right 05/28/2010  . BREAST LUMPECTOMY Right 06/29/2010   with radiation  no chemo  . CATARACT EXTRACTION Bilateral 2009  . CESAREAN SECTION    . MASTECTOMY, PARTIAL Left 07/29/2017   Procedure: LEFT BREAST PARTIAL MASTECTOMY;  Surgeon: Victoria Keens, MD;  Location: [facility];  Service: General;  Laterality:  Left;  Marland Kitchen MELANOMA EXCISION Right 2014   humerus  Lipoma removed from right arm   FAMILY HISTORY Family History  Problem Relation Age of Onset  . Arthritis Mother   . Rheum arthritis Mother   . Hypertension Sister   . Cancer Sister        type unknown  . Diabetes Maternal Grandmother   The patient's mother died at age 8 due to RA. The patient's father died due to heart issues. The patient had 1 brother and 2 sisters. She denies a family history of breast or ovarian cancer.  GYNECOLOGIC HISTORY:  No LMP recorded. Patient is postmenopausal. Menarche: 84 years old Age at first live birth: 84 years old She is GXP4. Her LMP was in her early 17's. She did not use HRT or oral contraception.    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 She is widowed twice over. At home is just herself with no pets. The patient's oldest son, Victoria Long. The patient's second is son, Victoria Long. The patient's third child is Victoria Crumble "Harrie Jeans" who works in Press photographer in [place]. The patient's youngest child is son, Victoria Baltimore "Mikki Santee" who lives in [place] in business. The patient has 10 grandchildren- who are all college graduates. She also has 6 great-grandchildren. The patient belongs to [employer] in [place].     ADVANCED DIRECTIVES: Victoria Long intends to name her son, Victoria Reichmann "Elta Guadeloupe" as her HCPOA   HEALTH MAINTENANCE: Social History   Tobacco Use  . Smoking status: Former Smoker    Years: 10.00    Last attempt to quit: 08/21/1976    Years since quitting: 41.0  . Smokeless tobacco: Never Used  Substance Use Topics  . Alcohol use: Yes    Alcohol/week: 4.0 standard drinks    Types: 4 Standard drinks or equivalent per week    Comment: wine  . Drug use: No     Colonoscopy: UTD  PAP:  Bone density: recent/ normal    Allergies  Allergen Reactions  . Tramadol       Medication caused dizziness    Current Outpatient Medications  Medication Sig Dispense Refill  . calcium carbonate (OS-CAL) 600 MG TABS Take 600 mg by mouth 2 (two) times daily with a meal.     . Cholecalciferol (VITAMIN D) 1000 UNITS capsule Take 1,000 Units by mouth 2 (two) times daily.     Marland Kitchen ibuprofen (ADVIL,MOTRIN) 200 MG tablet Take 200 mg by mouth 4 (four) times daily.    Marland Kitchen LYSINE HCL PO Take 500 mg by mouth daily.     . Multiple Vitamins-Minerals (MULTIVITAMIN PO) Take by mouth daily.    . Omega-3 Fatty Acids (FISH OIL PO) Take by mouth daily. Mega Red     No current facility-administered medications for this visit.     OBJECTIVE: Older white woman in no acute distress  Vitals:   09/02/17 1303  BP: (!) 146/67  Pulse: 73  Resp: 18  Temp: 98.1 F (36.7 C)  SpO2: 98%     Body mass index is 24.2 kg/m.   Wt Readings from Last 3  Encounters:  09/02/17 132 lb 4.8 oz (60 kg)  07/29/17 129 lb 12.8 oz (58.9 kg)  07/23/17 129 lb 12.8 oz (58.9 kg)      ECOG FS:1 - Symptomatic but completely ambulatory GENERAL: Patient is a well appearing female in no acute distress HEENT:  Sclerae anicteric.  Oropharynx clear and moist. No ulcerations or evidence of oropharyngeal candidiasis. Neck is supple.  NODES:  No cervical, supraclavicular, or axillary lymphadenopathy palpated.  BREAST EXAM:  Right breast without nodules,masses, skin or nipple changes, left breast s/p lumpectomy, healing well LUNGS:  Clear to auscultation bilaterally.  No wheezes or rhonchi. HEART:  Regular rate and rhythm. No murmur appreciated. ABDOMEN:  Soft, nontender.  Positive, normoactive bowel sounds. No organomegaly palpated. MSK:  No focal spinal tenderness to palpation. Full range of motion bilaterally in the upper extremities. EXTREMITIES:  No peripheral edema.   SKIN:  Clear with no obvious rashes or skin changes. No nail dyscrasia. NEURO:  Nonfocal. Well oriented.  Appropriate affect.     LAB  RESULTS:  CMP     Component Value Date/Time   NA 141 07/23/2017 1510   NA 144 10/24/2014 1426   K 4.4 07/23/2017 1510   K 4.1 10/24/2014 1426   CL 106 07/23/2017 1510   CL 107 06/08/2012 0851   CO2 29 07/23/2017 1510   CO2 29 10/24/2014 1426   GLUCOSE 95 07/23/2017 1510   GLUCOSE 114 10/24/2014 1426   GLUCOSE 107 (H) 06/08/2012 0851   BUN 19 07/23/2017 1510   BUN 13.1 10/24/2014 1426   CREATININE 0.71 07/23/2017 1510   CREATININE 0.63 03/27/2017 1250   CREATININE 0.7 10/24/2014 1426   CALCIUM 9.9 07/23/2017 1510   CALCIUM 9.3 10/24/2014 1426   PROT 6.7 07/23/2017 1510   PROT 6.1 (L) 10/24/2014 1426   ALBUMIN 4.2 07/23/2017 1510   ALBUMIN 3.5 10/24/2014 1426   AST 19 07/23/2017 1510   AST 18 10/24/2014 1426   ALT 18 07/23/2017 1510   ALT 16 10/24/2014 1426   ALKPHOS 60 07/23/2017 1510   ALKPHOS 54 10/24/2014 1426   BILITOT 0.3 07/23/2017 1510   BILITOT 0.43 10/24/2014 1426   GFRNONAA >60 07/23/2017 1510   GFRNONAA 81 03/27/2017 1250   GFRAA >60 07/23/2017 1510   GFRAA 93 03/27/2017 1250    No results found for: TOTALPROTELP, ALBUMINELP, A1GS, A2GS, BETS, BETA2SER, GAMS, MSPIKE, SPEI  No results found for: KPAFRELGTCHN, LAMBDASER, KAPLAMBRATIO  Lab Results  Component Value Date   WBC 5.8 07/23/2017   NEUTROABS 3.2 07/23/2017   HGB 12.7 07/23/2017   HCT 38.8 07/23/2017   MCV 97.0 07/23/2017   PLT 227 07/23/2017    @LASTCHEMISTRY @  Lab Results  Component Value Date   LABCA2 37 12/10/2011    No components found for: EPPIRJ188  No results for input(s): INR in the last 168 hours.  Lab Results  Component Value Date   LABCA2 37 12/10/2011    No results found for: CZY606  No results found for: TKZ601  No results found for: UXN235  No results found for: CA2729  No components found for: HGQUANT  No results found for: CEA1 / No results found for: CEA1   No results found for: AFPTUMOR  No results found for: [place]  No results found for:  PSA1  No visits with results within 3 Day(s) from this visit.  Latest known visit with results is:  Appointment on 07/23/2017  Component Date Value Ref Range Status  . Sodium 07/23/2017 141  135 - 145 mmol/L Final   Please note reference intervals were recently updated.  . Potassium 07/23/2017 4.4  3.5 - 5.1 mmol/L Final  . Chloride 07/23/2017 106  98 - 111 mmol/L Final  . CO2 07/23/2017 29  22 - 32 mmol/L Final  . Glucose, Bld 07/23/2017 95  70 - 99 mg/dL Final  . BUN 07/23/2017 19  8 - 23 mg/dL Final   Please note change in reference range.  . Creatinine 07/23/2017 0.71  0.44 - 1.00 mg/dL Final  . Calcium 07/23/2017 9.9  8.9 - 10.3 mg/dL Final  . Total Protein 07/23/2017 6.7  6.5 - 8.1 g/dL Final  . Albumin 07/23/2017 4.2  3.5 - 5.0 g/dL Final  . AST 07/23/2017 19  15 - 41 U/L Final  . ALT 07/23/2017 18  0 - 44 U/L Final  . Alkaline Phosphatase 07/23/2017 60  38 - 126 U/L Final  . Total Bilirubin 07/23/2017 0.3  0.3 - 1.2 mg/dL Final  . GFR, Est Non Af Am 07/23/2017 >60  >60 mL/min Final  . GFR, Est AFR Am 07/23/2017 >60  >60 mL/min Final   Comment: (NOTE) The eGFR has been calculated using the CKD EPI equation. This calculation has not been validated in all clinical situations. eGFR's persistently <60 mL/min signify possible Chronic Kidney Disease.   Georgiann Hahn gap 07/23/2017 6  5 - 15 Final   Performed at [facility], [place] [address] [place], [place] [address]  . WBC Count 07/23/2017 5.8  3.9 - 10.3 K/uL Final  . RBC 07/23/2017 4.00  3.70 - 5.45 MIL/uL Final  . Hemoglobin 07/23/2017 12.7  11.6 - 15.9 g/dL Final  . HCT 07/23/2017 38.8  34.8 - 46.6 % Final  . MCV 07/23/2017 97.0  79.5 - 101.0 fL Final  . MCH 07/23/2017 31.8  25.1 - 34.0 pg Final  . MCHC 07/23/2017 32.7  31.5 - 36.0 g/dL Final  . RDW 07/23/2017 13.6  11.2 - 14.5 % Final  . Platelet Count 07/23/2017 227  145 - 400 K/uL Final  . Neutrophils Relative % 07/23/2017 55  % Final  . Neutro  Abs 07/23/2017 3.2  1.5 - 6.5 K/uL Final  . Lymphocytes Relative 07/23/2017 34  % Final  . Lymphs Abs 07/23/2017 2.0  0.9 - 3.3 K/uL Final  . Monocytes Relative 07/23/2017 8  % Final  . Monocytes Absolute 07/23/2017 0.5  0.1 - 0.9 K/uL Final  . Eosinophils Relative 07/23/2017 2  % Final  . Eosinophils Absolute 07/23/2017 0.1  0.0 - 0.5 K/uL Final  . Basophils Relative 07/23/2017 1  % Final  . Basophils Absolute 07/23/2017 0.0  0.0 - 0.1 K/uL Final   Performed at [facility], [place] [address] [place], [place] [address]    (this displays the last labs from the last 3 days)  No results found for: TOTALPROTELP, ALBUMINELP, A1GS, A2GS, BETS, BETA2SER, GAMS, MSPIKE, SPEI (this displays SPEP labs)  No results found for: KPAFRELGTCHN, LAMBDASER, KAPLAMBRATIO (kappa/lambda light chains)  No results found for: HGBA, HGBA2QUANT, HGBFQUANT, HGBSQUAN (Hemoglobinopathy evaluation)   No results found for: LDH  Lab Results  Component Value Date   IRON 64 08/09/2014   TIBC 336 08/09/2014   IRONPCTSAT 19 (L) 08/09/2014   (Iron and TIBC)  No results found for: FERRITIN  Urinalysis    Component Value Date/Time   COLORURINE YELLOW 08/22/2016 0935   APPEARANCEUR CLEAR 08/22/2016 0935   LABSPEC 1.013 08/22/2016 0935  PHURINE 6.5 08/22/2016 0935   GLUCOSEU NEGATIVE 08/22/2016 0935   HGBUR NEGATIVE 08/22/2016 0935   BILIRUBINUR NEGATIVE 08/22/2016 0935   KETONESUR NEGATIVE 08/22/2016 0935   PROTEINUR NEGATIVE 08/22/2016 0935   UROBILINOGEN 0.2 08/09/2014 1505   NITRITE NEGATIVE 08/22/2016 0935   LEUKOCYTESUR NEGATIVE 08/22/2016 0935     STUDIES: No results found.  ELIGIBLE FOR AVAILABLE RESEARCH PROTOCOL: no  ASSESSMENT: 84 y.o. [place], [place] woman with a prior history of right-sided breast cancer, now status post left breast upper outer quadrant biopsy 07/01/2017 for a clinical T1c N0, stage IA invasive ductal carcinoma, grade 1, estrogen and progesterone  receptor positive, HER-2 not amplified, with an MIB-1 of 50%  (1) status post right lumpectomy June of 2012 for a T1b N0 grade 1 invasive ductal carcinoma, with abundant extracellular mucin, and no lymph node involvement (T1b, N0, or stage I).    The tumor was estrogen and progesterone receptor strongly positive, with no HER-2 amplification and then MIB-104%  (a) completed adjuvant radiation August 2012  (b) no other adjuvant chemotherapy  (c) two right axillary lymph nodes were removed  (2) Left breast lumpectomy on 07/29/2017: Invasive ductal carcinoma, grade 1, 1.6cm, DICS, margins negative, T1C, NX, M0.  Stage IA.   (3) consider adjuvant radiation depending on tolerance of anti-estrogens  (4) anastrozole started 09/02/2017, stopped 09/08/2017 with intolerance    PLAN:  Izora Gala met with Dr. Jana Hakim today to review her above left breast lumpectomy pathology results.  He also reviewed with her that at [facility] conference that she will not require radiation and will proceed to anti estrogen therapy alone.  She is agreeable to this.  I gave her a handout comparing and contrasting anti estrogen therapies.  She is going to start taking Ansatrozole.  This was reviewed in detail by Dr. Jana Hakim.  I ordered her bone density test to be done at the breast center.  She will return in 2-3 months for f/u with Dr. Jana Hakim to evaluate how she is tolerating treatment.    Anarely knows to call for any questions or concerns prior to her next appointment with [name].    A total of (30) minutes of face-to-face time was spent with this patient with greater than 50% of that time in counseling and care-coordination.   Wilber Bihari, NP  09/02/17 1:17 PM Medical Oncology and Hematology [facility] [address] [place], [place] [address] Tel. [phone]    Fax. [phone]   ADDENDUM: I met with Izora Gala on 09/02/2017 and reviewed her pathology which is favorable.  We reviewed the studies that allow  patients her age with tumors like hers to avoid radiation if they take an antiestrogen for 5 years.  We then discussed the difference between tamoxifen and anastrozole in detail. She understands that anastrozole and the aromatase inhibitors in general work by blocking estrogen production. Accordingly vaginal dryness, decrease in bone density, and of course hot flashes can result. The aromatase inhibitors can also negatively affect the cholesterol profile, although that is a minor effect. One out of 5 women on aromatase inhibitors we will feel "old and achy". This arthralgia/myalgia syndrome, which resembles fibromyalgia clinically, does resolve with stopping the medications. Accordingly this is not a reason to not try an aromatase inhibitor but it is a frequent reason to stop it (in other words 20% of women will not be able to tolerate these medications).  Tamoxifen on the other hand does not block estrogen production. It does not "take away a  woman's estrogen". It blocks the estrogen receptor in breast cells. Like anastrozole, it can also cause hot flashes. As opposed to anastrozole, tamoxifen has many estrogen-like effects. It is technically an estrogen receptor modulator. This means that in some tissues tamoxifen works like estrogen-- for example it helps strengthen the bones. It tends to improve the cholesterol profile. It can cause thickening of the endometrial lining, and even endometrial polyps or rarely cancer of the uterus.(The risk of uterine cancer due to tamoxifen is one additional cancer per thousand women year). It can cause vaginal wetness or stickiness. It can cause blood clots through this estrogen-like effect--the risk of blood clots with tamoxifen is exactly the same as with birth control pills or hormone replacement.  Neither of these agents causes mood changes or weight gain, despite the popular belief that they can have these side effects. We have data from studies comparing either of these  drugs with placebo, and in those cases the control group had the same amount of weight gain and depression as the group that took the drug.  I think she would be a good candidate for anastrozole and we went ahead and placed the prescription in for her.  She will call if she has any difficulties with this drug.  I personally saw this patient and performed a substantive portion of this encounter with the listed APP documented above.   Addendum #2: The patient called today, 09/08/2017 explaining that there was no way she could possibly take this drug.  She hates to take medications.  She prefers to undergo radiation.  I have made the appropriate arrangements for her to see her radiation oncologist soon and we called her reassuring her that we expect her to do well even if she does not take the antiestrogens.  [name], MD Medical Oncology and Hematology [facility] [address] [place], [place] [address] Tel. [phone]    Fax. [phone]

## 2017-09-02 NOTE — Telephone Encounter (Signed)
Per 8/13 los.  Per LC, 2 month appt.  Scheduled Bone Density with GI [facility].  Gave patient instructions to stop vitamins and calcium 48 hours prior to appt and no perfumes/lotions/metal on clothing.  Gave patient AVS and calendar.

## 2017-09-08 ENCOUNTER — Encounter: Payer: Self-pay | Admitting: Oncology

## 2017-09-08 ENCOUNTER — Telehealth: Payer: Self-pay | Admitting: *Deleted

## 2017-09-08 ENCOUNTER — Other Ambulatory Visit: Payer: Self-pay | Admitting: Oncology

## 2017-09-08 DIAGNOSIS — Z170 Estrogen receptor positive status [ER+]: Principal | ICD-10-CM

## 2017-09-08 DIAGNOSIS — C50412 Malignant neoplasm of upper-outer quadrant of left female breast: Secondary | ICD-10-CM

## 2017-09-08 NOTE — Progress Notes (Signed)
MEDICARE WELLNESS VISIT AND FOLLOW UP  Assessment and Plan:  Vitamin D deficiency Continue supplement  Prediabetes Discussed general issues about diabetes pathophysiology and management., Educational material distributed., Suggested low cholesterol diet., Encouraged aerobic exercise., Discussed foot care., Reminded to get yearly retinal exam. -     Hemoglobin A1c  Hyperlipidemia, unspecified hyperlipidemia type -continue medications, check lipids, decrease fatty foods, increase activity.  -     Lipid panel  Encounter for long-term (current) use of medications  History of right breast cancer monitor  Pancreatic mass Due to age and discussed advanced care planning, patient just wants observation, does not want further test or any treatment  Advanced care planning/counseling discussion Patient will fill out advanced care directives with sons (has 21) Filled out DNR form today and given to patient  Encounter for Medicare annual wellness exam Due next year  Atherosclerosis of aorta ([place]) Control blood pressure, cholesterol, glucose, increase exercise.   History of melanoma Continue to monitor  Right knee pain mobic 7.5mg  RX given, continue tyelnol, continue follow up ortho Goal is to keep quality of life and keep moving  Discussed med's effects and SE's. Screening labs and tests as requested with regular follow-up as recommended.  Future Appointments  Date Time Provider [place]  09/16/2017  1:00 PM [place] None  09/16/2017  1:30 PM Hayden Pedro, PA-C [facility] None  09/16/2017  2:00 PM Kyung Rudd, MD [facility] None  10/15/2017  8:00 AM [facility] DX DEXA 1 [facility]-BREAST CE  11/04/2017  1:45 PM Magrinat, [name], MD [facility] None     During the course of the visit the patient was educated and counseled about appropriate screening and preventive services including:    Pneumococcal vaccine   Influenza vaccine  Td  vaccine  Screening electrocardiogram  Bone densitometry screening  Colorectal cancer screening  Diabetes screening  Glaucoma screening  Nutrition counseling   Advanced directives: requested   HPI  84 y.o. female  presents for a medicare visit and 3 month follow up.   Her blood pressure has been controlled at home, today their BP is BP: (!) 144/72.  She does workout. She denies chest pain, shortness of breath, dizziness.   She has graduated from [facility] for history of right breast cancer. 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  Stage IA., was on anastrozole and following with Dr. Griffith Citron, going to do radiation, has appt with Dr. Lisbeth Renshaw but going to discuss if needed due to age.  She has right knee pain, walking with cane, on advil 3 200mg  pills a day and one tylenol., takes with food. Had mechanical fall. Had injections 6 months ago with Dr. Noemi Chapel that did not help.   She is on cholesterol medication and denies myalgias. Her cholesterol is at goal. The cholesterol last visit was:  Lab Results  Component Value Date   CHOL 218 (H) 03/27/2017   HDL 69 03/27/2017   LDLCALC 127 (H) 03/27/2017   TRIG 111 03/27/2017   CHOLHDL 3.2 03/27/2017  . She has been working on diet and exercise for prediabetes, she is on bASA, she is not on ACE/ARB and denies foot ulcerations, hyperglycemia, hypoglycemia , increased appetite, nausea, paresthesia of the feet, polydipsia, polyuria, visual disturbances, vomiting and weight loss. Last A1C in the office was:  Lab Results  Component Value Date   HGBA1C  5.6 03/27/2017   Patient is on Vitamin D supplement.   Lab Results  Component Value Date   VD25OH 46 03/27/2017     She is seeing her dermatologist every 6 months for skin checks due to history of melanoma.   BMI is Body mass index is 24.29  kg/m., she is working on diet and exercise. Wt Readings from Last 3 Encounters:  09/11/17 132 lb 12.8 oz (60.2 kg)  09/02/17 132 lb 4.8 oz (60 kg)  07/29/17 129 lb 12.8 oz (58.9 kg)      Current Medications:  Current Outpatient Medications on File Prior to Visit  Medication Sig Dispense Refill  . calcium carbonate (OS-CAL) 600 MG TABS Take 600 mg by mouth 2 (two) times daily with a meal.     . Cholecalciferol (VITAMIN D) 1000 UNITS capsule Take 1,000 Units by mouth 2 (two) times daily.     Marland Kitchen ibuprofen (ADVIL,MOTRIN) 200 MG tablet Take 200 mg by mouth 4 (four) times daily.    Marland Kitchen LYSINE HCL PO Take 500 mg by mouth daily.     . Multiple Vitamins-Minerals (MULTIVITAMIN PO) Take by mouth daily.    . Omega-3 Fatty Acids (FISH OIL PO) Take by mouth daily. Mega Red     No current facility-administered medications on file prior to visit.     Health Maintenance:   Immunization History  Administered Date(s) Administered  . Influenza, High Dose Seasonal PF 11/08/2013, 11/13/2015, 10/31/2016  . Influenza-Unspecified 11/10/2012, 10/04/2014  . Pneumococcal Conjugate-13 07/27/2013  . Pneumococcal-Unspecified 11/30/2010  . Td 02/12/2009  . Zoster 02/12/2006    Tetanus: 2011 Pneumovax: 2012 Prevnar:  2015 Flu vaccine: 2017 Zostavax:  2008  MGM: 06/14/16 DEXA: 2011, refuses further Colonoscopy: 2004 declines further due to age Last Eye Exam:  Dr. Ellie Lunch, 2017 PAP 2014 declines further due to age  Patient Care Team: Unk Pinto, MD as PCP - General (Internal Medicine) [name], MD as Consulting Physician (Gastroenterology) [name], MD as Referring Physician (Orthopedic Surgery) [name], MD as Consulting Physician (Dermatology) Magrinat, [name], MD as Consulting Physician (Oncology) Griselda Miner, MD as Consulting Physician (Dermatology) [name], MD as Consulting Physician (General Surgery)  Medical History:  Past Medical History:  Diagnosis  Date  . Arthritis    knees  . Breast cancer ([place]) 06/2017   left breast  . Cancer ([place])    right breast cancer  . Hyperlipidemia   . Melanoma ([place]) 2014   right humerus  . Personal history of radiation therapy 2012  . Prediabetes   . Vitamin D deficiency    Allergies Allergies  Allergen Reactions  . Tramadol     Medication caused dizziness    SURGICAL HISTORY She  has a past surgical history that includes Cataract extraction (Bilateral, 2009); Cesarean section; Melanoma excision (Right, 2014); Breast biopsy (Right, 05/28/2010); Breast lumpectomy (Right, 06/29/2010); and Mastectomy, partial (Left, 07/29/2017). FAMILY HISTORY Her family history includes Arthritis in her mother; Cancer in her sister; Diabetes in her maternal grandmother; Hypertension in her sister; Rheum arthritis in her mother. SOCIAL HISTORY She  reports that she quit smoking about 41 years ago. She quit after 10.00 years of use. She has never used smokeless tobacco. She reports that she drinks about 4.0 standard drinks of alcohol per week. She reports that she does not use drugs.   MEDICARE WELLNESS OBJECTIVES: Physical activity:   Cardiac risk factors:   Depression/mood screen:   Depression screen [facility] 2/9 07/15/2017  Decreased Interest 0  Down, Depressed, Hopeless 0  PHQ - 2 Score 0    ADLs:  In your present state of health, do you have any difficulty performing the following activities: 07/29/2017 03/30/2017  Hearing? N N  Vision? N N  Difficulty concentrating or making decisions? N N  Walking or climbing stairs? N N  Dressing or bathing? N N  Doing errands, shopping? - N  Some recent data might be hidden     Cognitive Testing  Alert? Yes  Normal Appearance?Yes  Oriented to person? Yes  Place? Yes   Time? Yes  Recall of three objects?  Yes  Can perform simple calculations? Yes  Displays appropriate judgment?Yes  Can read the correct time from a watch face?Yes  EOL planning: Does Patient Have a  Medical Advance Directive?: Yes Type of Advance Directive: Healthcare Power of Attorney, Living will [place] in Chart?: No - copy requested   Review of Systems: Review of Systems  Constitutional: Negative for chills, fever and malaise/fatigue.  HENT: Negative for congestion, ear pain and sore throat.   Eyes: Negative.   Respiratory: Negative for cough, shortness of breath and wheezing.   Cardiovascular: Negative for chest pain, palpitations and leg swelling.  Gastrointestinal: Negative for abdominal pain, blood in stool, constipation, diarrhea, heartburn and melena.  Genitourinary: Negative.   Musculoskeletal: Positive for falls and joint pain. Negative for back pain, myalgias and neck pain.  Skin: Negative.   Neurological: Negative for dizziness, sensory change, loss of consciousness and headaches.  Psychiatric/Behavioral: Negative for depression. The patient is not nervous/anxious and does not have insomnia.     Physical Exam: Estimated body mass index is 24.29 kg/m as calculated from the following:   Height as of this encounter: 5\' 2"  (1.575 m).   Weight as of this encounter: 132 lb 12.8 oz (60.2 kg). BP (!) 144/72   Pulse 72   Temp 98.9 F (37.2 C)   Resp 14   Ht 5\' 2"  (1.575 m)   Wt 132 lb 12.8 oz (60.2 kg)   SpO2 99%   BMI 24.29 kg/m   General Appearance: Well nourished well developed, in no apparent distress.  Eyes: PERRLA, EOMs, conjunctiva no swelling or erythema ENT/Mouth: Ear canals normal without obstruction, swelling, erythema, or discharge.  TMs normal bilaterally with no erythema, bulging, retraction, or loss of landmark.  Oropharynx moist and clear with no exudate, erythema, or swelling.   Neck: Supple, thyroid normal. No bruits.  No cervical adenopathy Respiratory: Respiratory effort normal, Breath sounds clear A&P without wheeze, rhonchi, rales.   Cardio: RRR without murmurs, rubs or gallops. Brisk peripheral pulses without  edema.  Chest: symmetric, with normal excursions Breasts: defer Abdomen: Soft, nontender, no guarding, rebound, hernias, masses, or organomegaly.  Lymphatics: Non tender without lymphadenopathy.  Genitourinary: defer Musculoskeletal: Full ROM all peripheral extremities,5/5 strength, and antalgic gait, negative straight leg Skin: Warm, dry without rashes, lesions, ecchymosis. Neuro: Awake and oriented X 3, Cranial nerves intact, reflexes equal bilaterally. Normal muscle tone, no cerebellar symptoms. Sensation intact.  Psych:  normal affect, Insight and Judgment appropriate.   Medicare Attestation I have personally reviewed: The patient's medical and social history Their use of alcohol, tobacco or illicit drugs Their current medications and supplements The patient's functional ability including ADLs,fall risks, home safety risks, cognitive, and hearing and visual impairment Diet and physical activities Evidence for depression or mood disorders  The patient's weight, height, BMI, and visual acuity have been recorded in the chart.  I have made referrals, counseling, and provided education to the patient based on review of the above and I have provided the patient with a written personalized care plan for preventive services.  Over 40 minutes of exam, counseling, chart review was performed.   Vicie Mutters 10:08 AM [facility]

## 2017-09-08 NOTE — Progress Notes (Unsigned)
Victoria Long called today to tell us that she really is struggling to take the pill and she would prefer to have radiation.  We are setting her up with that.  She already has an appointment with me mid October.

## 2017-09-08 NOTE — Telephone Encounter (Signed)
This RN spoke with pt per her call stating " problems with taking the medication he prescribed last week "  Pt states she took her first pill on Thurs 8/15 - " no problems except that night I couldn't sleep "  She took her next dose on Friday and that pm " my head felt swimmy and I was nauseated terrible all night and into Saturday "  She has not taken any further doses and symptoms have improved.  Per discussion Tonye does not feel above was a viral issue but related to the pills " I just have never had to take prescription medication and I just don't think I tolerate them well "  " please let Dr Jana Hakim know I would rather take the radiation then the pills "  This note will be given to MD for review and further recommendation.  Next scheduled follow up is 11/04/2017.

## 2017-09-09 ENCOUNTER — Encounter: Payer: Self-pay | Admitting: Radiation Oncology

## 2017-09-10 NOTE — Progress Notes (Signed)
Location of Breast Cancer: Invasive Ductal Carcinoma Left breast  Did patient present with symptoms (if so, please note symptoms) or was this found on screening mammography?: Patient had abnormal mammogram screening 03/23/2017 that showed a mass in her left breast.  Measures 1.6 cm in size.  Mammogram 06/27/2017: There is a suspicious mass in the left breast at 1 o'clock measuring 1.6 cm.  No evidence of left axillary lymphadenopathy.   Histology per Pathology Report:  Left Breast 07/01/17  Receptor Status: ER(+ 100%), PR (+ 60%), Her2-neu (-), Ki-67(50%)    Past/Anticipated interventions by surgeon, if any:  FINAL DIAGNOSIS Diagnosis 07-29-17 Dr. Douglas Blackman Breast, lumpectomy, Left Partial - INVASIVE DUCTAL CARCINOMA, GRADE I/III, SPANNING 1.6 CM. - DUCTAL CARCINOMA IN SITU. - THE SURGICAL RESECTION MARGINS ARE NEGATIVE FOR CARCINOMA. - SEE ONCOLOGY TABLE BELOW. Microscopic Comment INVASIVE CARCINOMA OF THE BREAST: Resection  Receptor Status: ER(+ 100%), PR (+ 60%), Her2-neu (- ratio 1.43), Ki-67(50%)   Dr. Blackman 07/08/2017 - Currently she is a candidate for breast conservation as with her previous right breast cancer.   -We discussed a left breast partial mastectomy. -We will discuss her case in multidisciplinary conference tomorrow, we will determine whether or not she needs a sentinel lymph node biopsy and radiation at her age. -I will call her back after the discussion and then we can plan for surgery.   -Referred to radiation oncology, for evaluation and follow-up.  Surgery scheduled for 07/29/2017: Left Breast Partial Mastectomy  Past/Anticipated interventions by medical oncology, if  any:Dr. Magrinat/ Lindsey Causey NP 09-02-17  consider adjuvant radiation depending on surgical results   antiestrogens to follow  Chemotherapy  Scheduled to see Dr. Magrinat on 07/23/2017 4pm  Lymphedema issues, if any:  None noted ROM to left arm Skin to left  breast Follow up appointment to see Dr. Douglas Blackman   Pain issues, if any: none   SAFETY ISSUES:  Prior radiation? 2012 Right Breast: Whole breast to a dose of 42.5 Gy at 2.5 Gy per fraction, 10 Gy boost to the tumor cavity, with Dr. Moody  Pacemaker/ICD? No  Possible current pregnancy? No  Is the patient on methotrexate? No   No LMP recorded. Patient is postmenopausal. Menarche: 84 years old Age at first live birth: 84 years old She is GXP4. Her LMP was in her early 40's. She did not use HRT or oral contraception.    Current Complaints / other details:               

## 2017-09-11 ENCOUNTER — Ambulatory Visit: Payer: Medicare Other | Admitting: Physician Assistant

## 2017-09-11 ENCOUNTER — Encounter: Payer: Self-pay | Admitting: Physician Assistant

## 2017-09-11 VITALS — BP 144/72 | HR 72 | Temp 98.9°F | Resp 14 | Ht 62.0 in | Wt 132.8 lb

## 2017-09-11 DIAGNOSIS — Z0001 Encounter for general adult medical examination with abnormal findings: Secondary | ICD-10-CM

## 2017-09-11 DIAGNOSIS — I10 Essential (primary) hypertension: Secondary | ICD-10-CM

## 2017-09-11 DIAGNOSIS — C50912 Malignant neoplasm of unspecified site of left female breast: Secondary | ICD-10-CM

## 2017-09-11 DIAGNOSIS — Z79899 Other long term (current) drug therapy: Secondary | ICD-10-CM

## 2017-09-11 DIAGNOSIS — R7303 Prediabetes: Secondary | ICD-10-CM

## 2017-09-11 DIAGNOSIS — R6889 Other general symptoms and signs: Secondary | ICD-10-CM

## 2017-09-11 DIAGNOSIS — Z170 Estrogen receptor positive status [ER+]: Secondary | ICD-10-CM

## 2017-09-11 DIAGNOSIS — Z9181 History of falling: Secondary | ICD-10-CM

## 2017-09-11 DIAGNOSIS — E785 Hyperlipidemia, unspecified: Secondary | ICD-10-CM | POA: Diagnosis not present

## 2017-09-11 DIAGNOSIS — Z6824 Body mass index (BMI) 24.0-24.9, adult: Secondary | ICD-10-CM

## 2017-09-11 DIAGNOSIS — E559 Vitamin D deficiency, unspecified: Secondary | ICD-10-CM

## 2017-09-11 DIAGNOSIS — C50412 Malignant neoplasm of upper-outer quadrant of left female breast: Secondary | ICD-10-CM

## 2017-09-11 DIAGNOSIS — Z853 Personal history of malignant neoplasm of breast: Secondary | ICD-10-CM

## 2017-09-11 DIAGNOSIS — D0361 Melanoma in situ of right upper limb, including shoulder: Secondary | ICD-10-CM

## 2017-09-11 DIAGNOSIS — K8689 Other specified diseases of pancreas: Secondary | ICD-10-CM

## 2017-09-11 DIAGNOSIS — Z0000 Encounter for general adult medical examination without abnormal findings: Secondary | ICD-10-CM

## 2017-09-11 DIAGNOSIS — K869 Disease of pancreas, unspecified: Secondary | ICD-10-CM

## 2017-09-11 MED ORDER — MELOXICAM 7.5 MG PO TABS: ORAL_TABLET | ORAL | 3 refills | Status: DC

## 2017-09-11 NOTE — Patient Instructions (Addendum)
  Mobic is an antiinflammatory It helps pain, can not take with aleve, or ibuprofen You can take tylenol (500mg ) or tylenol arthritis (650mg ) with the meloxicam/antiinflammatories. The max you can take of tylenol a day is 3000mg  daily, this is a max of 6 pills a day of the regular tyelnol (500mg ) or a max of 4 a day of the tylenol arthritis (650mg ) as long as no other medications you are taking contain tylenol.   Mobic can cause inflammation in your stomach and can cause ulcers or bleeding, this will look like black tarry stools Make sure you take your mobic with food Try not to take it daily, take AS needed Can take with zantac  Being a woman you may not have the typical symptoms of a heart attack.  You may not have any pain OR you may have atypical pain such as jaw pain, upper back pain, arm pain, "my bra feels to tight" and you will often have symptoms with it like below.  Symptoms for a heart attack will likely occur when you exert your self or exercise and include: Shortness of breath Sweating Nausea Dizziness Fast or irregular heart beats Fatigue   It makes me feel better if my patients get their heart rate up with exercise once or twice a week and pay close attention to your body. If there is ANY change in your exercise capacity or if you have symptoms above, please STOP and call 911 or call to come to the office.   Here is some information to help you keep your heart healthy: Move it! - Aim for 30 mins of activity every day. Take it slowly at first. Talk to [name] before starting any new exercise program.   Lose it.  -Body Mass Index (BMI) can indicate if you need to lose weight. A healthy range is 18.5-24.9. For a BMI calculator, go to [employer].com  Waist Management -Excess abdominal fat is a risk factor for heart disease, diabetes, asthma, stroke and more. Ideal waist circumference is less than 35" for women and less than 40" for men.   Eat Right -focus on fruits, vegetables,  whole grains, and meals you make yourself. Avoid foods with trans fat and high sugar/sodium content.   Snooze or Snore? - Loud snoring can be a sign of sleep apnea, a significant risk factor for high blood pressure, heart attach, stroke, and heart arrhythmias.  Kick the habit -Quit Smoking! Avoid second hand smoke. A single cigarette raises your blood pressure for 20 mins and increases the risk of heart attack and stroke for the next 24 hours.   Are Aspirin and Supplements right for you? -Add ENTERIC COATED low dose 81 mg Aspirin daily OR can do every other day if you have easy bruising to protect your heart and head. As well as to reduce risk of Colon Cancer by 20 %, Skin Cancer by 26 % , Melanoma by 46% and Pancreatic cancer by 60%  Say "No to Stress -There may be little you can do about problems that cause stress. However, techniques such as long walks, meditation, and exercise can help you manage it.   Start Now! - Make changes one at a time and set reasonable goals to increase your likelihood of success.

## 2017-09-12 LAB — CBC WITH DIFFERENTIAL/PLATELET
BASOS PCT: 0.8 %
Basophils Absolute: 50 cells/uL (ref 0–200)
EOS PCT: 2.7 %
Eosinophils Absolute: 167 cells/uL (ref 15–500)
HEMATOCRIT: 38.5 % (ref 35.0–45.0)
HEMOGLOBIN: 12.8 g/dL (ref 11.7–15.5)
LYMPHS ABS: 1872 {cells}/uL (ref 850–3900)
MCH: 31.8 pg (ref 27.0–33.0)
MCHC: 33.2 g/dL (ref 32.0–36.0)
MCV: 95.8 fL (ref 80.0–100.0)
MPV: 11.2 fL (ref 7.5–12.5)
Monocytes Relative: 9.6 %
NEUTROS ABS: 3515 {cells}/uL (ref 1500–7800)
NEUTROS PCT: 56.7 %
Platelets: 244 10*3/uL (ref 140–400)
RBC: 4.02 10*6/uL (ref 3.80–5.10)
RDW: 11.9 % (ref 11.0–15.0)
Total Lymphocyte: 30.2 %
WBC mixed population: 595 cells/uL (ref 200–950)
WBC: 6.2 10*3/uL (ref 3.8–10.8)

## 2017-09-12 LAB — COMPLETE METABOLIC PANEL WITH GFR
AG Ratio: 2 (calc) (ref 1.0–2.5)
ALT: 15 U/L (ref 6–29)
AST: 18 U/L (ref 10–35)
Albumin: 4.2 g/dL (ref 3.6–5.1)
Alkaline phosphatase (APISO): 50 U/L (ref 33–130)
BILIRUBIN TOTAL: 0.6 mg/dL (ref 0.2–1.2)
BUN: 17 mg/dL (ref 7–25)
CHLORIDE: 107 mmol/L (ref 98–110)
CO2: 29 mmol/L (ref 20–32)
Calcium: 9.8 mg/dL (ref 8.6–10.4)
Creat: 0.65 mg/dL (ref 0.60–0.88)
GFR, Est African American: 93 mL/min/{1.73_m2} (ref >=60)
GFR, Est Non African American: 80 mL/min/{1.73_m2} (ref >=60)
GLOBULIN: 2.1 g/dL (ref 1.9–3.7)
Glucose, Bld: 90 mg/dL (ref 65–99)
POTASSIUM: 4.5 mmol/L (ref 3.5–5.3)
SODIUM: 143 mmol/L (ref 135–146)
Total Protein: 6.3 g/dL (ref 6.1–8.1)

## 2017-09-12 LAB — HEMOGLOBIN A1C
EAG (MMOL/L): 6.2 (calc)
Hgb A1c MFr Bld: 5.5 % of total Hgb (ref <5.7)
Mean Plasma Glucose: 111 (calc)

## 2017-09-12 LAB — TSH: TSH: 1.92 mIU/L (ref 0.40–4.50)

## 2017-09-12 LAB — LIPID PANEL
CHOLESTEROL: 225 mg/dL — AB (ref <200)
HDL: 72 mg/dL (ref >50)
LDL Cholesterol (Calc): 134 mg/dL (calc) — ABNORMAL HIGH
Non-HDL Cholesterol (Calc): 153 mg/dL (calc) — ABNORMAL HIGH (ref <130)
Total CHOL/HDL Ratio: 3.1 (calc) (ref <5.0)
Triglycerides: 86 mg/dL (ref <150)

## 2017-09-16 ENCOUNTER — Ambulatory Visit
Admission: RE | Admit: 2017-09-16 | Discharge: 2017-09-16 | Disposition: A | Discharge status: Home / Self Care | Payer: Medicare Other | Source: Ambulatory Visit | Attending: Radiation Oncology | Admitting: Radiation Oncology

## 2017-09-16 ENCOUNTER — Encounter: Payer: Self-pay | Admitting: Radiation Oncology

## 2017-09-16 ENCOUNTER — Other Ambulatory Visit: Payer: Self-pay

## 2017-09-16 VITALS — BP 166/89 | HR 81 | Temp 98.1°F | Resp 20 | Ht 62.0 in | Wt 132.0 lb

## 2017-09-16 DIAGNOSIS — R7303 Prediabetes: Secondary | ICD-10-CM | POA: Diagnosis not present

## 2017-09-16 DIAGNOSIS — Z85820 Personal history of malignant melanoma of skin: Secondary | ICD-10-CM | POA: Insufficient documentation

## 2017-09-16 DIAGNOSIS — Z510 Encounter for antineoplastic radiation therapy: Secondary | ICD-10-CM | POA: Insufficient documentation

## 2017-09-16 DIAGNOSIS — C50912 Malignant neoplasm of unspecified site of left female breast: Secondary | ICD-10-CM

## 2017-09-16 DIAGNOSIS — Z923 Personal history of irradiation: Secondary | ICD-10-CM | POA: Insufficient documentation

## 2017-09-16 DIAGNOSIS — Z853 Personal history of malignant neoplasm of breast: Secondary | ICD-10-CM | POA: Insufficient documentation

## 2017-09-16 DIAGNOSIS — Z170 Estrogen receptor positive status [ER+]: Secondary | ICD-10-CM | POA: Insufficient documentation

## 2017-09-16 DIAGNOSIS — Z888 Allergy status to other drugs, medicaments and biological substances status: Secondary | ICD-10-CM | POA: Diagnosis not present

## 2017-09-16 DIAGNOSIS — Z79899 Other long term (current) drug therapy: Secondary | ICD-10-CM | POA: Insufficient documentation

## 2017-09-16 DIAGNOSIS — Z87891 Personal history of nicotine dependence: Secondary | ICD-10-CM | POA: Diagnosis not present

## 2017-09-16 DIAGNOSIS — E785 Hyperlipidemia, unspecified: Secondary | ICD-10-CM | POA: Diagnosis not present

## 2017-09-16 DIAGNOSIS — Z08 Encounter for follow-up examination after completed treatment for malignant neoplasm: Secondary | ICD-10-CM | POA: Diagnosis present

## 2017-09-16 DIAGNOSIS — C50412 Malignant neoplasm of upper-outer quadrant of left female breast: Secondary | ICD-10-CM | POA: Diagnosis present

## 2017-09-16 DIAGNOSIS — Z9012 Acquired absence of left breast and nipple: Secondary | ICD-10-CM | POA: Diagnosis not present

## 2017-09-16 NOTE — Progress Notes (Signed)
  Radiation Oncology         [phone] ________________________________  Name: Victoria Long MRN: [id number]  Date: 09/16/2017  DOB: 07-27-1928  Optical Surface Tracking Plan:  Since intensity modulated radiotherapy (IMRT) and 3D conformal radiation treatment methods are predicated on accurate and precise positioning for treatment, intrafraction motion monitoring is medically necessary to ensure accurate and safe treatment delivery.  The ability to quantify intrafraction motion without excessive ionizing radiation dose can only be performed with optical surface tracking. Accordingly, surface imaging offers the opportunity to obtain 3D measurements of patient position throughout IMRT and 3D treatments without excessive radiation exposure.  I am ordering optical surface tracking for this patient's upcoming course of radiotherapy. ________________________________  Kyung Rudd, MD 09/16/2017 5:47 PM    Reference:   Particia Jasper, et al. Surface imaging-based analysis of intrafraction motion for breast radiotherapy patients.Journal of [place], n. 6, nov. 2014. ISSN [id number].   Available at: <http://www.jacmp.org/index.php/jacmp/article/view/4957>.

## 2017-09-16 NOTE — Progress Notes (Signed)
  Radiation Oncology         [phone] ________________________________  Name: ZOANNE NEWILL MRN: [id number]  Date: 09/16/2017  DOB: 1928-06-13   DIAGNOSIS:     ICD-10-CM   1. Malignant neoplasm of upper-outer quadrant of left breast in female, estrogen receptor positive ([place]) C50.412    Z17.0     SIMULATION AND TREATMENT PLANNING NOTE  The patient presented for simulation prior to beginning her course of radiation treatment for her diagnosis of left-sided breast cancer. The patient was placed in a supine position on a breast board. A customized vac-lock bag was constructed and this complex treatment device will be used on a daily basis during her treatment. In this fashion, a CT scan was obtained through the chest area and an isocenter was placed near the chest wall within the breast.  The patient will be planned to receive a course of radiation initially to a dose of 42.56 Gy. This will consist of a whole breast radiotherapy technique. To accomplish this, 2 customized blocks have been designed which will correspond to medial and lateral whole breast tangent fields. This treatment will be accomplished at 2.66 Gy per fraction. A forward planning technique will also be evaluated to determine if this approach improves the plan. It is anticipated that the patient will then receive a 8 Gy boost to the seroma cavity which has been contoured. This will be accomplished at 2 Gy per fraction.   This initial treatment will consist of a 3-D conformal technique. The seroma has been contoured as the primary target structure. Additionally, dose volume histograms of both this target as well as the lungs and heart will also be evaluated. Such an approach is necessary to ensure that the target area is adequately covered while the nearby critical  normal structures are adequately spared.  Plan:  The final anticipated total dose therefore will correspond to 50.56  Gy.    _______________________________   Jodelle Gross, MD, PhD

## 2017-09-17 NOTE — Progress Notes (Signed)
Radiation Oncology         ([phone]) [phone] ________________________________  Name: Victoria Long        MRN: [id number]  Date of Service: 09/16/2017 DOB: August 11, 1928  CC:Unk Pinto, MD  Magrinat, [name], MD     REFERRING PHYSICIAN: Magrinat, [name], MD    DIAGNOSIS: The primary encounter diagnosis was Invasive ductal carcinoma of breast, female, left ([place]). A diagnosis of History of right breast cancer was also pertinent to this visit.   HISTORY OF PRESENT ILLNESS: Victoria Long is a 84 y.o. female seen at the request of Dr. Jana Hakim for a history of Stage IA, pT1bN0M0 grade 1 ER/PR positive invasive ductal carcinoma of the right breast s/p lumpectomy and radiotherapy who was released from medical oncology care in 2016. She did not take antiestrogen therapy. She was found on a recent mammogram on the left breast to have a mass in the upper outer quadrant. On ultrasound this measured 1.6 x 1 x 1.2 cm, and her axilla was negative for adenopathy. A biopsy on 07/01/17 revealed a grade 1 ER/PR positive, HER2 negative invasive ductal carcinoma, and her Ki 67 was 50%.  She underwent left lumpectomy on 07/29/2017 revealing a grade 1, 1.6 cm invasive ductal carcinoma with DCIS.  Her margins were negative, and no lymph nodes were sampled.  Oncotype will not be performed, and she comes today to discuss the options of adjuvant radiotherapy versus antiestrogen therapy.  PREVIOUS RADIATION THERAPY: Yes   08/13/10-09/10/10:  42.5 Gy to the right breast and 10 Gy boost to the lumpectomy cavity to total 52.5 Gy with Dr. Lisbeth Renshaw   PAST MEDICAL HISTORY:  Past Medical History:  Diagnosis Date  . Arthritis    knees  . Breast cancer ([place]) 06/2017   left breast  . Cancer ([place])    right breast cancer  . Hyperlipidemia   . Melanoma ([place]) 2014   right humerus  . Personal history of radiation therapy 2012  . Prediabetes   . Vitamin D deficiency        PAST SURGICAL HISTORY: Past Surgical History:    Procedure Laterality Date  . BREAST BIOPSY Right 05/28/2010  . BREAST LUMPECTOMY Right 06/29/2010   with radiation no chemo  . CATARACT EXTRACTION Bilateral 2009  . CESAREAN SECTION    . MASTECTOMY, PARTIAL Left 07/29/2017   Procedure: LEFT BREAST PARTIAL MASTECTOMY;  Surgeon: [name], MD;  Location: [facility];  Service: General;  Laterality: Left;  Marland Kitchen MELANOMA EXCISION Right 2014   humerus     FAMILY HISTORY:  Family History  Problem Relation Age of Onset  . Arthritis Mother   . Rheum arthritis Mother   . Hypertension Sister   . Cancer Sister        type unknown  . Diabetes Maternal Grandmother      SOCIAL HISTORY:  reports that she quit smoking about 41 years ago. She quit after 10.00 years of use. She has never used smokeless tobacco. She reports that she drinks about 4.0 standard drinks of alcohol per week. She reports that she does not use drugs.  The patient is widowed and lives in [place].  She is accompanied by her daughter.  ALLERGIES: Anastrozole and Tramadol   MEDICATIONS:  Current Outpatient Medications  Medication Sig Dispense Refill  . calcium carbonate (OS-CAL) 600 MG TABS Take 600 mg by mouth 2 (two) times daily with a meal.     . Cholecalciferol (VITAMIN D) 1000 UNITS capsule  Take 1,000 Units by mouth 2 (two) times daily.     Marland Kitchen LYSINE HCL PO Take 500 mg by mouth daily.     . meloxicam (MOBIC) 7.5 MG tablet Take 1 pill up to twice daily for pain, can take with tylenol, can not take with aleve, iburpofen, then as needed daily for pain 60 tablet 3  . Multiple Vitamins-Minerals (MULTIVITAMIN PO) Take by mouth daily.    . Omega-3 Fatty Acids (FISH OIL PO) Take by mouth daily. Mega Red    . ibuprofen (ADVIL,MOTRIN) 200 MG tablet Take 200 mg by mouth 4 (four) times daily.     No current facility-administered medications for this encounter.      REVIEW OF SYSTEMS: On review of systems, the patient reports that she is doing well  overall.  She reports she is doing very well overall.  She denies any concerns with her healing at this point. She denies any chest pain, shortness of breath, cough, fevers, chills, night sweats, unintended weight changes. She denies any bowel or bladder disturbances, and denies abdominal pain, nausea or vomiting. She denies any new musculoskeletal or joint aches or pains. A complete review of systems is obtained and is otherwise negative.     PHYSICAL EXAM:  Wt Readings from Last 3 Encounters:  09/16/17 132 lb (59.9 kg)  09/11/17 132 lb 12.8 oz (60.2 kg)  09/02/17 132 lb 4.8 oz (60 kg)   Temp Readings from Last 3 Encounters:  09/16/17 98.1 F (36.7 C) (Oral)  09/11/17 98.9 F (37.2 C)  09/02/17 98.1 F (36.7 C) (Oral)   BP Readings from Last 3 Encounters:  09/16/17 (!) 166/89  09/11/17 (!) 144/72  09/02/17 (!) 146/67   Pulse Readings from Last 3 Encounters:  09/16/17 81  09/11/17 72  09/02/17 73   Pain Assessment Pain Score: 0-No pain/10  In general this is a well appearing caucasian female in no acute distress. She's alert and oriented x4 and appropriate throughout the examination. Cardiopulmonary assessment is negative for acute distress and she exhibits normal effort.  Breast exam is deferred.   ECOG = 0  0 - Asymptomatic (Fully active, able to carry on all predisease activities without restriction)  1 - Symptomatic but completely ambulatory (Restricted in physically strenuous activity but ambulatory and able to carry out work of a light or sedentary nature. For example, light housework, office work)  2 - Symptomatic, <50% in bed during the day (Ambulatory and capable of all self care but unable to carry out any work activities. Up and about more than 50% of waking hours)  3 - Symptomatic, >50% in bed, but not bedbound (Capable of only limited self-care, confined to bed or chair 50% or more of waking hours)  4 - Bedbound (Completely disabled. Cannot carry on any  self-care. Totally confined to bed or chair)  5 - Death   Eustace Pen MM, Creech RH, Tormey DC, et al. [phone]). "Toxicity and response criteria of the [facility]". [place] Oncol. 5 (6): 649-55    LABORATORY DATA:  Lab Results  Component Value Date   WBC 6.2 09/11/2017   HGB 12.8 09/11/2017   HCT 38.5 09/11/2017   MCV 95.8 09/11/2017   PLT 244 09/11/2017   Lab Results  Component Value Date   NA 143 09/11/2017   K 4.5 09/11/2017   CL 107 09/11/2017   CO2 29 09/11/2017   Lab Results  Component Value Date   ALT 15 09/11/2017  AST 18 09/11/2017   ALKPHOS 60 07/23/2017   BILITOT 0.6 09/11/2017      RADIOGRAPHY: No results found.     IMPRESSION/PLAN: 1. Stage IA, pT1c, cN0M0 grade 1 ER/PR positive invasive ductal carcinoma of the left breast. Dr. Lisbeth Renshaw discusses the final pathology findings and reviews the nature of left breast disease.  We discussed the limitations of how she was unable to tolerate antiestrogen therapy in the past, and hence would recommend that she consider local therapy if she is not interested in considering a rechallenge of antiestrogen therapy. We discussed the risks, benefits, short, and long term effects of radiotherapy, and the patient is interested in proceeding. Dr. Lisbeth Renshaw discusses the delivery and logistics of radiotherapy and anticipates a course of 4  weeks of radiotherapy with deep inspiration breath hold technique. Written consent is obtained and placed in the chart, a copy was provided to the patient.  She will follow today's appointment with simulation. 2. Stage IA, cT1bN0M0 grade 1 ER/PR positive invasive ductal carcinoma of the right breast. She will continue with surveillance when undergoing mammography for the left breast and will follow with Dr. Jana Hakim as well.    In a visit lasting 30 minutes, greater than 50% of the time was spent face to face discussing her case, and coordinating the patient's care.   The above  documentation reflects my direct findings during this shared patient visit. Please see the separate note by Dr. Lisbeth Renshaw on this date for the remainder of the patient's plan of care.    Carola Rhine, PAC

## 2017-09-19 DIAGNOSIS — C50412 Malignant neoplasm of upper-outer quadrant of left female breast: Secondary | ICD-10-CM | POA: Diagnosis not present

## 2017-09-24 ENCOUNTER — Ambulatory Visit
Admission: RE | Admit: 2017-09-24 | Discharge: 2017-09-24 | Disposition: A | Discharge status: Home / Self Care | Payer: Medicare Other | Source: Ambulatory Visit | Attending: Radiation Oncology | Admitting: Radiation Oncology

## 2017-09-24 DIAGNOSIS — Z170 Estrogen receptor positive status [ER+]: Secondary | ICD-10-CM | POA: Diagnosis not present

## 2017-09-24 DIAGNOSIS — Z510 Encounter for antineoplastic radiation therapy: Secondary | ICD-10-CM | POA: Diagnosis not present

## 2017-09-24 DIAGNOSIS — C50412 Malignant neoplasm of upper-outer quadrant of left female breast: Secondary | ICD-10-CM | POA: Diagnosis not present

## 2017-09-25 ENCOUNTER — Ambulatory Visit
Admission: RE | Admit: 2017-09-25 | Discharge: 2017-09-25 | Disposition: A | Discharge status: Home / Self Care | Payer: Medicare Other | Source: Ambulatory Visit | Attending: Radiation Oncology | Admitting: Radiation Oncology

## 2017-09-25 DIAGNOSIS — C50412 Malignant neoplasm of upper-outer quadrant of left female breast: Secondary | ICD-10-CM | POA: Diagnosis not present

## 2017-09-26 ENCOUNTER — Ambulatory Visit
Admission: RE | Admit: 2017-09-26 | Discharge: 2017-09-26 | Disposition: A | Discharge status: Home / Self Care | Payer: Medicare Other | Source: Ambulatory Visit | Attending: Radiation Oncology | Admitting: Radiation Oncology

## 2017-09-26 DIAGNOSIS — Z170 Estrogen receptor positive status [ER+]: Principal | ICD-10-CM

## 2017-09-26 DIAGNOSIS — C50412 Malignant neoplasm of upper-outer quadrant of left female breast: Secondary | ICD-10-CM

## 2017-09-26 MED ORDER — RADIAPLEXRX EX GEL
Freq: Two times a day (BID) | CUTANEOUS | Status: DC
  Administered 2017-09-26: 17:00:00 via TOPICAL

## 2017-09-29 ENCOUNTER — Ambulatory Visit
Admission: RE | Admit: 2017-09-29 | Discharge: 2017-09-29 | Disposition: A | Discharge status: Home / Self Care | Payer: Medicare Other | Source: Ambulatory Visit | Attending: Radiation Oncology | Admitting: Radiation Oncology

## 2017-09-29 ENCOUNTER — Ambulatory Visit: Payer: Medicare Other

## 2017-09-29 DIAGNOSIS — C50412 Malignant neoplasm of upper-outer quadrant of left female breast: Secondary | ICD-10-CM | POA: Diagnosis not present

## 2017-09-30 ENCOUNTER — Ambulatory Visit
Admission: RE | Admit: 2017-09-30 | Discharge: 2017-09-30 | Disposition: A | Discharge status: Home / Self Care | Payer: Medicare Other | Source: Ambulatory Visit | Attending: Radiation Oncology | Admitting: Radiation Oncology

## 2017-09-30 DIAGNOSIS — C50412 Malignant neoplasm of upper-outer quadrant of left female breast: Secondary | ICD-10-CM | POA: Diagnosis not present

## 2017-10-01 ENCOUNTER — Ambulatory Visit
Admission: RE | Admit: 2017-10-01 | Discharge: 2017-10-01 | Disposition: A | Discharge status: Home / Self Care | Payer: Medicare Other | Source: Ambulatory Visit | Attending: Radiation Oncology | Admitting: Radiation Oncology

## 2017-10-01 DIAGNOSIS — C50412 Malignant neoplasm of upper-outer quadrant of left female breast: Secondary | ICD-10-CM | POA: Diagnosis not present

## 2017-10-02 ENCOUNTER — Telehealth: Payer: Self-pay | Admitting: *Deleted

## 2017-10-02 ENCOUNTER — Ambulatory Visit
Admission: RE | Admit: 2017-10-02 | Discharge: 2017-10-02 | Disposition: A | Discharge status: Home / Self Care | Payer: Medicare Other | Source: Ambulatory Visit | Attending: Radiation Oncology | Admitting: Radiation Oncology

## 2017-10-02 DIAGNOSIS — C50412 Malignant neoplasm of upper-outer quadrant of left female breast: Secondary | ICD-10-CM | POA: Diagnosis not present

## 2017-10-02 NOTE — Telephone Encounter (Signed)
Pt left message x 2 inquiring " do I need to still have the bone density test scheduled next week since I am not taking that pill "  Note pt was started on anastrazole - which was then discontinued shortly after starting due to side effects. Pt will be undergoing radiation to her breast.  Per Epic - last noted bone density was 2011, per MD concern pt should obtain a bone density for general health follow up bone health and pt is at risk for falls.  This RN attempted to return call to given number x 2 with number ringing multiple times then regarding stating to call again.

## 2017-10-03 ENCOUNTER — Ambulatory Visit
Admission: RE | Admit: 2017-10-03 | Discharge: 2017-10-03 | Disposition: A | Discharge status: Home / Self Care | Payer: Medicare Other | Source: Ambulatory Visit | Attending: Radiation Oncology | Admitting: Radiation Oncology

## 2017-10-03 DIAGNOSIS — C50412 Malignant neoplasm of upper-outer quadrant of left female breast: Secondary | ICD-10-CM | POA: Diagnosis not present

## 2017-10-06 ENCOUNTER — Ambulatory Visit
Admission: RE | Admit: 2017-10-06 | Discharge: 2017-10-06 | Disposition: A | Discharge status: Home / Self Care | Payer: Medicare Other | Source: Ambulatory Visit | Attending: Radiation Oncology | Admitting: Radiation Oncology

## 2017-10-06 DIAGNOSIS — C50412 Malignant neoplasm of upper-outer quadrant of left female breast: Secondary | ICD-10-CM | POA: Diagnosis not present

## 2017-10-07 ENCOUNTER — Ambulatory Visit
Admission: RE | Admit: 2017-10-07 | Discharge: 2017-10-07 | Disposition: A | Discharge status: Home / Self Care | Payer: Medicare Other | Source: Ambulatory Visit | Attending: Radiation Oncology | Admitting: Radiation Oncology

## 2017-10-07 DIAGNOSIS — C50412 Malignant neoplasm of upper-outer quadrant of left female breast: Secondary | ICD-10-CM | POA: Diagnosis not present

## 2017-10-08 ENCOUNTER — Ambulatory Visit
Admission: RE | Admit: 2017-10-08 | Discharge: 2017-10-08 | Disposition: A | Discharge status: Home / Self Care | Payer: Medicare Other | Source: Ambulatory Visit | Attending: Radiation Oncology | Admitting: Radiation Oncology

## 2017-10-08 DIAGNOSIS — C50412 Malignant neoplasm of upper-outer quadrant of left female breast: Secondary | ICD-10-CM | POA: Diagnosis not present

## 2017-10-09 ENCOUNTER — Ambulatory Visit
Admission: RE | Admit: 2017-10-09 | Discharge: 2017-10-09 | Disposition: A | Discharge status: Home / Self Care | Payer: Medicare Other | Source: Ambulatory Visit | Attending: Radiation Oncology | Admitting: Radiation Oncology

## 2017-10-09 DIAGNOSIS — C50412 Malignant neoplasm of upper-outer quadrant of left female breast: Secondary | ICD-10-CM | POA: Diagnosis not present

## 2017-10-10 ENCOUNTER — Ambulatory Visit
Admission: RE | Admit: 2017-10-10 | Discharge: 2017-10-10 | Disposition: A | Discharge status: Home / Self Care | Payer: Medicare Other | Source: Ambulatory Visit | Attending: Radiation Oncology | Admitting: Radiation Oncology

## 2017-10-10 DIAGNOSIS — C50412 Malignant neoplasm of upper-outer quadrant of left female breast: Secondary | ICD-10-CM | POA: Diagnosis not present

## 2017-10-13 ENCOUNTER — Ambulatory Visit
Admission: RE | Admit: 2017-10-13 | Discharge: 2017-10-13 | Disposition: A | Discharge status: Home / Self Care | Payer: Medicare Other | Source: Ambulatory Visit | Attending: Radiation Oncology | Admitting: Radiation Oncology

## 2017-10-13 DIAGNOSIS — C50412 Malignant neoplasm of upper-outer quadrant of left female breast: Secondary | ICD-10-CM | POA: Diagnosis not present

## 2017-10-14 ENCOUNTER — Ambulatory Visit
Admission: RE | Admit: 2017-10-14 | Discharge: 2017-10-14 | Disposition: A | Discharge status: Home / Self Care | Payer: Medicare Other | Source: Ambulatory Visit | Attending: Radiation Oncology | Admitting: Radiation Oncology

## 2017-10-14 DIAGNOSIS — C50412 Malignant neoplasm of upper-outer quadrant of left female breast: Secondary | ICD-10-CM | POA: Diagnosis not present

## 2017-10-15 ENCOUNTER — Ambulatory Visit
Admission: RE | Admit: 2017-10-15 | Discharge: 2017-10-15 | Disposition: A | Discharge status: Home / Self Care | Payer: Medicare Other | Source: Ambulatory Visit | Attending: Radiation Oncology | Admitting: Radiation Oncology

## 2017-10-15 ENCOUNTER — Other Ambulatory Visit: Payer: Medicare Other

## 2017-10-15 DIAGNOSIS — C50412 Malignant neoplasm of upper-outer quadrant of left female breast: Secondary | ICD-10-CM | POA: Diagnosis not present

## 2017-10-16 ENCOUNTER — Ambulatory Visit
Admission: RE | Admit: 2017-10-16 | Discharge: 2017-10-16 | Disposition: A | Discharge status: Home / Self Care | Payer: Medicare Other | Source: Ambulatory Visit | Attending: Radiation Oncology | Admitting: Radiation Oncology

## 2017-10-16 DIAGNOSIS — C50412 Malignant neoplasm of upper-outer quadrant of left female breast: Secondary | ICD-10-CM | POA: Diagnosis not present

## 2017-10-17 ENCOUNTER — Ambulatory Visit
Admission: RE | Admit: 2017-10-17 | Discharge: 2017-10-17 | Disposition: A | Discharge status: Home / Self Care | Payer: Medicare Other | Source: Ambulatory Visit | Attending: Radiation Oncology | Admitting: Radiation Oncology

## 2017-10-17 ENCOUNTER — Ambulatory Visit: Payer: Medicare Other | Admitting: Radiation Oncology

## 2017-10-17 DIAGNOSIS — C50412 Malignant neoplasm of upper-outer quadrant of left female breast: Secondary | ICD-10-CM | POA: Diagnosis not present

## 2017-10-20 ENCOUNTER — Ambulatory Visit
Admission: RE | Admit: 2017-10-20 | Discharge: 2017-10-20 | Disposition: A | Discharge status: Home / Self Care | Payer: Medicare Other | Source: Ambulatory Visit | Attending: Radiation Oncology | Admitting: Radiation Oncology

## 2017-10-20 DIAGNOSIS — C50412 Malignant neoplasm of upper-outer quadrant of left female breast: Secondary | ICD-10-CM | POA: Diagnosis not present

## 2017-10-21 ENCOUNTER — Ambulatory Visit
Admission: RE | Admit: 2017-10-21 | Discharge: 2017-10-21 | Disposition: A | Discharge status: Home / Self Care | Payer: Medicare Other | Source: Ambulatory Visit | Attending: Radiation Oncology | Admitting: Radiation Oncology

## 2017-10-21 DIAGNOSIS — C50412 Malignant neoplasm of upper-outer quadrant of left female breast: Secondary | ICD-10-CM | POA: Insufficient documentation

## 2017-10-21 DIAGNOSIS — Z170 Estrogen receptor positive status [ER+]: Secondary | ICD-10-CM | POA: Insufficient documentation

## 2017-10-21 DIAGNOSIS — Z510 Encounter for antineoplastic radiation therapy: Secondary | ICD-10-CM | POA: Diagnosis not present

## 2017-10-22 ENCOUNTER — Ambulatory Visit: Payer: Medicare Other

## 2017-10-22 ENCOUNTER — Ambulatory Visit
Admission: RE | Admit: 2017-10-22 | Discharge: 2017-10-22 | Disposition: A | Discharge status: Home / Self Care | Payer: Medicare Other | Source: Ambulatory Visit | Attending: Radiation Oncology | Admitting: Radiation Oncology

## 2017-10-22 DIAGNOSIS — C50412 Malignant neoplasm of upper-outer quadrant of left female breast: Secondary | ICD-10-CM | POA: Diagnosis not present

## 2017-10-23 ENCOUNTER — Ambulatory Visit: Payer: Medicare Other

## 2017-10-24 ENCOUNTER — Ambulatory Visit: Payer: Medicare Other

## 2017-10-27 ENCOUNTER — Ambulatory Visit: Payer: Medicare Other

## 2017-10-28 ENCOUNTER — Ambulatory Visit: Payer: Medicare Other

## 2017-10-29 ENCOUNTER — Ambulatory Visit: Payer: Medicare Other

## 2017-10-30 ENCOUNTER — Ambulatory Visit: Payer: Medicare Other

## 2017-10-31 ENCOUNTER — Ambulatory Visit: Payer: Medicare Other

## 2017-10-31 NOTE — Progress Notes (Signed)
Victoria Long  Telephone:(336) [phone] Fax:(336) [phone]     ID: Victoria Long DOB: December 30, 1928  MR#: [id number]  POE#:[id number]  Patient Care Team: Victoria Pinto, MD as PCP - General (Internal Medicine) Victoria Mayer, MD as Consulting Physician (Gastroenterology) Victoria Pho, MD as Referring Physician (Orthopedic Surgery) Victoria Bookbinder, MD as Consulting Physician (Dermatology) Long, Victoria Dad, MD as Consulting Physician (Oncology) Victoria Miner, MD as Consulting Physician (Dermatology) Victoria Keens, MD as Consulting Physician (General Surgery) Shawnie Dapper OTHER MD:  CHIEF COMPLAINT: Estrogen receptor positive breast cancer  CURRENT TREATMENT: Observation   HISTORY OF CURRENT ILLNESS: From the original intake note:  Victoria Long had routine screening mammography on 06/23/2017 showing a possible abnormality in the left breast. She underwent unilateral left diagnostic mammography with tomography and left breast ultrasonography at [facility] on 06/27/2017 showing: breast density category B. There was a mass at 1 o'clock in the left breast upper outer quadrant measuring 1.6 x 1.0 x 1.2 cm and located 4 cm from the nipple. There was no evidence of left axillary lymphadenopathy.  Accordingly on 07/01/2017 she proceeded to biopsy of the left breast area in question. The pathology from this procedure showed ([id number]): Invasive ductal carcinoma grade I. Prognostic indicators significant for: estrogen receptor, 100% positive and progesterone receptor, 60% positive, both with strong staining intensity. Proliferation marker Ki67 at 50% . HER2 not amplified with ratios HER2/CEP17 signals 1.43 and average HER2 copies per cell 3.30  Note that she also has a history of right-sided breast cancer status post right lumpectomy and sentinel lymph node sampling 06/29/2010, for a 0.6 cm invasive ductal carcinoma, grade 1, present at the anterior margin, with  both sentinel lymph nodes negative.  The tumor was estrogen and progesterone receptor strongly positive, HER-2 not amplified, with an MIB-104, and was treated adjuvantly with radiation alone.  The patient's subsequent history is as detailed below.  INTERVAL HISTORY: Victoria Long is here today for follow up of her estrogen positive breast cancer. She completed radiation treatments on 10/22/2017. She notes that her nipple is sore with some fluid leakage. She also had some redness in the affected areas, but no skin pealing. She has been using some hydrocortisone creme to aid this.   She discontinued taking anastrozole due to dizziness and wooziness after taking it less than a week.   REVIEW OF SYSTEMS: Victoria Long takes tylenol or meloxicam as needed for any pain. She denies unusual headaches, visual changes, nausea, vomiting, or dizziness. There has been no unusual cough, phlegm production, or pleurisy. There has been no change in bowel or bladder habits. She denies unexplained fatigue or unexplained weight loss, bleeding, rash, or fever. A detailed review of systems was otherwise stable.    PAST MEDICAL HISTORY: Past Medical History:  Diagnosis Date  . Arthritis    knees  . Breast cancer ([place]) 06/2017   left breast  . Cancer ([place])    right breast cancer  . Hyperlipidemia   . Melanoma ([place]) 2014   right humerus  . Personal history of radiation therapy 2012  . Prediabetes   . Vitamin D deficiency     PAST SURGICAL HISTORY: Past Surgical History:  Procedure Laterality Date  . BREAST BIOPSY Right 05/28/2010  . BREAST LUMPECTOMY Right 06/29/2010   with radiation no chemo  . CATARACT EXTRACTION Bilateral 2009  . CESAREAN SECTION    . MASTECTOMY, PARTIAL Left 07/29/2017   Procedure: LEFT BREAST PARTIAL MASTECTOMY;  Surgeon: Victoria Keens, MD;  Location: [facility];  Service: General;  Laterality: Left;  Marland Kitchen MELANOMA EXCISION Right 2014   humerus  Lipoma removed from right  arm   FAMILY HISTORY Family History  Problem Relation Age of Onset  . Arthritis Mother   . Rheum arthritis Mother   . Hypertension Sister   . Cancer Sister        type unknown  . Diabetes Maternal Grandmother   The patient's mother died at age 10 due to RA. The patient's father died due to heart issues. The patient had 1 brother and 2 sisters. She denies a family history of breast or ovarian cancer.  GYNECOLOGIC HISTORY:  No LMP recorded. Patient is postmenopausal. Menarche: 84 years old Age at first live birth: 84 years old She is GXP4. Her LMP was in her early 34's. She did not use HRT or oral contraception.    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 She is widowed twice over. At home is just herself with no pets. The patient's oldest son, Victoria Long lives in [place] as a Forensic scientist. The patient's second is son, Victoria Long who lives in [place] as a Development worker, international aid. The patient's third child is Victoria Crumble "Harrie Jeans" who works in Press photographer in [place]. The patient's youngest child is son, Victoria Baltimore "Mikki Santee" who lives in [place] in business. The patient has 10 grandchildren- who are all college graduates. She also has 6 great-grandchildren. The patient belongs to [place] [employer] in [place].     ADVANCED DIRECTIVES: Victoria Long intends to name her son, Victoria Reichmann "Elta Guadeloupe" as her HCPOA   HEALTH MAINTENANCE: Social History   Tobacco Use  . Smoking status: Former Smoker    Years: 10.00    Last attempt to quit: 08/21/1976    Years since quitting: 41.2  . Smokeless tobacco: Never Used  Substance Use Topics  . Alcohol use: Yes    Alcohol/week: 4.0 standard drinks    Types: 4 Standard drinks or equivalent per week    Comment: wine  . Drug use: No     Colonoscopy: UTD  PAP:  Bone density: recent/ normal    Allergies  Allergen Reactions  . Anastrozole   . Tramadol     Medication caused dizziness    Current Outpatient  Medications  Medication Sig Dispense Refill  . calcium carbonate (OS-CAL) 600 MG TABS Take 600 mg by mouth 2 (two) times daily with a meal.     . Cholecalciferol (VITAMIN D) 1000 UNITS capsule Take 1,000 Units by mouth 2 (two) times daily.     Marland Kitchen ibuprofen (ADVIL,MOTRIN) 200 MG tablet Take 200 mg by mouth 4 (four) times daily.    Marland Kitchen LYSINE HCL PO Take 500 mg by mouth daily.     . meloxicam (MOBIC) 7.5 MG tablet Take 1 pill up to twice daily for pain, can take with tylenol, can not take with aleve, iburpofen, then as needed daily for pain 60 tablet 3  . Multiple Vitamins-Minerals (MULTIVITAMIN PO) Take by mouth daily.    . Omega-3 Fatty Acids (FISH OIL PO) Take by mouth daily. Mega Red     No current facility-administered medications for this visit.     OBJECTIVE: Older white woman who appears stated age  27:   11/04/17 1347  BP: (!) 128/58  Pulse: 72  Resp: 18  Temp: 98.3 F (36.8 C)  SpO2: 98%     Body mass index is 23.87 kg/m.  Wt Readings from Last 3 Encounters:  11/04/17 130 lb 8 oz (59.2 kg)  09/16/17 132 lb (59.9 kg)  09/11/17 132 lb 12.8 oz (60.2 kg)      ECOG FS:1 - Symptomatic but completely ambulatory   Sclerae unicteric, EOMs intact Oropharynx clear and moist No cervical or supraclavicular adenopathy Lungs no rales or rhonchi Heart regular rate and rhythm Abd soft, nontender, positive bowel sounds MSK no focal spinal tenderness, no upper extremity lymphedema Neuro: nonfocal, well oriented, appropriate affect Breasts: Status post right lumpectomy and radiation remotely.  There is no evidence of local recurrence.  Status post left lumpectomy and radiation recently.  There is minimal erythema but no desquamation.  The area of discomfort around the left nipple is unremarkable by palpation and inspection.  Both axillae are benign.   LAB RESULTS:  CMP     Component Value Date/Time   NA 143 09/11/2017 1045   NA 144 10/24/2014 1426   K 4.5 09/11/2017 1045   K  4.1 10/24/2014 1426   CL 107 09/11/2017 1045   CL 107 06/08/2012 0851   CO2 29 09/11/2017 1045   CO2 29 10/24/2014 1426   GLUCOSE 90 09/11/2017 1045   GLUCOSE 114 10/24/2014 1426   GLUCOSE 107 (H) 06/08/2012 0851   BUN 17 09/11/2017 1045   BUN 13.1 10/24/2014 1426   CREATININE 0.65 09/11/2017 1045   CREATININE 0.7 10/24/2014 1426   CALCIUM 9.8 09/11/2017 1045   CALCIUM 9.3 10/24/2014 1426   PROT 6.3 09/11/2017 1045   PROT 6.1 (L) 10/24/2014 1426   ALBUMIN 4.2 07/23/2017 1510   ALBUMIN 3.5 10/24/2014 1426   AST 18 09/11/2017 1045   AST 19 07/23/2017 1510   AST 18 10/24/2014 1426   ALT 15 09/11/2017 1045   ALT 18 07/23/2017 1510   ALT 16 10/24/2014 1426   ALKPHOS 60 07/23/2017 1510   ALKPHOS 54 10/24/2014 1426   BILITOT 0.6 09/11/2017 1045   BILITOT 0.3 07/23/2017 1510   BILITOT 0.43 10/24/2014 1426   GFRNONAA 80 09/11/2017 1045   GFRAA 93 09/11/2017 1045    No results found for: TOTALPROTELP, ALBUMINELP, A1GS, A2GS, BETS, BETA2SER, GAMS, MSPIKE, SPEI  No results found for: KPAFRELGTCHN, LAMBDASER, KAPLAMBRATIO  Lab Results  Component Value Date   WBC 6.2 09/11/2017   NEUTROABS 3,515 09/11/2017   HGB 12.8 09/11/2017   HCT 38.5 09/11/2017   MCV 95.8 09/11/2017   PLT 244 09/11/2017    _0 @  Lab Results  Component Value Date   LABCA2 37 12/10/2011    No components found for: [id number]  No results for input(s): INR in the last 168 hours.  Lab Results  Component Value Date   LABCA2 37 12/10/2011    No results found for: NGE952  No results found for: WUX324  No results found for: MWN027  No results found for: CA2729  No components found for: HGQUANT  No results found for: CEA1 / No results found for: CEA1   No results found for: AFPTUMOR  No results found for: [place]  No results found for: PSA1  No visits with results within 3 Day(s) from this visit.  Latest known visit with results is:  Office Visit on 09/11/2017  Component  Date Value Ref Range Status  . WBC 09/11/2017 6.2  3.8 - 10.8 Thousand/uL Final  . RBC 09/11/2017 4.02  3.80 - 5.10 Million/uL Final  . Hemoglobin 09/11/2017 12.8  11.7 - 15.5 g/dL Final  . HCT 09/11/2017 38.5  35.0 -  45.0 % Final  . MCV 09/11/2017 95.8  80.0 - 100.0 fL Final  . MCH 09/11/2017 31.8  27.0 - 33.0 pg Final  . MCHC 09/11/2017 33.2  32.0 - 36.0 g/dL Final  . RDW 09/11/2017 11.9  11.0 - 15.0 % Final  . Platelets 09/11/2017 244  140 - 400 Thousand/uL Final  . MPV 09/11/2017 11.2  7.5 - 12.5 fL Final  . Neutro Abs 09/11/2017 3,515  1,500 - 7,800 cells/uL Final  . Lymphs Abs 09/11/2017 1,872  850 - 3,900 cells/uL Final  . WBC mixed population 09/11/2017 595  200 - 950 cells/uL Final  . Eosinophils Absolute 09/11/2017 167  15 - 500 cells/uL Final  . Basophils Absolute 09/11/2017 50  0 - 200 cells/uL Final  . Neutrophils Relative % 09/11/2017 56.7  % Final  . Total Lymphocyte 09/11/2017 30.2  % Final  . Monocytes Relative 09/11/2017 9.6  % Final  . Eosinophils Relative 09/11/2017 2.7  % Final  . Basophils Relative 09/11/2017 0.8  % Final  . Glucose, Bld 09/11/2017 90  65 - 99 mg/dL Final   Comment: .            Fasting reference interval .   . BUN 09/11/2017 17  7 - 25 mg/dL Final  . Creat 09/11/2017 0.65  0.60 - 0.88 mg/dL Final   Comment: For patients >60 years of age, the reference limit for Creatinine is approximately 13% higher for people identified as African-American. .   . GFR, Est Non African American 09/11/2017 80  > OR = 60 mL/min/1.66m Final  . GFR, Est African American 09/11/2017 93  > OR = 60 mL/min/1.72mFinal  . BUN/Creatinine Ratio 0878/93/8101OT APPLICABLE  6 - 22 (calc) Final  . Sodium 09/11/2017 143  135 - 146 mmol/L Final  . Potassium 09/11/2017 4.5  3.5 - 5.3 mmol/L Final  . Chloride 09/11/2017 107  98 - 110 mmol/L Final  . CO2 09/11/2017 29  20 - 32 mmol/L Final  . Calcium 09/11/2017 9.8  8.6 - 10.4 mg/dL Final  . Total Protein 09/11/2017 6.3   6.1 - 8.1 g/dL Final  . Albumin 09/11/2017 4.2  3.6 - 5.1 g/dL Final  . Globulin 09/11/2017 2.1  1.9 - 3.7 g/dL (calc) Final  . AG Ratio 09/11/2017 2.0  1.0 - 2.5 (calc) Final  . Total Bilirubin 09/11/2017 0.6  0.2 - 1.2 mg/dL Final  . Alkaline phosphatase (APISO) 09/11/2017 50  33 - 130 U/L Final  . AST 09/11/2017 18  10 - 35 U/L Final  . ALT 09/11/2017 15  6 - 29 U/L Final  . TSH 09/11/2017 1.92  0.40 - 4.50 mIU/L Final  . Cholesterol 09/11/2017 225* <200 mg/dL Final  . HDL 09/11/2017 72  >50 mg/dL Final  . Triglycerides 09/11/2017 86  <150 mg/dL Final  . LDL Cholesterol (Calc) 09/11/2017 134* mg/dL (calc) Final   Comment: Reference range: <100 . Desirable range <100 mg/dL for primary prevention;   <70 mg/dL for patients with CHD or diabetic patients  with > or = 2 CHD risk factors. . Marland KitchenDL-C is now calculated using the Martin-Hopkins  calculation, which is a validated novel method providing  better accuracy than the Friedewald equation in the  estimation of LDL-C.  MaCresenciano Genret al. JAAnnamaria [id number];258(52 [id number]  (http://education.[facility].com/faq/FAQ164)   . Total CHOL/HDL Ratio 09/11/2017 3.1  <5.0 (calc) Final  . Non-HDL Cholesterol (Calc) 09/11/2017 153* <130 mg/dL (calc) Final   Comment: For patients with diabetes  plus 1 major ASCVD risk  factor, treating to a non-HDL-C goal of <100 mg/dL  (LDL-C of <70 mg/dL) is considered a therapeutic  option.   . Hgb A1c MFr Bld 09/11/2017 5.5  <5.7 % of total Hgb Final   Comment: For the purpose of screening for the presence of diabetes: . <5.7%       Consistent with the absence of diabetes 5.7-6.4%    Consistent with increased risk for diabetes             (prediabetes) > or =6.5%  Consistent with diabetes . This assay result is consistent with a decreased risk of diabetes. . Currently, no consensus exists regarding use of hemoglobin A1c for diagnosis of diabetes in children. . According to American Diabetes  Association (ADA) guidelines, hemoglobin A1c <7.0% represents optimal control in non-pregnant diabetic patients. Different metrics may apply to specific patient populations.  Standards of Medical Care in Diabetes(ADA). .   . Mean Plasma Glucose 09/11/2017 111  (calc) Final  . eAG (mmol/L) 09/11/2017 6.2  (calc) Final    (this displays the last labs from the last 3 days)  No results found for: TOTALPROTELP, ALBUMINELP, A1GS, A2GS, BETS, BETA2SER, GAMS, MSPIKE, SPEI (this displays SPEP labs)  No results found for: KPAFRELGTCHN, LAMBDASER, KAPLAMBRATIO (kappa/lambda light chains)  No results found for: HGBA, HGBA2QUANT, HGBFQUANT, HGBSQUAN (Hemoglobinopathy evaluation)   No results found for: LDH  Lab Results  Component Value Date   IRON 64 08/09/2014   TIBC 336 08/09/2014   IRONPCTSAT 19 (L) 08/09/2014   (Iron and TIBC)  No results found for: FERRITIN  Urinalysis    Component Value Date/Time   COLORURINE YELLOW 08/22/2016 0935   APPEARANCEUR CLEAR 08/22/2016 0935   LABSPEC 1.013 08/22/2016 0935   PHURINE 6.5 08/22/2016 0935   GLUCOSEU NEGATIVE 08/22/2016 0935   HGBUR NEGATIVE 08/22/2016 0935   BILIRUBINUR NEGATIVE 08/22/2016 0935   KETONESUR NEGATIVE 08/22/2016 0935   PROTEINUR NEGATIVE 08/22/2016 0935   UROBILINOGEN 0.2 08/09/2014 1505   NITRITE NEGATIVE 08/22/2016 0935   LEUKOCYTESUR NEGATIVE 08/22/2016 0935     STUDIES: No results found.  ELIGIBLE FOR AVAILABLE RESEARCH PROTOCOL: no  ASSESSMENT: 84 y.o. [place], [place] woman with a prior history of right-sided breast cancer, now status post left breast upper outer quadrant biopsy 07/01/2017 for a clinical T1c N0, stage IA invasive ductal carcinoma, grade 1, estrogen and progesterone receptor positive, HER-2 not amplified, with an MIB-1 of 50%  (1) status post right lumpectomy June of 2012 for a , stage IA invasive ductal carcinoma, grade 1, with abundant extracellular mucin, and no lymph node involvement.      (a) completed adjuvant radiation August 2012  (b) no additional adjuvant chemotherapy  (c) two right axillary lymph nodes were removed  (2) Left breast lumpectomy on 07/29/2017: pT1c pNX, stage IA  Invasive ductal carcinoma, grade 1, with negative margins  (3) adjuvant radiation completed 10/22/2017  (4) anastrozole started 09/02/2017, stopped 09/08/2017 with intolerance  (a) patient canceled scheduled bone density scan    PLAN:  Victoria Long tolerated her radiation and surgery moderately well.  She will completely recover from her radiation within the next few weeks.  Unfortunately she was not able to tolerate anastrozole.  Today we discussed tamoxifen and she understands the possible toxicities, side effects and complications of this agent, including the risk of blood clots, which would be the major concern.  On the plus side though it can be very helpful as far as bone density is  concerned.  Of course it would cut her risk of breast cancer in half  After much discussion she decided she does not want any more pills.  She tells me every time she goes to the doctor they have 1 more thing they want to do and she just wants to be followed.  Given her age and the fact that her tumor was grade 1 and clinically node-negative, it is very unlikely to take her life even if she does not receive systemic therapy adjuvantly.  Accordingly I am going to start seeing her on a once a year basis.  Since she gets her mammography in June I will see her again in mid July of next year.  We will do physical exam and lab work at that time  She knows to call for any other issue that may develop before the next visit.    ADDENDUM: I met with Victoria Long on 09/02/2017 and reviewed her pathology which is favorable.  We reviewed the studies that allow patients her age with tumors like hers to avoid radiation if they take an antiestrogen for 5 years.  We then discussed the difference between tamoxifen and anastrozole in  detail. She understands that anastrozole and the aromatase inhibitors in general work by blocking estrogen production. Accordingly vaginal dryness, decrease in bone density, and of course hot flashes can result. The aromatase inhibitors can also negatively affect the cholesterol profile, although that is a minor effect. One out of 5 women on aromatase inhibitors we will feel "old and achy". This arthralgia/myalgia syndrome, which resembles fibromyalgia clinically, does resolve with stopping the medications. Accordingly this is not a reason to not try an aromatase inhibitor but it is a frequent reason to stop it (in other words 20% of women will not be able to tolerate these medications).  Tamoxifen on the other hand does not block estrogen production. It does not "take away a woman's estrogen". It blocks the estrogen receptor in breast cells. Like anastrozole, it can also cause hot flashes. As opposed to anastrozole, tamoxifen has many estrogen-like effects. It is technically an estrogen receptor modulator. This means that in some tissues tamoxifen works like estrogen-- for example it helps strengthen the bones. It tends to improve the cholesterol profile. It can cause thickening of the endometrial lining, and even endometrial polyps or rarely cancer of the uterus.(The risk of uterine cancer due to tamoxifen is one additional cancer per thousand women year). It can cause vaginal wetness or stickiness. It can cause blood clots through this estrogen-like effect--the risk of blood clots with tamoxifen is exactly the same as with birth control pills or hormone replacement.  Neither of these agents causes mood changes or weight gain, despite the popular belief that they can have these side effects. We have data from studies comparing either of these drugs with placebo, and in those cases the control group had the same amount of weight gain and depression as the group that took the drug.  I think she would be a good  candidate for anastrozole and we went ahead and placed the prescription in for her.  She will call if she has any difficulties with this drug.  I personally saw this patient and performed a substantive portion of this encounter with the listed APP documented above.   Addendum #2: The patient called today, 09/08/2017 explaining that there was no way she could possibly take this drug.  She hates to take medications.  She prefers to undergo radiation.  I  have made the appropriate arrangements for her to see her radiation oncologist soon and we called her reassuring her that we expect her to do well even if she does not take the antiestrogens.   Long, Victoria Dad, MD  11/04/17 2:08 PM Medical Oncology and Hematology [facility] [address] [place], [place] [address] Tel. [phone]    Fax. [phone]  Alice Rieger, am acting as scribe for [name] MD.  I, Lurline Del MD, have reviewed the above documentation for accuracy and completeness, and I agree with the above.

## 2017-11-03 ENCOUNTER — Ambulatory Visit: Payer: Medicare Other

## 2017-11-04 ENCOUNTER — Inpatient Hospital Stay: Payer: Medicare Other | Attending: Oncology | Admitting: Oncology

## 2017-11-04 ENCOUNTER — Ambulatory Visit: Payer: Medicare Other

## 2017-11-04 VITALS — BP 128/58 | HR 72 | Temp 98.3°F | Resp 18 | Ht 62.0 in | Wt 130.5 lb

## 2017-11-04 DIAGNOSIS — C50912 Malignant neoplasm of unspecified site of left female breast: Secondary | ICD-10-CM

## 2017-11-04 DIAGNOSIS — C50412 Malignant neoplasm of upper-outer quadrant of left female breast: Secondary | ICD-10-CM | POA: Diagnosis not present

## 2017-11-04 DIAGNOSIS — Z170 Estrogen receptor positive status [ER+]: Secondary | ICD-10-CM

## 2017-11-04 DIAGNOSIS — D0361 Melanoma in situ of right upper limb, including shoulder: Secondary | ICD-10-CM

## 2017-11-05 ENCOUNTER — Ambulatory Visit: Payer: Medicare Other

## 2017-11-05 ENCOUNTER — Telehealth: Payer: Self-pay | Admitting: Oncology

## 2017-11-05 NOTE — Telephone Encounter (Signed)
Per 10/16 los, made appt for July 2020. Called patient and left message with appt date and time.  Mailed calendar.

## 2017-11-06 ENCOUNTER — Ambulatory Visit: Payer: Medicare Other

## 2017-11-07 ENCOUNTER — Ambulatory Visit: Payer: Medicare Other

## 2017-11-10 ENCOUNTER — Ambulatory Visit: Payer: Medicare Other

## 2017-11-12 ENCOUNTER — Ambulatory Visit (INDEPENDENT_AMBULATORY_CARE_PROVIDER_SITE_OTHER): Payer: Medicare Other

## 2017-11-12 VITALS — Temp 97.5°F

## 2017-11-12 DIAGNOSIS — Z23 Encounter for immunization: Secondary | ICD-10-CM | POA: Diagnosis not present

## 2017-11-20 ENCOUNTER — Encounter: Payer: Self-pay | Admitting: Radiation Oncology

## 2017-11-20 NOTE — Progress Notes (Signed)
  Radiation Oncology         [phone] ________________________________  Name: Victoria Long MRN: [id number]  Date: 11/20/2017  DOB: [birth year]  End of Treatment Note  Diagnosis:   left-sided breast cancer     Indication for treatment:  Curative       Radiation treatment dates:   09/25/2017 - 10/22/2017  Site/dose:   The patient initially received a dose of 42.5 Gy in 17 fractions to the breast using whole-breast tangent fields. This was delivered using a 3-D conformal technique. The patient then received a boost to the seroma. This delivered an additional 8 Gy in 4 fractions using an en face electron field due to the depth of the seroma. The total dose was 50.5 Gy.  Narrative: The patient tolerated radiation treatment relatively well. The patient had some expected skin irritation as she progressed during treatment. Moist desquamation was not present at the end of treatment. She experienced tenderness and a rash to the breast area.  Plan: The patient has completed radiation treatment. The patient will return to radiation oncology clinic for routine followup in one month. I advised the patient to call or return sooner if they have any questions or concerns related to their recovery or treatment. ________________________________  Jodelle Gross, M.D., Ph.D.  This document serves as a record of services personally performed by Kyung Rudd, MD. It was created on his behalf by Wilburn Mylar, a trained medical scribe. The creation of this record is based on the scribe's personal observations and the provider's statements to them. This document has been checked and approved by the attending provider.

## 2018-01-06 ENCOUNTER — Ambulatory Visit: Payer: Self-pay | Admitting: Adult Health Nurse Practitioner

## 2018-03-24 ENCOUNTER — Encounter: Payer: Self-pay | Admitting: Adult Health Nurse Practitioner

## 2018-03-24 ENCOUNTER — Encounter: Payer: Self-pay | Admitting: Physician Assistant

## 2018-05-27 NOTE — Progress Notes (Signed)
CPE AND FOLLOW UP  Assessment and Plan:  Vitamin D deficiency Continue supplement  Prediabetes Discussed general issues about diabetes pathophysiology and management., Educational material distributed., Suggested low cholesterol diet., Encouraged aerobic exercise., Discussed foot care., Reminded to get yearly retinal exam. -     Hemoglobin A1c  Hyperlipidemia, unspecified hyperlipidemia type -continue medications, check lipids, decrease fatty foods, increase activity.  -     Lipid panel  Encounter for long-term (current) use of medications  History of right breast cancer monitor  Pancreatic mass Due to age and discussed advanced care planning, patient just wants observation, does not want further test or any treatment  CPE Due next year  Atherosclerosis of aorta ([place]) Control blood pressure, cholesterol, glucose, increase exercise.   History of melanoma Continue to monitor  Right knee pain mobic 7.5mg  RX given, continue tyelnol, continue follow up ortho Goal is to keep quality of life and keep moving  Discussed med's effects and SE's. Screening labs and tests as requested with regular follow-up as recommended.  Future Appointments  Date Time Provider [place]  06/25/2018 11:40 AM [facility] DI[facility] TOMO 1 [facility] CE  08/11/2018  2:00 PM [facility] LAB 6 [facility] None  08/11/2018  2:30 PM Magrinat, [name], MD [facility] None  09/23/2018  2:00 PM Vicie Mutters, PA-C [facility] None  06/08/2019  2:00 PM Vicie Mutters, PA-C [facility] None      HPI  85 y.o. female  presents for a CPE and 3 month follow up.   Her blood pressure has been controlled at home, today their BP is BP: 136/80.  She does workout. She denies chest pain, shortness of breath, dizziness.   She has graduated from [facility] for history of right breast cancer. 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  Stage IA., was on anastrozole and following with Dr. Griffith Citron. Due for MGM next month 06/2017.   She has right knee pain, walking with cane, on advil 3 200mg  pills a day and one tylenol., takes with food. Had mechanical fall. Had injections 6 months ago with Dr. Noemi Chapel that did not help.   She is on cholesterol medication and denies myalgias. Her cholesterol is at goal. The cholesterol last visit was:  Lab Results  Component Value Date   CHOL 225 (H) 09/11/2017   HDL 72 09/11/2017   LDLCALC 134 (H) 09/11/2017   TRIG 86 09/11/2017   CHOLHDL 3.1 09/11/2017  . She has been working on diet and exercise for prediabetes, she is on bASA, she is not on ACE/ARB and denies foot ulcerations, hyperglycemia, hypoglycemia , increased appetite, nausea, paresthesia of the feet, polydipsia, polyuria, visual disturbances, vomiting and weight loss. Last A1C in the office was:  Lab Results  Component Value Date   HGBA1C 5.5 09/11/2017   Patient is on Vitamin D supplement.   Lab Results  Component Value Date   VD25OH 87 03/27/2017     She is seeing her dermatologist every 6 months for skin checks due to history of melanoma.   BMI is Body mass index is 23.89 kg/m., she is working on diet and exercise. Wt Readings from Last 3 Encounters:  06/01/18 130 lb 9.6 oz (59.2 kg)  11/04/17 130 lb 8 oz (59.2 kg)  09/16/17 132 lb (59.9 kg)    Current Medications:  Current Outpatient Medications on File Prior to Visit  Medication Sig Dispense Refill  . acetaminophen (TYLENOL) 325 MG tablet Take 2 tablets (650 mg total) by mouth every 6 (six) hours as needed.    . calcium carbonate (OS-CAL) 600 MG TABS Take 600 mg by mouth 2 (two) times daily with a meal.     . Cholecalciferol (VITAMIN D) 1000 UNITS capsule Take 1,000 Units by mouth 2 (two) times daily.     Marland Kitchen LYSINE HCL PO Take 500 mg by mouth daily.     . meloxicam (MOBIC) 7.5 MG tablet Take 1 pill  up to twice daily for pain, can take with tylenol, can not take with aleve, iburpofen, then as needed daily for pain 60 tablet 3  . Multiple Vitamins-Minerals (MULTIVITAMIN PO) Take by mouth daily.    . Omega-3 Fatty Acids (FISH OIL PO) Take by mouth daily. Mega Red     No current facility-administered medications on file prior to visit.     Health Maintenance:   Immunization History  Administered Date(s) Administered  . Influenza, High Dose Seasonal PF 11/08/2013, 10/04/2014, 11/13/2015, 10/31/2016, 11/12/2017  . Influenza-Unspecified 11/10/2012, 10/04/2014  . Pneumococcal Conjugate-13 07/27/2013  . Pneumococcal-Unspecified 11/30/2010  . Td 02/12/2009  . Zoster 02/12/2006    Tetanus: 2011 Pneumovax: 2012 Prevnar:  2015 Flu vaccine: 2017 Zostavax:  2008  MGM: 06/2017 DEXA: 2011, refuses further Colonoscopy: 2004 declines further due to age Last Eye Exam:  Dr. Ellie Lunch, 2017 PAP 2014 declines further due to age  Patient Care Team: Unk Pinto, MD as PCP - General (Internal Medicine) [name], MD as Consulting Physician (Gastroenterology) [name], MD as Referring Physician (Orthopedic Surgery) [name], MD as Consulting Physician (Dermatology) Magrinat, [name], MD as Consulting Physician (Oncology) Griselda Miner, MD as Consulting Physician (Dermatology) [name], MD as Consulting Physician (General Surgery)  Medical History:  Past Medical History:  Diagnosis Date  . Arthritis    knees  . Breast cancer ([place]) 06/2017   left breast  . Cancer ([place])    right breast cancer  . Hyperlipidemia   . Melanoma ([place]) 2014   right humerus  . Personal history of radiation therapy 2012  . Prediabetes   . Vitamin D deficiency    Allergies Allergies  Allergen Reactions  . Anastrozole   . Tramadol     Medication caused dizziness    SURGICAL HISTORY She  has a past surgical history that includes Cataract extraction (Bilateral, 2009);  Cesarean section; Melanoma excision (Right, 2014); Breast biopsy (Right, 05/28/2010); Breast lumpectomy (Right, 06/29/2010); and Mastectomy, partial (Left, 07/29/2017). FAMILY HISTORY Her family history includes Arthritis in her mother; Cancer in her sister; Diabetes in her maternal grandmother; Hypertension in her sister; Rheum arthritis in her mother. SOCIAL HISTORY She  reports that she quit smoking about 41 years ago. She quit after 10.00 years of use. She has never used smokeless tobacco. She reports current alcohol use of about 4.0 standard drinks of alcohol per week. She reports that she does not use drugs.    Review of Systems: Review of Systems  Constitutional: Negative for chills, fever and malaise/fatigue.  HENT: Negative for congestion, ear pain and sore throat.   Eyes: Negative.   Respiratory: Negative for cough, shortness of breath and wheezing.   Cardiovascular: Negative for chest pain, palpitations and leg swelling.  Gastrointestinal: Negative for abdominal pain, blood in stool, constipation, diarrhea, heartburn and melena.  Genitourinary: Negative.   Musculoskeletal: Positive for falls and joint pain. Negative for back pain, myalgias and neck pain.  Skin: Negative.   Neurological: Negative for dizziness, sensory change, loss of consciousness and headaches.  Psychiatric/Behavioral: Negative for depression. The patient is not nervous/anxious and does not have insomnia.     Physical Exam: Estimated body mass index is 23.89 kg/m as calculated from the following:   Height as of this encounter: 5\' 2"  (1.575 m).   Weight as of this encounter: 130 lb 9.6 oz (59.2 kg). BP 136/80   Pulse 76   Temp (!) 97.2 F (36.2 C)   Ht 5\' 2"  (1.575 m)   Wt 130 lb 9.6 oz (59.2 kg)   SpO2 99%   BMI 23.89 kg/m   General Appearance: Well nourished well developed, in no apparent distress.  Eyes: PERRLA, EOMs, left eye with stye external upper lid, conjunctiva no swelling or  erythema ENT/Mouth: Ear canals normal without obstruction after removal from left ear, swelling, erythema, or discharge.  TMs normal bilaterally with no erythema, bulging, retraction, or loss of landmark.  Oropharynx moist and clear with no exudate, erythema, or swelling.   Neck: Supple, thyroid normal. No bruits.  No cervical adenopathy Respiratory: Respiratory effort normal, Breath sounds clear A&P without wheeze, rhonchi, rales.   Cardio: RRR without murmurs, rubs or gallops. Brisk peripheral pulses without edema.  Chest: symmetric, with normal excursions Breasts: defer Abdomen: Soft, nontender, no guarding, rebound, hernias, masses, or organomegaly.  Lymphatics: Non tender without lymphadenopathy.  Genitourinary: defer Musculoskeletal: Full ROM all peripheral extremities,5/5 strength, and antalgic gait, negative straight leg Skin: Warm, dry without rashes, lesions, ecchymosis. Neuro: Awake and oriented X 3, Cranial nerves intact, reflexes equal bilaterally. Normal muscle tone, no cerebellar symptoms. Sensation intact.  Psych:  normal affect, Insight and Judgment appropriate.    Vicie Mutters 2:10 PM [facility]

## 2018-05-28 DIAGNOSIS — I700 Atherosclerosis of aorta: Secondary | ICD-10-CM | POA: Insufficient documentation

## 2018-06-01 ENCOUNTER — Encounter: Payer: Self-pay | Admitting: Physician Assistant

## 2018-06-01 ENCOUNTER — Other Ambulatory Visit: Payer: Self-pay

## 2018-06-01 ENCOUNTER — Ambulatory Visit (INDEPENDENT_AMBULATORY_CARE_PROVIDER_SITE_OTHER): Payer: Medicare Other | Admitting: Physician Assistant

## 2018-06-01 VITALS — BP 136/80 | HR 76 | Temp 97.2°F | Ht 62.0 in | Wt 130.6 lb

## 2018-06-01 DIAGNOSIS — Z853 Personal history of malignant neoplasm of breast: Secondary | ICD-10-CM

## 2018-06-01 DIAGNOSIS — I10 Essential (primary) hypertension: Secondary | ICD-10-CM | POA: Diagnosis not present

## 2018-06-01 DIAGNOSIS — H00011 Hordeolum externum right upper eyelid: Secondary | ICD-10-CM

## 2018-06-01 DIAGNOSIS — Z0000 Encounter for general adult medical examination without abnormal findings: Secondary | ICD-10-CM

## 2018-06-01 DIAGNOSIS — Z79899 Other long term (current) drug therapy: Secondary | ICD-10-CM

## 2018-06-01 DIAGNOSIS — E559 Vitamin D deficiency, unspecified: Secondary | ICD-10-CM

## 2018-06-01 DIAGNOSIS — K8689 Other specified diseases of pancreas: Secondary | ICD-10-CM

## 2018-06-01 DIAGNOSIS — E785 Hyperlipidemia, unspecified: Secondary | ICD-10-CM

## 2018-06-01 DIAGNOSIS — D0361 Melanoma in situ of right upper limb, including shoulder: Secondary | ICD-10-CM

## 2018-06-01 DIAGNOSIS — Z9181 History of falling: Secondary | ICD-10-CM

## 2018-06-01 DIAGNOSIS — Z0001 Encounter for general adult medical examination with abnormal findings: Secondary | ICD-10-CM

## 2018-06-01 DIAGNOSIS — S32009A Unspecified fracture of unspecified lumbar vertebra, initial encounter for closed fracture: Secondary | ICD-10-CM

## 2018-06-01 DIAGNOSIS — Z136 Encounter for screening for cardiovascular disorders: Secondary | ICD-10-CM

## 2018-06-01 DIAGNOSIS — I700 Atherosclerosis of aorta: Secondary | ICD-10-CM

## 2018-06-01 DIAGNOSIS — R7309 Other abnormal glucose: Secondary | ICD-10-CM

## 2018-06-01 MED ORDER — ERYTHROMYCIN 5 MG/GM OP OINT: TOPICAL_OINTMENT | OPHTHALMIC | 0 refills | Status: DC

## 2018-06-01 NOTE — Patient Instructions (Addendum)
MAXIMUM AMOUNT OF TYLENOL IN A DAY  You can take tylenol (500mg ) or tylenol arthritis (650mg ) with the meloxicam/antiinflammatories. The max you can take of tylenol a day is 3000mg  daily, this is a max of 6 pills a day of the regular tyelnol (500mg ) or a max of 4 a day of the tylenol arthritis (650mg ) as long as no other medications you are taking contain tylenol.   Use a dropper or use a cap to put peroxide, olive oil,mineral oil or canola oil in the effected ear- 2-3 times a week. Let it soak for 20-30 min then you can take a shower or use a baby bulb with warm water to wash out the ear wax.  Do not use Qtips   Do heating pad on your eye Can use ointment at night If not better please call and we will send in an antibiotic  Stye  A stye, also known as a hordeolum, is a bump that forms on an eyelid. It may look like a pimple next to the eyelash. A stye can form inside the eyelid (internal stye) or outside the eyelid (external stye). A stye can cause redness, swelling, and pain on the eyelid. Styes are very common. Anyone can get them at any age. They usually occur in just one eye, but you may have more than one in either eye. What are the causes? A stye is caused by an infection. The infection is almost always caused by bacteria called Staphylococcus aureus. This is a common type of bacteria that lives on the skin. An internal stye may result from an infected oil-producing gland inside the eyelid. An external stye may be caused by an infection at the base of the eyelash (hair follicle). What increases the risk? You are more likely to develop a stye if:  You have had a stye before.  You have any of these conditions: ? Diabetes. ? Red, itchy, inflamed eyelids (blepharitis). ? A skin condition such as seborrheic dermatitis or rosacea. ? High fat levels in your blood (lipids). What are the signs or symptoms? The most common symptom of a stye is eyelid pain. Internal styes are more painful  than external styes. Other symptoms may include:  Painful swelling of your eyelid.  A scratchy feeling in your eye.  Tearing and redness of your eye.  Pus draining from the stye. How is this diagnosed? Your health care provider may be able to diagnose a stye just by examining your eye. The health care provider may also check to make sure:  You do not have a fever or other signs of a more serious infection.  The infection has not spread to other parts of your eye or areas around your eye. How is this treated? Most styes will clear up in a few days without treatment or with warm compresses applied to the area. You may need to use antibiotic drops or ointment to treat an infection. In some cases, if your stye does not heal with routine treatment, your health care provider may drain pus from the stye using a thin blade or needle. This may be done if the stye is large, causing a lot of pain, or affecting your vision. Follow these instructions at home:  Take over-the-counter and prescription medicines only as told by your health care provider. This includes eye drops or ointments.  If you were prescribed an antibiotic medicine, apply or use it as told by your health care provider. Do not stop using the antibiotic even  if your condition improves.  Apply a warm, wet cloth (warm compress) to your eye for 5-10 minutes, 4 times a day.  Clean the affected eyelid as directed by your health care provider.  Do not wear contact lenses or eye makeup until your stye has healed.  Do not try to pop or drain the stye.  Do not rub your eye. Contact a health care provider if:  You have chills or a fever.  Your stye does not go away after several days.  Your stye affects your vision.  Your eyeball becomes swollen, red, or painful. Get help right away if:  You have pain when moving your eye around. Summary  A stye is a bump that forms on an eyelid. It may look like a pimple next to the eyelash.   A stye can form inside the eyelid (internal stye) or outside the eyelid (external stye). A stye can cause redness, swelling, and pain on the eyelid.  Your health care provider may be able to diagnose a stye just by examining your eye.  Apply a warm, wet cloth (warm compress) to your eye for 5-10 minutes, 4 times a day. This information is not intended to replace advice given to you by your health care provider. Make sure you discuss any questions you have with your health care provider. Document Released: 10/17/2004 Document Revised: 09/19/2016 Document Reviewed: 09/19/2016 Elsevier Interactive Patient Education  2019 [employer].

## 2018-06-02 LAB — COMPLETE METABOLIC PANEL WITH GFR
AG Ratio: 2.4 (calc) (ref 1.0–2.5)
ALT: 15 U/L (ref 6–29)
AST: 17 U/L (ref 10–35)
Albumin: 4.3 g/dL (ref 3.6–5.1)
Alkaline phosphatase (APISO): 52 U/L (ref 37–153)
BUN: 25 mg/dL (ref 7–25)
CO2: 31 mmol/L (ref 20–32)
Calcium: 9.7 mg/dL (ref 8.6–10.4)
Chloride: 107 mmol/L (ref 98–110)
Creat: 0.63 mg/dL (ref 0.60–0.88)
GFR, Est African American: 93 mL/min/{1.73_m2} (ref >=60)
GFR, Est Non African American: 80 mL/min/{1.73_m2} (ref >=60)
Globulin: 1.8 g/dL (calc) — ABNORMAL LOW (ref 1.9–3.7)
Glucose, Bld: 87 mg/dL (ref 65–99)
Potassium: 4.1 mmol/L (ref 3.5–5.3)
Sodium: 145 mmol/L (ref 135–146)
Total Bilirubin: 0.4 mg/dL (ref 0.2–1.2)
Total Protein: 6.1 g/dL (ref 6.1–8.1)

## 2018-06-02 LAB — CBC WITH DIFFERENTIAL/PLATELET
Absolute Monocytes: 554 cells/uL (ref 200–950)
Basophils Absolute: 39 cells/uL (ref 0–200)
Basophils Relative: 0.7 %
Eosinophils Absolute: 112 cells/uL (ref 15–500)
Eosinophils Relative: 2 %
HCT: 37.2 % (ref 35.0–45.0)
Hemoglobin: 12.5 g/dL (ref 11.7–15.5)
Lymphs Abs: 1378 cells/uL (ref 850–3900)
MCH: 33.2 pg — ABNORMAL HIGH (ref 27.0–33.0)
MCHC: 33.6 g/dL (ref 32.0–36.0)
MCV: 98.7 fL (ref 80.0–100.0)
MPV: 11 fL (ref 7.5–12.5)
Monocytes Relative: 9.9 %
Neutro Abs: 3517 cells/uL (ref 1500–7800)
Neutrophils Relative %: 62.8 %
Platelets: 225 10*3/uL (ref 140–400)
RBC: 3.77 10*6/uL — ABNORMAL LOW (ref 3.80–5.10)
RDW: 11.8 % (ref 11.0–15.0)
Total Lymphocyte: 24.6 %
WBC: 5.6 10*3/uL (ref 3.8–10.8)

## 2018-06-02 LAB — MAGNESIUM: Magnesium: 2 mg/dL (ref 1.5–2.5)

## 2018-06-02 LAB — MICROALBUMIN / CREATININE URINE RATIO
Creatinine, Urine: 66 mg/dL (ref 20–275)
Microalb Creat Ratio: 18 mcg/mg creat (ref <30)
Microalb, Ur: 1.2 mg/dL

## 2018-06-02 LAB — URINALYSIS, ROUTINE W REFLEX MICROSCOPIC
Bilirubin Urine: NEGATIVE
Glucose, UA: NEGATIVE
Hgb urine dipstick: NEGATIVE
Ketones, ur: NEGATIVE
Leukocytes,Ua: NEGATIVE
Nitrite: NEGATIVE
Protein, ur: NEGATIVE
Specific Gravity, Urine: 1.019 (ref 1.001–1.03)
pH: 6.5 (ref 5.0–8.0)

## 2018-06-02 LAB — LIPID PANEL
Cholesterol: 218 mg/dL — ABNORMAL HIGH (ref <200)
HDL: 69 mg/dL (ref >=50)
LDL Cholesterol (Calc): 124 mg/dL (calc) — ABNORMAL HIGH
Non-HDL Cholesterol (Calc): 149 mg/dL (calc) — ABNORMAL HIGH (ref <130)
Total CHOL/HDL Ratio: 3.2 (calc) (ref <5.0)
Triglycerides: 131 mg/dL (ref <150)

## 2018-06-02 LAB — TSH: TSH: 1.76 mIU/L (ref 0.40–4.50)

## 2018-06-02 LAB — VITAMIN D 25 HYDROXY (VIT D DEFICIENCY, FRACTURES): Vit D, 25-Hydroxy: 69 ng/mL (ref 30–100)

## 2018-06-25 ENCOUNTER — Ambulatory Visit
Admission: RE | Admit: 2018-06-25 | Discharge: 2018-06-25 | Disposition: A | Discharge status: Home / Self Care | Payer: Medicare Other | Source: Ambulatory Visit | Attending: Oncology | Admitting: Oncology

## 2018-06-25 ENCOUNTER — Other Ambulatory Visit: Payer: Self-pay

## 2018-06-25 DIAGNOSIS — C50912 Malignant neoplasm of unspecified site of left female breast: Secondary | ICD-10-CM

## 2018-06-25 DIAGNOSIS — D0361 Melanoma in situ of right upper limb, including shoulder: Secondary | ICD-10-CM

## 2018-06-25 DIAGNOSIS — C50412 Malignant neoplasm of upper-outer quadrant of left female breast: Secondary | ICD-10-CM

## 2018-06-25 DIAGNOSIS — Z170 Estrogen receptor positive status [ER+]: Secondary | ICD-10-CM

## 2018-07-05 ENCOUNTER — Other Ambulatory Visit: Payer: Self-pay | Admitting: Oncology

## 2018-07-16 ENCOUNTER — Ambulatory Visit: Payer: Medicare Other

## 2018-07-16 ENCOUNTER — Telehealth: Payer: Self-pay

## 2018-07-16 ENCOUNTER — Ambulatory Visit (INDEPENDENT_AMBULATORY_CARE_PROVIDER_SITE_OTHER): Payer: Medicare Other | Admitting: Orthopaedic Surgery

## 2018-07-16 ENCOUNTER — Other Ambulatory Visit: Payer: Self-pay

## 2018-07-16 DIAGNOSIS — M25561 Pain in right knee: Secondary | ICD-10-CM

## 2018-07-16 DIAGNOSIS — G8929 Other chronic pain: Secondary | ICD-10-CM

## 2018-07-16 MED ORDER — LIDOCAINE HCL 1 % IJ SOLN
3.0000 mL | INTRAMUSCULAR | Status: AC | PRN
  Administered 2018-07-16: 3 mL

## 2018-07-16 MED ORDER — METHYLPREDNISOLONE ACETATE 40 MG/ML IJ SUSP
40.0000 mg | INTRAMUSCULAR | Status: AC | PRN
  Administered 2018-07-16: 40 mg via INTRA_ARTICULAR

## 2018-07-16 NOTE — Progress Notes (Signed)
Office Visit Note   Patient: Victoria Long           Date of Birth: 1928/08/09           MRN: [id number] Visit Date: 07/16/2018              Requested by: Unk Pinto, [place] [address],  [place] [address] PCP: Unk Pinto, MD   Assessment & Plan: Visit Diagnoses:  1. Chronic pain of right knee     Plan: She does have severe arthritis in her right knee.  I did offer steroid injection today.  I talked about steroid injections risks and benefits as well as hyaluronic acid.  I do feel that she is a candidate for hyaluronic acid about 4 weeks from now after trying a steroid today.  She will try her cane in her opposite hand and can certainly try over-the-counter Voltaren gel as well.  She tolerated steroid injection well today.  We will see her back in 4 weeks for hyaluronic acid injection in the right knee.  All question concerns were answered and addressed.  Follow-Up Instructions: No follow-ups on file.   Orders:  Orders Placed This Encounter  Procedures  . Large Joint Inj  . XR Knee 1-2 Views Right   No orders of the defined types were placed in this encounter.     Procedures: Large Joint Inj: R knee on 07/16/2018 1:09 PM Indications: diagnostic evaluation and pain Details: 22 G 1.5 in needle, superolateral approach  Arthrogram: No  Medications: 3 mL lidocaine 1 %; 40 mg methylPREDNISolone acetate 40 MG/ML Outcome: tolerated well, no immediate complications Procedure, treatment alternatives, risks and benefits explained, specific risks discussed. Consent was given by the patient. Immediately prior to procedure a time out was called to verify the correct patient, procedure, equipment, support staff and site/side marked as required. Patient was prepped and draped in the usual sterile fashion.       Clinical Data: No additional findings.   Subjective: Chief Complaint  Patient presents with  . Right Knee - Pain  The patient is  a very pleasant 85 year old female with severe right knee pain.  This is been going on for many years.  She ambulates using a cane.  She has had hyaluronic acid injections in her knee before but is been over a year.  She is had steroid injections in her knee before but that is been about 2 years.  Her pain is daily and is detrimentally affecting her mobility, her quality of life and activities daily living.  She does take Tylenol for pain.  Once the coronavirus pandemic is eased up and [facility] she would like to get back into water aerobics.  Daughter-in-law is with her today.  She is still not interested in any type of surgical intervention.  She is not had injections again in a long period of time.  She points the medial aspect of her right knee as a source of her pain.  She does use her cane in the right side when she ambulates.  HPI  Review of Systems She currently denies any headache, chest pain, shortness of breath, fever, chills, nausea, vomiting  Objective: Vital Signs: There were no vitals taken for this visit.  Physical Exam She is alert and orient x3 and in no acute distress Ortho Exam Examination of her right knee shows significant medial joint line tenderness and posterior pain with flexion of the  knee.  It is ligamentously stable but definitely shows patellofemoral crepitation and significant medial joint line tenderness with obvious osteoarthritic findings. Specialty Comments:  No specialty comments available.  Imaging: Xr Knee 1-2 Views Right  Result Date: 07/16/2018 2 views of the right knee show severe end-stage arthritis with varus malalignment.  There is complete loss of the medial joint space.  There are osteophytes in all 3 compartments.    PMFS History: Patient Active Problem List   Diagnosis Date Noted  . Atherosclerosis of aorta ([place]) 05/28/2018  . Malignant neoplasm of upper-outer quadrant of left breast in female, estrogen receptor positive  ([place]) 07/23/2017  . Melanoma in situ of upper arm, right ([place]) 07/23/2017  . At high risk for falls 07/23/2017  . Invasive ductal carcinoma of breast, female, left ([place]) 07/15/2017  . Essential hypertension 10/01/2016  . Pancreatic mass 08/22/2016  . Traumatic fracture of lumbar vertebra ([place]) 08/22/2016  . History of right breast cancer 10/25/2013  . Encounter for long-term (current) use of medications 04/19/2013  . Hyperlipidemia   . Abnormal glucose   . Vitamin D deficiency    Past Medical History:  Diagnosis Date  . Arthritis    knees  . Breast cancer ([place]) 2019   Left Breast  . Breast cancer ([place]) 2012   Right Breast  . Cancer ([place]) 2012   right breast cancer  . Hyperlipidemia   . Melanoma ([place]) 2014   right humerus  . Personal history of radiation therapy 2012   Right Breast Cancer  . Personal history of radiation therapy 2019   Left Breast Cancer  . Prediabetes   . Vitamin D deficiency     Family History  Problem Relation Age of Onset  . Arthritis Mother   . Rheum arthritis Mother   . Hypertension Sister   . Cancer Sister        type unknown  . Diabetes Maternal Grandmother     Past Surgical History:  Procedure Laterality Date  . BREAST BIOPSY Right 05/28/2010  . BREAST LUMPECTOMY Right 06/29/2010   with radiation no chemo  . BREAST LUMPECTOMY Left 08/08/2017  . CATARACT EXTRACTION Bilateral 2009  . CESAREAN SECTION    . MASTECTOMY, PARTIAL Left 07/29/2017   Procedure: LEFT BREAST PARTIAL MASTECTOMY;  Surgeon: [name], MD;  Location: [facility];  Service: General;  Laterality: Left;  Marland Kitchen MELANOMA EXCISION Right 2014   humerus   Social History   Occupational History  . Not on file  Tobacco Use  . Smoking status: Former Smoker    Years: 10.00    Quit date: 08/21/1976    Years since quitting: 41.9  . Smokeless tobacco: Never Used  Substance and Sexual Activity  . Alcohol use: Yes    Alcohol/week: 4.0 standard drinks    Types:  4 Standard drinks or equivalent per week    Comment: wine  . Drug use: No  . Sexual activity: Not on file

## 2018-07-16 NOTE — Telephone Encounter (Signed)
Right knee gel injection  

## 2018-07-17 NOTE — Telephone Encounter (Signed)
Printed demographics for submission.  

## 2018-07-23 ENCOUNTER — Telehealth: Payer: Self-pay | Admitting: Oncology

## 2018-07-23 NOTE — Telephone Encounter (Signed)
GM PAL 7/21 f/u moved to 7/31 as webex. Left message. Schedule mailed.

## 2018-07-30 ENCOUNTER — Telehealth: Payer: Self-pay

## 2018-07-30 NOTE — Telephone Encounter (Signed)
Patient is approved for Durolane, right knee. [place] Patient is covered at 100% through her insurance  Co-pay of $50.00 No PA required  Appt. 08/13/2018 with Dr. [name]

## 2018-08-11 ENCOUNTER — Other Ambulatory Visit: Payer: Medicare Other

## 2018-08-11 ENCOUNTER — Ambulatory Visit: Payer: Medicare Other | Admitting: Oncology

## 2018-08-13 ENCOUNTER — Other Ambulatory Visit: Payer: Self-pay

## 2018-08-13 ENCOUNTER — Ambulatory Visit (INDEPENDENT_AMBULATORY_CARE_PROVIDER_SITE_OTHER): Payer: Medicare Other | Admitting: Orthopaedic Surgery

## 2018-08-13 ENCOUNTER — Encounter: Payer: Self-pay | Admitting: Orthopaedic Surgery

## 2018-08-13 DIAGNOSIS — M25561 Pain in right knee: Secondary | ICD-10-CM

## 2018-08-13 DIAGNOSIS — M1711 Unilateral primary osteoarthritis, right knee: Secondary | ICD-10-CM

## 2018-08-13 DIAGNOSIS — G8929 Other chronic pain: Secondary | ICD-10-CM

## 2018-08-13 MED ORDER — SODIUM HYALURONATE 60 MG/3ML IX PRSY
60.0000 mg | PREFILLED_SYRINGE | INTRA_ARTICULAR | Status: AC | PRN
  Administered 2018-08-13: 60 mg via INTRA_ARTICULAR

## 2018-08-13 NOTE — Progress Notes (Signed)
   Procedure Note  Patient: Victoria Long             Date of Birth: 08/23/1928           MRN: [id number]             Visit Date: 08/13/2018  Procedures: Visit Diagnoses:  1. Chronic pain of right knee   2. Unilateral primary osteoarthritis, right knee     Large Joint Inj on 08/13/2018 2:52 PM Indications: diagnostic evaluation and pain Details: 22 G 1.5 in needle, superolateral approach  Arthrogram: No  Medications: 60 mg Sodium Hyaluronate 60 MG/3ML Outcome: tolerated well, no immediate complications Procedure, treatment alternatives, risks and benefits explained, specific risks discussed. Consent was given by the patient. Immediately prior to procedure a time out was called to verify the correct patient, procedure, equipment, support staff and site/side marked as required. Patient was prepped and draped in the usual sterile fashion.    The patient comes in today for scheduled hyaluronic acid injection in her right knee to treat the pain from osteoarthritis.  She is already tried and failed other conservative treatment measures including steroid injection.  She understands why we are trying this injection.  She is a very active 85 years old.  She does use a cane as well.  There is no effusion of her knee today.  We did place Durolane in her right knee today without difficulty.  All question concerns were answered addressed.  Follow-up can be as needed.

## 2018-08-21 ENCOUNTER — Ambulatory Visit: Payer: Medicare Other | Admitting: Oncology

## 2018-08-31 ENCOUNTER — Telehealth: Payer: Self-pay | Admitting: Oncology

## 2018-08-31 NOTE — Progress Notes (Signed)
Victoria Long  [phone] [phone]) [phone]     ID: Victoria Long DOB: August 05, 1928  MR#: [id number]  WLN#:[id number]  Patient Care Team: Unk Pinto, MD as PCP - General (Internal Medicine) [name], MD as Consulting Physician (Gastroenterology) [name], MD as Referring Physician (Orthopedic Surgery) [name], MD as Consulting Physician (Dermatology) [name], [name], MD as Consulting Physician (Oncology) Griselda Miner, MD as Consulting Physician (Dermatology) [name], MD as Consulting Physician (General Surgery) [name], MD OTHER MD:  CHIEF COMPLAINT: Estrogen receptor positive breast cancer  CURRENT TREATMENT: Observation   INTERVAL HISTORY: Victoria Long is here today for follow up of her estrogen positive breast cancer. She continues under observation.  She called today to let us know that about a week ago she noted redness and swelling in her left breast.  She waited to see what would happen and it got a little bit better but has not cleared so she came in today to have that evaluated.  Since her last visit, she underwent bilateral diagnostic mammography with tomography at [name] on 06/25/2018 showing: breast density category B; no evidence of malignancy in either breast.   REVIEW OF SYSTEMS: Victoria Long reports noticing a change in her left breast starting about 10 days ago. She states the skin darkened, the nipple swelled. Since then, the skin has turned red, and she states the left breast is larger than the right. She denies any fevers. She reports her right knee is a problem. She has received a steroid shot under Dr. [name]. Two of her four sons live in [name]. She states they don't call very often. A detailed review of systems was otherwise stable.     HISTORY OF CURRENT ILLNESS: From the original intake note:  Victoria Long had routine screening mammography on 06/23/2017  showing a possible abnormality in the left breast. She underwent unilateral left diagnostic mammography with tomography and left breast ultrasonography at [facility] on 06/27/2017 showing: breast density category B. There was a mass at 1 o'clock in the left breast upper outer quadrant measuring 1.6 x 1.0 x 1.2 cm and located 4 cm from the nipple. There was no evidence of left axillary lymphadenopathy.  Accordingly on 07/01/2017 she proceeded to biopsy of the left breast area in question. The pathology from this procedure showed ([id number]): Invasive ductal carcinoma grade I. Prognostic indicators significant for: estrogen receptor, 100% positive and progesterone receptor, 60% positive, both with strong staining intensity. Proliferation marker Ki67 at 50% . HER2 not amplified with ratios HER2/CEP17 signals 1.43 and average HER2 copies per cell 3.30  Note that she also has a history of right-sided breast cancer status post right lumpectomy and sentinel lymph node sampling 06/29/2010, for a 0.6 cm invasive ductal carcinoma, grade 1, present at the anterior margin, with both sentinel lymph nodes negative.  The tumor was estrogen and progesterone receptor strongly positive, HER-2 not amplified, with an MIB-104, and was treated adjuvantly with radiation alone.  The patient's subsequent history is as detailed below.   PAST MEDICAL HISTORY: Past Medical History:  Diagnosis Date  . Arthritis    knees  . Breast cancer ([name]) 2019   Left Breast  . Breast cancer ([name]) 2012   Right Breast  . Cancer ([name]) 2012   right breast cancer  . Hyperlipidemia   . Melanoma ([name]) 2014   right humerus  . Personal history of radiation therapy 2012   Right Breast Cancer  . Personal history  of radiation therapy 2019   Left Breast Cancer  . Prediabetes   . Vitamin D deficiency     PAST SURGICAL HISTORY: Past Surgical History:  Procedure Laterality Date  . BREAST BIOPSY Right 05/28/2010  . BREAST  LUMPECTOMY Right 06/29/2010   with radiation no chemo  . BREAST LUMPECTOMY Left 08/08/2017  . CATARACT EXTRACTION Bilateral 2009  . CESAREAN SECTION    . MASTECTOMY, PARTIAL Left 07/29/2017   Procedure: LEFT BREAST PARTIAL MASTECTOMY;  Surgeon: [name], MD;  Location: [name];  Service: General;  Laterality: Left;  Marland Kitchen MELANOMA EXCISION Right 2014   humerus  Lipoma removed from right arm   FAMILY HISTORY Family History  Problem Relation Age of Onset  . Arthritis Mother   . Rheum arthritis Mother   . Hypertension Sister   . Cancer Sister        type unknown  . Diabetes Maternal Grandmother   The patient's mother died at age 83 due to RA. The patient's father died due to heart issues. The patient had 1 brother and 2 sisters. She denies a family history of breast or ovarian cancer.  GYNECOLOGIC HISTORY:  No LMP recorded. Patient is postmenopausal. Menarche: 85 years old Age at first live birth: 85 years old She is GXP4.  Her LMP was in her early 71's.  She did not use HRT or oral contraception.    SOCIAL HISTORY:  [name] is retired from being a kindergarten and [employer]. She is widowed twice over. At home is just herself with no pets. The patient's oldest son, Victoria Long lives in [place] as a Forensic scientist. The patient's second is son, Victoria Long who lives in [place] as a Development worker, international aid. The patient's third child is Victoria Crumble "Harrie Jeans" who works in Press photographer in [place]. The patient's youngest child is son, Victoria [place] "Mikki Santee" who lives in [place] in business. The patient has 10 grandchildren- who are all college graduates. She also has 7 great-grandchildren. The patient belongs to [employer] in [place].     ADVANCED DIRECTIVES: Victoria Long intends to name her son, Victoria Reichmann "Elta Guadeloupe" as her HCPOA   HEALTH MAINTENANCE: Social History   Tobacco Use  . Smoking status: Former Smoker    Years: 10.00    Quit date: 08/21/1976    Years  since quitting: 42.0  . Smokeless tobacco: Never Used  Substance Use Topics  . Alcohol use: Yes    Alcohol/week: 4.0 standard drinks    Types: 4 Standard drinks or equivalent per week    Comment: wine  . Drug use: No     Colonoscopy: UTD  PAP:  Bone density: recent/ normal    Allergies  Allergen Reactions  . Anastrozole   . Tramadol     Medication caused dizziness    Current Outpatient Medications  Medication Sig Dispense Refill  . acetaminophen (TYLENOL) 325 MG tablet Take 2 tablets (650 mg total) by mouth every 6 (six) hours as needed.    . calcium carbonate (OS-CAL) 600 MG TABS Take 600 mg by mouth 2 (two) times daily with a meal.     . cephALEXin (KEFLEX) 500 MG capsule Take 1 capsule (500 mg total) by mouth 2 (two) times daily. 10 capsule 1  . Cholecalciferol (VITAMIN D) 1000 UNITS capsule Take 1,000 Units by mouth 2 (two) times daily.     Marland Kitchen erythromycin ophthalmic ointment Apply 1 cm ribbon in affected eye(s) nightly for 4-7 nights 3.5 g 0  .  LYSINE HCL PO Take 500 mg by mouth daily.     . meloxicam (MOBIC) 7.5 MG tablet Take 1 pill up to twice daily for pain, can take with tylenol, can not take with aleve, iburpofen, then as needed daily for pain 60 tablet 3  . Multiple Vitamins-Minerals (MULTIVITAMIN PO) Take by mouth daily.    . Omega-3 Fatty Acids (FISH OIL PO) Take by mouth daily. Mega Red     No current facility-administered medications for this visit.     OBJECTIVE: Older white woman in no acute distress  Vitals:   09/01/18 1506  BP: (!) 154/61  Pulse: 67  Resp: 20  Temp: 98.7 F (37.1 C)  SpO2: 100%     Body mass index is 23.8 kg/m.   Wt Readings from Last 3 Encounters:  09/01/18 130 lb 1.6 oz (59 kg)  06/01/18 130 lb 9.6 oz (59.2 kg)  11/04/17 130 lb 8 oz (59.2 kg)      ECOG FS:1 - Symptomatic but completely ambulatory   Sclerae unicteric, EOMs intact Wearing a mask No cervical or supraclavicular adenopathy Lungs no rales or rhonchi Heart  regular rate and rhythm Abd soft, nontender, positive bowel sounds MSK no focal spinal tenderness, no upper extremity lymphedema Neuro: nonfocal, well oriented, appropriate affect Breasts: On the right she is status post remote lumpectomy and radiation.  There is no evidence of disease recurrence.  On the left she is status post more recent lumpectomy and radiation.  There is faint erythema throughout the left breast as imaged below.  There is no palpable lump.  There is no dehiscence or swelling associated with the lumpectomy incision.  There is no axillary adenopathy.   Left Breast 09/01/2018     LAB RESULTS:  CMP     Component Value Date/Time   NA 145 06/01/2018 1450   NA 144 10/24/2014 1426   K 4.1 06/01/2018 1450   K 4.1 10/24/2014 1426   CL 107 06/01/2018 1450   CL 107 06/08/2012 0851   CO2 31 06/01/2018 1450   CO2 29 10/24/2014 1426   GLUCOSE 87 06/01/2018 1450   GLUCOSE 114 10/24/2014 1426   GLUCOSE 107 (H) 06/08/2012 0851   BUN 25 06/01/2018 1450   BUN 13.1 10/24/2014 1426   CREATININE 0.63 06/01/2018 1450   CREATININE 0.7 10/24/2014 1426   CALCIUM 9.7 06/01/2018 1450   CALCIUM 9.3 10/24/2014 1426   PROT 6.1 06/01/2018 1450   PROT 6.1 (L) 10/24/2014 1426   ALBUMIN 4.2 07/23/2017 1510   ALBUMIN 3.5 10/24/2014 1426   AST 17 06/01/2018 1450   AST 19 07/23/2017 1510   AST 18 10/24/2014 1426   ALT 15 06/01/2018 1450   ALT 18 07/23/2017 1510   ALT 16 10/24/2014 1426   ALKPHOS 60 07/23/2017 1510   ALKPHOS 54 10/24/2014 1426   BILITOT 0.4 06/01/2018 1450   BILITOT 0.3 07/23/2017 1510   BILITOT 0.43 10/24/2014 1426   GFRNONAA 80 06/01/2018 1450   GFRAA 93 06/01/2018 1450    No results found for: TOTALPROTELP, ALBUMINELP, A1GS, A2GS, BETS, BETA2SER, GAMS, MSPIKE, SPEI  No results found for: KPAFRELGTCHN, LAMBDASER, KAPLAMBRATIO  Lab Results  Component Value Date   WBC 5.6 06/01/2018   NEUTROABS 3,517 06/01/2018   HGB 12.5 06/01/2018   HCT 37.2 06/01/2018    MCV 98.7 06/01/2018   PLT 225 06/01/2018    @LASTCHEMISTRY @  Lab Results  Component Value Date   LABCA2 37 12/10/2011    No components found  for: [id number]  No results for input(s): INR in the last 168 hours.  Lab Results  Component Value Date   LABCA2 37 12/10/2011    No results found for: GEX528  No results found for: UXL244  No results found for: WNU272  No results found for: CA2729  No components found for: HGQUANT  No results found for: CEA1 / No results found for: CEA1   No results found for: AFPTUMOR  No results found for: [place]  No results found for: PSA1  No visits with results within 3 Day(s) from this visit.  Latest known visit with results is:  Office Visit on 06/01/2018  Component Date Value Ref Range Status  . Glucose, Bld 06/01/2018 87  65 - 99 mg/dL Final   Comment: .            Fasting reference interval .   . BUN 06/01/2018 25  7 - 25 mg/dL Final  . Creat 06/01/2018 0.63  0.60 - 0.88 mg/dL Final   Comment: For patients >21 years of age, the reference limit for Creatinine is approximately 13% higher for people identified as African-American. .   . GFR, Est Non African American 06/01/2018 80  > OR = 60 mL/min/1.87m Final  . GFR, Est African American 06/01/2018 93  > OR = 60 mL/min/1.729mFinal  . BUN/Creatinine Ratio 0553/66/4403OT APPLICABLE  6 - 22 (calc) Final  . Sodium 06/01/2018 145  135 - 146 mmol/L Final  . Potassium 06/01/2018 4.1  3.5 - 5.3 mmol/L Final  . Chloride 06/01/2018 107  98 - 110 mmol/L Final  . CO2 06/01/2018 31  20 - 32 mmol/L Final  . Calcium 06/01/2018 9.7  8.6 - 10.4 mg/dL Final  . Total Protein 06/01/2018 6.1  6.1 - 8.1 g/dL Final  . Albumin 06/01/2018 4.3  3.6 - 5.1 g/dL Final  . Globulin 06/01/2018 1.8* 1.9 - 3.7 g/dL (calc) Final  . AG Ratio 06/01/2018 2.4  1.0 - 2.5 (calc) Final  . Total Bilirubin 06/01/2018 0.4  0.2 - 1.2 mg/dL Final  . Alkaline phosphatase (APISO) 06/01/2018 52  37 - 153 U/L Final   . AST 06/01/2018 17  10 - 35 U/L Final  . ALT 06/01/2018 15  6 - 29 U/L Final  . WBC 06/01/2018 5.6  3.8 - 10.8 Thousand/uL Final  . RBC 06/01/2018 3.77* 3.80 - 5.10 Million/uL Final  . Hemoglobin 06/01/2018 12.5  11.7 - 15.5 g/dL Final  . HCT 06/01/2018 37.2  35.0 - 45.0 % Final  . MCV 06/01/2018 98.7  80.0 - 100.0 fL Final  . MCH 06/01/2018 33.2* 27.0 - 33.0 pg Final  . MCHC 06/01/2018 33.6  32.0 - 36.0 g/dL Final  . RDW 06/01/2018 11.8  11.0 - 15.0 % Final  . Platelets 06/01/2018 225  140 - 400 Thousand/uL Final  . MPV 06/01/2018 11.0  7.5 - 12.5 fL Final  . Neutro Abs 06/01/2018 3,517  1,500 - 7,800 cells/uL Final  . Lymphs Abs 06/01/2018 1,378  850 - 3,900 cells/uL Final  . Absolute Monocytes 06/01/2018 554  200 - 950 cells/uL Final  . Eosinophils Absolute 06/01/2018 112  15 - 500 cells/uL Final  . Basophils Absolute 06/01/2018 39  0 - 200 cells/uL Final  . Neutrophils Relative % 06/01/2018 62.8  % Final  . Total Lymphocyte 06/01/2018 24.6  % Final  . Monocytes Relative 06/01/2018 9.9  % Final  . Eosinophils Relative 06/01/2018 2.0  % Final  . Basophils Relative 06/01/2018  0.7  % Final  . TSH 06/01/2018 1.76  0.40 - 4.50 mIU/L Final  . Cholesterol 06/01/2018 218* <200 mg/dL Final  . HDL 06/01/2018 69  > OR = 50 mg/dL Final  . Triglycerides 06/01/2018 131  <150 mg/dL Final  . LDL Cholesterol (Calc) 06/01/2018 124* mg/dL (calc) Final   Comment: Reference range: <100 . Desirable range <100 mg/dL for primary prevention;   <70 mg/dL for patients with CHD or diabetic patients  with > or = 2 CHD risk factors. Marland Kitchen LDL-C is now calculated using the Martin-Hopkins  calculation, which is a validated novel method providing  better accuracy than the Friedewald equation in the  estimation of LDL-C.  Cresenciano Genre et al. Annamaria Helling. 9485;462(70): 2061-2068  (http://education.[facility].com/faq/FAQ164)   . Total CHOL/HDL Ratio 06/01/2018 3.2  <5.0 (calc) Final  . Non-HDL Cholesterol (Calc)  06/01/2018 149* <130 mg/dL (calc) Final   Comment: For patients with diabetes plus 1 major ASCVD risk  factor, treating to a non-HDL-C goal of <100 mg/dL  (LDL-C of <70 mg/dL) is considered a therapeutic  option.   . Magnesium 06/01/2018 2.0  1.5 - 2.5 mg/dL Final  . Vit D, 25-Hydroxy 06/01/2018 69  30 - 100 ng/mL Final   Comment: Vitamin D Status         25-OH Vitamin D: . Deficiency:                    <20 ng/mL Insufficiency:             20 - 29 ng/mL Optimal:                 > or = 30 ng/mL . For 25-OH Vitamin D testing on patients on  D2-supplementation and patients for whom quantitation  of D2 and D3 fractions is required, the QuestAssureD(TM) 25-OH VIT D, (D2,D3), LC/MS/MS is recommended: order  code [id number] (patients >5yr). See Note 1 . Note 1 . For additional information, please refer to  http://education.[facility].com/faq/FAQ199  (This link is being provided for informational/ educational purposes only.)   . Color, Urine 06/01/2018 YELLOW  YELLOW Final  . APPearance 06/01/2018 CLEAR  CLEAR Final  . Specific Gravity, Urine 06/01/2018 1.019  1.001 - 1.03 Final  . pH 06/01/2018 6.5  5.0 - 8.0 Final  . Glucose, UA 06/01/2018 NEGATIVE  NEGATIVE Final  . Bilirubin Urine 06/01/2018 NEGATIVE  NEGATIVE Final  . Ketones, ur 06/01/2018 NEGATIVE  NEGATIVE Final  . Hgb urine dipstick 06/01/2018 NEGATIVE  NEGATIVE Final  . Protein, ur 06/01/2018 NEGATIVE  NEGATIVE Final  . Nitrite 06/01/2018 NEGATIVE  NEGATIVE Final  . LChalmers Guest05/11/2018 NEGATIVE  NEGATIVE Final  . Creatinine, Urine 06/01/2018 66  20 - 275 mg/dL Final  . Microalb, Ur 06/01/2018 1.2  mg/dL Final   Comment: Reference Range Not established   . Microalb Creat Ratio 06/01/2018 18  <30 mcg/mg creat Final   Comment: . The ADA defines abnormalities in albumin excretion as follows: .Marland KitchenCategory         Result (mcg/mg creatinine) . Normal                    <30 Microalbuminuria         30-299   Clinical albuminuria   > OR = 300 . The ADA recommends that at least two of three specimens collected within a 3-6 month period be abnormal before considering a patient to be within a diagnostic category.     (this displays  the last labs from the last 3 days)  No results found for: TOTALPROTELP, ALBUMINELP, A1GS, A2GS, BETS, BETA2SER, GAMS, MSPIKE, SPEI (this displays SPEP labs)  No results found for: KPAFRELGTCHN, LAMBDASER, KAPLAMBRATIO (kappa/lambda light chains)  No results found for: HGBA, HGBA2QUANT, HGBFQUANT, HGBSQUAN (Hemoglobinopathy evaluation)   No results found for: LDH  Lab Results  Component Value Date   IRON 64 08/09/2014   TIBC 336 08/09/2014   IRONPCTSAT 19 (L) 08/09/2014   (Iron and TIBC)  No results found for: FERRITIN  Urinalysis    Component Value Date/Time   COLORURINE YELLOW 06/01/2018 1450   APPEARANCEUR CLEAR 06/01/2018 1450   LABSPEC 1.019 06/01/2018 1450   PHURINE 6.5 06/01/2018 1450   GLUCOSEU NEGATIVE 06/01/2018 1450   HGBUR NEGATIVE 06/01/2018 1450   BILIRUBINUR NEGATIVE 08/22/2016 0935   KETONESUR NEGATIVE 06/01/2018 1450   PROTEINUR NEGATIVE 06/01/2018 1450   UROBILINOGEN 0.2 08/09/2014 1505   NITRITE NEGATIVE 06/01/2018 1450   LEUKOCYTESUR NEGATIVE 06/01/2018 1450     STUDIES: No results found.  ELIGIBLE FOR AVAILABLE RESEARCH PROTOCOL: no  ASSESSMENT: 85 y.o. [place], [place] woman with a prior history of right-sided breast cancer, now status post left breast upper outer quadrant biopsy 07/01/2017 for a clinical T1c N0, stage IA invasive ductal carcinoma, grade 1, estrogen and progesterone receptor positive, HER-2 not amplified, with an MIB-1 of 50%  (1) status post right lumpectomy June of 2012 for a , stage IA invasive ductal carcinoma, grade 1, with abundant extracellular mucin, and no lymph node involvement.     (a) completed adjuvant radiation August 2012  (b) no additional adjuvant chemotherapy  (c) two right axillary  lymph nodes were removed  (2) Left breast lumpectomy on 07/29/2017: pT1c pNX, stage IA  Invasive ductal carcinoma, grade 1, with negative margins  (3) adjuvant radiation 09/25/2017 - 10/22/2017  (a) Whole Left Breast / 42.5 Gy in 17 fractions  (b) Seroma Boost / 8 Gy in 4 fractions  (4) anastrozole started 09/02/2017, stopped 09/08/2017 with intolerance  (a) patient canceled scheduled bone density scan   PLAN:  Izora Gala developed mild cellulitis of the left breast.  She is not aware of any trauma or other injury to the breast.  This is improving, but I think it would be prudent to go ahead and put her antibiotics and she will take cephalexin 500 mg twice daily for a total of 10 doses, 5 days.  She understands the possible toxicities side effects and complications of this agent and she will call if she has any problems from it.  Otherwise she will call us in a week to let us know whether the problem has resolved or not  Assuming everything is well at that point, she will see me again a year from now.  She knows to call for any other issues that may develop before that visit.   Brion Hedges, [name], MD  09/01/18 3:22 PM Medical Oncology and Hematology [facility] [address] [place], [place] [address] Tel. [phone]    Fax. [phone]   I, Wilburn Mylar, am acting as scribe for Dr. [name]. Jawon Dipiero.  I, Lurline Del MD, have reviewed the above documentation for accuracy and completeness, and I agree with the above.

## 2018-08-31 NOTE — Telephone Encounter (Signed)
Called pt per 8/10 sch message - pt aware of new rescheduled appt.

## 2018-09-01 ENCOUNTER — Inpatient Hospital Stay: Payer: Medicare Other | Attending: Oncology | Admitting: Oncology

## 2018-09-01 ENCOUNTER — Other Ambulatory Visit: Payer: Self-pay

## 2018-09-01 VITALS — BP 154/61 | HR 67 | Temp 98.7°F | Resp 20 | Ht 62.0 in | Wt 130.1 lb

## 2018-09-01 DIAGNOSIS — Z853 Personal history of malignant neoplasm of breast: Secondary | ICD-10-CM | POA: Insufficient documentation

## 2018-09-01 DIAGNOSIS — C50912 Malignant neoplasm of unspecified site of left female breast: Secondary | ICD-10-CM

## 2018-09-01 DIAGNOSIS — Z170 Estrogen receptor positive status [ER+]: Secondary | ICD-10-CM | POA: Diagnosis not present

## 2018-09-01 DIAGNOSIS — C50412 Malignant neoplasm of upper-outer quadrant of left female breast: Secondary | ICD-10-CM

## 2018-09-01 MED ORDER — CEPHALEXIN 500 MG PO CAPS: 500.0000 mg | ORAL_CAPSULE | Freq: Two times a day (BID) | ORAL | 1 refills | Status: DC

## 2018-09-02 ENCOUNTER — Telehealth: Payer: Self-pay | Admitting: Oncology

## 2018-09-02 NOTE — Telephone Encounter (Signed)
I left a message regarding schedule I will mail °

## 2018-09-08 ENCOUNTER — Telehealth: Payer: Self-pay | Admitting: *Deleted

## 2018-09-08 NOTE — Telephone Encounter (Signed)
Received call from pt stating she finished her course of antibiotics for cellulitis of her breast.  Pt states she feels great and is now asymptomatic. Pt educated to call the office if she begins to experience any new symptoms of cellulitis in her breast.  Pt verbalized understanding.

## 2018-09-15 ENCOUNTER — Other Ambulatory Visit: Payer: Self-pay | Admitting: Physician Assistant

## 2018-09-21 NOTE — Progress Notes (Deleted)
Medicare AND FOLLOW UP  Assessment and Plan:   Discussed med's effects and SE's. Screening labs and tests as requested with regular follow-up as recommended.  Future Appointments  Date Time Provider [place]  09/23/2018  2:00 PM Vicie Mutters, PA-C [facility] None  06/08/2019  2:00 PM Vicie Mutters, PA-C [facility] None  09/02/2019  2:00 PM [facility] 1 [facility] None  09/02/2019  2:30 PM Magrinat, [name], MD [facility] None      HPI  85 y.o. female  presents for a medicare wellness and 3 month follow up.   Her blood pressure has been controlled at home, today their BP is  .  She does workout. She denies chest pain, shortness of breath, dizziness.   She has graduated from [facility] cancer center for history of right breast cancer. 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  Stage IA., was on anastrozole and following with Dr. Griffith Citron. Due for MGM next month 06/2018.   She has right knee pain, walking with cane, on advil 3 200mg  pills a day and one tylenol., takes with food. Had mechanical fall. Had injections 6 months ago with Dr. Noemi Chapel that did not help.   She is on cholesterol medication and denies myalgias. Her cholesterol is at goal. The cholesterol last visit was:  Lab Results  Component Value Date   CHOL 218 (H) 06/01/2018   HDL 69 06/01/2018   LDLCALC 124 (H) 06/01/2018   TRIG 131 06/01/2018   CHOLHDL 3.2 06/01/2018  . She has been working on diet and exercise for prediabetes, she is on bASA, she is not on ACE/ARB and denies foot ulcerations, hyperglycemia, hypoglycemia , increased appetite, nausea, paresthesia of the feet, polydipsia, polyuria, visual disturbances, vomiting and weight loss. Last A1C in the office was:  Lab Results  Component Value Date   HGBA1C 5.5 09/11/2017   Patient is on Vitamin D supplement.    Lab Results  Component Value Date   VD25OH 69 06/01/2018     She is seeing her dermatologist every 6 months for skin checks due to history of melanoma.   BMI is There is no height or weight on file to calculate BMI., she is working on diet and exercise. Wt Readings from Last 3 Encounters:  09/01/18 130 lb 1.6 oz (59 kg)  06/01/18 130 lb 9.6 oz (59.2 kg)  11/04/17 130 lb 8 oz (59.2 kg)    Current Medications:  Current Outpatient Medications on File Prior to Visit  Medication Sig Dispense Refill  . acetaminophen (TYLENOL) 325 MG tablet Take 2 tablets (650 mg total) by mouth every 6 (six) hours as needed.    . calcium carbonate (OS-CAL) 600 MG TABS Take 600 mg by mouth 2 (two) times daily with a meal.     . cephALEXin (KEFLEX) 500 MG capsule Take 1 capsule (500 mg total) by mouth 2 (two) times daily. 10 capsule 1  . Cholecalciferol (VITAMIN D) 1000 UNITS capsule Take 1,000 Units by mouth 2 (two) times daily.     Marland Kitchen erythromycin ophthalmic ointment Apply 1 cm ribbon in affected eye(s) nightly for 4-7 nights 3.5 g 0  . LYSINE HCL PO Take 500 mg by mouth daily.     . meloxicam (MOBIC) 7.5 MG tablet Take 1 tablet 1 to 2 x /day with Food as needed for Pain & Inflammation 180 tablet 3  .  Multiple Vitamins-Minerals (MULTIVITAMIN PO) Take by mouth daily.    . Omega-3 Fatty Acids (FISH OIL PO) Take by mouth daily. Mega Red     No current facility-administered medications on file prior to visit.     Health Maintenance:   Immunization History  Administered Date(s) Administered  . Influenza, High Dose Seasonal PF 11/08/2013, 10/04/2014, 11/13/2015, 10/31/2016, 11/12/2017  . Influenza-Unspecified 11/10/2012, 10/04/2014  . Pneumococcal Conjugate-13 07/27/2013  . Pneumococcal-Unspecified 11/30/2010  . Td 02/12/2009  . Zoster 02/12/2006    Tetanus: 2011 Pneumovax: 2012 Prevnar:  2015 Flu vaccine: 2017 Zostavax:  2008  MGM: 06/2017 DEXA: 2011, refuses further Colonoscopy: 2004 declines  further due to age Last Eye Exam:  Dr. Ellie Lunch, 2017 PAP 2014 declines further due to age  Patient Care Team: Unk Pinto, MD as PCP - General (Internal Medicine) [name], MD as Consulting Physician (Gastroenterology) [name], MD as Referring Physician (Orthopedic Surgery) [name], MD as Consulting Physician (Dermatology) Magrinat, [name], MD as Consulting Physician (Oncology) Griselda Miner, MD as Consulting Physician (Dermatology) [name], MD as Consulting Physician (General Surgery)  Medical History:  Past Medical History:  Diagnosis Date  . Arthritis    knees  . Breast cancer ([place]) 2019   Left Breast  . Breast cancer ([place]) 2012   Right Breast  . Cancer ([place]) 2012   right breast cancer  . Hyperlipidemia   . Melanoma ([place]) 2014   right humerus  . Personal history of radiation therapy 2012   Right Breast Cancer  . Personal history of radiation therapy 2019   Left Breast Cancer  . Prediabetes   . Vitamin D deficiency    Allergies Allergies  Allergen Reactions  . Anastrozole   . Tramadol     Medication caused dizziness    SURGICAL HISTORY She  has a past surgical history that includes Cataract extraction (Bilateral, 2009); Cesarean section; Melanoma excision (Right, 2014); Breast biopsy (Right, 05/28/2010); Mastectomy, partial (Left, 07/29/2017); Breast lumpectomy (Right, 06/29/2010); and Breast lumpectomy (Left, 08/08/2017). FAMILY HISTORY Her family history includes Arthritis in her mother; Cancer in her sister; Diabetes in her maternal grandmother; Hypertension in her sister; Rheum arthritis in her mother. SOCIAL HISTORY She  reports that she quit smoking about 42 years ago. She quit after 10.00 years of use. She has never used smokeless tobacco. She reports current alcohol use of about 4.0 standard drinks of alcohol per week. She reports that she does not use drugs.   MEDICARE WELLNESS OBJECTIVES: Physical activity:   Cardiac  risk factors:   Depression/mood screen:   Depression screen [facility] 2/9 09/11/2017  Decreased Interest 0  Down, Depressed, Hopeless 0  PHQ - 2 Score 0    ADLs:  No flowsheet data found.   Cognitive Testing  Alert? Yes  Normal Appearance?Yes  Oriented to person? Yes  Place? Yes   Time? Yes  Recall of three objects?  Yes  Can perform simple calculations? Yes  Displays appropriate judgment?Yes  Can read the correct time from a watch face?Yes  EOL planning:      Review of Systems: Review of Systems  Constitutional: Negative for chills, fever and malaise/fatigue.  HENT: Negative for congestion, ear pain and sore throat.   Eyes: Negative.   Respiratory: Negative for cough, shortness of breath and wheezing.   Cardiovascular: Negative for chest pain, palpitations and leg swelling.  Gastrointestinal: Negative for abdominal pain, blood in stool, constipation, diarrhea, heartburn and melena.  Genitourinary: Negative.   Musculoskeletal: Positive for  falls and joint pain. Negative for back pain, myalgias and neck pain.  Skin: Negative.   Neurological: Negative for dizziness, sensory change, loss of consciousness and headaches.  Psychiatric/Behavioral: Negative for depression. The patient is not nervous/anxious and does not have insomnia.     Physical Exam: Estimated body mass index is 23.8 kg/m as calculated from the following:   Height as of 09/01/18: 5\' 2"  (1.575 m).   Weight as of 09/01/18: 130 lb 1.6 oz (59 kg). There were no vitals taken for this visit.  General Appearance: Well nourished well developed, in no apparent distress.  Eyes: PERRLA, EOMs, left eye with stye external upper lid, conjunctiva no swelling or erythema ENT/Mouth: Ear canals normal without obstruction after removal from left ear, swelling, erythema, or discharge.  TMs normal bilaterally with no erythema, bulging, retraction, or loss of landmark.  Oropharynx moist and clear with no exudate, erythema, or swelling.    Neck: Supple, thyroid normal. No bruits.  No cervical adenopathy Respiratory: Respiratory effort normal, Breath sounds clear A&P without wheeze, rhonchi, rales.   Cardio: RRR without murmurs, rubs or gallops. Brisk peripheral pulses without edema.  Chest: symmetric, with normal excursions Breasts: defer Abdomen: Soft, nontender, no guarding, rebound, hernias, masses, or organomegaly.  Lymphatics: Non tender without lymphadenopathy.  Genitourinary: defer Musculoskeletal: Full ROM all peripheral extremities,5/5 strength, and antalgic gait, negative straight leg Skin: Warm, dry without rashes, lesions, ecchymosis. Neuro: Awake and oriented X 3, Cranial nerves intact, reflexes equal bilaterally. Normal muscle tone, no cerebellar symptoms. Sensation intact.  Psych:  normal affect, Insight and Judgment appropriate.    Medicare Attestation I have personally reviewed: The patient's medical and social history Their use of alcohol, tobacco or illicit drugs Their current medications and supplements The patient's functional ability including ADLs,fall risks, home safety risks, cognitive, and hearing and visual impairment Diet and physical activities Evidence for depression or mood disorders  The patient's weight, height, BMI, and visual acuity have been recorded in the chart.  I have made referrals, counseling, and provided education to the patient based on review of the above and I have provided the patient with a written personalized care plan for preventive services.    Vicie Mutters 11:58 AM [facility]

## 2018-09-23 ENCOUNTER — Ambulatory Visit: Payer: Self-pay | Admitting: Physician Assistant

## 2018-10-01 NOTE — Progress Notes (Signed)
Medicare AND FOLLOW UP  Assessment and Plan: Encounter for Medicare annual wellness exam 1 year  Atherosclerosis of aorta ([place]) Control blood pressure, cholesterol, glucose, increase exercise.   Melanoma in situ of upper arm, right ([place]) Continue follow up derm  Invasive ductal carcinoma of breast, female, left ([place]) Monitor  Essential hypertension -     CBC with Differential/Platelet -     COMPLETE METABOLIC PANEL WITH GFR -     TSH - continue medications, DASH diet, exercise and monitor at home. Call if greater than 130/80.   Hyperlipidemia, unspecified hyperlipidemia type -     Lipid panel check lipids decrease fatty foods increase activity.   Abnormal glucose Discussed disease progression and risks Discussed diet/exercise, weight management and risk modification  Vitamin D deficiency Continue supplement  Encounter for long-term (current) use of medications -     Magnesium  Pancreatic mass Monitor  At high risk for falls Continue cane, continue walking, can send PT as needed.   Unilateral primary osteoarthritis, right knee Continue follow up ortho  Needs flu shot -     Flu vaccine HIGH DOSE PF     Discussed med's effects and SE's. Screening labs and tests as requested with regular follow-up as recommended.  Future Appointments  Date Time Provider [place]  06/08/2019  2:00 PM Vicie Mutters, PA-C [facility] None  09/02/2019  2:00 PM [facility] 1 [facility] None  09/02/2019  2:30 PM Magrinat, [name], MD [facility] None      HPI  85 y.o. female  presents for a medicare wellness and 3 month follow up.   She is still driving, she has some decreased hearing, she does her medications and does her bills. She lives alone. She walks with a cane due to knee pain.   Her blood pressure has been controlled at home, today their BP is BP: 120/68.  She does workout. She denies chest pain, shortness of breath, dizziness.   BMI is Body mass index  is 23.78 kg/m., she is working on diet and exercise. Wt Readings from Last 3 Encounters:  10/06/18 130 lb (59 kg)  09/01/18 130 lb 1.6 oz (59 kg)  06/01/18 130 lb 9.6 oz (59.2 kg)    She has graduated from [facility] cancer center for history of right breast cancer. 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  Stage IA., was on anastrozole and following with Dr. Griffith Citron. Last [facility] 06/2018.   She has right knee pain, walking with cane, on 1-3 tylenol 500mg , takes with food. Follows with Dr. [name].   She is not on cholesterol medicationHer cholesterol is not at goal. The cholesterol last visit was:  Lab Results  Component Value Date   CHOL 218 (H) 06/01/2018   HDL 69 06/01/2018   LDLCALC 124 (H) 06/01/2018   TRIG 131 06/01/2018   CHOLHDL 3.2 06/01/2018  . She has been working on diet and exercise for prediabetes, she is on bASA, she is not on ACE/ARB and denies foot ulcerations, hyperglycemia, hypoglycemia , increased appetite, nausea, paresthesia of the feet, polydipsia, polyuria, visual disturbances, vomiting and weight loss. Last A1C in the office was:  Lab Results  Component Value Date   HGBA1C 5.5 09/11/2017   Patient is on Vitamin D supplement.   Lab Results  Component Value Date   VD25OH 69 06/01/2018     She is seeing  her dermatologist every 6 months for skin checks due to history of melanoma.    Current Medications:  Current Outpatient Medications on File Prior to Visit  Medication Sig Dispense Refill  . acetaminophen (TYLENOL) 325 MG tablet Take 2 tablets (650 mg total) by mouth every 6 (six) hours as needed.    . calcium carbonate (OS-CAL) 600 MG TABS Take 600 mg by mouth 2 (two) times daily with a meal.     . Cholecalciferol (VITAMIN D) 1000 UNITS capsule Take 1,000 Units by mouth 2 (two) times daily.     Marland Kitchen LYSINE HCL PO Take 500 mg by  mouth daily.     . meloxicam (MOBIC) 7.5 MG tablet Take 1 tablet 1 to 2 x /day with Food as needed for Pain & Inflammation 180 tablet 3  . Multiple Vitamins-Minerals (MULTIVITAMIN PO) Take by mouth daily.    . Omega-3 Fatty Acids (FISH OIL PO) Take by mouth daily. Mega Red     No current facility-administered medications on file prior to visit.     Health Maintenance:   Immunization History  Administered Date(s) Administered  . Influenza, High Dose Seasonal PF 11/08/2013, 10/04/2014, 11/13/2015, 10/31/2016, 11/12/2017  . Influenza-Unspecified 11/10/2012, 10/04/2014  . Pneumococcal Conjugate-13 07/27/2013  . Pneumococcal-Unspecified 11/30/2010  . Td 02/12/2009  . Zoster 02/12/2006  . Zoster Recombinat (Shingrix) 09/17/2018    Tetanus: 2011 Pneumovax: 2012 Prevnar:  2015 Flu vaccine: 2019- will get TODAY Zostavax:  2008 Shingrix- got 1st shot, scheduled in Oct to get 2nd.   MGM: 06/2017 DEXA: 2011, refuses further Colonoscopy: 2004 declines further due to age Last Eye Exam:  Dr. Ellie Lunch, 2017 PAP 2014 declines further due to age  Patient Care Team: Unk Pinto, MD as PCP - General (Internal Medicine) [name], MD as Consulting Physician (Gastroenterology) [name], MD as Referring Physician (Orthopedic Surgery) [name], MD as Consulting Physician (Dermatology) Magrinat, [name], MD as Consulting Physician (Oncology) Griselda Miner, MD as Consulting Physician (Dermatology) [name], MD as Consulting Physician (General Surgery)  Medical History:  Past Medical History:  Diagnosis Date  . Arthritis    knees  . Breast cancer ([place]) 2019   Left Breast  . Breast cancer ([place]) 2012   Right Breast  . Cancer ([place]) 2012   right breast cancer  . Hyperlipidemia   . Melanoma ([place]) 2014   right humerus  . Personal history of radiation therapy 2012   Right Breast Cancer  . Personal history of radiation therapy 2019   Left Breast Cancer  .  Prediabetes   . Vitamin D deficiency    Allergies Allergies  Allergen Reactions  . Anastrozole   . Tramadol     Medication caused dizziness    SURGICAL HISTORY She  has a past surgical history that includes Cataract extraction (Bilateral, 2009); Cesarean section; Melanoma excision (Right, 2014); Breast biopsy (Right, 05/28/2010); Mastectomy, partial (Left, 07/29/2017); Breast lumpectomy (Right, 06/29/2010); and Breast lumpectomy (Left, 08/08/2017). FAMILY HISTORY Her family history includes Arthritis in her mother; Cancer in her sister; Diabetes in her maternal grandmother; Hypertension in her sister; Rheum arthritis in her mother. SOCIAL HISTORY She  reports that she quit smoking about 42 years ago. She quit after 10.00 years of use. She has never used smokeless tobacco. She reports current alcohol use of about 4.0 standard drinks of alcohol per week. She reports that she does not use drugs.   MEDICARE WELLNESS OBJECTIVES: Physical activity:   Cardiac risk factors:   Depression/mood screen:  Depression screen PHQ 2/9 10/06/2018  Decreased Interest 0  Down, Depressed, Hopeless 0  PHQ - 2 Score 0    ADLs:  No flowsheet data found.   Cognitive Testing  Alert? Yes  Normal Appearance?Yes  Oriented to person? Yes  Place? Yes   Time? Yes  Recall of three objects?  Yes  Can perform simple calculations? Yes  Displays appropriate judgment?Yes  Can read the correct time from a watch face?Yes  EOL planning: Does Patient Have a Medical Advance Directive?: No Would patient like information on creating a medical advance directive?: No - Patient declined    Review of Systems: Review of Systems  Constitutional: Negative for chills, fever and malaise/fatigue.  HENT: Negative for congestion, ear pain and sore throat.   Eyes: Negative.   Respiratory: Negative for cough, shortness of breath and wheezing.   Cardiovascular: Negative for chest pain, palpitations and leg swelling.   Gastrointestinal: Negative for abdominal pain, blood in stool, constipation, diarrhea, heartburn and melena.  Genitourinary: Negative.   Musculoskeletal: Positive for falls and joint pain. Negative for back pain, myalgias and neck pain.  Skin: Negative.   Neurological: Negative for dizziness, sensory change, loss of consciousness and headaches.  Psychiatric/Behavioral: Negative for depression. The patient is not nervous/anxious and does not have insomnia.     Physical Exam: Estimated body mass index is 23.78 kg/m as calculated from the following:   Height as of this encounter: 5\' 2"  (1.575 m).   Weight as of this encounter: 130 lb (59 kg). BP 120/68   Pulse 66   Temp (!) 97.5 F (36.4 C)   Ht 5\' 2"  (1.575 m)   Wt 130 lb (59 kg)   SpO2 95%   BMI 23.78 kg/m   General Appearance: Well nourished well developed, in no apparent distress.  Eyes: PERRLA, EOMs, left eye with stye external upper lid, conjunctiva no swelling or erythema ENT/Mouth: Ear canals normal without obstruction after removal from left ear, swelling, erythema, or discharge.  TMs normal bilaterally with no erythema, bulging, retraction, or loss of landmark.  Oropharynx moist and clear with no exudate, erythema, or swelling.   Neck: Supple, thyroid normal. No bruits.  No cervical adenopathy Respiratory: Respiratory effort normal, Breath sounds clear A&P without wheeze, rhonchi, rales.   Cardio: RRR without murmurs, rubs or gallops. Brisk peripheral pulses without edema.  Chest: symmetric, with normal excursions Breasts: defer Abdomen: Soft, nontender, no guarding, rebound, hernias, masses, or organomegaly.  Lymphatics: Non tender without lymphadenopathy.  Genitourinary: defer Musculoskeletal: Full ROM all peripheral extremities,5/5 strength, and antalgic gait, negative straight leg Skin: Warm, dry without rashes, lesions, ecchymosis. Neuro: Awake and oriented X 3, Cranial nerves intact, reflexes equal bilaterally.  Normal muscle tone, no cerebellar symptoms. Sensation intact.  Psych:  normal affect, Insight and Judgment appropriate.    Medicare Attestation I have personally reviewed: The patient's medical and social history Their use of alcohol, tobacco or illicit drugs Their current medications and supplements The patient's functional ability including ADLs,fall risks, home safety risks, cognitive, and hearing and visual impairment Diet and physical activities Evidence for depression or mood disorders  The patient's weight, height, BMI, and visual acuity have been recorded in the chart.  I have made referrals, counseling, and provided education to the patient based on review of the above and I have provided the patient with a written personalized care plan for preventive services.    Vicie Mutters 11:42 AM [facility]

## 2018-10-06 ENCOUNTER — Encounter: Payer: Self-pay | Admitting: Physician Assistant

## 2018-10-06 ENCOUNTER — Ambulatory Visit: Payer: Medicare Other | Admitting: Physician Assistant

## 2018-10-06 ENCOUNTER — Other Ambulatory Visit: Payer: Self-pay

## 2018-10-06 VITALS — BP 120/68 | HR 66 | Temp 97.5°F | Ht 62.0 in | Wt 130.0 lb

## 2018-10-06 DIAGNOSIS — D0361 Melanoma in situ of right upper limb, including shoulder: Secondary | ICD-10-CM | POA: Diagnosis not present

## 2018-10-06 DIAGNOSIS — Z23 Encounter for immunization: Secondary | ICD-10-CM

## 2018-10-06 DIAGNOSIS — E785 Hyperlipidemia, unspecified: Secondary | ICD-10-CM | POA: Diagnosis not present

## 2018-10-06 DIAGNOSIS — M1711 Unilateral primary osteoarthritis, right knee: Secondary | ICD-10-CM

## 2018-10-06 DIAGNOSIS — I10 Essential (primary) hypertension: Secondary | ICD-10-CM

## 2018-10-06 DIAGNOSIS — R6889 Other general symptoms and signs: Secondary | ICD-10-CM

## 2018-10-06 DIAGNOSIS — Z0000 Encounter for general adult medical examination without abnormal findings: Secondary | ICD-10-CM

## 2018-10-06 DIAGNOSIS — Z0001 Encounter for general adult medical examination with abnormal findings: Secondary | ICD-10-CM

## 2018-10-06 DIAGNOSIS — K8689 Other specified diseases of pancreas: Secondary | ICD-10-CM

## 2018-10-06 DIAGNOSIS — Z9181 History of falling: Secondary | ICD-10-CM

## 2018-10-06 DIAGNOSIS — R7309 Other abnormal glucose: Secondary | ICD-10-CM

## 2018-10-06 DIAGNOSIS — E559 Vitamin D deficiency, unspecified: Secondary | ICD-10-CM

## 2018-10-06 DIAGNOSIS — C50912 Malignant neoplasm of unspecified site of left female breast: Secondary | ICD-10-CM

## 2018-10-06 DIAGNOSIS — Z79899 Other long term (current) drug therapy: Secondary | ICD-10-CM

## 2018-10-06 DIAGNOSIS — I700 Atherosclerosis of aorta: Secondary | ICD-10-CM

## 2018-10-06 NOTE — Patient Instructions (Addendum)
MAXIMUM AMOUNT OF TYLENOL IN A DAY  You can take tylenol (542m) or tylenol arthritis (6515m with the meloxicam/antiinflammatories.  The max you can take of tylenol a day is 300078maily.  This is a max of 6 pills a day of the regular tyelnol (500m78mOR a max of 4 a day of the tylenol arthritis (650mg49m long as no other medications you are taking contain tylenol.    Ask pharmacist for debrox wax removal kit  Know what a healthy weight is for you (roughly BMI <25) and aim to maintain this  Aim for 7+ servings of fruits and vegetables daily  65-80+ fluid ounces of water or unsweet tea for healthy kidneys  Limit to max 1 drink of alcohol per day; avoid smoking/tobacco  Limit animal fats in diet for cholesterol and heart health - choose grass fed whenever available  Avoid highly processed foods, and foods high in saturated/trans fats  Aim for low stress - take time to unwind and care for your mental health  Aim for 150 min of moderate intensity exercise weekly for heart health, and weights twice weekly for bone health  Aim for 7-9 hours of sleep daily

## 2018-10-07 LAB — CBC WITH DIFFERENTIAL/PLATELET
Absolute Monocytes: 564 cells/uL (ref 200–950)
Basophils Absolute: 39 cells/uL (ref 0–200)
Basophils Relative: 0.8 %
Eosinophils Absolute: 98 cells/uL (ref 15–500)
Eosinophils Relative: 2 %
HCT: 37.7 % (ref 35.0–45.0)
Hemoglobin: 12.6 g/dL (ref 11.7–15.5)
Lymphs Abs: 1490 cells/uL (ref 850–3900)
MCH: 32.6 pg (ref 27.0–33.0)
MCHC: 33.4 g/dL (ref 32.0–36.0)
MCV: 97.7 fL (ref 80.0–100.0)
MPV: 11 fL (ref 7.5–12.5)
Monocytes Relative: 11.5 %
Neutro Abs: 2710 cells/uL (ref 1500–7800)
Neutrophils Relative %: 55.3 %
Platelets: 239 10*3/uL (ref 140–400)
RBC: 3.86 10*6/uL (ref 3.80–5.10)
RDW: 11.5 % (ref 11.0–15.0)
Total Lymphocyte: 30.4 %
WBC: 4.9 10*3/uL (ref 3.8–10.8)

## 2018-10-07 LAB — COMPLETE METABOLIC PANEL WITH GFR
AG Ratio: 1.9 (calc) (ref 1.0–2.5)
ALT: 14 U/L (ref 6–29)
AST: 18 U/L (ref 10–35)
Albumin: 4.2 g/dL (ref 3.6–5.1)
Alkaline phosphatase (APISO): 48 U/L (ref 37–153)
BUN: 17 mg/dL (ref 7–25)
CO2: 28 mmol/L (ref 20–32)
Calcium: 10 mg/dL (ref 8.6–10.4)
Chloride: 106 mmol/L (ref 98–110)
Creat: 0.68 mg/dL (ref 0.60–0.88)
GFR, Est African American: 91 mL/min/{1.73_m2} (ref >=60)
GFR, Est Non African American: 78 mL/min/{1.73_m2} (ref >=60)
Globulin: 2.2 g/dL (calc) (ref 1.9–3.7)
Glucose, Bld: 98 mg/dL (ref 65–99)
Potassium: 4.3 mmol/L (ref 3.5–5.3)
Sodium: 143 mmol/L (ref 135–146)
Total Bilirubin: 0.5 mg/dL (ref 0.2–1.2)
Total Protein: 6.4 g/dL (ref 6.1–8.1)

## 2018-10-07 LAB — MAGNESIUM: Magnesium: 1.9 mg/dL (ref 1.5–2.5)

## 2018-10-07 LAB — TSH: TSH: 1.71 mIU/L (ref 0.40–4.50)

## 2018-10-07 LAB — LIPID PANEL
Cholesterol: 240 mg/dL — ABNORMAL HIGH (ref <200)
HDL: 67 mg/dL (ref >=50)
LDL Cholesterol (Calc): 151 mg/dL (calc) — ABNORMAL HIGH
Non-HDL Cholesterol (Calc): 173 mg/dL (calc) — ABNORMAL HIGH (ref <130)
Total CHOL/HDL Ratio: 3.6 (calc) (ref <5.0)
Triglycerides: 107 mg/dL (ref <150)

## 2019-01-11 DIAGNOSIS — D692 Other nonthrombocytopenic purpura: Secondary | ICD-10-CM | POA: Insufficient documentation

## 2019-01-11 NOTE — Progress Notes (Signed)
FOLLOW UP  Assessment and Plan:  Senile purpura ([place]) Discussed process, protect skin, sunscreen  Atherosclerosis of aorta (HCC) Control blood pressure, cholesterol, glucose, increase exercise.   Melanoma in situ of upper arm, right ([place]) Has 6 month follow up  Malignant neoplasm of upper-outer quadrant of left breast in female, estrogen receptor positive ([place]) Continue to follow with Dr. Griffith Citron  Invasive ductal carcinoma of breast, female, left [facility]) Being monitored  Hyperlipidemia, unspecified hyperlipidemia type Not on meds Will check at may appointment  Vitamin D deficiency Continue supplment  Bilateral leg weakness Likely spinal stenosis, normal exam in the office for her age, can do PT if she would like to keep patient mobile     Discussed med's effects and SE's. Screening labs and tests as requested with regular follow-up as recommended.  Future Appointments  Date Time Provider [place]  06/08/2019  2:00 PM Vicie Mutters, PA-C [facility] None  09/02/2019  2:00 PM [facility] LAB 1 [facility] None  09/02/2019  2:30 PM Magrinat, [name], MD [facility] None  10/11/2019 11:15 AM Vicie Mutters, PA-C [facility] None      HPI  85 y.o. female  presents for a 3 month follow up.   Her blood pressure has been controlled at home, today their BP is BP: 122/70.  She does workout. She denies chest pain, shortness of breath, dizziness.   She states she is feeling weaker in her legs, she is still walking, walks with a cane and is trying to do exercise. She has a walker in the house. She had a fall a year ago and has not had anything since that time. She has bone on bone on right knee. She has not done PT but does exercises on her TV and her granddaughter is PT, gave her exercises.  She is on tylenol 3 a day and not on any mobic.  She has graduated from [facility] cancer center for history of right breast cancer. However on recent routine MGM, found to have LEFT  breast cancer, Left breast lumpectomy on 07/29/2017: Invasive ductal carcinoma, grade 1, 1.6cm, DICS, margins negative, T1C, NX, M0.  Stage IA.,  following with Dr. Griffith Citron.  BMI is Body mass index is 24.33 kg/m., she is working on diet and exercise. Wt Readings from Last 3 Encounters:  01/12/19 133 lb (60.3 kg)  10/06/18 130 lb (59 kg)  09/01/18 130 lb 1.6 oz (59 kg)   She is not on cholesterol medication and denies myalgias. Her cholesterol is not at goal. The cholesterol last visit was:  Lab Results  Component Value Date   CHOL 240 (H) 10/06/2018   HDL 67 10/06/2018   LDLCALC 151 (H) 10/06/2018   TRIG 107 10/06/2018   CHOLHDL 3.6 10/06/2018  . She has been working on diet and exercise for prediabetes, she is on bASA, she is not on ACE/ARB and denies foot ulcerations, hyperglycemia, hypoglycemia , increased appetite, nausea, paresthesia of the feet, polydipsia, polyuria, visual disturbances, vomiting and weight loss. Last A1C in the office was:  Lab Results  Component Value Date   HGBA1C 5.5 09/11/2017   Patient is on Vitamin D supplement.   Lab Results  Component Value Date   VD25OH 69 06/01/2018     She is seeing her dermatologist every 6 months for skin checks due to history of melanoma.    Lab Results  Component Value Date   WBC 4.9 10/06/2018   HGB 12.6 10/06/2018   HCT 37.7 10/06/2018   MCV 97.7  10/06/2018   PLT 239 10/06/2018     Lab Results  Component Value Date   GFRNONAA 78 10/06/2018     Current Medications:      Current Outpatient Medications (Analgesics):  .  acetaminophen (TYLENOL) 325 MG tablet, Take 2 tablets (650 mg total) by mouth every 6 (six) hours as needed. .  meloxicam (MOBIC) 7.5 MG tablet, Take 1 tablet 1 to 2 x /day with Food as needed for Pain & Inflammation   Current Outpatient Medications (Other):  .  calcium carbonate (OS-CAL) 600 MG TABS, Take 600 mg by mouth 2 (two) times daily with a meal.  .  Cholecalciferol (VITAMIN D) 1000  UNITS capsule, Take 1,000 Units by mouth 2 (two) times daily.  Marland Kitchen  LYSINE HCL PO, Take 500 mg by mouth daily.  .  Multiple Vitamins-Minerals (MULTIVITAMIN PO), Take by mouth daily. .  Omega-3 Fatty Acids (FISH OIL PO), Take by mouth daily. Mega Red  Medical History:  Past Medical History:  Diagnosis Date  . Arthritis    knees  . Breast cancer ([place]) 2019   Left Breast  . Breast cancer ([place]) 2012   Right Breast  . Cancer ([place]) 2012   right breast cancer  . Hyperlipidemia   . Melanoma ([place]) 2014   right humerus  . Personal history of radiation therapy 2012   Right Breast Cancer  . Personal history of radiation therapy 2019   Left Breast Cancer  . Prediabetes   . Vitamin D deficiency    Allergies Allergies  Allergen Reactions  . Anastrozole   . Tramadol     Medication caused dizziness    Surgical History: reviewed and unchanged Family History: reviewed and unchanged Social History: reviewed and unchanged   Review of Systems: Review of Systems  Constitutional: Negative for chills, fever and malaise/fatigue.  HENT: Negative for congestion, ear pain and sore throat.   Eyes: Negative.   Respiratory: Negative for cough, shortness of breath and wheezing.   Cardiovascular: Negative for chest pain, palpitations and leg swelling.  Gastrointestinal: Negative for abdominal pain, blood in stool, constipation, diarrhea, heartburn and melena.  Genitourinary: Negative.   Musculoskeletal: Negative for back pain, falls, joint pain, myalgias and neck pain.  Skin: Negative.   Neurological: Positive for weakness (bilateral legs). Negative for dizziness, sensory change, loss of consciousness and headaches.  Psychiatric/Behavioral: Negative for depression. The patient is not nervous/anxious and does not have insomnia.     Physical Exam: Estimated body mass index is 24.33 kg/m as calculated from the following:   Height as of 10/06/18: 5\' 2"  (1.575 m).   Weight as of this encounter: 133  lb (60.3 kg). BP 122/70   Temp (!) 97.5 F (36.4 C)   Wt 133 lb (60.3 kg)   BMI 24.33 kg/m   General Appearance: Well nourished well developed, in no apparent distress.  Eyes: PERRLA, EOMs, left eye with stye external upper lid, conjunctiva no swelling or erythema ENT/Mouth: Ear canals normal without obstruction after removal from left ear, swelling, erythema, or discharge.  TMs normal bilaterally with no erythema, bulging, retraction, or loss of landmark.  Oropharynx moist and clear with no exudate, erythema, or swelling.   Neck: Supple, thyroid normal. No bruits.  No cervical adenopathy Respiratory: Respiratory effort normal, Breath sounds clear A&P without wheeze, rhonchi, rales.   Cardio: RRR without 3/6 systolic murmur, rubs or gallops. Brisk peripheral pulses without edema.  Chest: symmetric, with normal excursions Abdomen: Soft, nontender, no guarding, rebound, hernias,  masses, or organomegaly.  Lymphatics: Non tender without lymphadenopathy.  Musculoskeletal: Full ROM all peripheral extremities,4/5 strength, and slow antalgic gait with cane, negative straight leg Skin: Warm, dry without rashes, lesions, ecchymosis. Neuro: Awake and oriented X 3, Cranial nerves intact, reflexes equal bilaterally. Normal muscle tone, no cerebellar symptoms. Sensation intact.  Psych:  normal affect, Insight and Judgment appropriate.    Vicie Mutters 2:49 PM [facility]

## 2019-01-12 ENCOUNTER — Other Ambulatory Visit: Payer: Self-pay

## 2019-01-12 ENCOUNTER — Ambulatory Visit: Payer: Medicare Other | Admitting: Physician Assistant

## 2019-01-12 ENCOUNTER — Encounter: Payer: Self-pay | Admitting: Physician Assistant

## 2019-01-12 VITALS — BP 122/70 | HR 78 | Temp 97.5°F | Wt 133.0 lb

## 2019-01-12 DIAGNOSIS — I700 Atherosclerosis of aorta: Secondary | ICD-10-CM

## 2019-01-12 DIAGNOSIS — Z170 Estrogen receptor positive status [ER+]: Secondary | ICD-10-CM

## 2019-01-12 DIAGNOSIS — D0361 Melanoma in situ of right upper limb, including shoulder: Secondary | ICD-10-CM

## 2019-01-12 DIAGNOSIS — D692 Other nonthrombocytopenic purpura: Secondary | ICD-10-CM | POA: Diagnosis not present

## 2019-01-12 DIAGNOSIS — C50412 Malignant neoplasm of upper-outer quadrant of left female breast: Secondary | ICD-10-CM

## 2019-01-12 DIAGNOSIS — R29898 Other symptoms and signs involving the musculoskeletal system: Secondary | ICD-10-CM

## 2019-01-12 DIAGNOSIS — C50912 Malignant neoplasm of unspecified site of left female breast: Secondary | ICD-10-CM

## 2019-01-12 DIAGNOSIS — E785 Hyperlipidemia, unspecified: Secondary | ICD-10-CM

## 2019-01-12 DIAGNOSIS — E559 Vitamin D deficiency, unspecified: Secondary | ICD-10-CM

## 2019-01-12 NOTE — Patient Instructions (Addendum)
If you want PT sooner or after the vaccines, let me know and I will put in referral.    Your LDL is the bad cholesterol that can lead to heart attack and stroke. To lower your number you can decrease your fatty foods, red meat, cheese, milk and increase fiber like whole grains and veggies. You can also add a fiber supplement like Citracel or Benefiber, these do not cause gas and bloating and are safe to use.    Spinal Stenosis  Spinal stenosis occurs when the open space (spinal canal) between the bones of your spine (vertebrae) narrows, putting pressure on the spinal cord or nerves. What are the causes? This condition is caused by areas of bone pushing into the central canals of your vertebrae. This condition may be present at birth (congenital), or it may be caused by:  Arthritic deterioration of your vertebrae (spinal degeneration). This usually starts around age 23.  Injury or trauma to the spine.  Tumors in the spine.  Calcium deposits in the spine. What are the signs or symptoms? Symptoms of this condition include:  Pain in the neck or back that is generally worse with activities, particularly when standing and walking.  Numbness, tingling, hot or cold sensations, weakness, or weariness in your legs.  Pain going up and down the leg (sciatica).  Frequent episodes of falling.  A foot-slapping gait that leads to muscle weakness. In more serious cases, you may develop:  Problemspassing stool or passing urine.  Difficulty having sex.  Loss of feeling in part or all of your leg. Symptoms may come on slowly and get worse over time. How is this diagnosed? This condition is diagnosed based on your medical history and a physical exam. Tests will also be done, such as:  MRI.  CT scan.  X-ray. How is this treated? Treatment for this condition often focuses on managing your pain and any other symptoms. Treatment may include:  Practicing good posture to lessen pressure on your  nerves.  Exercising to strengthen muscles, build endurance, improve balance, and maintain good joint movement (range of motion).  Losing weight, if needed.  Taking medicines to reduce swelling, inflammation, or pain.  Assistive devices, such as a corset or brace. In some cases, surgery may be needed. The most common procedure is decompression laminectomy. This is done to remove excess bone that puts pressure on your nerve roots. Follow these instructions at home: Managing pain, stiffness, and swelling  Do all exercises and stretches as told by your health care provider.  Practice good posture. If you were given a brace or a corset, wear it as told by your health care provider.  Do not do any activities that cause pain. Ask your health care provider what activities are safe for you.  Do not lift anything that is heavier than 10 lb (4.5 kg) or the limit that your health care provider tells you.  Maintain a healthy weight. Talk with your health care provider if you need help losing weight.  If directed, apply heat to the affected area as often as told by your health care provider. Use the heat source that your health care provider recommends, such as a moist heat pack or a heating pad. ? Place a towel between your skin and the heat source. ? Leave the heat on for 20-30 minutes. ? Remove the heat if your skin turns bright red. This is especially important if you are not able to feel pain, heat, or cold. You may  have a greater risk of getting burned. General instructions  Take over-the-counter and prescription medicines only as told by your health care provider.  Do not use any products that contain nicotine or tobacco, such as cigarettes and e-cigarettes. If you need help quitting, ask your health care provider.  Eat a healthy diet. This includes plenty of fruits and vegetables, whole grains, and low-fat (lean) protein.  Keep all follow-up visits as told by your health care provider.  This is important. Contact a health care provider if:  Your symptoms do not get better or they get worse.  You have a fever. Get help right away if:  You have new or worse pain in your neck or upper back.  You have severe pain that cannot be controlled with medicines.  You are dizzy.  You have vision problems, blurred vision, or double vision.  You have a severe headache that is worse when you stand.  You have nausea or you vomit.  You develop new or worse numbness or tingling in your back or legs.  You have pain, redness, swelling, or warmth in your arm or leg. Summary  Spinal stenosis occurs when the open space (spinal canal) between the bones of your spine (vertebrae) narrows. This narrowing puts pressure on the spinal cord or nerves.  Spinal stenosis can cause numbness, weakness, or pain in the neck, back, and legs.  This condition may be caused by a birth defect, arthritic deterioration of your vertebrae, injury, tumors, or calcium deposits.  This condition is usually diagnosed with MRIs, CT scans, and X-rays. This information is not intended to replace advice given to you by your health care provider. Make sure you discuss any questions you have with your health care provider. Document Released: 03/30/2003 Document Revised: 12/20/2016 Document Reviewed: 12/13/2015 Elsevier Patient Education  2020 [employer].

## 2019-05-31 ENCOUNTER — Other Ambulatory Visit: Payer: Self-pay | Admitting: Oncology

## 2019-05-31 DIAGNOSIS — Z9889 Other specified postprocedural states: Secondary | ICD-10-CM

## 2019-05-31 DIAGNOSIS — Z853 Personal history of malignant neoplasm of breast: Secondary | ICD-10-CM

## 2019-06-07 NOTE — Progress Notes (Deleted)
CPE AND FOLLOW UP  Assessment and Plan:  CPE Due next year  Vitamin D deficiency Continue supplement  Prediabetes Discussed general issues about diabetes pathophysiology and management., Educational material distributed., Suggested low cholesterol diet., Encouraged aerobic exercise., Discussed foot care., Reminded to get yearly retinal exam. -     Hemoglobin A1c  Hyperlipidemia, unspecified hyperlipidemia type -continue medications, check lipids, decrease fatty foods, increase activity.  -     Lipid panel  Encounter for long-term (current) use of medications  History of right breast cancer Monitor ***  Pancreatic mass Due to age and discussed advanced care planning, patient just wants observation, does not want further test or any treatment  Atherosclerosis of aorta ([place]) Per CT 07/2016 Control blood pressure, cholesterol, glucose, increase exercise.   History of melanoma Continue to monitor, follows derm q59m  Right knee arthritis *** mobic 7.5mg  RX given, continue tyelnol, continue follow up ortho Goal is to keep quality of life and keep moving  No orders of the defined types were placed in this encounter.     Discussed med's effects and SE's. Screening labs and tests as requested with regular follow-up as recommended.  Future Appointments  Date Time Provider [place]  06/08/2019  2:00 PM Liane Comber, NP [facility] None  06/28/2019  1:20 PM [facility] DIAG TOMO 1 [facility] GI-BREAST CE  09/02/2019  2:00 PM [facility] LAB 1 [facility] None  09/02/2019  2:30 PM Magrinat, [name], MD [facility] None  10/11/2019 11:15 AM Vicie Mutters, PA-C [facility] None      HPI  86 y.o. female  presents for a CPE. She has Hyperlipidemia; Abnormal glucose; Vitamin D deficiency; Encounter for long-term (current) use of medications; History of right breast cancer; Pancreatic mass; Essential hypertension; Invasive ductal carcinoma of breast, female, left ([place]); Malignant  neoplasm of upper-outer quadrant of left breast in female, estrogen receptor positive ([place]); Melanoma in situ of upper arm, right ([place]); At high risk for falls; Atherosclerosis of aorta ([place]); Unilateral primary osteoarthritis, right knee; and Senile purpura (HCC) on their problem list.  ***   She has graduated from [facility] cancer center for history of right breast cancer. 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  Stage IA.,  following with Dr. Griffith Citron.  She has right knee pain, walking with cane, on advil 3 200mg  pills a day and one tylenol., takes with food. Had mechanical fall. Had injections with Dr. Noemi Chapel that did not help. ***  She is seeing her dermatologist every 6 months for skin checks due to history of melanoma.    BMI is There is no height or weight on file to calculate BMI., she {HAS HAS [id number] been working on diet and exercise. Wt Readings from Last 3 Encounters:  01/12/19 133 lb (60.3 kg)  10/06/18 130 lb (59 kg)  09/01/18 130 lb 1.6 oz (59 kg)   She has aortic atherosclerosis per outside film CT 07/2017 Her blood pressure {HAS HAS NOT:18834} been controlled at home, today their BP is    She {DOES_DOES [id number] workout. She denies chest pain, shortness of breath, dizziness.   She is on cholesterol medication and denies myalgias. Her cholesterol is at goal. The cholesterol last visit was:  Lab Results  Component Value Date   CHOL 240 (H) 10/06/2018   HDL 67 10/06/2018   LDLCALC 151 (H) 10/06/2018   TRIG 107 10/06/2018  CHOLHDL 3.6 10/06/2018  . She has been working on diet and exercise for prediabetes, she is on bASA, she is not on ACE/ARB and denies foot ulcerations, hyperglycemia, hypoglycemia , increased appetite, nausea, paresthesia of the feet, polydipsia, polyuria, visual disturbances, vomiting and weight loss. Last A1C in  the office was:  Lab Results  Component Value Date   HGBA1C 5.5 09/11/2017     Lab Results  Component Value Date   GFRNONAA 78 10/06/2018   Patient is on Vitamin D supplement.   Lab Results  Component Value Date   VD25OH 69 06/01/2018          Current Medications:  Current Outpatient Medications on File Prior to Visit  Medication Sig Dispense Refill  . acetaminophen (TYLENOL) 325 MG tablet Take 2 tablets (650 mg total) by mouth every 6 (six) hours as needed.    . calcium carbonate (OS-CAL) 600 MG TABS Take 600 mg by mouth 2 (two) times daily with a meal.     . Cholecalciferol (VITAMIN D) 1000 UNITS capsule Take 1,000 Units by mouth 2 (two) times daily.     Marland Kitchen LYSINE HCL PO Take 500 mg by mouth daily.     . meloxicam (MOBIC) 7.5 MG tablet Take 1 tablet 1 to 2 x /day with Food as needed for Pain & Inflammation 180 tablet 3  . Multiple Vitamins-Minerals (MULTIVITAMIN PO) Take by mouth daily.    . Omega-3 Fatty Acids (FISH OIL PO) Take by mouth daily. Mega Red     No current facility-administered medications on file prior to visit.    Health Maintenance:   Immunization History  Administered Date(s) Administered  . Influenza, High Dose Seasonal PF 11/08/2013, 10/04/2014, 11/13/2015, 10/31/2016, 11/12/2017, 10/06/2018  . Influenza-Unspecified 11/10/2012, 10/04/2014  . Pneumococcal Conjugate-13 07/27/2013  . Pneumococcal-Unspecified 11/30/2010  . Td 02/12/2009  . Zoster 02/12/2006  . Zoster Recombinat (Shingrix) 09/17/2018    Tetanus: 2011 *** Pneumovax: 2012 Prevnar:  2015 Flu vaccine: 09/2018 Zostavax:  2008 Shingrix- got 1st shot, scheduled in Oct to get 2nd. *** Covid 19: ***  MGM: 06/2018 PAP 2014 declines further due to age DEXA: 2011, refuses further  Colonoscopy: 2004 declines further due to age  Last Eye Exam:  Dr. Ellie Lunch, 2017   Patient Care Team: Unk Pinto, MD as PCP - General (Internal Medicine) [name], MD as Consulting Physician  (Gastroenterology) [name], MD as Referring Physician (Orthopedic Surgery) [name], MD as Consulting Physician (Dermatology) Magrinat, [name], MD as Consulting Physician (Oncology) Griselda Miner, MD as Consulting Physician (Dermatology) [name], MD as Consulting Physician (General Surgery)  Medical History:  Past Medical History:  Diagnosis Date  . Arthritis    knees  . Breast cancer ([place]) 2019   Left Breast  . Breast cancer ([place]) 2012   Right Breast  . Cancer ([place]) 2012   right breast cancer  . Hyperlipidemia   . Melanoma ([place]) 2014   right humerus  . Personal history of radiation therapy 2012   Right Breast Cancer  . Personal history of radiation therapy 2019   Left Breast Cancer  . Prediabetes   . Vitamin D deficiency    Allergies Allergies  Allergen Reactions  . Anastrozole   . Tramadol     Medication caused dizziness    SURGICAL HISTORY She  has a past surgical history that includes Cataract extraction (Bilateral, 2009); Cesarean section; Melanoma excision (Right, 2014); Breast biopsy (Right, 05/28/2010); Mastectomy, partial (Left, 07/29/2017); Breast lumpectomy (Right, 06/29/2010);  and Breast lumpectomy (Left, 08/08/2017). FAMILY HISTORY Her family history includes Arthritis in her mother; Cancer in her sister; Diabetes in her maternal grandmother; Hypertension in her sister; Rheum arthritis in her mother. SOCIAL HISTORY She  reports that she quit smoking about 42 years ago. She quit after 10.00 years of use. She has never used smokeless tobacco. She reports current alcohol use of about 4.0 standard drinks of alcohol per week. She reports that she does not use drugs.    Review of Systems: *** Review of Systems  Constitutional: Negative for chills, fever and malaise/fatigue.  HENT: Negative for congestion, ear pain and sore throat.   Eyes: Negative.   Respiratory: Negative for cough, shortness of breath and wheezing.   Cardiovascular:  Negative for chest pain, palpitations and leg swelling.  Gastrointestinal: Negative for abdominal pain, blood in stool, constipation, diarrhea, heartburn and melena.  Genitourinary: Negative.   Musculoskeletal: Positive for falls and joint pain. Negative for back pain, myalgias and neck pain.  Skin: Negative.   Neurological: Negative for dizziness, sensory change, loss of consciousness and headaches.  Psychiatric/Behavioral: Negative for depression. The patient is not nervous/anxious and does not have insomnia.     Physical Exam: Estimated body mass index is 24.33 kg/m as calculated from the following:   Height as of 10/06/18: 5\' 2"  (1.575 m).   Weight as of 01/12/19: 133 lb (60.3 kg). There were no vitals taken for this visit.  General Appearance: Well nourished well developed, in no apparent distress.  Eyes: PERRLA, EOMs, left eye with stye external upper lid, conjunctiva no swelling or erythema ENT/Mouth: Ear canals normal without obstruction after removal from left ear, swelling, erythema, or discharge.  TMs normal bilaterally with no erythema, bulging, retraction, or loss of landmark.  Oropharynx moist and clear with no exudate, erythema, or swelling.   Neck: Supple, thyroid normal. No bruits.  No cervical adenopathy Respiratory: Respiratory effort normal, Breath sounds clear A&P without wheeze, rhonchi, rales.   Cardio: RRR without murmurs, rubs or gallops. Brisk peripheral pulses without edema.  Chest: symmetric, with normal excursions Breasts: defer Abdomen: Soft, nontender, no guarding, rebound, hernias, masses, or organomegaly.  Lymphatics: Non tender without lymphadenopathy.  Genitourinary: defer Musculoskeletal: Full ROM all peripheral extremities,5/5 strength, and antalgic gait, negative straight leg Skin: Warm, dry without rashes, lesions, ecchymosis. Neuro: Awake and oriented X 3, Cranial nerves intact, reflexes equal bilaterally. Normal muscle tone, no cerebellar symptoms.  Sensation intact.  Psych:  normal affect, Insight and Judgment appropriate.   EKG ***   Gorden Harms Britani Beattie 1:33 PM [facility]

## 2019-06-08 ENCOUNTER — Encounter: Payer: Medicare Other | Admitting: Physician Assistant

## 2019-06-08 ENCOUNTER — Encounter: Payer: Medicare Other | Admitting: Adult Health

## 2019-06-11 NOTE — Progress Notes (Signed)
CPE AND FOLLOW UP  Assessment and Plan: CPE 1 year  Senile purpura ([place]) -     CBC with Differential/Platelet  Malignant neoplasm of upper-outer quadrant of left breast in female, estrogen receptor positive (HCC) Monitor  Dizziness Other symptoms and signs involving the nervous system -     MR Brain W Wo Contrast; Future ? Sinus- she would prefer to not get the MRI at this time Declines- will get if not better  Atherosclerosis of aorta (HCC) Control blood pressure, cholesterol, glucose, increase exercise.   Melanoma in situ of upper arm, right ([place]) Continue follow up derm  Invasive ductal carcinoma of breast, female, left ([place]) Monitor  Essential hypertension -     CBC with Differential/Platelet -     COMPLETE METABOLIC PANEL WITH GFR -     TSH - continue medications, DASH diet, exercise and monitor at home. Call if greater than 130/80.   Hyperlipidemia, unspecified hyperlipidemia type -     Lipid panel check lipids decrease fatty foods increase activity.   Abnormal glucose Discussed disease progression and risks Discussed diet/exercise, weight management and risk modification  Vitamin D deficiency Continue supplement  Encounter for long-term (current) use of medications -     Magnesium  Pancreatic mass Monitor  At high risk for falls Continue cane, continue walking, can send PT as needed.   Unilateral primary osteoarthritis, right knee Continue follow up ortho   Discussed med's effects and SE's. Screening labs and tests as requested with regular follow-up as recommended.  Future Appointments  Date Time Provider [place]  06/28/2019  1:20 PM [facility] DI[facility] TOMO 1 [facility] CE  09/02/2019  2:00 PM [facility] LAB 1 [facility] None  09/02/2019  2:30 PM Magrinat, [name], MD [facility] None  10/11/2019 11:15 AM Vicie Mutters, PA-C [facility] None      HPI  86 y.o. female  presents for a CPE and 3 month follow up.   She is still  driving, she has some decreased hearing, she does her medications and does her bills. She lives alone. She walks with a cane due to knee pain. Her daughter in law is with her today.   She complains of dizziness in the morning when she wakes up, can stay with her during the day, Not every morning. Has had vertigo in the past and feels similar. No headache with. Started x 2 weeks.  No fever or chills. No tinnitus. She has small cough, has had nose bleed. No double vision. She does have some next stiffness and pain.   She has remote history of smoking, uncontrolled chol, remote history of melanoma and breast cancer.   She also states that her BP has been elevated 152/67, 164/73, 146/62 but then 05/21 109/47. She is not on any medications. today their BP is BP: 126/80.  She does workout. She denies chest pain, shortness of breath, dizziness.   BMI is Body mass index is 22.31 kg/m., she is working on diet and exercise. Wt Readings from Last 3 Encounters:  06/14/19 130 lb (59 kg)  01/12/19 133 lb (60.3 kg)  10/06/18 130 lb (59 kg)   She has graduated from [facility] cancer center for history of right breast cancer. 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  Stage IA., was on anastrozole, off now and still following with Dr. Griffith Citron. Last [facility] 06/2018.   She  has right knee pain, walking with cane, on 1-3 tylenol 500mg , takes with food. Follows with Dr. [name].   She is not on cholesterol medicationHer cholesterol is not at goal. The cholesterol last visit was:  Lab Results  Component Value Date   CHOL 240 (H) 10/06/2018   HDL 67 10/06/2018   LDLCALC 151 (H) 10/06/2018   TRIG 107 10/06/2018   CHOLHDL 3.6 10/06/2018  . She has been working on diet and exercise for prediabetes, she is on bASA, she is not on ACE/ARB and denies foot ulcerations, hyperglycemia,  hypoglycemia , increased appetite, nausea, paresthesia of the feet, polydipsia, polyuria, visual disturbances, vomiting and weight loss. Last A1C in the office was:  Lab Results  Component Value Date   HGBA1C 5.5 09/11/2017   Patient is on Vitamin D supplement.   Lab Results  Component Value Date   VD25OH 69 06/01/2018     She is seeing her dermatologist every 6 months for skin checks due to history of melanoma.    Current Medications:      Current Outpatient Medications (Analgesics):  .  acetaminophen (TYLENOL) 325 MG tablet, Take 2 tablets (650 mg total) by mouth every 6 (six) hours as needed.   Current Outpatient Medications (Other):  .  calcium carbonate (OS-CAL) 600 MG TABS, Take 600 mg by mouth 2 (two) times daily with a meal.  .  Cholecalciferol (VITAMIN D) 1000 UNITS capsule, Take 1,000 Units by mouth 2 (two) times daily.  Marland Kitchen  LYSINE HCL PO, Take 500 mg by mouth daily.  .  Multiple Vitamins-Minerals (MULTIVITAMIN PO), Take by mouth daily. .  Omega-3 Fatty Acids (FISH OIL PO), Take by mouth daily. Mega Red  Health Maintenance:   Immunization History  Administered Date(s) Administered  . Influenza, High Dose Seasonal PF 11/08/2013, 10/04/2014, 11/13/2015, 10/31/2016, 11/12/2017, 10/06/2018  . Influenza-Unspecified 11/10/2012, 10/04/2014  . Pneumococcal Conjugate-13 07/27/2013  . Pneumococcal-Unspecified 11/30/2010  . Td 02/12/2009  . Zoster 02/12/2006  . Zoster Recombinat (Shingrix) 09/17/2018   Tetanus: 2011 Pneumovax: 2012 Prevnar:  2015 Flu vaccine: 2020 Zostavax:  2008 Pfizer completed- will message dates Shingrix- got 1st shot, scheduled in Oct to get 2nd- will confirm  MGM: 06/2018 DEXA: 2011, refuses further Colonoscopy: 2004 declines further due to age Last Eye Exam:  Dr. Ellie Lunch, Dec 2020 PAP 2014 declines further due to age  Patient Care Team: Unk Pinto, MD as PCP - General (Internal Medicine) [name], MD as Consulting Physician  (Gastroenterology) [name], MD as Referring Physician (Orthopedic Surgery) [name], MD as Consulting Physician (Dermatology) Magrinat, [name], MD as Consulting Physician (Oncology) Griselda Miner, MD as Consulting Physician (Dermatology) [name], MD as Consulting Physician (General Surgery)  Medical History:  Past Medical History:  Diagnosis Date  . Arthritis    knees  . Breast cancer ([place]) 2019   Left Breast  . Breast cancer ([place]) 2012   Right Breast  . Cancer ([place]) 2012   right breast cancer  . Hyperlipidemia   . Melanoma ([place]) 2014   right humerus  . Personal history of radiation therapy 2012   Right Breast Cancer  . Personal history of radiation therapy 2019   Left Breast Cancer  . Prediabetes   . Vitamin D deficiency    Allergies Allergies  Allergen Reactions  . Anastrozole   . Tramadol     Medication caused dizziness    SURGICAL HISTORY She  has a past surgical history that includes Cataract extraction (Bilateral, 2009); Cesarean  section; Melanoma excision (Right, 2014); Breast biopsy (Right, 05/28/2010); Mastectomy, partial (Left, 07/29/2017); Breast lumpectomy (Right, 06/29/2010); and Breast lumpectomy (Left, 08/08/2017). FAMILY HISTORY Her family history includes Arthritis in her mother; Cancer in her sister; Diabetes in her maternal grandmother; Hypertension in her sister; Rheum arthritis in her mother. SOCIAL HISTORY She  reports that she quit smoking about 42 years ago. She quit after 10.00 years of use. She has never used smokeless tobacco. She reports current alcohol use of about 4.0 standard drinks of alcohol per week. She reports that she does not use drugs.    Review of Systems: Review of Systems  Constitutional: Negative for chills, fever and malaise/fatigue.  HENT: Negative for congestion, ear pain and sore throat.   Eyes: Negative.   Respiratory: Negative for cough, shortness of breath and wheezing.   Cardiovascular:  Negative for chest pain, palpitations and leg swelling.  Gastrointestinal: Negative for abdominal pain, blood in stool, constipation, diarrhea, heartburn and melena.  Genitourinary: Negative.   Musculoskeletal: Positive for falls and joint pain. Negative for back pain, myalgias and neck pain.  Skin: Negative.   Neurological: Negative for dizziness, sensory change, loss of consciousness and headaches.  Psychiatric/Behavioral: Negative for depression. The patient is not nervous/anxious and does not have insomnia.     Physical Exam: Estimated body mass index is 22.31 kg/m as calculated from the following:   Height as of this encounter: 5\' 4"  ([id number] m).   Weight as of this encounter: 130 lb (59 kg). BP 126/80   Pulse 65   Temp (!) 97.5 F (36.4 C)   Resp 12   Ht 5\' 4"  (1.626 m)   Wt 130 lb (59 kg)   SpO2 98%   BMI 22.31 kg/m   General Appearance: Well nourished well developed, in no apparent distress.  Eyes: PERRLA, EOMs, left eye with stye external upper lid, conjunctiva no swelling or erythema ENT/Mouth: Ear canals normal without obstruction after removal from left ear, swelling, erythema, or discharge.  TMs normal bilaterally with no erythema, bulging, retraction, or loss of landmark.  Oropharynx moist and clear with no exudate, erythema, or swelling.   Neck: Supple, thyroid normal. No bruits.  No cervical adenopathy Respiratory: Respiratory effort normal, Breath sounds clear A&P without wheeze, rhonchi, rales.   Cardio: RRR without murmurs, rubs or gallops. Brisk peripheral pulses without edema.  Chest: symmetric, with normal excursions Breasts: defer Abdomen: Soft, nontender, no guarding, rebound, hernias, masses, or organomegaly.  Lymphatics: Non tender without lymphadenopathy.  Genitourinary: defer Musculoskeletal: Full ROM all peripheral extremities,5/5 strength, and antalgic gait, negative straight leg Skin: Warm, dry without rashes, lesions, ecchymosis. Neuro: Awake and  oriented X 3, Cranial nerves intact, reflexes equal bilaterally. Normal muscle tone, no cerebellar symptoms. Sensation intact.  Psych:  normal affect, Insight and Judgment appropriate.   Vicie Mutters 2:49 PM [facility]

## 2019-06-14 ENCOUNTER — Encounter: Payer: Self-pay | Admitting: Physician Assistant

## 2019-06-14 ENCOUNTER — Other Ambulatory Visit: Payer: Self-pay

## 2019-06-14 ENCOUNTER — Ambulatory Visit: Payer: Medicare PPO | Admitting: Physician Assistant

## 2019-06-14 VITALS — BP 126/80 | HR 65 | Temp 97.5°F | Resp 12 | Ht 64.0 in | Wt 130.0 lb

## 2019-06-14 DIAGNOSIS — R7309 Other abnormal glucose: Secondary | ICD-10-CM

## 2019-06-14 DIAGNOSIS — Z0000 Encounter for general adult medical examination without abnormal findings: Secondary | ICD-10-CM

## 2019-06-14 DIAGNOSIS — I700 Atherosclerosis of aorta: Secondary | ICD-10-CM | POA: Diagnosis not present

## 2019-06-14 DIAGNOSIS — Z136 Encounter for screening for cardiovascular disorders: Secondary | ICD-10-CM | POA: Diagnosis not present

## 2019-06-14 DIAGNOSIS — Z0001 Encounter for general adult medical examination with abnormal findings: Secondary | ICD-10-CM

## 2019-06-14 DIAGNOSIS — Z853 Personal history of malignant neoplasm of breast: Secondary | ICD-10-CM

## 2019-06-14 DIAGNOSIS — K8689 Other specified diseases of pancreas: Secondary | ICD-10-CM

## 2019-06-14 DIAGNOSIS — R42 Dizziness and giddiness: Secondary | ICD-10-CM

## 2019-06-14 DIAGNOSIS — E785 Hyperlipidemia, unspecified: Secondary | ICD-10-CM | POA: Diagnosis not present

## 2019-06-14 DIAGNOSIS — E559 Vitamin D deficiency, unspecified: Secondary | ICD-10-CM | POA: Diagnosis not present

## 2019-06-14 DIAGNOSIS — I10 Essential (primary) hypertension: Secondary | ICD-10-CM | POA: Diagnosis not present

## 2019-06-14 DIAGNOSIS — D692 Other nonthrombocytopenic purpura: Secondary | ICD-10-CM | POA: Diagnosis not present

## 2019-06-14 DIAGNOSIS — D0361 Melanoma in situ of right upper limb, including shoulder: Secondary | ICD-10-CM

## 2019-06-14 DIAGNOSIS — R29818 Other symptoms and signs involving the nervous system: Secondary | ICD-10-CM

## 2019-06-14 DIAGNOSIS — Z79899 Other long term (current) drug therapy: Secondary | ICD-10-CM

## 2019-06-14 DIAGNOSIS — Z170 Estrogen receptor positive status [ER+]: Secondary | ICD-10-CM

## 2019-06-14 DIAGNOSIS — C50912 Malignant neoplasm of unspecified site of left female breast: Secondary | ICD-10-CM

## 2019-06-14 DIAGNOSIS — Z9181 History of falling: Secondary | ICD-10-CM

## 2019-06-14 NOTE — Patient Instructions (Addendum)
Please message me the date of the Idam[place] and the shingrix dates  Use a dropper or use a cap to put peroxide, olive oil,mineral oil or canola oil in the effected ear- 2-3 times a week. Let it soak for 20-30 min then you can take a shower or use a baby bulb with warm water to wash out the ear wax.  Can buy debrox wax removal kit over the counter.  Do not use Qtips   GO SEE YOUR EYE DOCTOR  Will get an MRI of your brain  She was informed to call 911 if she develop any new symptoms such as worsening headaches, episodes of blurred vision, double vision or complete loss of vision or speech difficulties or motor weakness.   HYPERTENSION INFORMATION  Monitor your blood pressure at home, please keep a record and bring that in with you to your next office visit.   Go to the ER if any CP, SOB, nausea, dizziness, severe HA, changes vision/speech   Benign Paroxysmal Positional Vertigo (BPPV)  General Information In Benign Paroxysmal Positional Vertigo (BPPV) dizziness is generally thought to be due to debris which has collected within a part of the inner ear. This debris can be thought of as "ear rocks", although the formal name is "otoconia". Ear rocks are small crystals of calcium carbonate derived from a structure in the ear called the "utricle" (figure to the right ). The symptoms of BPPV include dizziness or vertigo, lightheadedness, imbalance, and nausea. Activities which bring on symptoms will vary among persons, but symptoms are usually followed by a change of position of the head like getting out of bed or rolling over in bed are common "problem" motions .  However if you have worsening HA, changes vision/speech, weakness go to the ER     What can be done? 1) Use two or more pillows at night. Avoid sleeping on the "bad" side. In the morning, get up slowly and sit on the edge of the bed for a minute. 2) Medication prescribed by your doctor.  3) The exercises below, you can do at home to  help you prevent the sensation later in the day.  4) We can refer you to physical therapy at [facility], # [place] treatments: The Brandt-Daroff Exercises are a home method of treating BPPV,and are effective 95% of the time.  These exercises are performed in three sets per day for two weeks. In each set, one performs the maneuver as shown five times. Start sitting upright (position 1). Then move into the side-lying position (position 2), with the head angled upward about halfway. An easy way to remember this is to imagine someone standing about 6 feet in front of you, and just keep looking at their head at all times. Stay in the side-lying position for 30 seconds, or until the dizziness subsides if this is longer, then go back to the sitting position (position 3). Stay there for 30 seconds, and then go to the opposite side (position 4) and follow the same routine.  At home Epley Maneuver This procedure seems to be even more effective than the in-office procedure, perhaps because it is repeated every night for a week.  The method (for the left side) is performed as shown on the figure below.  1) One stays in each of the supine (lying down) positions for 30 seconds, and in the sitting upright position (top) for 1 minute.  2) Thus, once cycle takes 2 1/2 minutes.  3) Typically 3 cycles are performed just prior to going to sleep.  4) It is best to do them at night rather than in the morning or midday, as if one becomes dizzy following the exercises, then it can resolve while one is sleeping.  The mirror image of this procedure is used for the right ear.  Testing/Procedures: HOW TO TAKE YOUR BLOOD PRESSURE:  Rest 5 minutes before taking your blood pressure.  Don't smoke or drink caffeinated beverages for at least 30 minutes before.  Take your blood pressure before (not after) you eat.  Sit comfortably with your back supported and both feet on the floor (don't cross  your legs).  Elevate your arm to heart level on a table or a desk.  Use the proper sized cuff. It should fit smoothly and snugly around your bare upper arm. There should be enough room to slip a fingertip under the cuff. The bottom edge of the cuff should be 1 inch above the crease of the elbow.  There was another study that showed taking your blood pressure medications at night decrease cardiovascular events.  However if you are on a fluid pill, please take this in the morning.   If you are willing, our goal BP is between 120 and 150  Take your medications faithfully as instructed. Maintain a healthy weight. Get at least 150 minutes of aerobic exercise per week. Minimize salt intake. Minimize alcohol intake  DASH Eating Plan DASH stands for "Dietary Approaches to Stop Hypertension." The DASH eating plan is a healthy eating plan that has been shown to reduce high blood pressure (hypertension). Additional health benefits may include reducing the risk of type 2 diabetes mellitus, heart disease, and stroke. The DASH eating plan may also help with weight loss. WHAT DO I NEED TO KNOW ABOUT THE DASH EATING PLAN? For the DASH eating plan, you will follow these general guidelines:  Choose foods with a percent daily value for sodium of less than 5% (as listed on the food label).  Use salt-free seasonings or herbs instead of table salt or sea salt.  Check with your health care provider or pharmacist before using salt substitutes.  Eat lower-sodium products, often labeled as "lower sodium" or "no salt added."  Eat fresh foods.  Eat more vegetables, fruits, and low-fat dairy products.  Choose whole grains. Look for the word "whole" as the first word in the ingredient list.  Choose fish and skinless chicken or [place] more often than red meat. Limit fish, poultry, and meat to 6 oz (170 g) each day.  Limit sweets, desserts, sugars, and sugary drinks.  Choose heart-healthy fats.  Limit  cheese to 1 oz (28 g) per day.  Eat more home-cooked food and less restaurant, buffet, and fast food.  Limit fried foods.  Cook foods using methods other than frying.  Limit canned vegetables. If you do use them, rinse them well to decrease the sodium.  When eating at a restaurant, ask that your food be prepared with less salt, or no salt if possible. WHAT FOODS CAN I EAT? Seek help from a dietitian for individual calorie needs. Grains Whole grain or whole wheat bread. Brown rice. Whole grain or whole wheat pasta. Quinoa, bulgur, and whole grain cereals. Low-sodium cereals. Corn or whole wheat flour tortillas. Whole grain cornbread. Whole grain crackers. Low-sodium crackers. Vegetables Fresh or frozen vegetables (raw, steamed, roasted, or grilled). Low-sodium or reduced-sodium tomato and vegetable juices. Low-sodium or reduced-sodium tomato sauce and paste. Low-sodium  or reduced-sodium canned vegetables.  Fruits All fresh, canned (in natural juice), or frozen fruits. Meat and Other Protein Products Ground beef (85% or leaner), grass-fed beef, or beef trimmed of fat. Skinless chicken or [place]. Ground chicken or [place]. Pork trimmed of fat. All fish and seafood. Eggs. Dried beans, peas, or lentils. Unsalted nuts and seeds. Unsalted canned beans. Dairy Low-fat dairy products, such as skim or 1% milk, 2% or reduced-fat cheeses, low-fat ricotta or cottage cheese, or plain low-fat yogurt. Low-sodium or reduced-sodium cheeses. Fats and Oils Tub margarines without trans fats. Light or reduced-fat mayonnaise and salad dressings (reduced sodium). Avocado. Safflower, olive, or canola oils. Natural peanut or almond butter. Other Unsalted popcorn and pretzels. The items listed above may not be a complete list of recommended foods or beverages. Contact your dietitian for more options. WHAT FOODS ARE NOT RECOMMENDED? Grains White bread. White pasta. White rice. Refined cornbread. Bagels and  croissants. Crackers that contain trans fat. Vegetables Creamed or fried vegetables. Vegetables in a cheese sauce. Regular canned vegetables. Regular canned tomato sauce and paste. Regular tomato and vegetable juices. Fruits Dried fruits. Canned fruit in light or heavy syrup. Fruit juice. Meat and Other Protein Products Fatty cuts of meat. Ribs, chicken wings, bacon, sausage, bologna, salami, chitterlings, fatback, hot dogs, bratwurst, and packaged luncheon meats. Salted nuts and seeds. Canned beans with salt. Dairy Whole or 2% milk, cream, half-and-half, and cream cheese. Whole-fat or sweetened yogurt. Full-fat cheeses or blue cheese. Nondairy creamers and whipped toppings. Processed cheese, cheese spreads, or cheese curds. Condiments Onion and garlic salt, seasoned salt, table salt, and sea salt. Canned and packaged gravies. Worcestershire sauce. Tartar sauce. Barbecue sauce. Teriyaki sauce. Soy sauce, including reduced sodium. Steak sauce. Fish sauce. Oyster sauce. Cocktail sauce. Horseradish. Ketchup and mustard. Meat flavorings and tenderizers. Bouillon cubes. Hot sauce. Tabasco sauce. Marinades. Taco seasonings. Relishes. Fats and Oils Butter, stick margarine, lard, shortening, ghee, and bacon fat. Coconut, palm kernel, or palm oils. Regular salad dressings. Other Pickles and olives. Salted popcorn and pretzels. The items listed above may not be a complete list of foods and beverages to avoid. Contact your dietitian for more information. WHERE CAN I FIND MORE INFORMATION? National Heart, Lung, and Blood Institute: travelstabloid.com Document Released: 12/27/2010 Document Revised: 05/24/2013 Document Reviewed: 11/11/2012 [facility] Patient Information 2015 [place], [place]. This information is not intended to replace advice given to you by your health care provider. Make sure you discuss any questions you have with your health care provider.

## 2019-06-15 LAB — URINALYSIS, ROUTINE W REFLEX MICROSCOPIC
Bilirubin Urine: NEGATIVE
Glucose, UA: NEGATIVE
Hgb urine dipstick: NEGATIVE
Leukocytes,Ua: NEGATIVE
Nitrite: NEGATIVE
Protein, ur: NEGATIVE
Specific Gravity, Urine: 1.025 (ref 1.001–1.03)
pH: 5.5 (ref 5.0–8.0)

## 2019-06-15 LAB — COMPLETE METABOLIC PANEL WITH GFR
AG Ratio: 1.8 (calc) (ref 1.0–2.5)
ALT: 17 U/L (ref 6–29)
AST: 18 U/L (ref 10–35)
Albumin: 4.2 g/dL (ref 3.6–5.1)
Alkaline phosphatase (APISO): 53 U/L (ref 37–153)
BUN: 23 mg/dL (ref 7–25)
CO2: 29 mmol/L (ref 20–32)
Calcium: 9.9 mg/dL (ref 8.6–10.4)
Chloride: 106 mmol/L (ref 98–110)
Creat: 0.67 mg/dL (ref 0.60–0.88)
GFR, Est African American: 90 mL/min/{1.73_m2} (ref >=60)
GFR, Est Non African American: 78 mL/min/{1.73_m2} (ref >=60)
Globulin: 2.3 g/dL (calc) (ref 1.9–3.7)
Glucose, Bld: 93 mg/dL (ref 65–99)
Potassium: 4.1 mmol/L (ref 3.5–5.3)
Sodium: 141 mmol/L (ref 135–146)
Total Bilirubin: 0.5 mg/dL (ref 0.2–1.2)
Total Protein: 6.5 g/dL (ref 6.1–8.1)

## 2019-06-15 LAB — CBC WITH DIFFERENTIAL/PLATELET
Absolute Monocytes: 582 cells/uL (ref 200–950)
Basophils Absolute: 30 cells/uL (ref 0–200)
Basophils Relative: 0.5 %
Eosinophils Absolute: 102 cells/uL (ref 15–500)
Eosinophils Relative: 1.7 %
HCT: 41.5 % (ref 35.0–45.0)
Hemoglobin: 13.4 g/dL (ref 11.7–15.5)
Lymphs Abs: 1758 cells/uL (ref 850–3900)
MCH: 31.8 pg (ref 27.0–33.0)
MCHC: 32.3 g/dL (ref 32.0–36.0)
MCV: 98.3 fL (ref 80.0–100.0)
MPV: 10.9 fL (ref 7.5–12.5)
Monocytes Relative: 9.7 %
Neutro Abs: 3528 cells/uL (ref 1500–7800)
Neutrophils Relative %: 58.8 %
Platelets: 234 10*3/uL (ref 140–400)
RBC: 4.22 10*6/uL (ref 3.80–5.10)
RDW: 11.8 % (ref 11.0–15.0)
Total Lymphocyte: 29.3 %
WBC: 6 10*3/uL (ref 3.8–10.8)

## 2019-06-15 LAB — LIPID PANEL
Cholesterol: 225 mg/dL — ABNORMAL HIGH (ref <200)
HDL: 67 mg/dL (ref >=50)
LDL Cholesterol (Calc): 135 mg/dL (calc) — ABNORMAL HIGH
Non-HDL Cholesterol (Calc): 158 mg/dL (calc) — ABNORMAL HIGH (ref <130)
Total CHOL/HDL Ratio: 3.4 (calc) (ref <5.0)
Triglycerides: 119 mg/dL (ref <150)

## 2019-06-15 LAB — MICROALBUMIN / CREATININE URINE RATIO
Creatinine, Urine: 111 mg/dL (ref 20–275)
Microalb Creat Ratio: 6 mcg/mg creat (ref <30)
Microalb, Ur: 0.7 mg/dL

## 2019-06-15 LAB — VITAMIN D 25 HYDROXY (VIT D DEFICIENCY, FRACTURES): Vit D, 25-Hydroxy: 70 ng/mL (ref 30–100)

## 2019-06-15 LAB — MAGNESIUM: Magnesium: 2.3 mg/dL (ref 1.5–2.5)

## 2019-06-15 LAB — HEMOGLOBIN A1C
Hgb A1c MFr Bld: 5.5 % of total Hgb (ref <5.7)
Mean Plasma Glucose: 111 (calc)
eAG (mmol/L): 6.2 (calc)

## 2019-06-15 LAB — TSH: TSH: 1.45 mIU/L (ref 0.40–4.50)

## 2019-06-28 ENCOUNTER — Ambulatory Visit
Admission: RE | Admit: 2019-06-28 | Discharge: 2019-06-28 | Disposition: A | Discharge status: Home / Self Care | Payer: Medicare Other | Source: Ambulatory Visit | Attending: Oncology | Admitting: Oncology

## 2019-06-28 ENCOUNTER — Other Ambulatory Visit: Payer: Self-pay

## 2019-06-28 ENCOUNTER — Other Ambulatory Visit: Payer: Self-pay | Admitting: Oncology

## 2019-06-28 DIAGNOSIS — Z9889 Other specified postprocedural states: Secondary | ICD-10-CM

## 2019-06-28 DIAGNOSIS — Z853 Personal history of malignant neoplasm of breast: Secondary | ICD-10-CM

## 2019-06-28 DIAGNOSIS — R922 Inconclusive mammogram: Secondary | ICD-10-CM | POA: Diagnosis not present

## 2019-08-09 ENCOUNTER — Other Ambulatory Visit: Payer: Self-pay | Admitting: Oncology

## 2019-08-10 ENCOUNTER — Telehealth: Payer: Self-pay | Admitting: Oncology

## 2019-08-10 NOTE — Telephone Encounter (Signed)
Rescheduled appointment per 7/19 message. Left message on patient's voicemail with new appointment date and time.

## 2019-09-02 ENCOUNTER — Ambulatory Visit: Payer: Medicare Other | Admitting: Oncology

## 2019-09-02 ENCOUNTER — Other Ambulatory Visit: Payer: Self-pay

## 2019-09-02 ENCOUNTER — Other Ambulatory Visit: Payer: Medicare Other

## 2019-09-02 DIAGNOSIS — C50412 Malignant neoplasm of upper-outer quadrant of left female breast: Secondary | ICD-10-CM

## 2019-09-02 DIAGNOSIS — Z170 Estrogen receptor positive status [ER+]: Secondary | ICD-10-CM

## 2019-09-03 ENCOUNTER — Inpatient Hospital Stay: Payer: Medicare PPO

## 2019-09-03 ENCOUNTER — Inpatient Hospital Stay: Payer: Medicare PPO | Admitting: Adult Health

## 2019-09-09 DIAGNOSIS — Z85828 Personal history of other malignant neoplasm of skin: Secondary | ICD-10-CM | POA: Diagnosis not present

## 2019-09-09 DIAGNOSIS — L570 Actinic keratosis: Secondary | ICD-10-CM | POA: Diagnosis not present

## 2019-09-09 DIAGNOSIS — D1801 Hemangioma of skin and subcutaneous tissue: Secondary | ICD-10-CM | POA: Diagnosis not present

## 2019-09-09 DIAGNOSIS — D2262 Melanocytic nevi of left upper limb, including shoulder: Secondary | ICD-10-CM | POA: Diagnosis not present

## 2019-09-09 DIAGNOSIS — Z85820 Personal history of malignant melanoma of skin: Secondary | ICD-10-CM | POA: Diagnosis not present

## 2019-09-09 DIAGNOSIS — L821 Other seborrheic keratosis: Secondary | ICD-10-CM | POA: Diagnosis not present

## 2019-09-09 DIAGNOSIS — L814 Other melanin hyperpigmentation: Secondary | ICD-10-CM | POA: Diagnosis not present

## 2019-10-11 ENCOUNTER — Encounter: Payer: Self-pay | Admitting: Physician Assistant

## 2019-10-11 ENCOUNTER — Ambulatory Visit: Payer: Medicare Other | Admitting: Physician Assistant

## 2019-10-11 ENCOUNTER — Other Ambulatory Visit: Payer: Self-pay

## 2019-10-11 VITALS — BP 122/68 | HR 72 | Temp 97.3°F | Ht 64.0 in | Wt 126.0 lb

## 2019-10-11 DIAGNOSIS — C50912 Malignant neoplasm of unspecified site of left female breast: Secondary | ICD-10-CM | POA: Diagnosis not present

## 2019-10-11 DIAGNOSIS — R6889 Other general symptoms and signs: Secondary | ICD-10-CM

## 2019-10-11 DIAGNOSIS — K8689 Other specified diseases of pancreas: Secondary | ICD-10-CM

## 2019-10-11 DIAGNOSIS — Z79899 Other long term (current) drug therapy: Secondary | ICD-10-CM

## 2019-10-11 DIAGNOSIS — D692 Other nonthrombocytopenic purpura: Secondary | ICD-10-CM | POA: Diagnosis not present

## 2019-10-11 DIAGNOSIS — Z0001 Encounter for general adult medical examination with abnormal findings: Secondary | ICD-10-CM

## 2019-10-11 DIAGNOSIS — Z853 Personal history of malignant neoplasm of breast: Secondary | ICD-10-CM | POA: Diagnosis not present

## 2019-10-11 DIAGNOSIS — I10 Essential (primary) hypertension: Secondary | ICD-10-CM

## 2019-10-11 DIAGNOSIS — C50412 Malignant neoplasm of upper-outer quadrant of left female breast: Secondary | ICD-10-CM | POA: Diagnosis not present

## 2019-10-11 DIAGNOSIS — R7309 Other abnormal glucose: Secondary | ICD-10-CM

## 2019-10-11 DIAGNOSIS — E559 Vitamin D deficiency, unspecified: Secondary | ICD-10-CM | POA: Diagnosis not present

## 2019-10-11 DIAGNOSIS — E785 Hyperlipidemia, unspecified: Secondary | ICD-10-CM

## 2019-10-11 DIAGNOSIS — I700 Atherosclerosis of aorta: Secondary | ICD-10-CM

## 2019-10-11 DIAGNOSIS — Z0000 Encounter for general adult medical examination without abnormal findings: Secondary | ICD-10-CM

## 2019-10-11 DIAGNOSIS — Z170 Estrogen receptor positive status [ER+]: Secondary | ICD-10-CM

## 2019-10-11 NOTE — Progress Notes (Signed)
MEDICARE AND FOLLOW UP  Assessment and Plan: MEDICARE 1 year If she cuts her self on something rusty or dirty she will get it, otherwise maybe next visit Will try to start doing every 6 months  Senile purpura ([place]) -     CBC with Differential/Platelet  Malignant neoplasm of upper-outer quadrant of left breast in female, estrogen receptor positive ([place]) Monitor  Atherosclerosis of aorta (HCC) Control blood pressure, cholesterol, glucose, increase exercise.   Melanoma in situ of upper arm, right ([place]) Continue follow up derm  Invasive ductal carcinoma of breast, female, left ([place]) Monitor  Essential hypertension -     CBC with Differential/Platelet -     COMPLETE METABOLIC PANEL WITH GFR -     TSH - continue medications, DASH diet, exercise and monitor at home. Call if greater than 130/80.   Hyperlipidemia, unspecified hyperlipidemia type -     Lipid panel check lipids decrease fatty foods increase activity.   Abnormal glucose Discussed disease progression and risks Discussed diet/exercise, weight management and risk modification  Vitamin D deficiency Continue supplement  Encounter for long-term (current) use of medications -     Magnesium  Pancreatic mass Monitor  At high risk for falls Continue cane, continue walking, can send PT as needed.   Unilateral primary osteoarthritis, right knee Continue follow up ortho   Discussed med's effects and SE's. Screening labs and tests as requested with regular follow-up as recommended.  No future appointments.   Medicare Attestation I have personally reviewed: The patient's medical and social history Their use of alcohol, tobacco or illicit drugs Their current medications and supplements The patient's functional ability including ADLs,fall risks, home safety risks, cognitive, and hearing and visual impairment Diet and physical activities Evidence for depression or mood disorders  The patient's weight, height,  BMI, and visual acuity have been recorded in the chart.  I have made referrals, counseling, and provided education to the patient based on review of the above and I have provided the patient with a written personalized care plan for preventive services.    MEDICARE WELLNESS OBJECTIVES: Physical activity:   Cardiac risk factors:   Depression/mood screen:   Depression screen [facility] 2/9 10/11/2019  Decreased Interest 0  Down, Depressed, Hopeless 0  PHQ - 2 Score 0    ADLs:  In your present state of health, do you have any difficulty performing the following activities: 10/11/2019  Hearing? Y  Vision? N  Difficulty concentrating or making decisions? N  Walking or climbing stairs? N  Comment uses cane due to right knee  Dressing or bathing? N  Doing errands, shopping? N  Comment she is still driving  Some recent data might be hidden     Cognitive Testing  Alert? Yes  Normal Appearance?Yes  Oriented to person? Yes  Place? Yes   Time? Yes  Recall of three objects?  Yes  Can perform simple calculations? Yes  Displays appropriate judgment?Yes  Can read the correct time from a watch face?Yes  EOL planning: Does Patient Have a Medical Advance Directive?: Yes Type of Advance Directive: Healthcare Power of Attorney, Living will [place] in Chart?: No - copy requested     HPI  86 y.o. female  presents for a medicare and 3 month follow up.   She is still driving, she has some decreased hearing, she does her medications and does her bills. She lives alone. She walks with a cane due to knee pain, uses tylenol and  voltern gel. Her daughter in law is with her today.   She has remote history of smoking, uncontrolled chol, remote history of melanoma and breast cancer.    She is not on any medications. today their BP is BP: 122/68.  She does not workout due to knee pain. She denies chest pain, shortness of breath, dizziness.   BMI is Body mass index is 21.63 kg/m.,  she is working on diet and exercise. Wt Readings from Last 3 Encounters:  10/11/19 126 lb (57.2 kg)  06/14/19 130 lb (59 kg)  01/12/19 133 lb (60.3 kg)   She has graduated from [facility] cancer center for history of right breast cancer. 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  Stage IA., was on anastrozole, off now and still following with Dr. Griffith Citron. Last [facility] 06/2019  She has right knee pain, walking with cane, on 1-3 tylenol 500mg , takes with food. Follows with Dr. [name].   She is not on cholesterol medicationHer cholesterol is not at goal. The cholesterol last visit was:  Lab Results  Component Value Date   CHOL 225 (H) 06/14/2019   HDL 67 06/14/2019   LDLCALC 135 (H) 06/14/2019   TRIG 119 06/14/2019   CHOLHDL 3.4 06/14/2019  . She has been working on diet and exercise for prediabetes, she is on bASA, she is not on ACE/ARB and denies foot ulcerations, hyperglycemia, hypoglycemia , increased appetite, nausea, paresthesia of the feet, polydipsia, polyuria, visual disturbances, vomiting and weight loss. Last A1C in the office was:  Lab Results  Component Value Date   HGBA1C 5.5 06/14/2019   Patient is on Vitamin D supplement.   Lab Results  Component Value Date   VD25OH 29 06/14/2019     She is seeing her dermatologist every 6 months for skin checks due to history of melanoma.    Current Medications:      Current Outpatient Medications (Analgesics):    acetaminophen (TYLENOL) 325 MG tablet, Take 2 tablets (650 mg total) by mouth every 6 (six) hours as needed.   Current Outpatient Medications (Other):    calcium carbonate (OS-CAL) 600 MG TABS, Take 600 mg by mouth 2 (two) times daily with a meal.    Cholecalciferol (VITAMIN D) 1000 UNITS capsule, Take 1,000 Units by mouth 2 (two) times daily.    LYSINE HCL PO, Take 500 mg by mouth  daily.    Multiple Vitamins-Minerals (MULTIVITAMIN PO), Take by mouth daily.   Omega-3 Fatty Acids (FISH OIL PO), Take by mouth daily. Mega Red  Health Maintenance:   Immunization History  Administered Date(s) Administered   Influenza, High Dose Seasonal PF 11/08/2013, 10/04/2014, 11/13/2015, 10/31/2016, 11/12/2017, 10/06/2018   Influenza-Unspecified 11/10/2012, 10/04/2014   PFIZER SARS-COV-2 Vaccination 02/05/2019, 02/26/2019   Pneumococcal Conjugate-13 07/27/2013   Pneumococcal-Unspecified 11/30/2010   Td 02/12/2009   Zoster 02/12/2006   Zoster Recombinat (Shingrix) 09/17/2018, 11/17/2018   Health Maintenance  Topic Date Due   TETANUS/TDAP  02/13/2019   INFLUENZA VACCINE  08/22/2019   DEXA SCAN  Completed   COVID-19 Vaccine  Completed   PNA vac Low Risk Adult  Completed   Tetanus: 2011 Pneumovax: 2012 Prevnar:  2015 Flu vaccine: 2020 Zostavax:  2008 Pfizer completed- will message dates Shingrix- confirmed  MGM: 06/2019 DEXA: 2011, refuses further Colonoscopy: 2004 declines further due to age Last Eye Exam:  Dr. Ellie Lunch, Dec 2020 PAP 2014  declines further due to age  Patient Care Team: Unk Pinto, MD as PCP - General (Internal Medicine) [name], MD as Consulting Physician (Gastroenterology) [name], MD as Referring Physician (Orthopedic Surgery) [name], MD as Consulting Physician (Dermatology) Magrinat, [name], MD as Consulting Physician (Oncology) Griselda Miner, MD as Consulting Physician (Dermatology) [name], MD as Consulting Physician (General Surgery)  Medical History:  Past Medical History:  Diagnosis Date   Arthritis    knees   Breast cancer [facility]) 2019   Left Breast   Breast cancer [facility]) 2012   Right Breast   Cancer [facility]) 2012   right breast cancer   Hyperlipidemia    Melanoma ([place]) 2014   right humerus   Personal history of radiation therapy 2012   Right Breast Cancer   Personal  history of radiation therapy 2019   Left Breast Cancer   Prediabetes    Vitamin D deficiency    Allergies Allergies  Allergen Reactions   Anastrozole    Tramadol     Medication caused dizziness    SURGICAL HISTORY She  has a past surgical history that includes Cataract extraction (Bilateral, 2009); Cesarean section; Melanoma excision (Right, 2014); Breast biopsy (Right, 05/28/2010); Mastectomy, partial (Left, 07/29/2017); Breast lumpectomy (Right, 06/29/2010); and Breast lumpectomy (Left, 08/08/2017). FAMILY HISTORY Her family history includes Arthritis in her mother; Cancer in her sister; Diabetes in her maternal grandmother; Hypertension in her sister; Rheum arthritis in her mother. SOCIAL HISTORY She  reports that she quit smoking about 43 years ago. She quit after 10.00 years of use. She has never used smokeless tobacco. She reports current alcohol use of about 4.0 standard drinks of alcohol per week. She reports that she does not use drugs.    Review of Systems: Review of Systems  Constitutional: Negative for chills, fever and malaise/fatigue.  HENT: Negative for congestion, ear pain and sore throat.   Eyes: Negative.   Respiratory: Negative for cough, shortness of breath and wheezing.   Cardiovascular: Negative for chest pain, palpitations and leg swelling.  Gastrointestinal: Negative for abdominal pain, blood in stool, constipation, diarrhea, heartburn and melena.  Genitourinary: Negative.   Musculoskeletal: Positive for falls and joint pain. Negative for back pain, myalgias and neck pain.  Skin: Negative.   Neurological: Negative for dizziness, sensory change, loss of consciousness and headaches.  Psychiatric/Behavioral: Negative for depression. The patient is not nervous/anxious and does not have insomnia.     Physical Exam: Estimated body mass index is 21.63 kg/m as calculated from the following:   Height as of this encounter: 5\' 4"  (1.626 m).   Weight as of this  encounter: 126 lb (57.2 kg). BP 122/68    Pulse 72    Temp (!) 97.3 F (36.3 C)    Ht 5\' 4"  (1.626 m)    Wt 126 lb (57.2 kg)    SpO2 98%    BMI 21.63 kg/m   General Appearance: Well nourished well developed, in no apparent distress.  Eyes: PERRLA, EOMs, left eye with stye external upper lid, conjunctiva no swelling or erythema ENT/Mouth: Ear canals normal without obstruction after removal from left ear, swelling, erythema, or discharge.  TMs normal bilaterally with no erythema, bulging, retraction, or loss of landmark.  Oropharynx moist and clear with no exudate, erythema, or swelling.   Neck: Supple, thyroid normal. No bruits.  No cervical adenopathy Respiratory: Respiratory effort normal, Breath sounds clear A&P without wheeze, rhonchi, rales.   Cardio: RRR without murmurs, rubs or  gallops. Brisk peripheral pulses without edema.  Chest: symmetric, with normal excursions Breasts: defer Abdomen: Soft, nontender, no guarding, rebound, hernias, masses, or organomegaly.  Lymphatics: Non tender without lymphadenopathy.  Genitourinary: defer Musculoskeletal: Full ROM all peripheral extremities,5/5 strength, and antalgic gait, negative straight leg Skin: Warm, dry without rashes, lesions, ecchymosis. Neuro: Awake and oriented X 3, Cranial nerves intact, reflexes equal bilaterally. Normal muscle tone, no cerebellar symptoms. Sensation intact.  Psych:  normal affect, Insight and Judgment appropriate.   Vicie Mutters 12:05 PM [facility]

## 2019-10-25 DIAGNOSIS — R32 Unspecified urinary incontinence: Secondary | ICD-10-CM | POA: Diagnosis not present

## 2019-10-25 DIAGNOSIS — E785 Hyperlipidemia, unspecified: Secondary | ICD-10-CM | POA: Diagnosis not present

## 2019-10-25 DIAGNOSIS — Z853 Personal history of malignant neoplasm of breast: Secondary | ICD-10-CM | POA: Diagnosis not present

## 2019-10-25 DIAGNOSIS — M1990 Unspecified osteoarthritis, unspecified site: Secondary | ICD-10-CM | POA: Diagnosis not present

## 2019-10-25 DIAGNOSIS — Z833 Family history of diabetes mellitus: Secondary | ICD-10-CM | POA: Diagnosis not present

## 2019-10-25 DIAGNOSIS — Z8249 Family history of ischemic heart disease and other diseases of the circulatory system: Secondary | ICD-10-CM | POA: Diagnosis not present

## 2019-10-25 DIAGNOSIS — K5900 Constipation, unspecified: Secondary | ICD-10-CM | POA: Diagnosis not present

## 2019-10-25 DIAGNOSIS — G8929 Other chronic pain: Secondary | ICD-10-CM | POA: Diagnosis not present

## 2019-10-25 DIAGNOSIS — Z809 Family history of malignant neoplasm, unspecified: Secondary | ICD-10-CM | POA: Diagnosis not present

## 2020-01-13 ENCOUNTER — Encounter: Payer: Self-pay | Admitting: Adult Health Nurse Practitioner

## 2020-01-13 ENCOUNTER — Other Ambulatory Visit: Payer: Self-pay

## 2020-01-13 ENCOUNTER — Ambulatory Visit: Payer: Medicare PPO | Admitting: Adult Health Nurse Practitioner

## 2020-01-13 VITALS — BP 120/64 | HR 63 | Temp 97.6°F | Wt 128.0 lb

## 2020-01-13 DIAGNOSIS — R7309 Other abnormal glucose: Secondary | ICD-10-CM | POA: Diagnosis not present

## 2020-01-13 DIAGNOSIS — R300 Dysuria: Secondary | ICD-10-CM | POA: Diagnosis not present

## 2020-01-13 DIAGNOSIS — H35371 Puckering of macula, right eye: Secondary | ICD-10-CM | POA: Diagnosis not present

## 2020-01-13 DIAGNOSIS — K8689 Other specified diseases of pancreas: Secondary | ICD-10-CM | POA: Diagnosis not present

## 2020-01-13 DIAGNOSIS — I700 Atherosclerosis of aorta: Secondary | ICD-10-CM

## 2020-01-13 DIAGNOSIS — D692 Other nonthrombocytopenic purpura: Secondary | ICD-10-CM | POA: Diagnosis not present

## 2020-01-13 DIAGNOSIS — I10 Essential (primary) hypertension: Secondary | ICD-10-CM

## 2020-01-13 DIAGNOSIS — E559 Vitamin D deficiency, unspecified: Secondary | ICD-10-CM | POA: Diagnosis not present

## 2020-01-13 DIAGNOSIS — Z170 Estrogen receptor positive status [ER+]: Secondary | ICD-10-CM

## 2020-01-13 DIAGNOSIS — H52203 Unspecified astigmatism, bilateral: Secondary | ICD-10-CM | POA: Diagnosis not present

## 2020-01-13 DIAGNOSIS — E785 Hyperlipidemia, unspecified: Secondary | ICD-10-CM | POA: Diagnosis not present

## 2020-01-13 DIAGNOSIS — Z9181 History of falling: Secondary | ICD-10-CM

## 2020-01-13 DIAGNOSIS — C50412 Malignant neoplasm of upper-outer quadrant of left female breast: Secondary | ICD-10-CM | POA: Diagnosis not present

## 2020-01-13 DIAGNOSIS — Z961 Presence of intraocular lens: Secondary | ICD-10-CM | POA: Diagnosis not present

## 2020-01-13 DIAGNOSIS — M1711 Unilateral primary osteoarthritis, right knee: Secondary | ICD-10-CM

## 2020-01-13 NOTE — Progress Notes (Signed)
FOLLOW UP 3 MONTH  Assessment and Plan:  Senile purpura ([place]) -     CBC with Differential/Platelet  Malignant neoplasm of upper-outer quadrant of left breast in female, estrogen receptor positive (HCC) Monitor  Dysuria -UA + reflex culture Increase water intake  Atherosclerosis of aorta ([place]) Per CT 08/13/2016 Control blood pressure, cholesterol, glucose, increase exercise.   Melanoma in situ of upper arm, right ([place]) Continue follow up derm  Invasive ductal carcinoma of breast, female, left ([place]) Monitor  Essential hypertension -     CBC with Differential/Platelet -     COMPLETE METABOLIC PANEL WITH GFR -     TSH - continue medications, DASH diet, exercise and monitor at home. Call if greater than 130/80.   Hyperlipidemia, unspecified hyperlipidemia type -     Lipid panel check lipids decrease fatty foods increase activity.   Abnormal glucose Discussed disease progression and risks Discussed diet/exercise, weight management and risk modification  Vitamin D deficiency Continue supplement  Encounter for long-term (current) use of medications -     Magnesium  Pancreatic mass Monitor  At high risk for falls Continue cane, continue walking, can send PT as needed.   Unilateral primary osteoarthritis, right knee Continue follow up ortho   Discussed med's effects and SE's. Screening labs and tests as requested with regular follow-up as recommended.  Future Appointments  Date Time Provider [place]  06/14/2020  9:00 AM Liane Comber, NP [facility] None    HPI  86 y.o. female  presents for a medicare and 3 month follow up.   Reports overall she is doing well.  She does not have any health or medication concerns today.  She is using Voltaren gel to her right knee, she also takes tyleno for thisl.  She is still driving, she has some decreased hearing, she does her medications and does her bills. She lives alone. She walks with a cane due to knee  pain, uses tylenol and voltern gel. Her daughter in law is with her today.   She has remote history of smoking, uncontrolled chol, remote history of melanoma and breast cancer.    She is not on any medications. today their BP is BP: 120/64.  She does not workout due to knee pain. She denies chest pain, shortness of breath, dizziness.   BMI is Body mass index is 21.97 kg/m., she is working on diet and exercise. Wt Readings from Last 3 Encounters:  01/13/20 128 lb (58.1 kg)  10/11/19 126 lb (57.2 kg)  06/14/19 130 lb (59 kg)   She has graduated from [facility] cancer center for history of right breast cancer. 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  Stage IA., was on anastrozole, off now and still following with Dr. Griffith Citron. Last [facility] 06/2019  She has right knee pain, walking with cane, on 1-3 tylenol 500mg , takes with food. Follows with Dr. [name].   She is not on cholesterol medicationHer cholesterol is not at goal. The cholesterol last visit was:  Lab Results  Component Value Date   CHOL 225 (H) 06/14/2019   HDL 67 06/14/2019   LDLCALC 135 (H) 06/14/2019   TRIG 119 06/14/2019   CHOLHDL 3.4 06/14/2019  . She has been working on diet and exercise for prediabetes, she is on bASA, she is not on ACE/ARB and denies foot ulcerations, hyperglycemia, hypoglycemia , increased appetite, nausea, paresthesia  of the feet, polydipsia, polyuria, visual disturbances, vomiting and weight loss. Last A1C in the office was:  Lab Results  Component Value Date   HGBA1C 5.5 06/14/2019   Patient is on Vitamin D supplement.   Lab Results  Component Value Date   VD25OH 57 06/14/2019     She is seeing her dermatologist every 6 months for skin checks due to history of melanoma.    Current Medications:      Current Outpatient Medications (Analgesics):     acetaminophen (TYLENOL) 325 MG tablet, Take 2 tablets (650 mg total) by mouth every 6 (six) hours as needed.   Current Outpatient Medications (Other):    calcium carbonate (OS-CAL) 600 MG TABS, Take 600 mg by mouth 2 (two) times daily with a meal.    Cholecalciferol (VITAMIN D) 1000 UNITS capsule, Take 1,000 Units by mouth 2 (two) times daily.    LYSINE HCL PO, Take 500 mg by mouth daily.    Multiple Vitamins-Minerals (MULTIVITAMIN PO), Take by mouth daily.   Omega-3 Fatty Acids (FISH OIL PO), Take by mouth daily. Mega Red  Health Maintenance:   Immunization History  Administered Date(s) Administered   Influenza, High Dose Seasonal PF 11/08/2013, 10/04/2014, 11/13/2015, 10/31/2016, 11/12/2017, 10/06/2018   Influenza-Unspecified 11/10/2012, 10/04/2014   PFIZER SARS-COV-2 Vaccination 02/05/2019, 02/26/2019   Pneumococcal Conjugate-13 07/27/2013   Pneumococcal-Unspecified 11/30/2010   Td 02/12/2009   Zoster 02/12/2006   Zoster Recombinat (Shingrix) 09/17/2018, 11/17/2018   Health Maintenance  Topic Date Due   TETANUS/TDAP  02/13/2019   COVID-19 Vaccine (3 - Pfizer risk 4-dose series) 03/26/2019   INFLUENZA VACCINE  08/22/2019   DEXA SCAN  Completed   PNA vac Low Risk Adult  Completed   Tetanus: 2011 Pneumovax: 2012 Prevnar:  2015 Flu vaccine: 2020 Zostavax:  2008 Pfizer completed- will message dates Shingrix- confirmed  MGM: 06/2019 DEXA: 2011, refuses further Colonoscopy: 2004 declines further due to age Last Eye Exam:  Dr. Ellie Lunch, Dec 2020 PAP 2014 declines further due to age  Patient Care Team: Unk Pinto, MD as PCP - General (Internal Medicine) [name], MD as Consulting Physician (Gastroenterology) [name], MD as Referring Physician (Orthopedic Surgery) [name], MD as Consulting Physician (Dermatology) Magrinat, [name], MD as Consulting Physician (Oncology) Griselda Miner, MD as Consulting Physician  (Dermatology) [name], MD as Consulting Physician (General Surgery)  Medical History:  Past Medical History:  Diagnosis Date   Arthritis    knees   Breast cancer [facility]) 2019   Left Breast   Breast cancer [facility]) 2012   Right Breast   Cancer [facility]) 2012   right breast cancer   Hyperlipidemia    Melanoma ([place]) 2014   right humerus   Personal history of radiation therapy 2012   Right Breast Cancer   Personal history of radiation therapy 2019   Left Breast Cancer   Prediabetes    Vitamin D deficiency    Allergies Allergies  Allergen Reactions   Anastrozole    Tramadol     Medication caused dizziness    SURGICAL HISTORY She  has a past surgical history that includes Cataract extraction (Bilateral, 2009); Cesarean section; Melanoma excision (Right, 2014); Breast biopsy (Right, 05/28/2010); Mastectomy, partial (Left, 07/29/2017); Breast lumpectomy (Right, 06/29/2010); and Breast lumpectomy (Left, 08/08/2017). FAMILY HISTORY Her family history includes Arthritis in her mother; Cancer in her sister; Diabetes in her maternal grandmother; Hypertension in her sister; Rheum arthritis in her mother. SOCIAL HISTORY She  reports that she quit smoking  about 43 years ago. She quit after 10.00 years of use. She has never used smokeless tobacco. She reports current alcohol use of about 4.0 standard drinks of alcohol per week. She reports that she does not use drugs.    Review of Systems: Review of Systems  Constitutional: Negative for chills, fever and malaise/fatigue.  HENT: Negative for congestion, ear pain and sore throat.   Eyes: Negative.   Respiratory: Negative for cough, shortness of breath and wheezing.   Cardiovascular: Negative for chest pain, palpitations and leg swelling.  Gastrointestinal: Negative for abdominal pain, blood in stool, constipation, diarrhea, heartburn and melena.  Genitourinary: Negative.   Musculoskeletal: Positive for falls and joint pain.  Negative for back pain, myalgias and neck pain.  Skin: Negative.   Neurological: Negative for dizziness, sensory change, loss of consciousness and headaches.  Psychiatric/Behavioral: Negative for depression. The patient is not nervous/anxious and does not have insomnia.     Physical Exam: Estimated body mass index is 21.97 kg/m as calculated from the following:   Height as of 10/11/19: 5\' 4"  (1.626 m).   Weight as of this encounter: 128 lb (58.1 kg). BP 120/64    Pulse 63    Temp 97.6 F (36.4 C)    Wt 128 lb (58.1 kg)    SpO2 97%    BMI 21.97 kg/m   General Appearance: Well nourished well developed, in no apparent distress.  Eyes: PERRLA, EOMs, left eye with stye external upper lid, conjunctiva no swelling or erythema ENT/Mouth: Ear canals normal without obstruction after removal from left ear, swelling, erythema, or discharge.  TMs normal bilaterally with no erythema, bulging, retraction, or loss of landmark.  Oropharynx moist and clear with no exudate, erythema, or swelling.   Neck: Supple, thyroid normal. No bruits.  No cervical adenopathy Respiratory: Respiratory effort normal, Breath sounds clear A&P without wheeze, rhonchi, rales.   Cardio: RRR without murmurs, rubs or gallops. Brisk peripheral pulses without edema.  Chest: symmetric, with normal excursions Breasts: defer Abdomen: Soft, nontender, no guarding, rebound, hernias, masses, or organomegaly.  Lymphatics: Non tender without lymphadenopathy.  Genitourinary: defer Musculoskeletal: Full ROM all peripheral extremities,5/5 strength, and antalgic gait, negative straight leg Skin: Warm, dry without rashes, lesions, ecchymosis. Neuro: Awake and oriented X 3, Cranial nerves intact, reflexes equal bilaterally. Normal muscle tone, no cerebellar symptoms. Sensation intact.  Psych:  normal affect, Insight and Judgment appropriate.   Garnet Sierras, NP 11:03 AM [facility]

## 2020-01-14 LAB — COMPLETE METABOLIC PANEL WITH GFR
AG Ratio: 2 (calc) (ref 1.0–2.5)
ALT: 16 U/L (ref 6–29)
AST: 17 U/L (ref 10–35)
Albumin: 4.3 g/dL (ref 3.6–5.1)
Alkaline phosphatase (APISO): 51 U/L (ref 37–153)
BUN: 17 mg/dL (ref 7–25)
CO2: 30 mmol/L (ref 20–32)
Calcium: 10.1 mg/dL (ref 8.6–10.4)
Chloride: 105 mmol/L (ref 98–110)
Creat: 0.64 mg/dL (ref 0.60–0.88)
GFR, Est African American: 92 mL/min/{1.73_m2} (ref >=60)
GFR, Est Non African American: 79 mL/min/{1.73_m2} (ref >=60)
Globulin: 2.1 g/dL (calc) (ref 1.9–3.7)
Glucose, Bld: 93 mg/dL (ref 65–99)
Potassium: 4.2 mmol/L (ref 3.5–5.3)
Sodium: 142 mmol/L (ref 135–146)
Total Bilirubin: 0.5 mg/dL (ref 0.2–1.2)
Total Protein: 6.4 g/dL (ref 6.1–8.1)

## 2020-01-14 LAB — CBC WITH DIFFERENTIAL/PLATELET
Absolute Monocytes: 512 cells/uL (ref 200–950)
Basophils Absolute: 28 cells/uL (ref 0–200)
Basophils Relative: 0.5 %
Eosinophils Absolute: 83 cells/uL (ref 15–500)
Eosinophils Relative: 1.5 %
HCT: 38.1 % (ref 35.0–45.0)
Hemoglobin: 12.7 g/dL (ref 11.7–15.5)
Lymphs Abs: 1898 cells/uL (ref 850–3900)
MCH: 32.8 pg (ref 27.0–33.0)
MCHC: 33.3 g/dL (ref 32.0–36.0)
MCV: 98.4 fL (ref 80.0–100.0)
MPV: 10.9 fL (ref 7.5–12.5)
Monocytes Relative: 9.3 %
Neutro Abs: 2981 cells/uL (ref 1500–7800)
Neutrophils Relative %: 54.2 %
Platelets: 226 10*3/uL (ref 140–400)
RBC: 3.87 10*6/uL (ref 3.80–5.10)
RDW: 11.5 % (ref 11.0–15.0)
Total Lymphocyte: 34.5 %
WBC: 5.5 10*3/uL (ref 3.8–10.8)

## 2020-01-14 LAB — URINALYSIS W MICROSCOPIC + REFLEX CULTURE

## 2020-01-14 LAB — NO CULTURE INDICATED

## 2020-02-03 IMAGING — MG DIGITAL SCREENING BILATERAL MAMMOGRAM WITH TOMO AND CAD
6 series · 6 of 18 positions shown · non-contrast
Comparison: Previous exam(s).

CLINICAL DATA: Screening.

EXAM:
DIGITAL SCREENING BILATERAL MAMMOGRAM WITH TOMO AND CAD

[L MLO synth-2D]
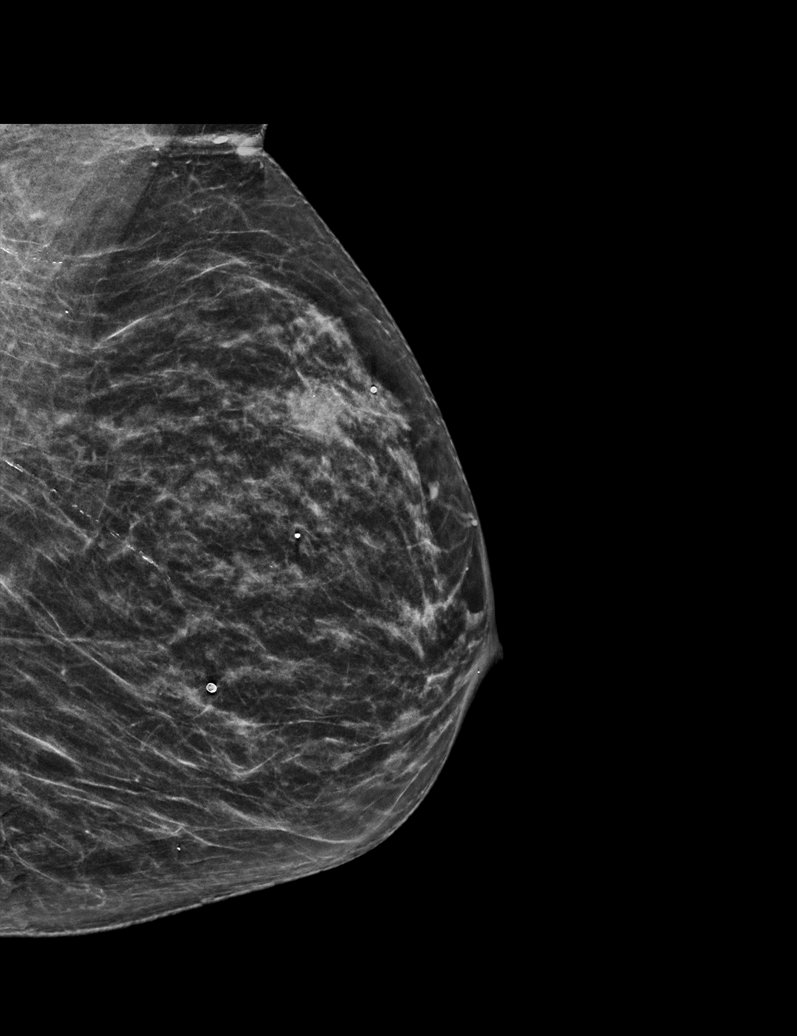

[L CC synth-2D]
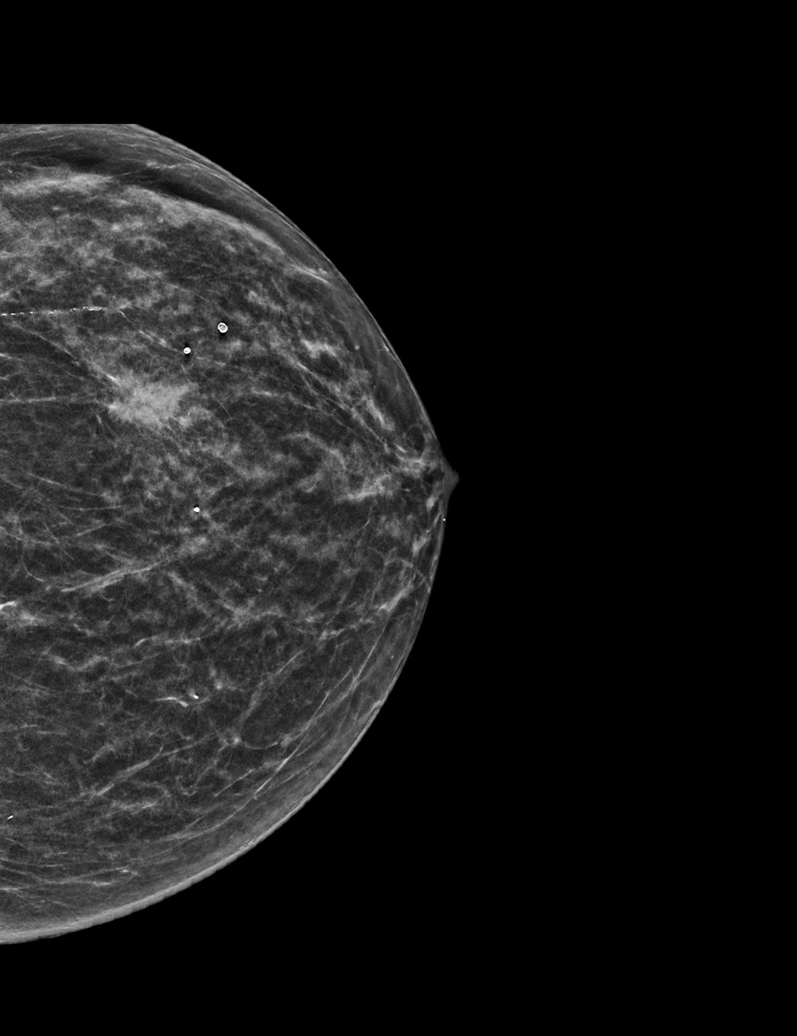

[R MLO synth-2D]
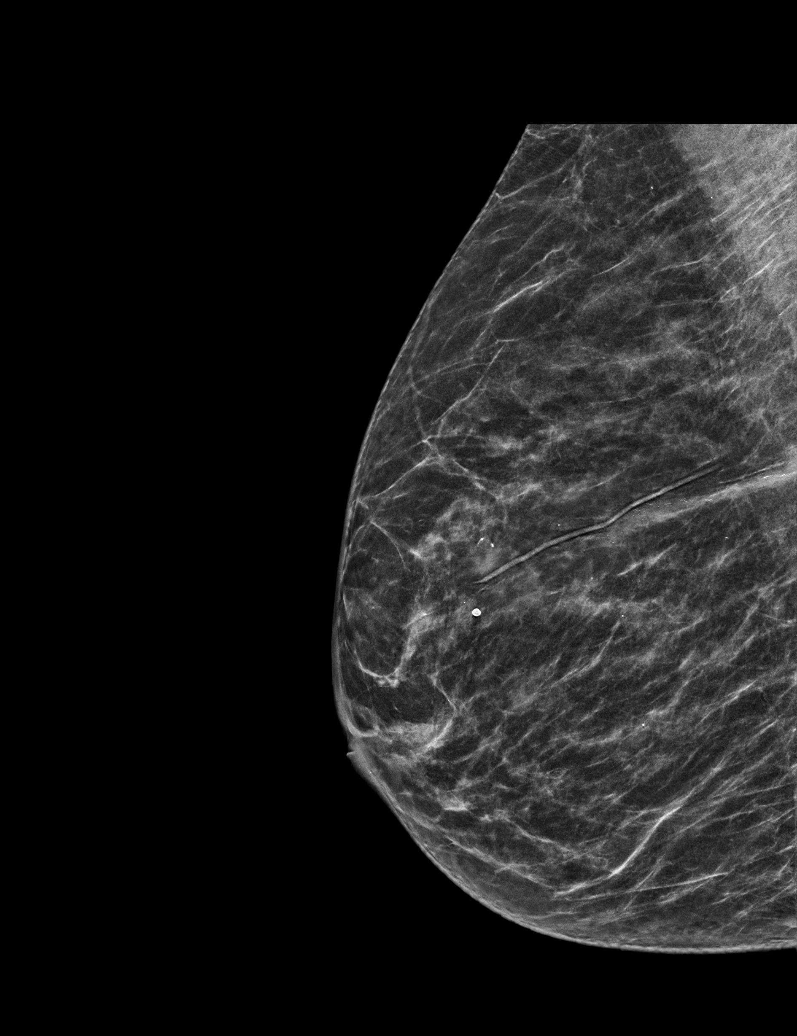

[R MLO tomo · tomo slice 25/49.0]
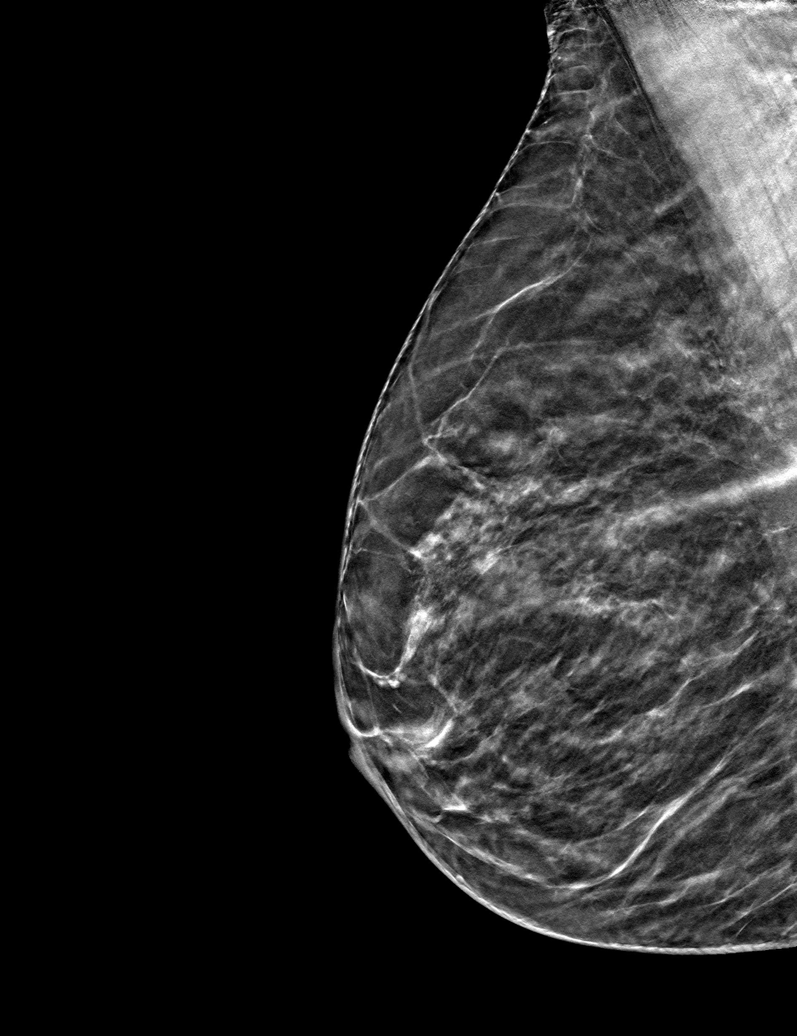

[L CC tomo · tomo slice 22/43.0]
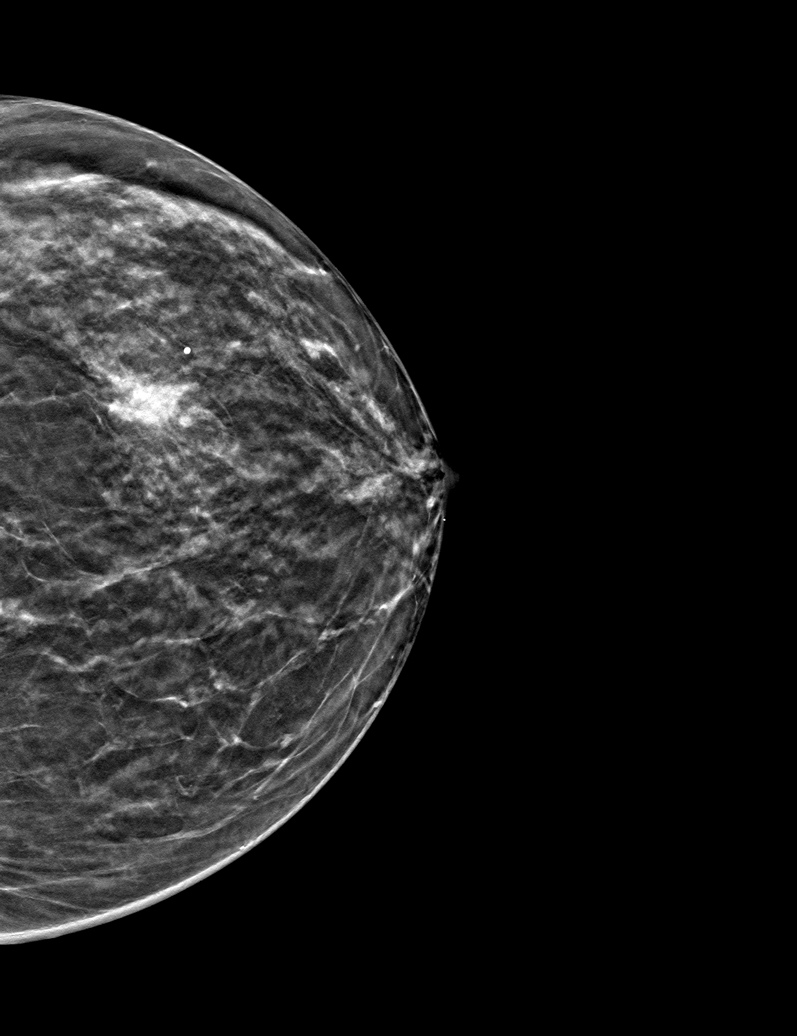

[R CC tomo · tomo slice 25/50.0]
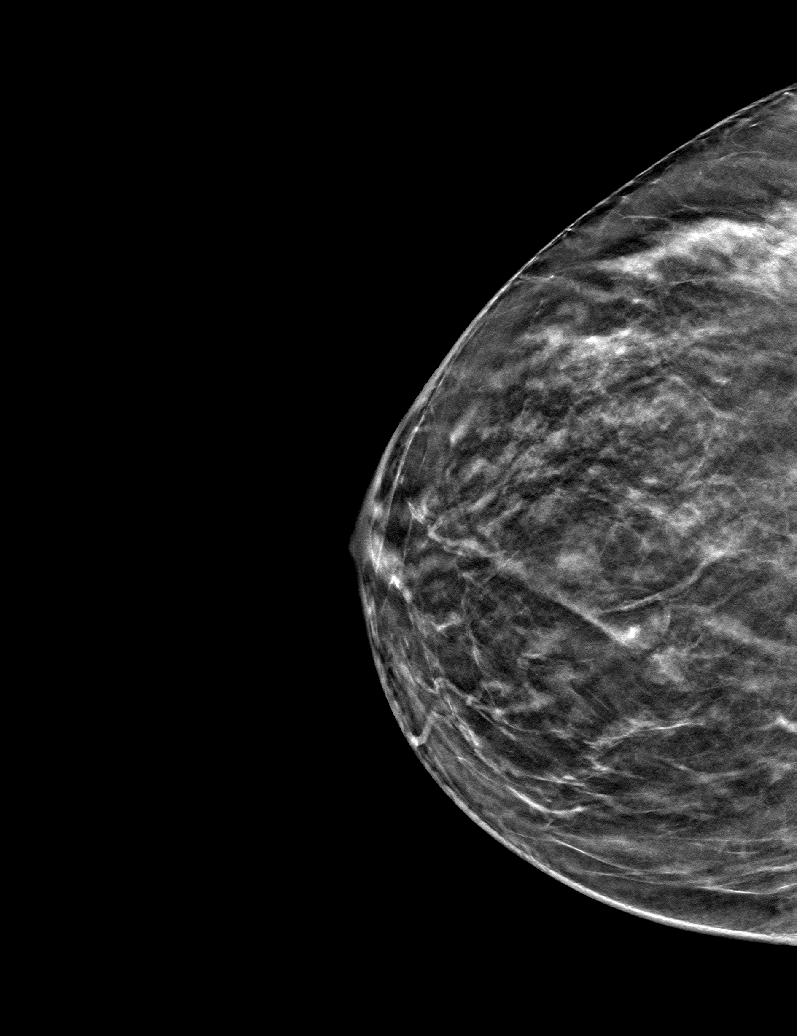

[6 of 18 positions shown; findings below may reference images not displayed]

ACR Breast Density Category b: There are scattered areas of
fibroglandular density.
FINDINGS: In the left breast, a possible mass warrants further evaluation. In
the right breast, no findings suspicious for malignancy.

Images were processed with CAD.
IMPRESSION: Further evaluation is suggested for possible mass in the left
breast.

RECOMMENDATION:
Diagnostic mammogram and possibly ultrasound of the left breast.
(Code:JC-2-SSL)

The patient will be contacted regarding the findings, and additional
imaging will be scheduled.

BI-RADS CATEGORY  0: Incomplete. Need additional imaging evaluation
and/or prior mammograms for comparison.

## 2020-05-02 ENCOUNTER — Other Ambulatory Visit: Payer: Self-pay

## 2020-05-02 ENCOUNTER — Ambulatory Visit (INDEPENDENT_AMBULATORY_CARE_PROVIDER_SITE_OTHER): Payer: Medicare PPO | Admitting: Adult Health

## 2020-05-02 ENCOUNTER — Encounter: Payer: Self-pay | Admitting: Adult Health

## 2020-05-02 VITALS — BP 160/68 | HR 72 | Temp 97.7°F | Wt 126.0 lb

## 2020-05-02 DIAGNOSIS — K644 Residual hemorrhoidal skin tags: Secondary | ICD-10-CM | POA: Diagnosis not present

## 2020-05-02 DIAGNOSIS — K5730 Diverticulosis of large intestine without perforation or abscess without bleeding: Secondary | ICD-10-CM

## 2020-05-02 NOTE — Progress Notes (Signed)
Assessment and Plan:  Victoria Long was seen today for acute visit.  Diagnoses and all orders for this visit:  External hemorrhoids/ rectal fullness Non-inflamed, non-bleeding external hemorrhoids but otherwise normal exam Discussed lifestyle management for stools/bowels; increase fluids from 1 glass up to 4 glasses gradually, goal is bristol 4 "smooth" If needed after increased water intake, add stool softener - miralax Follow up in office if persistent rectal sensation not improving or with any tenderness, rectal bleeding.  If persistent/worsening will refer to GI  Further disposition pending results of labs. Discussed med's effects and SE's.   Over 15 minutes of exam, counseling, chart review, and critical decision making was performed.   Future Appointments  Date Time Provider [place]  06/14/2020  9:00 AM Liane Comber, NP [facility] None    ------------------------------------------------------------------------------------------------------------------   HPI 87 y.o.female presents for evaluation of abnormal sensation of her rectum.   She reports last night had a firm BM (bristol 3 but large, normal for her), after she stood up had sense of something dropping from her rectum/bottom, denies blood, pain/burning, reports had persistent sense of fullness between her buttocks, unsure if rectal area.    Last colonoscopy 05/28/2002 showed no polyps, mild/mod sigmoid colon diverticulosis, grade 1 external hemorrhoid.   She admits stools tend to be very firm, ranges bristol 2-3; takes fiber supplement and senokot PRN, hasn't tried stool softener. She admits very poor fluid intake, 1 small juice glass of water/day only.   Past Medical History:  Diagnosis Date  . Arthritis    knees  . Breast cancer ([place]) 2019   Left Breast  . Breast cancer ([place]) 2012   Right Breast  . Cancer ([place]) 2012   right breast cancer  . Hyperlipidemia   . Melanoma ([place]) 2014   right humerus  . Personal  history of radiation therapy 2012   Right Breast Cancer  . Personal history of radiation therapy 2019   Left Breast Cancer  . Prediabetes   . Vitamin D deficiency      Allergies  Allergen Reactions  . Anastrozole   . Tramadol     Medication caused dizziness    Current Outpatient Medications on File Prior to Visit  Medication Sig  . acetaminophen (TYLENOL) 325 MG tablet Take 2 tablets (650 mg total) by mouth every 6 (six) hours as needed.  . calcium carbonate (OS-CAL) 600 MG TABS Take 600 mg by mouth 2 (two) times daily with a meal.   . Cholecalciferol (VITAMIN D) 1000 UNITS capsule Take 1,000 Units by mouth 2 (two) times daily.   Marland Kitchen LYSINE HCL PO Take 500 mg by mouth daily.   . Multiple Vitamins-Minerals (MULTIVITAMIN PO) Take by mouth daily.  . Omega-3 Fatty Acids (FISH OIL PO) Take by mouth daily. Mega Red   No current facility-administered medications on file prior to visit.    ROS: all negative except above.   Physical Exam:  BP (!) 160/68   Pulse 72   Temp 97.7 F (36.5 C)   Wt 126 lb (57.2 kg)   SpO2 97%   BMI 21.63 kg/m   General Appearance: Well nourished, well dressed elder female in no apparent distress. Eyes: conjunctiva no swelling or erythema ENT/Mouth: Mask in place; Hearing normal.  Neck: Supple, thyroid normal.  Respiratory: Respiratory effort normal Cardio: Appears well perfused Abdomen: Soft, + BS.  Non tender, no guarding, rebound, hernias, masses. Lymphatics: Non tender without lymphadenopathy.  Musculoskeletal: Slow gait with cane Skin: Warm, dry without rashes, lesions,  ecchymosis.  Neuro: Normal muscle tone Psych: Awake and oriented X 3, normal affect, Insight and Judgment appropriate.  Rectal exam: negative without mass, lesions or tenderness, no tenderness noted, Very large non-tender external hemorrhoids to all directions noted, sphincter tone normal. Chaperoned by Chancy Hurter, CMA.     Izora Ribas, NP 10:38 AM [facility]  Adult & Adolescent Internal Medicine

## 2020-05-02 NOTE — Patient Instructions (Signed)
Start by increasing water intake - 1 small glass when you wake up, then 1 glass with each meal = 4 daily (unsweet tea, any clear fluid is ok)  Goal for stools to be smooth, minimal lumps and not strain  If needed can add miralax AFTER you increase your fluid intake    Hemorrhoids Hemorrhoids are swollen veins that may develop:  In the butt (rectum). These are called internal hemorrhoids.  Around the opening of the butt (anus). These are called external hemorrhoids. Hemorrhoids can cause pain, itching, or bleeding. Most of the time, they do not cause serious problems. They usually get better with diet changes, lifestyle changes, and other home treatments. What are the causes? This condition may be caused by:  Having trouble pooping (constipation).  Pushing hard (straining) to poop.  Watery poop (diarrhea).  Pregnancy.  Being very overweight (obese).  Sitting for long periods of time.  Heavy lifting or other activity that causes you to strain.  Anal sex.  Riding a bike for a long period of time. What are the signs or symptoms? Symptoms of this condition include:  Pain.  Itching or soreness in the butt.  Bleeding from the butt.  Leaking poop.  Swelling in the area.  One or more lumps around the opening of your butt. How is this diagnosed? A doctor can often diagnose this condition by looking at the affected area. The doctor may also:  Do an exam that involves feeling the area with a gloved hand (digital rectal exam).  Examine the area inside your butt using a small tube (anoscope).  Order blood tests. This may be done if you have lost a lot of blood.  Have you get a test that involves looking inside the colon using a flexible tube with a camera on the end (sigmoidoscopy or colonoscopy). How is this treated? This condition can usually be treated at home. Your doctor may tell you to change what you eat, make lifestyle changes, or try home treatments. If  these do not help, procedures can be done to remove the hemorrhoids or make them smaller. These may involve:  Placing rubber bands at the base of the hemorrhoids to cut off their blood supply.  Injecting medicine into the hemorrhoids to shrink them.  Shining a type of light energy onto the hemorrhoids to cause them to fall off.  Doing surgery to remove the hemorrhoids or cut off their blood supply. Follow these instructions at home: Eating and drinking  Eat foods that have a lot of fiber in them. These include whole grains, beans, nuts, fruits, and vegetables.  Ask your doctor about taking products that have added fiber (fibersupplements).  Reduce the amount of fat in your diet. You can do this by: ? Eating low-fat dairy products. ? Eating less red meat. ? Avoiding processed foods.  Drink enough fluid to keep your pee (urine) pale yellow.   Managing pain and swelling  Take a warm-water bath (sitz bath) for 20 minutes to ease pain. Do this 3-4 times a day. You may do this in a bathtub or using a portable sitz bath that fits over the toilet.  If told, put ice on the painful area. It may be helpful to use ice between your warm baths. ? Put ice in a plastic bag. ? Place a towel between your skin and the bag. ? Leave the ice on for 20 minutes, 2-3 times a day.   General instructions  Take over-the-counter and prescription  medicines only as told by your doctor. ? Medicated creams and medicines may be used as told.  Exercise often. Ask your doctor how much and what kind of exercise is best for you.  Go to the bathroom when you have the urge to poop. Do not wait.  Avoid pushing too hard when you poop.  Keep your butt dry and clean. Use wet toilet paper or moist towelettes after pooping.  Do not sit on the toilet for a long time.  Keep all follow-up visits as told by your doctor. This is important. Contact a doctor if you:  Have pain and swelling that do not get better with  treatment or medicine.  Have trouble pooping.  Cannot poop.  Have pain or swelling outside the area of the hemorrhoids. Get help right away if you have:  Bleeding that will not stop. Summary  Hemorrhoids are swollen veins in the butt or around the opening of the butt.  They can cause pain, itching, or bleeding.  Eat foods that have a lot of fiber in them. These include whole grains, beans, nuts, fruits, and vegetables.  Take a warm-water bath (sitz bath) for 20 minutes to ease pain. Do this 3-4 times a day. This information is not intended to replace advice given to you by your health care provider. Make sure you discuss any questions you have with your health care provider. Document Revised: 01/15/2018 Document Reviewed: 05/29/2017 Elsevier Patient Education  [place].

## 2020-05-29 ENCOUNTER — Other Ambulatory Visit: Payer: Self-pay | Admitting: Oncology

## 2020-05-29 DIAGNOSIS — Z1231 Encounter for screening mammogram for malignant neoplasm of breast: Secondary | ICD-10-CM

## 2020-06-12 NOTE — Progress Notes (Signed)
CPE  Assessment and Plan:   Encounter for Annual Wellness Visit with abnormal findings Due annually  She will report covid 19 booster information at lab report phone call tomorrow  Atherosclerosis of aorta ([place]) Per CT 07/2016 Control blood pressure, cholesterol, glucose, increase exercise.   Senile purpura (HCC) Age related; reassured patient; protect skin -     CBC with Differential/Platelet  Essential hypertension - fair control, not currently treated with medication - continue to encouraged DASH diet, exercise and monitor at home. Call if greater than 140/80.  -     CBC with Differential/Platelet -     COMPLETE METABOLIC PANEL WITH GFR -     TSH -     Magnesium -     UA/microalbumin -     EKG  Hyperlipidemia, unspecified hyperlipidemia type check lipids, not aggressively perfused secondary to age and benign hx LDL goal <130 by lifestyle decrease fatty foods increase activity.  -     Lipid panel -     TSH  Abnormal glucose Discussed disease progression and risks Discussed diet/exercise, weight management and risk modification -   A1C  Vitamin D deficiency Continue supplement - vitamin D  Malignant neoplasm of upper-outer quadrant of left breast in female, estrogen receptor positive ([place]) Invasive ductal carcinoma of breast, female, left [facility]) Oncology follows; upcoming mammogram is scheduled  Melanoma in situ of upper arm, right [facility]) S/p resection, Continue follow up derm  Encounter for long-term (current) use of medications -     Magnesium  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   Unilateral primary osteoarthritis, right knee Continue follow up ortho, declined surgery, managing fairly   Constipation Try adding senokot daily  - Increase fiber/ water  intake, decrease caffeine, increase activity level, stool softener PRN. Follow up if getting worse or any pain/blood.    Orders Placed This Encounter  Procedures  . CBC with Differential/Platelet  . COMPLETE METABOLIC PANEL WITH GFR  . Magnesium  . Lipid panel  . TSH  . Hemoglobin A1c  . VITAMIN D 25 Hydroxy (Vit-D Deficiency, Fractures)  . Microalbumin / creatinine urine ratio  . Urinalysis, Routine w reflex microscopic  . EKG 12-Lead     Discussed med's effects and SE's. Screening labs and tests as requested with regular follow-up as recommended.  Future Appointments  Date Time Provider [place]  07/21/2020 12:20 PM [facility] 3 [facility]  06/14/2021  9:00 AM Liane Comber, NP [facility] None      Plan:   During the course of the visit the patient was educated and counseled about appropriate screening and preventive services including:    Pneumococcal vaccine   Prevnar 13  Influenza vaccine  Td vaccine  Screening electrocardiogram  Bone densitometry screening  Colorectal cancer screening  Diabetes screening  Glaucoma screening  Nutrition counseling   Advanced directives: requested    HPI  87 y.o. female  presents for CPE. She has Hyperlipidemia; Abnormal glucose; Vitamin D deficiency; Encounter for long-term (current) use of medications; History of right breast cancer; Pancreatic mass; Essential hypertension; Invasive ductal carcinoma of breast, female, left ([place]); Malignant neoplasm of upper-outer quadrant of left breast in female, estrogen receptor positive ([place]); Melanoma in situ of upper arm, right ([place]); At high risk for falls; Atherosclerosis of aorta ([place]); Unilateral  primary osteoarthritis, right knee; Senile purpura ([place]); Sigmoid diverticulosis; and External hemorrhoids on their problem list.  She is widowed, 4 sons, 10 grandchildren, 8-9 grandkids, unsure.   She is still driving, she has some decreased hearing, she does her  medications and does her bills. She lives alone. She walks with a cane due to knee pain, uses tylenol and voltern gel. Family checks on her regularly.   She has graduated from [facility] for history of right breast cancer. 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  Stage IA., was briefly on anastrozole but couldn't tolerating, off now and still following with Dr. Griffith Citron. Last MGM 06/2019, has scheduled 07/2020.   Hx of melanoma of R arm s/p excision in 2014, follows with Dr. Marguerite Olea annually.   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 Patient reports did shared decision making with GI/Dr. Melford Aase and opted not to pursue.   She has right knee pain, walking with cane, on takes 3 x 500 mg tabs most days. Follows with Dr. [name] PRN, not recently.   She reports some slow BM movements, takes metamucil daily, eating prunes, has senokot but hasn't tried this regularly.   BMI is Body mass index is 22.21 kg/m., she has been working on diet and exercise, she does video program for stretching daily. She has increased water intake up from 1 to 4 juice glasses daily. She has 2 cups of coffee daily, no soda. Has switched to almond milk, uses whole grain bread.  Wt Readings from Last 3 Encounters:  06/14/20 124 lb 6.4 oz (56.4 kg)  05/02/20 126 lb (57.2 kg)  01/13/20 128 lb (58.1 kg)   She is not recently on any BP medications. today their BP is BP: 138/70.  She does workout. She denies chest pain, shortness of breath, dizziness.   She has aortic atherosclerosis per CT 07/2016.   She is not on cholesterol medication secondary to age and benign personal hx. Her cholesterol is not  at goal. The cholesterol last visit was:  Lab Results  Component Value Date   CHOL 225 (H) 06/14/2019   HDL 67 06/14/2019   LDLCALC 135 (H) 06/14/2019   TRIG 119 06/14/2019   CHOLHDL 3.4 06/14/2019  . She has been working on diet and exercise for glucose management, she is not on bASA, she is not on ACE/ARB and denies foot ulcerations, hyperglycemia, hypoglycemia , increased appetite, nausea, paresthesia of the feet, polydipsia, polyuria, visual disturbances, vomiting and weight loss. Last A1C in the office was:  Lab Results  Component Value Date   HGBA1C 5.5 06/14/2019    Last GFR:  Lab Results  Component Value Date   GFRNONAA 79 01/13/2020   Patient is on Vitamin D supplement.   Lab Results  Component Value Date   VD25OH 70 06/14/2019       Current Medications:      Current Outpatient Medications (Analgesics):  .  acetaminophen (TYLENOL) 325 MG tablet, Take 2 tablets (650 mg total) by mouth every 6 (six) hours as needed.   Current Outpatient Medications (Other):  .  calcium carbonate (OS-CAL) 600 MG TABS, Take 600 mg by mouth 2 (two) times daily with a meal.  .  Cholecalciferol (VITAMIN D) 1000 UNITS capsule, Take 1,000 Units by mouth 2 (two) times daily.  Marland Kitchen  LYSINE HCL PO, Take 500 mg by mouth daily.  .  Multiple Vitamins-Minerals (MULTIVITAMIN PO), Take by mouth daily. .  Omega-3 Fatty Acids (FISH OIL PO), Take by mouth daily. Mega Red  Health Maintenance:   Immunization History  Administered Date(s) Administered  . Influenza, High Dose Seasonal PF 11/08/2013, 10/04/2014, 11/13/2015, 10/31/2016, 11/12/2017, 10/06/2018  . Influenza-Unspecified 11/10/2012, 10/04/2014  . PFIZER(Purple Top)SARS-COV-2 Vaccination 02/05/2019, 02/26/2019  . Pneumococcal Conjugate-13 07/27/2013  . Pneumococcal-Unspecified 11/30/2010  . Td 02/12/2009  . Zoster 02/12/2006  . Zoster Recombinat (Shingrix) 09/17/2018, 11/17/2018    Tetanus: 2011 - get PRN, cost/age Pneumovax:  2012 Prevnar:  2015 Flu vaccine: 2021 - at pharmacy  Shingrix- 2/2, 2020 [place] 2/2, unsure if has had booster - send information through mychart  PAP 2014 declines further due to age MGM: 06/2019, has scheduled in July 2022 DEXA: 2011 normal, refuses further  Colonoscopy: 2004 declines further due to age  Last Eye Exam:  Dr. Ellie Lunch, Dec 2021 Last dental: New bridge placed, last 2022 Last derm: Follows annually, last 2021   Patient Care Team: Unk Pinto, MD as PCP - General (Internal Medicine) [name], MD as Consulting Physician (Gastroenterology) [name], MD as Referring Physician (Orthopedic Surgery) [name], MD as Consulting Physician (Dermatology) Magrinat, [name], MD as Consulting Physician (Oncology) Griselda Miner, MD as Consulting Physician (Dermatology) [name], MD as Consulting Physician (General Surgery)  Medical History:  Past Medical History:  Diagnosis Date  . Arthritis    knees  . Breast cancer ([place]) 2019   Left Breast  . Breast cancer ([place]) 2012   Right Breast  . Cancer ([place]) 2012   right breast cancer  . Hyperlipidemia   . Melanoma ([employer]) 2014   right humerus  . Personal history of radiation therapy 2012   Right Breast Cancer  . Personal history of radiation therapy 2019   Left Breast Cancer  . Prediabetes   . Vitamin D deficiency    Allergies Allergies  Allergen Reactions  . Anastrozole   . Tramadol     Medication caused dizziness    SURGICAL HISTORY She  has a past surgical history that includes Cataract extraction (Bilateral, 2009); Cesarean section; Melanoma excision (Right, 2014); Breast biopsy (Right, 05/28/2010); Mastectomy, partial (Left, 07/29/2017); Breast lumpectomy (Right, 06/29/2010); and Breast lumpectomy (Left, 08/08/2017). FAMILY HISTORY Her family history includes Arthritis in her mother; Cancer in her sister; Diabetes in her maternal grandmother; Heart attack in her brother; Hypertension  in her sister; Rheum arthritis in her mother. SOCIAL HISTORY She  reports that she quit smoking about 43 years ago. She quit after 10.00 years of use. She has never used smokeless tobacco. She reports current alcohol use. She reports that she does not use drugs.    Review of Systems: Review of Systems  Constitutional: Negative for chills, fever, malaise/fatigue and weight loss.  HENT: Negative for congestion, ear pain, hearing loss, sore throat and tinnitus.   Eyes: Negative.  Negative for blurred vision and double vision.  Respiratory: Negative for cough, shortness of breath and wheezing.   Cardiovascular: Negative for chest pain, palpitations, orthopnea, claudication and leg swelling.  Gastrointestinal: Positive for constipation (Now every other day). Negative for abdominal pain, blood in stool, diarrhea, heartburn, melena, nausea and vomiting.  Genitourinary: Negative.   Musculoskeletal: Positive for joint pain (R knee, chronic). Negative for back pain, falls, myalgias and neck  pain.  Skin: Negative.  Negative for rash.  Neurological: Negative for dizziness, tingling, sensory change, loss of consciousness, weakness and headaches.  Endo/Heme/Allergies: Negative for polydipsia.  Psychiatric/Behavioral: Negative.  Negative for depression. The patient is not nervous/anxious and does not have insomnia.   All other systems reviewed and are negative.   Physical Exam: Estimated body mass index is 22.21 kg/m as calculated from the following:   Height as of this encounter: 5' 2.75" (1.594 m).   Weight as of this encounter: 124 lb 6.4 oz (56.4 kg). BP 138/70   Pulse (!) 52   Temp (!) 97.3 F (36.3 C)   Ht 5' 2.75" (1.594 m)   Wt 124 lb 6.4 oz (56.4 kg)   SpO2 93%   BMI 22.21 kg/m   General Appearance: Well nourished well developed, elderly female in no apparent distress.  Eyes: PERRLA, EOMs, conjunctiva no swelling or erythema ENT/Mouth: Ear canals bil obstruction with dry wax,  non-tender auricle and tragus.  TMs no visualized.  Oropharynx moist and clear with no exudate, erythema, or swelling.   Neck: Supple, thyroid normal. No bruits.  No cervical adenopathy  Respiratory: Respiratory effort normal, Breath sounds clear A&P without wheeze, rhonchi, rales.   Cardio: RRR without murmurs, rubs or gallops. Brisk peripheral pulses without edema.  Chest: symmetric, with normal excursions Breasts: defer Abdomen: Soft, nontender, no guarding, rebound, hernias, masses, or organomegaly.  Lymphatics: Non tender without lymphadenopathy.  Genitourinary: defer Musculoskeletal: Full ROM all peripheral extremities, no effusion, 5/5 strength, and antalgic gait with 4 point cane Skin: Warm, dry without rashes, ecchymosis. Scattered brown nevi with variation - no immediately concerning lesions, derm follows for total body check annually Neuro: Awake and oriented X 3, Cranial nerves intact, reflexes equal bilaterally. Normal muscle tone, no cerebellar symptoms. Sensation intact.  Psych:  normal affect, Insight and Judgment appropriate.   EKG: mild sinus brady, IRBBB, NSCPT  Gorden Harms Masin Shatto 9:51 AM [facility] Adolescent Internal [facility]

## 2020-06-14 ENCOUNTER — Encounter: Payer: Self-pay | Admitting: Adult Health

## 2020-06-14 ENCOUNTER — Other Ambulatory Visit: Payer: Self-pay

## 2020-06-14 ENCOUNTER — Ambulatory Visit: Payer: Medicare PPO | Admitting: Adult Health

## 2020-06-14 VITALS — BP 138/70 | HR 52 | Temp 97.3°F | Ht 62.75 in | Wt 124.4 lb

## 2020-06-14 DIAGNOSIS — Z1329 Encounter for screening for other suspected endocrine disorder: Secondary | ICD-10-CM

## 2020-06-14 DIAGNOSIS — C50912 Malignant neoplasm of unspecified site of left female breast: Secondary | ICD-10-CM

## 2020-06-14 DIAGNOSIS — Z136 Encounter for screening for cardiovascular disorders: Secondary | ICD-10-CM | POA: Diagnosis not present

## 2020-06-14 DIAGNOSIS — R7309 Other abnormal glucose: Secondary | ICD-10-CM

## 2020-06-14 DIAGNOSIS — E559 Vitamin D deficiency, unspecified: Secondary | ICD-10-CM | POA: Diagnosis not present

## 2020-06-14 DIAGNOSIS — Z131 Encounter for screening for diabetes mellitus: Secondary | ICD-10-CM | POA: Diagnosis not present

## 2020-06-14 DIAGNOSIS — Z1389 Encounter for screening for other disorder: Secondary | ICD-10-CM

## 2020-06-14 DIAGNOSIS — I700 Atherosclerosis of aorta: Secondary | ICD-10-CM

## 2020-06-14 DIAGNOSIS — Z79899 Other long term (current) drug therapy: Secondary | ICD-10-CM

## 2020-06-14 DIAGNOSIS — Z0001 Encounter for general adult medical examination with abnormal findings: Secondary | ICD-10-CM | POA: Diagnosis not present

## 2020-06-14 DIAGNOSIS — D0361 Melanoma in situ of right upper limb, including shoulder: Secondary | ICD-10-CM

## 2020-06-14 DIAGNOSIS — Z853 Personal history of malignant neoplasm of breast: Secondary | ICD-10-CM

## 2020-06-14 DIAGNOSIS — K5730 Diverticulosis of large intestine without perforation or abscess without bleeding: Secondary | ICD-10-CM

## 2020-06-14 DIAGNOSIS — Z0000 Encounter for general adult medical examination without abnormal findings: Secondary | ICD-10-CM | POA: Diagnosis not present

## 2020-06-14 DIAGNOSIS — K862 Cyst of pancreas: Secondary | ICD-10-CM

## 2020-06-14 DIAGNOSIS — D692 Other nonthrombocytopenic purpura: Secondary | ICD-10-CM

## 2020-06-14 DIAGNOSIS — I10 Essential (primary) hypertension: Secondary | ICD-10-CM

## 2020-06-14 DIAGNOSIS — H6123 Impacted cerumen, bilateral: Secondary | ICD-10-CM

## 2020-06-14 DIAGNOSIS — K644 Residual hemorrhoidal skin tags: Secondary | ICD-10-CM

## 2020-06-14 DIAGNOSIS — E785 Hyperlipidemia, unspecified: Secondary | ICD-10-CM | POA: Diagnosis not present

## 2020-06-14 NOTE — Patient Instructions (Signed)
  Ms. Gootee , Thank you for taking time to come for your Annual Wellness Visit. I appreciate your ongoing commitment to your health goals. Please review the following plan we discussed and let me know if I can assist you in the future.   This is a list of the screening recommended for you and due dates:  Health Maintenance  Topic Date Due  . Tetanus Vaccine  02/13/2019  . COVID-19 Vaccine (3 - Pfizer risk 4-dose series) 03/26/2019  . Flu Shot  08/21/2020  . DEXA scan (bone density measurement)  Completed  . Pneumonia vaccines  Completed  . HPV Vaccine  Aged Out     Try senokot daily for regular bowel movements   Know what a healthy weight is for you (roughly BMI <25) and aim to maintain this  Aim for 7+ servings of fruits and vegetables daily  65-80+ fluid ounces of water or unsweet tea for healthy kidneys  Limit to max 1 drink of alcohol per day; avoid smoking/tobacco  Limit animal fats in diet for cholesterol and heart health - choose grass fed whenever available  Avoid highly processed foods, and foods high in saturated/trans fats  Aim for low stress - take time to unwind and care for your mental health  Aim for 150 min of moderate intensity exercise weekly for heart health, and weights twice weekly for bone health  Aim for 7-9 hours of sleep daily

## 2020-06-15 LAB — COMPLETE METABOLIC PANEL WITH GFR
AG Ratio: 1.9 (calc) (ref 1.0–2.5)
ALT: 15 U/L (ref 6–29)
AST: 19 U/L (ref 10–35)
Albumin: 4.3 g/dL (ref 3.6–5.1)
Alkaline phosphatase (APISO): 50 U/L (ref 37–153)
BUN/Creatinine Ratio: 23 (calc) — ABNORMAL HIGH (ref 6–22)
BUN: 13 mg/dL (ref 7–25)
CO2: 28 mmol/L (ref 20–32)
Calcium: 10 mg/dL (ref 8.6–10.4)
Chloride: 106 mmol/L (ref 98–110)
Creat: 0.56 mg/dL — ABNORMAL LOW (ref 0.60–0.88)
GFR, Est African American: 95 mL/min/{1.73_m2} (ref >=60)
GFR, Est Non African American: 82 mL/min/{1.73_m2} (ref >=60)
Globulin: 2.3 g/dL (calc) (ref 1.9–3.7)
Glucose, Bld: 85 mg/dL (ref 65–99)
Potassium: 4.3 mmol/L (ref 3.5–5.3)
Sodium: 143 mmol/L (ref 135–146)
Total Bilirubin: 0.7 mg/dL (ref 0.2–1.2)
Total Protein: 6.6 g/dL (ref 6.1–8.1)

## 2020-06-15 LAB — LIPID PANEL
Cholesterol: 226 mg/dL — ABNORMAL HIGH (ref <200)
HDL: 71 mg/dL (ref >=50)
LDL Cholesterol (Calc): 133 mg/dL (calc) — ABNORMAL HIGH
Non-HDL Cholesterol (Calc): 155 mg/dL (calc) — ABNORMAL HIGH (ref <130)
Total CHOL/HDL Ratio: 3.2 (calc) (ref <5.0)
Triglycerides: 110 mg/dL (ref <150)

## 2020-06-15 LAB — MAGNESIUM: Magnesium: 2.2 mg/dL (ref 1.5–2.5)

## 2020-06-15 LAB — CBC WITH DIFFERENTIAL/PLATELET
Absolute Monocytes: 529 cells/uL (ref 200–950)
Basophils Absolute: 32 cells/uL (ref 0–200)
Basophils Relative: 0.6 %
Eosinophils Absolute: 70 cells/uL (ref 15–500)
Eosinophils Relative: 1.3 %
HCT: 40 % (ref 35.0–45.0)
Hemoglobin: 13 g/dL (ref 11.7–15.5)
Lymphs Abs: 1237 cells/uL (ref 850–3900)
MCH: 32.3 pg (ref 27.0–33.0)
MCHC: 32.5 g/dL (ref 32.0–36.0)
MCV: 99.5 fL (ref 80.0–100.0)
MPV: 10.8 fL (ref 7.5–12.5)
Monocytes Relative: 9.8 %
Neutro Abs: 3532 cells/uL (ref 1500–7800)
Neutrophils Relative %: 65.4 %
Platelets: 247 10*3/uL (ref 140–400)
RBC: 4.02 10*6/uL (ref 3.80–5.10)
RDW: 11.5 % (ref 11.0–15.0)
Total Lymphocyte: 22.9 %
WBC: 5.4 10*3/uL (ref 3.8–10.8)

## 2020-06-15 LAB — URINALYSIS, ROUTINE W REFLEX MICROSCOPIC
Bilirubin Urine: NEGATIVE
Glucose, UA: NEGATIVE
Hgb urine dipstick: NEGATIVE
Ketones, ur: NEGATIVE
Leukocytes,Ua: NEGATIVE
Nitrite: NEGATIVE
Protein, ur: NEGATIVE
Specific Gravity, Urine: 1.007 (ref 1.001–1.035)
pH: 7 (ref 5.0–8.0)

## 2020-06-15 LAB — HEMOGLOBIN A1C
Hgb A1c MFr Bld: 5.6 % of total Hgb (ref <5.7)
Mean Plasma Glucose: 114 mg/dL
eAG (mmol/L): 6.3 mmol/L

## 2020-06-15 LAB — MICROALBUMIN / CREATININE URINE RATIO
Creatinine, Urine: 26 mg/dL (ref 20–275)
Microalb Creat Ratio: 19 mcg/mg creat (ref <30)
Microalb, Ur: 0.5 mg/dL

## 2020-06-15 LAB — TSH: TSH: 1.54 mIU/L (ref 0.40–4.50)

## 2020-06-15 LAB — VITAMIN D 25 HYDROXY (VIT D DEFICIENCY, FRACTURES): Vit D, 25-Hydroxy: 76 ng/mL (ref 30–100)

## 2020-07-06 ENCOUNTER — Ambulatory Visit: Payer: Medicare PPO | Admitting: Adult Health

## 2020-07-21 ENCOUNTER — Ambulatory Visit: Payer: Medicare PPO | Admitting: Adult Health

## 2020-07-21 ENCOUNTER — Ambulatory Visit: Payer: Medicare PPO

## 2020-07-21 ENCOUNTER — Other Ambulatory Visit: Payer: Self-pay

## 2020-07-21 ENCOUNTER — Encounter: Payer: Self-pay | Admitting: Adult Health

## 2020-07-21 VITALS — BP 112/58 | HR 78 | Temp 97.5°F | Wt 122.0 lb

## 2020-07-21 DIAGNOSIS — K5900 Constipation, unspecified: Secondary | ICD-10-CM

## 2020-07-21 DIAGNOSIS — K644 Residual hemorrhoidal skin tags: Secondary | ICD-10-CM

## 2020-07-21 MED ORDER — HYDROCORTISONE (PERIANAL) 2.5 % EX CREA: 1.0000 "application " | TOPICAL_CREAM | Freq: Two times a day (BID) | CUTANEOUS | 0 refills | Status: DC

## 2020-07-21 NOTE — Patient Instructions (Addendum)
Try adding miralax - want very soft stools  Sit on donut pillow  Try sitz baths Preparation H  Use topical steroid cream sparingly - up to 1 week then stop   Pick up hydrocortisone 1 % cream if the anusol is expensive, can do twice a day   Hemorrhoids Hemorrhoids are swollen veins in and around the rectum or anus. There are two types of hemorrhoids: Internal hemorrhoids. These occur in the veins that are just inside the rectum. They may poke through to the outside and become irritated and painful. External hemorrhoids. These occur in the veins that are outside the anus and can be felt as a painful swelling or hard lump near the anus. Most hemorrhoids do not cause serious problems, and they can be managed with home treatments such as diet and lifestyle changes. If home treatments do nothelp the symptoms, procedures can be done to shrink or remove the hemorrhoids. What are the causes? This condition is caused by increased pressure in the anal area. This pressure may result from various things, including: Constipation. Straining to have a bowel movement. Diarrhea. Pregnancy. Obesity. Sitting for long periods of time. Heavy lifting or other activity that causes you to strain. Anal sex. Riding a bike for a long period of time. What are the signs or symptoms? Symptoms of this condition include: Pain. Anal itching or irritation. Rectal bleeding. Leakage of stool (feces). Anal swelling. One or more lumps around the anus. How is this diagnosed? This condition can often be diagnosed through a visual exam. Other exams or tests may also be done, such as: An exam that involves feeling the rectal area with a gloved hand (digital rectal exam). An exam of the anal canal that is done using a small tube (anoscope). A blood test, if you have lost a significant amount of blood. A test to look inside the colon using a flexible tube with a camera on the end (sigmoidoscopy or colonoscopy). How is  this treated? This condition can usually be treated at home. However, various procedures may be done if dietary changes, lifestyle changes, and other home treatments do not help your symptoms. These procedures can help make the hemorrhoids smaller or remove them completely. Some of these procedures involve surgery, and others do not. Common procedures include: Rubber band ligation. Rubber bands are placed at the base of the hemorrhoids to cut off their blood supply. Sclerotherapy. Medicine is injected into the hemorrhoids to shrink them. Infrared coagulation. A type of light energy is used to get rid of the hemorrhoids. Hemorrhoidectomy surgery. The hemorrhoids are surgically removed, and the veins that supply them are tied off. Stapled hemorrhoidopexy surgery. The surgeon staples the base of the hemorrhoid to the rectal wall. Follow these instructions at home: Eating and drinking  Eat foods that have a lot of fiber in them, such as whole grains, beans, nuts, fruits, and vegetables. Ask your health care provider about taking products that have added fiber (fiber supplements). Reduce the amount of fat in your diet. You can do this by eating low-fat dairy products, eating less red meat, and avoiding processed foods. Drink enough fluid to keep your urine pale yellow.  Managing pain and swelling  Take warm sitz baths for 20 minutes, 3-4 times a day to ease pain and discomfort. You may do this in a bathtub or using a portable sitz bath that fits over the toilet. If directed, apply ice to the affected area. Using ice packs between sitz baths may be  helpful. Put ice in a plastic bag. Place a towel between your skin and the bag. Leave the ice on for 20 minutes, 2-3 times a day.  General instructions Take over-the-counter and prescription medicines only as told by your health care provider. Use medicated creams or suppositories as told. Get regular exercise. Ask your health care provider how much  and what kind of exercise is best for you. In general, you should do moderate exercise for at least 30 minutes on most days of the week (150 minutes each week). This can include activities such as walking, biking, or yoga. Go to the bathroom when you have the urge to have a bowel movement. Do not wait. Avoid straining to have bowel movements. Keep the anal area dry and clean. Use wet toilet paper or moist towelettes after a bowel movement. Do not sit on the toilet for long periods of time. This increases blood pooling and pain. Keep all follow-up visits as told by your health care provider. This is important. Contact a health care provider if you have: Increasing pain and swelling that are not controlled by treatment or medicine. Difficulty having a bowel movement, or you are unable to have a bowel movement. Pain or inflammation outside the area of the hemorrhoids. Get help right away if you have: Uncontrolled bleeding from your rectum. Summary Hemorrhoids are swollen veins in and around the rectum or anus. Most hemorrhoids can be managed with home treatments such as diet and lifestyle changes. Taking warm sitz baths can help ease pain and discomfort. In severe cases, procedures or surgery can be done to shrink or remove the hemorrhoids. This information is not intended to replace advice given to you by your health care provider. Make sure you discuss any questions you have with your healthcare provider. Document Revised: 06/05/2018 Document Reviewed: 05/29/2017 Elsevier Patient Education  [place].

## 2020-07-21 NOTE — Progress Notes (Signed)
Assessment and Plan:  Victoria Long was seen today for hemorrhoids.  Diagnoses and all orders for this visit:  Constipation, unspecified constipation type Discussed goal to soften stools rather than stimulate or bulk for hemorrhoids, suggested she add miralax until soft, continue for maintenance Continue fiber in diet, water intake, increase activity level Follow up if not improving  External hemorrhoids Doesn't appear inflamed or bleeding; very large external hemorroids; suggested sit on donut pillow, soften stools per above to avoid irritation, sitz baths, wipe with gentle/moist wipes to avoid unnecessary friction Prep H PRN, can do limited anusol for any tenderness (1-2 weeks max) -     hydrocortisone (ANUSOL-HC) 2.5 % rectal cream; Place 1 application rectally 2 (two) times daily.  Further disposition pending results of labs. Discussed med's effects and SE's.   Over 20 minutes of exam, counseling, chart review, and critical decision making was performed.   Future Appointments  Date Time Provider [place]  12/18/2020 11:30 AM Victoria Comber, NP [facility] None  06/14/2021  9:00 AM Victoria Comber, NP [facility] None    ------------------------------------------------------------------------------------------------------------------   HPI BP (!) 112/58   Pulse 78   Temp (!) 97.5 F (36.4 C)   Wt 122 lb (55.3 kg)   SpO2 98%   BMI 21.78 kg/m  87 y.o.female presents for evaluation of tender hemorrhoid and constipation. Ongoing issue.   She reports yesterday sat more than usual due to knee pain, now having sensation of fullness at rectum. She denies bleeding or tenderness. Has prep H but hasn't used recently.   She does endorse ongoing constipation, has been using senokot, phsyllium and glycerine suppository. She has not been using miralax. She has increased fiber in diet, water intake.   Past Medical History:  Diagnosis Date   Arthritis    knees   Breast cancer ([place])  2019   Left Breast   Breast cancer ([place]) 2012   Right Breast   Cancer [facility]) 2012   right breast cancer   Hyperlipidemia    Melanoma ([place]) 2014   right humerus   Personal history of radiation therapy 2012   Right Breast Cancer   Personal history of radiation therapy 2019   Left Breast Cancer   Prediabetes    Vitamin D deficiency      Allergies  Allergen Reactions   Anastrozole    Tramadol     Medication caused dizziness    Current Outpatient Medications on File Prior to Visit  Medication Sig   acetaminophen (TYLENOL) 325 MG tablet Take 2 tablets (650 mg total) by mouth every 6 (six) hours as needed.   calcium carbonate (OS-CAL) 600 MG TABS Take 600 mg by mouth 2 (two) times daily with a meal.    Cholecalciferol (VITAMIN D) 1000 UNITS capsule Take 1,000 Units by mouth 2 (two) times daily.    LYSINE HCL PO Take 500 mg by mouth daily.    Multiple Vitamins-Minerals (MULTIVITAMIN PO) Take by mouth daily.   Omega-3 Fatty Acids (FISH OIL PO) Take by mouth daily. Mega Red   No current facility-administered medications on file prior to visit.    ROS: all negative except above.   Physical Exam:  BP (!) 112/58   Pulse 78   Temp (!) 97.5 F (36.4 C)   Wt 122 lb (55.3 kg)   SpO2 98%   BMI 21.78 kg/m   General Appearance: Well nourished well dressed elder female, in no apparent distress. Eyes:  conjunctiva no swelling or erythema ENT/Mouth: Mask in place;  Hearing normal.  Neck: Supple Respiratory: Respiratory effort normal Cardio: Appears well perfused. Brisk peripheral pulses without edema.  Abdomen: Soft, + BS.  Non tender, no guarding, masses.  Lymphatics: Non tender without lymphadenopathy.  Musculoskeletal: Slow steady gait with cane, mild kyphosis Skin: Warm, dry without rashes, lesions, ecchymosis.  Neuro: Normal muscle tone Psych: Awake and oriented X 3, normal affect, Insight and Judgment appropriate.  Rectal exam: negative without mass, lesions or tenderness,  no tenderness noted, external hemorrhoids noted, sphincter tone normal, very large fleshy non-tender/non-bleeding hemorroids and skin tags.   Victoria Ribas, NP 9:44 AM [facility]

## 2020-08-16 ENCOUNTER — Ambulatory Visit
Admission: RE | Admit: 2020-08-16 | Discharge: 2020-08-16 | Disposition: A | Discharge status: Home / Self Care | Payer: Medicare PPO | Source: Ambulatory Visit | Attending: Oncology | Admitting: Oncology

## 2020-08-16 ENCOUNTER — Other Ambulatory Visit: Payer: Self-pay

## 2020-08-16 DIAGNOSIS — Z1231 Encounter for screening mammogram for malignant neoplasm of breast: Secondary | ICD-10-CM | POA: Diagnosis not present

## 2020-09-14 DIAGNOSIS — L814 Other melanin hyperpigmentation: Secondary | ICD-10-CM | POA: Diagnosis not present

## 2020-09-14 DIAGNOSIS — L570 Actinic keratosis: Secondary | ICD-10-CM | POA: Diagnosis not present

## 2020-09-14 DIAGNOSIS — D2239 Melanocytic nevi of other parts of face: Secondary | ICD-10-CM | POA: Diagnosis not present

## 2020-09-14 DIAGNOSIS — L820 Inflamed seborrheic keratosis: Secondary | ICD-10-CM | POA: Diagnosis not present

## 2020-09-14 DIAGNOSIS — L821 Other seborrheic keratosis: Secondary | ICD-10-CM | POA: Diagnosis not present

## 2020-09-14 DIAGNOSIS — D692 Other nonthrombocytopenic purpura: Secondary | ICD-10-CM | POA: Diagnosis not present

## 2020-09-14 DIAGNOSIS — Z85820 Personal history of malignant melanoma of skin: Secondary | ICD-10-CM | POA: Diagnosis not present

## 2020-09-14 DIAGNOSIS — Z85828 Personal history of other malignant neoplasm of skin: Secondary | ICD-10-CM | POA: Diagnosis not present

## 2020-09-14 DIAGNOSIS — D1801 Hemangioma of skin and subcutaneous tissue: Secondary | ICD-10-CM | POA: Diagnosis not present

## 2020-10-24 DIAGNOSIS — K5900 Constipation, unspecified: Secondary | ICD-10-CM | POA: Diagnosis not present

## 2020-10-24 DIAGNOSIS — K649 Unspecified hemorrhoids: Secondary | ICD-10-CM | POA: Diagnosis not present

## 2020-10-24 DIAGNOSIS — M1990 Unspecified osteoarthritis, unspecified site: Secondary | ICD-10-CM | POA: Diagnosis not present

## 2020-10-24 DIAGNOSIS — Z79811 Long term (current) use of aromatase inhibitors: Secondary | ICD-10-CM | POA: Diagnosis not present

## 2020-10-24 DIAGNOSIS — I700 Atherosclerosis of aorta: Secondary | ICD-10-CM | POA: Diagnosis not present

## 2020-10-24 DIAGNOSIS — R030 Elevated blood-pressure reading, without diagnosis of hypertension: Secondary | ICD-10-CM | POA: Diagnosis not present

## 2020-10-24 DIAGNOSIS — E785 Hyperlipidemia, unspecified: Secondary | ICD-10-CM | POA: Diagnosis not present

## 2020-10-24 DIAGNOSIS — Z853 Personal history of malignant neoplasm of breast: Secondary | ICD-10-CM | POA: Diagnosis not present

## 2020-10-24 DIAGNOSIS — R32 Unspecified urinary incontinence: Secondary | ICD-10-CM | POA: Diagnosis not present

## 2020-12-12 NOTE — Progress Notes (Signed)
MEDICARE ANNUAL WELLNESS VISIT AND FOLLOW UP  Assessment and Plan: Encounter for Medicare Annual Wellness Visit 1 year If she cuts her self on something rusty or dirty she will get it, otherwise maybe next visit Will try to start doing every 6 months  Senile purpura ([place]) -     CBC with Differential/Platelet  Malignant neoplasm of upper-outer quadrant of left breast in female, estrogen receptor positive ([place]) Monitor  Atherosclerosis of aorta (H[facility]) Control blood pressure, cholesterol, glucose, increase exercise.   Melanoma in situ of upper arm, right ([place]) Continue follow up derm  Invasive ductal carcinoma of breast, female, left ([place]) Monitor  Essential hypertension -     CBC with Differential/Platelet -     COMPLETE METABOLIC PANEL WITH GFR -     TSH - continue medications, DASH diet, exercise and monitor at home. Call if greater than 130/80.   Hyperlipidemia, unspecified hyperlipidemia type -     Lipid panel check lipids decrease fatty foods increase activity.   Abnormal glucose Discussed disease progression and risks Discussed diet/exercise, weight management and risk modification  Vitamin D deficiency Continue supplement  Encounter for long-term (current) use of medications -     Magnesium  Pancreatic mass Monitor  At high risk for falls Continue cane, continue walking, can send PT as needed.   Unilateral primary osteoarthritis, right knee Continue follow up ortho   Discussed med's effects and SE's. Screening labs and tests as requested with regular follow-up as recommended.  Future Appointments  Date Time Provider [place]  06/14/2021  9:00 AM Liane Comber, NP [facility] None  12/19/2021 11:00 AM Magda Bernheim, NP [facility] None    Plan:   During the course of the visit the patient was educated and counseled about appropriate screening and preventive services including:   Pneumococcal vaccine  Prevnar 13 Influenza vaccine Td  vaccine Screening electrocardiogram Bone densitometry screening Colorectal cancer screening Diabetes screening Glaucoma screening Nutrition counseling  Advanced directives: requested   HPI  87 y.o. female  presents for a medicare and 3 month follow up.   She is still driving, she has some decreased hearing, she does her medications and does her bills. She lives alone. She walks with a cane due to knee pain, uses tylenol and voltern gel.   She has remote history of smoking, uncontrolled chol, remote history of melanoma and breast cancer.    She is not on any medications. today their BP is BP: 138/60.  She does not workout due to knee pain. She denies chest pain, shortness of breath, dizziness.   BMI is Body mass index is 21.53 kg/m., she is working on diet and exercise. Wt Readings from Last 3 Encounters:  12/18/20 120 lb 9.6 oz (54.7 kg)  07/21/20 122 lb (55.3 kg)  06/14/20 124 lb 6.4 oz (56.4 kg)   She has graduated from [facility] center for history of right breast cancer. 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  Stage IA., was on anastrozole, off now and still following with Dr. Griffith Citron. Last [facility] 06/2019  She has right knee pain, walking with cane, on 1-3 tylenol 500mg , takes with food. Follows with Dr. [name].   She is not on cholesterol medicationHer cholesterol is not at goal. She is trying to watch saturated fats and is exercising daily. The cholesterol last visit was:  Lab Results  Component  Value Date   CHOL 226 (H) 06/14/2020   HDL 71 06/14/2020   LDLCALC 133 (H) 06/14/2020   TRIG 110 06/14/2020   CHOLHDL 3.2 06/14/2020  . She has been working on diet and exercise for prediabetes, she is on bASA, she is not on ACE/ARB and denies foot ulcerations, hyperglycemia, hypoglycemia , increased appetite, nausea, paresthesia of the  feet, polydipsia, polyuria, visual disturbances, vomiting and weight loss. Last A1C in the office was:  Lab Results  Component Value Date   HGBA1C 5.6 06/14/2020   Patient is on Vitamin D supplement.   Lab Results  Component Value Date   VD25OH 68 06/14/2020     She is seeing her dermatologist every 6 months for skin checks due to history of melanoma.    Current Medications:      Current Outpatient Medications (Analgesics):    acetaminophen (TYLENOL) 325 MG tablet, Take 2 tablets (650 mg total) by mouth every 6 (six) hours as needed.   Current Outpatient Medications (Other):    calcium carbonate (OS-CAL) 600 MG TABS, Take 600 mg by mouth 2 (two) times daily with a meal.    Cholecalciferol (VITAMIN D) 1000 UNITS capsule, Take 1,000 Units by mouth 2 (two) times daily.    hydrocortisone (ANUSOL-HC) 2.5 % rectal cream, Place 1 application rectally 2 (two) times daily.   LYSINE HCL PO, Take 500 mg by mouth daily.    Multiple Vitamins-Minerals (MULTIVITAMIN PO), Take by mouth daily.   Omega-3 Fatty Acids (FISH OIL PO), Take by mouth daily. Mega Red (Patient not taking: Reported on 12/18/2020)  Health Maintenance:   Immunization History  Administered Date(s) Administered   Influenza, High Dose Seasonal PF 11/08/2013, 10/04/2014, 11/13/2015, 10/31/2016, 11/12/2017, 10/06/2018   Influenza-Unspecified 11/10/2012, 10/04/2014, 10/13/2020   PFIZER(Purple Top)SARS-COV-2 Vaccination 02/05/2019, 02/26/2019, 12/23/2019   Pneumococcal Conjugate-13 07/27/2013   Pneumococcal-Unspecified 11/30/2010   Td 02/12/2009   Zoster Recombinat (Shingrix) 09/17/2018, 11/17/2018   Zoster, Live 02/12/2006   Health Maintenance  Topic Date Due   COVID-19 Vaccine (4 - Booster for Pfizer series) 01/03/2021 (Originally 02/17/2020)   Pneumonia Vaccine 48+ Years old (2 - PPSV23 if available, else PCV20) 12/18/2021 (Originally 07/28/2014)   TETANUS/TDAP  12/18/2021 (Originally 02/13/2019)   INFLUENZA VACCINE   Completed   DEXA SCAN  Completed   Zoster Vaccines- Shingrix  Completed   HPV VACCINES  Aged Out   Tetanus: 2011 Pneumovax: 2012 Prevnar:  2015 Flu vaccine: 2020 Zostavax:  2008 Pfizer completed- will message dates Shingrix- confirmed  MGM: 06/2019 DEXA: 2011, refuses further Colonoscopy: 2004 declines further due to age Last Eye Exam:  Dr. Ellie Lunch, scheduled Dec 2022 Dentist: Dr. Donn Pierini 2022 PAP 2014 declines further due to age  Patient Care Team: Unk Pinto, MD as PCP - General (Internal Medicine) [name], MD as Consulting Physician (Gastroenterology) [name], MD as Referring Physician (Orthopedic Surgery) [name], MD as Consulting Physician (Dermatology) Magrinat, [name], MD as Consulting Physician (Oncology) Griselda Miner, MD as Consulting Physician (Dermatology) [name], MD as Consulting Physician (General Surgery)  Medical History:  Past Medical History:  Diagnosis Date   Arthritis    knees   Breast cancer [facility]) 2019   Left Breast   Breast cancer [facility]) 2012   Right Breast   Cancer [facility]) 2012   right breast cancer   Hyperlipidemia    Melanoma [facility]) 2014   right humerus   Personal history of radiation therapy 2012   Right Breast Cancer   Personal history of  radiation therapy 2019   Left Breast Cancer   Prediabetes    Vitamin D deficiency    Allergies Allergies  Allergen Reactions   Anastrozole    Tramadol     Medication caused dizziness    SURGICAL HISTORY She  has a past surgical history that includes Cataract extraction (Bilateral, 2009); Cesarean section; Melanoma excision (Right, 2014); Breast biopsy (Right, 05/28/2010); Mastectomy, partial (Left, 07/29/2017); Breast lumpectomy (Right, 06/29/2010); and Breast lumpectomy (Left, 08/08/2017). FAMILY HISTORY Her family history includes Arthritis in her mother; Cancer in her sister; Diabetes in her maternal grandmother; Heart attack in her brother; Hypertension in  her sister; Rheum arthritis in her mother. SOCIAL HISTORY She  reports that she quit smoking about 44 years ago. Her smoking use included cigarettes. She has never used smokeless tobacco. She reports current alcohol use. She reports that she does not use drugs.    MEDICARE WELLNESS OBJECTIVES: Physical activity: Current Exercise Habits: Home exercise routine, Type of exercise: calisthenics, Time (Minutes): 30, Frequency (Times/Week): 7, Weekly Exercise (Minutes/Week): 210, Intensity: Mild, Exercise limited by: orthopedic condition(s) Cardiac risk factors: Cardiac Risk Factors include: advanced age (>49men, >20 women) Depression/mood screen:   Depression screen [facility] 2/9 12/18/2020  Decreased Interest 0  Down, Depressed, Hopeless 0  PHQ - 2 Score 0    ADLs:  In your present state of health, do you have any difficulty performing the following activities: 12/18/2020  Hearing? Y  Vision? N  Difficulty concentrating or making decisions? N  Walking or climbing stairs? N  Dressing or bathing? N  Doing errands, shopping? N  Some recent data might be hidden     Cognitive Testing  Alert? Yes  Normal Appearance?Yes  Oriented to person? Yes  Place? Yes   Time? Yes  Recall of three objects?  Yes  Can perform simple calculations? Yes  Displays appropriate judgment?Yes  Can read the correct time from a watch face?Yes  EOL planning: Does Patient Have a Medical Advance Directive?: Yes Type of Advance Directive: Healthcare Power of Attorney, Living will Does patient want to make changes to medical advance directive?: No - Patient declined Copy of [place] in Chart?: No - copy requested    Review of Systems: Review of Systems  Constitutional:  Negative for chills, fever, malaise/fatigue and weight loss.  HENT:  Negative for congestion, ear pain, hearing loss and sore throat.   Eyes: Negative.  Negative for blurred vision and double vision.  Respiratory:  Negative for  cough, shortness of breath and wheezing.   Cardiovascular:  Negative for chest pain, palpitations, orthopnea and leg swelling.  Gastrointestinal:  Negative for abdominal pain, blood in stool, constipation, diarrhea, heartburn, melena, nausea and vomiting.  Genitourinary: Negative.   Musculoskeletal:  Positive for joint pain (right knee). Negative for back pain, falls, myalgias and neck pain.  Skin: Negative.  Negative for rash.  Neurological:  Negative for dizziness, tingling, tremors, sensory change, loss of consciousness and headaches.  Endo/Heme/Allergies:  Bruises/bleeds easily.  Psychiatric/Behavioral:  Negative for depression, memory loss and suicidal ideas. The patient is not nervous/anxious and does not have insomnia.    Physical Exam: Estimated body mass index is 21.53 kg/m as calculated from the following:   Height as of 06/14/20: 5' 2.75" (1.594 m).   Weight as of this encounter: 120 lb 9.6 oz (54.7 kg). BP 138/60   Pulse 76   Temp (!) 97.5 F (36.4 C)   Wt 120 lb 9.6 oz (54.7 kg)   SpO2  98%   BMI 21.53 kg/m   General Appearance: Well nourished well developed, in no apparent distress.  Eyes: PERRLA, EOMs, left eye with stye external upper lid, conjunctiva no swelling or erythema ENT/Mouth: Ear canals normal without obstruction after removal from left ear, swelling, erythema, or discharge.  TMs normal bilaterally with no erythema, bulging, retraction, or loss of landmark.  Oropharynx moist and clear with no exudate, erythema, or swelling.   Neck: Supple, thyroid normal. No bruits.  No cervical adenopathy Respiratory: Respiratory effort normal, Breath sounds clear A&P without wheeze, rhonchi, rales.   Cardio: RRR without murmurs, rubs or gallops. Brisk peripheral pulses without edema.  Chest: symmetric, with normal excursions Breasts: defer Abdomen: Soft, nontender, no guarding, rebound, hernias, masses, or organomegaly.  Lymphatics: Non tender without lymphadenopathy.   Genitourinary: defer Musculoskeletal: Full ROM all peripheral extremities,5/5 strength, and antalgic gait, negative straight leg. Right knee cracking heard with extension Skin: Warm, dry without rashes, lesions, ecchymosis. Neuro: Awake and oriented X 3, Cranial nerves intact, reflexes equal bilaterally. Normal muscle tone, no cerebellar symptoms. Sensation intact.  Psych:  normal affect, Insight and Judgment appropriate.    Medicare Attestation I have personally reviewed: The patient's medical and social history Their use of alcohol, tobacco or illicit drugs Their current medications and supplements The patient's functional ability including ADLs,fall risks, home safety risks, cognitive, and hearing and visual impairment Diet and physical activities Evidence for depression or mood disorders  The patient's weight, height, BMI, and visual acuity have been recorded in the chart.  I have made referrals, counseling, and provided education to the patient based on review of the above and I have provided the patient with a written personalized care plan for preventive services.  Leeah Politano W Vickey Boak 12:01 PM [facility]

## 2020-12-18 ENCOUNTER — Other Ambulatory Visit: Payer: Self-pay

## 2020-12-18 ENCOUNTER — Encounter: Payer: Self-pay | Admitting: Nurse Practitioner

## 2020-12-18 ENCOUNTER — Ambulatory Visit: Payer: Medicare PPO | Admitting: Nurse Practitioner

## 2020-12-18 VITALS — BP 138/60 | HR 76 | Temp 97.5°F | Wt 120.6 lb

## 2020-12-18 DIAGNOSIS — E559 Vitamin D deficiency, unspecified: Secondary | ICD-10-CM

## 2020-12-18 DIAGNOSIS — C50412 Malignant neoplasm of upper-outer quadrant of left female breast: Secondary | ICD-10-CM

## 2020-12-18 DIAGNOSIS — I700 Atherosclerosis of aorta: Secondary | ICD-10-CM | POA: Diagnosis not present

## 2020-12-18 DIAGNOSIS — R6889 Other general symptoms and signs: Secondary | ICD-10-CM

## 2020-12-18 DIAGNOSIS — I10 Essential (primary) hypertension: Secondary | ICD-10-CM | POA: Diagnosis not present

## 2020-12-18 DIAGNOSIS — C50912 Malignant neoplasm of unspecified site of left female breast: Secondary | ICD-10-CM | POA: Diagnosis not present

## 2020-12-18 DIAGNOSIS — Z0001 Encounter for general adult medical examination with abnormal findings: Secondary | ICD-10-CM

## 2020-12-18 DIAGNOSIS — E785 Hyperlipidemia, unspecified: Secondary | ICD-10-CM

## 2020-12-18 DIAGNOSIS — D0361 Melanoma in situ of right upper limb, including shoulder: Secondary | ICD-10-CM

## 2020-12-18 DIAGNOSIS — Z0000 Encounter for general adult medical examination without abnormal findings: Secondary | ICD-10-CM

## 2020-12-18 DIAGNOSIS — M1711 Unilateral primary osteoarthritis, right knee: Secondary | ICD-10-CM

## 2020-12-18 DIAGNOSIS — D692 Other nonthrombocytopenic purpura: Secondary | ICD-10-CM

## 2020-12-18 DIAGNOSIS — R7309 Other abnormal glucose: Secondary | ICD-10-CM

## 2020-12-18 DIAGNOSIS — K8689 Other specified diseases of pancreas: Secondary | ICD-10-CM | POA: Diagnosis not present

## 2020-12-18 DIAGNOSIS — Z9181 History of falling: Secondary | ICD-10-CM

## 2020-12-18 DIAGNOSIS — Z79899 Other long term (current) drug therapy: Secondary | ICD-10-CM

## 2020-12-18 NOTE — Patient Instructions (Signed)

## 2020-12-19 LAB — CBC WITH DIFFERENTIAL/PLATELET
Absolute Monocytes: 467 cells/uL (ref 200–950)
Basophils Absolute: 29 cells/uL (ref 0–200)
Basophils Relative: 0.5 %
Eosinophils Absolute: 80 cells/uL (ref 15–500)
Eosinophils Relative: 1.4 %
HCT: 37.2 % (ref 35.0–45.0)
Hemoglobin: 12.8 g/dL (ref 11.7–15.5)
Lymphs Abs: 1488 cells/uL (ref 850–3900)
MCH: 34 pg — ABNORMAL HIGH (ref 27.0–33.0)
MCHC: 34.4 g/dL (ref 32.0–36.0)
MCV: 98.7 fL (ref 80.0–100.0)
MPV: 10.8 fL (ref 7.5–12.5)
Monocytes Relative: 8.2 %
Neutro Abs: 3637 cells/uL (ref 1500–7800)
Neutrophils Relative %: 63.8 %
Platelets: 247 10*3/uL (ref 140–400)
RBC: 3.77 10*6/uL — ABNORMAL LOW (ref 3.80–5.10)
RDW: 11.3 % (ref 11.0–15.0)
Total Lymphocyte: 26.1 %
WBC: 5.7 10*3/uL (ref 3.8–10.8)

## 2020-12-19 LAB — LIPID PANEL
Cholesterol: 204 mg/dL — ABNORMAL HIGH (ref <200)
HDL: 69 mg/dL (ref >=50)
LDL Cholesterol (Calc): 115 mg/dL (calc) — ABNORMAL HIGH
Non-HDL Cholesterol (Calc): 135 mg/dL (calc) — ABNORMAL HIGH (ref <130)
Total CHOL/HDL Ratio: 3 (calc) (ref <5.0)
Triglycerides: 95 mg/dL (ref <150)

## 2020-12-19 LAB — COMPLETE METABOLIC PANEL WITH GFR
AG Ratio: 2 (calc) (ref 1.0–2.5)
ALT: 17 U/L (ref 6–29)
AST: 21 U/L (ref 10–35)
Albumin: 4.3 g/dL (ref 3.6–5.1)
Alkaline phosphatase (APISO): 50 U/L (ref 37–153)
BUN: 20 mg/dL (ref 7–25)
CO2: 28 mmol/L (ref 20–32)
Calcium: 10.1 mg/dL (ref 8.6–10.4)
Chloride: 106 mmol/L (ref 98–110)
Creat: 0.67 mg/dL (ref 0.60–0.95)
Globulin: 2.2 g/dL (calc) (ref 1.9–3.7)
Glucose, Bld: 96 mg/dL (ref 65–99)
Potassium: 4 mmol/L (ref 3.5–5.3)
Sodium: 142 mmol/L (ref 135–146)
Total Bilirubin: 0.6 mg/dL (ref 0.2–1.2)
Total Protein: 6.5 g/dL (ref 6.1–8.1)
eGFR: 83 mL/min/{1.73_m2} (ref >=60)

## 2020-12-19 LAB — HEMOGLOBIN A1C
Hgb A1c MFr Bld: 5.6 % of total Hgb (ref <5.7)
Mean Plasma Glucose: 114 mg/dL
eAG (mmol/L): 6.3 mmol/L

## 2020-12-19 LAB — TSH: TSH: 1.51 mIU/L (ref 0.40–4.50)

## 2020-12-21 ENCOUNTER — Telehealth: Payer: Self-pay

## 2020-12-21 NOTE — Telephone Encounter (Signed)
Patient calling about lab results. Looks like they have not been resulted yet. Please advise.

## 2021-01-17 DIAGNOSIS — H35371 Puckering of macula, right eye: Secondary | ICD-10-CM | POA: Diagnosis not present

## 2021-01-17 DIAGNOSIS — Z961 Presence of intraocular lens: Secondary | ICD-10-CM | POA: Diagnosis not present

## 2021-01-17 DIAGNOSIS — H52203 Unspecified astigmatism, bilateral: Secondary | ICD-10-CM | POA: Diagnosis not present

## 2021-03-22 ENCOUNTER — Encounter: Payer: Self-pay | Admitting: Adult Health

## 2021-03-22 ENCOUNTER — Ambulatory Visit
Admission: RE | Admit: 2021-03-22 | Discharge: 2021-03-22 | Disposition: A | Discharge status: Home / Self Care | Payer: Medicare PPO | Source: Ambulatory Visit | Attending: Adult Health | Admitting: Adult Health

## 2021-03-22 ENCOUNTER — Other Ambulatory Visit: Payer: Self-pay

## 2021-03-22 ENCOUNTER — Ambulatory Visit: Payer: Medicare PPO | Admitting: Adult Health

## 2021-03-22 VITALS — BP 118/60 | HR 79 | Temp 97.7°F | Wt 116.0 lb

## 2021-03-22 DIAGNOSIS — M25561 Pain in right knee: Secondary | ICD-10-CM

## 2021-03-22 DIAGNOSIS — M7542 Impingement syndrome of left shoulder: Secondary | ICD-10-CM | POA: Diagnosis not present

## 2021-03-22 DIAGNOSIS — M25512 Pain in left shoulder: Secondary | ICD-10-CM

## 2021-03-22 DIAGNOSIS — M1711 Unilateral primary osteoarthritis, right knee: Secondary | ICD-10-CM | POA: Diagnosis not present

## 2021-03-22 DIAGNOSIS — G8929 Other chronic pain: Secondary | ICD-10-CM

## 2021-03-22 NOTE — Patient Instructions (Signed)
To get your xray:  ?Please get your shoulder xray at [address] [facility], can walk in without an appointment M-F, 8-4pm ? ? ? ? ?Try picking up tart cherry supplement - natural antiinflammatory ? ?A good alternative is turmeric with bioprene (black pepper fruit) ? ?Recommend voltaren 2-4 times a day on the shoulder ? ?Ice pack in soft cloth 15-20 min, can do every 3-4 hours if needed ? ?Avoid movements that cause the pain in shoulder  ? ?If the xray looks ok we will refer for physical therapy  ? ? ? ?Shoulder Impingement Syndrome ?Shoulder impingement syndrome is a condition that causes pain when connective tissues (tendons) surrounding the shoulder joint become pinched. These tendons are part of the group of muscles and tissues that help to stabilize the shoulder (rotator cuff). Beneath the rotator cuff is a fluid-filled sac (bursa) that allows the muscles and tendons to glide smoothly. ?The bursa may become swollen or irritated (bursitis). Bursitis, swelling in the rotator cuff tendons, or both conditions can decrease how much space is under a bone in the shoulder joint (acromion), resulting in impingement. ?What are the causes? ?Shoulder impingement syndrome may be caused by bursitis or swelling of the rotator cuff tendons, which may result from: ?Repetitive overhead arm movements. ?Falling onto the shoulder. ?Weakness in the shoulder muscles. ?What increases the risk? ?You may be more likely to develop this condition if you: ?Play sports that involve throwing, such as baseball. ?Participate in sports such as tennis, volleyball, and swimming. ?Work as a Curator, Games developer, or Architect. ?Some people are also more likely to develop impingement syndrome because of the shape of their acromion bone. ?What are the signs or symptoms? ?The main symptom of this condition is pain on the front or side of the shoulder. The pain may: ?Get worse when lifting or raising the arm. ?Get worse at night. ?Wake you  up from sleeping. ?Feel sharp when the shoulder is moved and then fade to an ache. ?Other symptoms may include: ?Tenderness. ?Stiffness. ?Inability to raise the arm above shoulder level or behind the body. ?Weakness. ?How is this diagnosed? ?This condition may be diagnosed based on: ?Your symptoms and medical history. ?A physical exam. ?Imaging tests, such as: ?X-rays. ?MRI. ?Ultrasound. ?How is this treated? ?This condition may be treated by: ?Resting your shoulder and avoiding all activities that cause pain or put stress on the shoulder. ?Icing your shoulder. ?NSAIDs to help reduce pain and swelling. ?One or more injections of medicines to numb the area and reduce inflammation. ?Physical therapy. ?Surgery. This may be needed if nonsurgical treatments have not helped. Surgery may involve repairing the rotator cuff, reshaping the acromion, or removing the bursa. ?Follow these instructions at home: ?Managing pain, stiffness, and swelling ? ?If directed, put ice on the injured area. ?Put ice in a plastic bag. ?Place a towel between your skin and the bag. ?Leave the ice on for 20 minutes, 2-3 times a day. ?Activity ?Rest and return to your normal activities as told by your health care provider. Ask your health care provider what activities are safe for you. ?Do exercises as told by your health care provider. ?General instructions ?Do not use any products that contain nicotine or tobacco, such as cigarettes, e-cigarettes, and chewing tobacco. These can delay healing. If you need help quitting, ask your health care provider. ?Ask your health care provider when it is safe for you to drive. ?Take over-the-counter and prescription medicines only as told by  your health care provider. ?Keep all follow-up visits as told by your health care provider. This is important. ?How is this prevented? ?Give your body time to rest between periods of activity. ?Be safe and responsible while being active. This will help you avoid  falls. ?Maintain physical fitness, including strength and flexibility. ?Contact a health care provider if: ?Your symptoms have not improved after 1-2 months of treatment and rest. ?You cannot lift your arm away from your body. ?Summary ?Shoulder impingement syndrome is a condition that causes pain when connective tissues (tendons) surrounding the shoulder joint become pinched. ?The main symptom of this condition is pain on the front or side of the shoulder. ?This condition is usually treated with rest, ice, and pain medicines as needed. ?This information is not intended to replace advice given to you by your health care provider. Make sure you discuss any questions you have with your health care provider. ?Document Revised: 05/01/2018 Document Reviewed: 07/02/2017 ?Elsevier Patient Education ? 2022 [place]. ? ? ?

## 2021-03-22 NOTE — Progress Notes (Signed)
Assessment and Plan: ? ?[name] was seen today for shoulder pain. ? ?Diagnoses and all orders for this visit: ? ?Impingement syndrome of left shoulder ?Acute pain of left shoulder ?Acute pain with active abduction suggestive of tendonitis with impingement; discussed process; avoid aggravating movements ?Will check XR for underlying structures ?Declines steroid/oral NSAID; encouraged QID  ?If any new weakness or worsening contact the office  ?-     DG Shoulder Left; Future ? ?Arthritis of right knee ?Chronic pain of right knee ?Declined surgery, manages with a cane, tylenol, voltaren ?Interested in strengthening exercises for knee with PT ?Will place referral with results for above once xray resulted ? ? ?Further disposition pending results of labs. Discussed med's effects and SE's.   ?Over 30 minutes of exam, counseling, chart review, and critical decision making was performed.  ? ?Future Appointments  ?Date Time Provider [place]  ?06/14/2021 10:00 AM Liane Comber, NP [facility] None  ?12/17/2021 10:00 AM Magda Bernheim, NP [facility] None  ? ? ?------------------------------------------------------------------------------------------------------------------ ? ? ?HPI ?BP 118/60   Pulse 79   Temp 97.7 ?F (36.5 ?C)   Wt 116 lb (52.6 kg)   SpO2 99%   BMI 20.71 kg/m?  ?88 y.o.female R handed, presents for evaluation of left/shoulder pain pain x 2 weeks.  ? ?She reports long history of intermittent stiff neck, had recurrence about 1 month ago, initially couldn't rotate neck but did improved, but started noting about 2 weeks ago when she was doing AM exercises, after a program with above the head reaching she started noting pain with reaching above her head, L shoulder, pain only with abduction, in shoulder and deltoid. Denies further radiation or weakness. She describes as a sharp sensation, brief. Improves with rest and tylenol.  ? ?She also has R knee arthritis, declined replacement, manages pain with  rest, tylenol and voltaren. She is interested in trying PT for this as well.  ? ?She prefers to avoid oral steroid or NSAID.  ? ?Past Medical History:  ?Diagnosis Date  ? Arthritis   ? knees  ? Breast cancer ([place]) 2019  ? Left Breast  ? Breast cancer ([place]) 2012  ? Right Breast  ? Cancer [facility]) 2012  ? right breast cancer  ? Hyperlipidemia   ? Melanoma ([place]) 2014  ? right humerus  ? Personal history of radiation therapy 2012  ? Right Breast Cancer  ? Personal history of radiation therapy 2019  ? Left Breast Cancer  ? Prediabetes   ? Vitamin D deficiency   ?  ? ?Allergies  ?Allergen Reactions  ? Anastrozole   ? Tramadol   ?  Medication caused dizziness  ? ? ?Current Outpatient Medications on File Prior to Visit  ?Medication Sig  ? acetaminophen (TYLENOL) 325 MG tablet Take 2 tablets (650 mg total) by mouth every 6 (six) hours as needed.  ? calcium carbonate (OS-CAL) 600 MG TABS Take 600 mg by mouth 2 (two) times daily with a meal.   ? Cholecalciferol (VITAMIN D) 1000 UNITS capsule Take 1,000 Units by mouth 2 (two) times daily.   ? hydrocortisone (ANUSOL-HC) 2.5 % rectal cream Place 1 application rectally 2 (two) times daily.  ? LYSINE HCL PO Take 500 mg by mouth daily.   ? Multiple Vitamins-Minerals (MULTIVITAMIN PO) Take by mouth daily.  ? Omega-3 Fatty Acids (FISH OIL PO) Take by mouth daily. Mega Red  ? ?No current facility-administered medications on file prior to visit.  ? ? ?ROS: all negative except above.  ? ?  Physical Exam: ? ?BP 118/60   Pulse 79   Temp 97.7 ?F (36.5 ?C)   Wt 116 lb (52.6 kg)   SpO2 99%   BMI 20.71 kg/m?  ? ?General Appearance: Well nourished, thin elder female in no apparent distress. ?Eyes: PERRLA, conjunctiva no swelling or erythema ?ENT/Mouth: mask in place; Hearing normal.  ?Neck: Supple ?Respiratory: Respiratory effort normal, BS equal bilaterally without rales, rhonchi, wheezing or stridor.  ?Cardio: RRR with no MRGs. Brisk peripheral pulses without edema.  ?Lymphatics: Non tender  without lymphadenopathy.  ?Musculoskeletal: slow steady gait with cane; R shoulder full ROM; L shoulder without palpable bony abnormality or deformity, no pain with internal/external rotation, anterior abduction. Has pain with active abduction past 90 degrees. No pain with passive abduction.  ?Skin: Warm, dry without rashes, lesions, ecchymosis.  ?Neuro: Normal muscle tone ?Psych: Awake and oriented X 3, normal affect, Insight and Judgment appropriate.  ? ? ?Izora Ribas, NP ?10:49 AM ?[facility] ? ?

## 2021-03-24 ENCOUNTER — Other Ambulatory Visit: Payer: Self-pay | Admitting: Adult Health

## 2021-03-24 DIAGNOSIS — M25512 Pain in left shoulder: Secondary | ICD-10-CM

## 2021-04-02 DIAGNOSIS — M25512 Pain in left shoulder: Secondary | ICD-10-CM | POA: Diagnosis not present

## 2021-04-02 DIAGNOSIS — M542 Cervicalgia: Secondary | ICD-10-CM | POA: Diagnosis not present

## 2021-06-14 ENCOUNTER — Ambulatory Visit: Payer: Medicare PPO | Admitting: Nurse Practitioner

## 2021-06-14 ENCOUNTER — Encounter: Payer: Self-pay | Admitting: Nurse Practitioner

## 2021-06-14 ENCOUNTER — Encounter: Payer: Medicare PPO | Admitting: Adult Health

## 2021-06-14 VITALS — BP 140/70 | HR 66 | Temp 97.5°F | Ht 62.5 in | Wt 118.8 lb

## 2021-06-14 DIAGNOSIS — Z1389 Encounter for screening for other disorder: Secondary | ICD-10-CM

## 2021-06-14 DIAGNOSIS — I10 Essential (primary) hypertension: Secondary | ICD-10-CM | POA: Diagnosis not present

## 2021-06-14 DIAGNOSIS — Z136 Encounter for screening for cardiovascular disorders: Secondary | ICD-10-CM

## 2021-06-14 DIAGNOSIS — K5730 Diverticulosis of large intestine without perforation or abscess without bleeding: Secondary | ICD-10-CM

## 2021-06-14 DIAGNOSIS — E538 Deficiency of other specified B group vitamins: Secondary | ICD-10-CM | POA: Diagnosis not present

## 2021-06-14 DIAGNOSIS — I700 Atherosclerosis of aorta: Secondary | ICD-10-CM

## 2021-06-14 DIAGNOSIS — R5383 Other fatigue: Secondary | ICD-10-CM | POA: Diagnosis not present

## 2021-06-14 DIAGNOSIS — R6889 Other general symptoms and signs: Secondary | ICD-10-CM | POA: Diagnosis not present

## 2021-06-14 DIAGNOSIS — Z0001 Encounter for general adult medical examination with abnormal findings: Secondary | ICD-10-CM | POA: Diagnosis not present

## 2021-06-14 DIAGNOSIS — H6123 Impacted cerumen, bilateral: Secondary | ICD-10-CM | POA: Diagnosis not present

## 2021-06-14 DIAGNOSIS — E559 Vitamin D deficiency, unspecified: Secondary | ICD-10-CM | POA: Diagnosis not present

## 2021-06-14 DIAGNOSIS — D692 Other nonthrombocytopenic purpura: Secondary | ICD-10-CM

## 2021-06-14 DIAGNOSIS — K644 Residual hemorrhoidal skin tags: Secondary | ICD-10-CM

## 2021-06-14 DIAGNOSIS — Z9181 History of falling: Secondary | ICD-10-CM

## 2021-06-14 DIAGNOSIS — R7309 Other abnormal glucose: Secondary | ICD-10-CM

## 2021-06-14 DIAGNOSIS — D0361 Melanoma in situ of right upper limb, including shoulder: Secondary | ICD-10-CM

## 2021-06-14 DIAGNOSIS — C50912 Malignant neoplasm of unspecified site of left female breast: Secondary | ICD-10-CM

## 2021-06-14 DIAGNOSIS — K8689 Other specified diseases of pancreas: Secondary | ICD-10-CM

## 2021-06-14 DIAGNOSIS — E785 Hyperlipidemia, unspecified: Secondary | ICD-10-CM

## 2021-06-14 DIAGNOSIS — Z853 Personal history of malignant neoplasm of breast: Secondary | ICD-10-CM

## 2021-06-14 DIAGNOSIS — Z79899 Other long term (current) drug therapy: Secondary | ICD-10-CM | POA: Diagnosis not present

## 2021-06-14 DIAGNOSIS — M1711 Unilateral primary osteoarthritis, right knee: Secondary | ICD-10-CM

## 2021-06-14 NOTE — Patient Instructions (Signed)
Hemorrhoids Hemorrhoids are swollen veins that may develop: In the butt (rectum). These are called internal hemorrhoids. Around the opening of the butt (anus). These are called external hemorrhoids. Hemorrhoids can cause pain, itching, or bleeding. Most of the time, they do not cause serious problems. They usually get better with diet changes, lifestyle changes, and other home treatments. What are the causes? This condition may be caused by: Having trouble pooping (constipation). Pushing hard (straining) to poop. Watery poop (diarrhea). Pregnancy. Being very overweight (obese). Sitting for long periods of time. Heavy lifting or other activity that causes you to strain. Anal sex. Riding a bike for a long period of time. What are the signs or symptoms? Symptoms of this condition include: Pain. Itching or soreness in the butt. Bleeding from the butt. Leaking poop. Swelling in the area. One or more lumps around the opening of your butt. How is this diagnosed? A doctor can often diagnose this condition by looking at the affected area. The doctor may also: Do an exam that involves feeling the area with a gloved hand (digital rectal exam). Examine the area inside your butt using a small tube (anoscope). Order blood tests. This may be done if you have lost a lot of blood. Have you get a test that involves looking inside the colon using a flexible tube with a camera on the end (sigmoidoscopy or colonoscopy). How is this treated? This condition can usually be treated at home. Your doctor may tell you to change what you eat, make lifestyle changes, or try home treatments. If these do not help, procedures can be done to remove the hemorrhoids or make them smaller. These may involve: Placing rubber bands at the base of the hemorrhoids to cut off their blood supply. Injecting medicine into the hemorrhoids to shrink them. Shining a type of light energy onto the hemorrhoids to cause them to fall  off. Doing surgery to remove the hemorrhoids or cut off their blood supply. Follow these instructions at home: Eating and drinking  Eat foods that have a lot of fiber in them. These include whole grains, beans, nuts, fruits, and vegetables. Ask your doctor about taking products that have added fiber (fibersupplements). Reduce the amount of fat in your diet. You can do this by: Eating low-fat dairy products. Eating less red meat. Avoiding processed foods. Drink enough fluid to keep your pee (urine) pale yellow. Managing pain and swelling  Take a warm-water bath (sitz bath) for 20 minutes to ease pain. Do this 3-4 times a day. You may do this in a bathtub or using a portable sitz bath that fits over the toilet. If told, put ice on the painful area. It may be helpful to use ice between your warm baths. Put ice in a plastic bag. Place a towel between your skin and the bag. Leave the ice on for 20 minutes, 2-3 times a day. General instructions Take over-the-counter and prescription medicines only as told by your doctor. Medicated creams and medicines may be used as told. Exercise often. Ask your doctor how much and what kind of exercise is best for you. Go to the bathroom when you have the urge to poop. Do not wait. Avoid pushing too hard when you poop. Keep your butt dry and clean. Use wet toilet paper or moist towelettes after pooping. Do not sit on the toilet for a long time. Keep all follow-up visits as told by your doctor. This is important. Contact a doctor if you: Have pain and   swelling that do not get better with treatment or medicine. Have trouble pooping. Cannot poop. Have pain or swelling outside the area of the hemorrhoids. Get help right away if you have: Bleeding that will not stop. Summary Hemorrhoids are swollen veins in the butt or around the opening of the butt. They can cause pain, itching, or bleeding. Eat foods that have a lot of fiber in them. These include  whole grains, beans, nuts, fruits, and vegetables. Take a warm-water bath (sitz bath) for 20 minutes to ease pain. Do this 3-4 times a day. This information is not intended to replace advice given to you by your health care provider. Make sure you discuss any questions you have with your health care provider. Document Revised: 07/19/2020 Document Reviewed: 07/19/2020 Elsevier Patient Education  2023 [employer]  

## 2021-06-14 NOTE — Progress Notes (Signed)
CPE  Assessment and Plan:  1. Encounter for general adult medical examination with abnormal findings Due Annually   2. Essential hypertension Controlled  Discussed DASH (Dietary Approaches to Stop Hypertension) DASH diet is lower in sodium than a typical American diet. Cut back on foods that are high in saturated fat, cholesterol, and trans fats. Eat more whole-grain foods, fish, poultry, and nuts Remain active and exercise as tolerated daily.  Monitor BP at home-Call if greater than 130/80.   - CBC with Differential/Platelet  3. Atherosclerosis of aorta ([place]) Per CT 07/2016 Control blood pressure, cholesterol, glucose Remain active.   - COMPLETE METABOLIC PANEL WITH GFR - Lipid panel  4. Senile purpura [facility]) Age related; reassured patient;  Protect skin, wear sunscreen, hats.  5. External hemorrhoids OTC Preparation H, witch hazel, stool softener. Discussed Hydrocortisone suppository, declines at this time. Stay well hydrated. RTC if s/s worsen or fail to improve. Continue to monitor.   6. Melanoma in situ of upper arm, right ([place]) Yearly skin checks with Dermatology.   Continue to monitor.   7. Unilateral primary osteoarthritis, right knee Continue follow up ortho. Continue topical NSAID. RICE method PRN. Declined surgery, managing fairly. Will continue to monitor.   8. Hyperlipidemia, unspecified hyperlipidemia type Controlled Continue supplements. Discussed lifestyle modifications. Recommended diet heavy in fruits and veggies, omega 3's. Decrease consumption of animal meats, cheeses, and dairy products. Remain active and exercise as tolerated. Continue to monitor.   - Lipid panel  9. Bilateral impacted cerumen Bilateral ear(s) irrigated with lukewarm water, hydrogen peroxide. Large piece of brown cerumen x2 extracted with ear curette. TM:  WNL.  Pt tolerated procedure well.    - Ear Lavage  10. Abnormal glucose Discussed disease progression and  risks Discussed diet/exercise, weight management and risk modification  - Hemoglobin A1c  11. Vitamin D deficiency Continue supplement  - VITAMIN D 25 Hydroxy (Vit-D Deficiency, Fractures)  12. History of right breast cancer Oncology follows Continue screenings.    13. 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 Denies concerns, flares.  14. Invasive ductal carcinoma of breast, female, left [facility]) Oncology follows Continue screenings.    15. Sigmoid diverticulosis Controlled. Discussed daily stool softener, PRN High fiber diet. Stay well hydrated.  Decrease caffeine. Increase activity level  16. At high risk for falls No recent falls. Move slowly. Contact guard, walker, cane. Stay well hydrated.  17. Other fatigue Adequate sleep. Increase fruits, veggies.  - Vitamin B12  18. Screening for cardiovascular condition  - EKG 12-Lead  19. Screening for hematuria or proteinuria  - Urinalysis, Routine w reflex microscopic  20. Medication management All medications discussed and reviewed in full. All questions and concerns regarding medications addressed.     - CBC with Differential/Platelet - COMPLETE METABOLIC PANEL WITH GFR - Lipid panel - Hemoglobin A1c - VITAMIN D 25 Hydroxy (Vit-D Deficiency, Fractures) - EKG 12-Lead - Urinalysis, Routine w reflex microscopic - Ear Lavage  Discussed med's effects and SE's. Screening labs and tests as requested with regular follow-up as recommended.  Future Appointments  Date Time Provider [place]  06/18/2022  2:00 PM Darrol Jump, NP [facility] None      Plan:    HPI  88 y.o. female  presents for CPE. She has Hyperlipidemia; Abnormal glucose; Vitamin D deficiency; Encounter for long-term (current) use of medications; History of right breast cancer; Pancreatic mass; Essential hypertension; Invasive ductal carcinoma of breast, female,  left ([address]);  Malignant neoplasm of upper-outer quadrant of left breast in female, estrogen receptor positive ([place]); Melanoma in situ of upper arm, right ([place]); At high risk for falls; Atherosclerosis of aorta ([place]); Unilateral primary osteoarthritis, right knee; Senile purpura ([place]); Sigmoid diverticulosis; and External hemorrhoids on their problem list.  She is widowed, 4 sons, 10 grandchildren, 73 grand kids.   She is still driving, she has some decreased hearing, she does her medications and does her bills. She lives alone. Family checks on her regularly.   She continues to complain of bilateral knee pain.  R>L.  She manages by utilizing topical creams (Voltaren),  Tylenol 3 x 500 mg tabs most days, resting, using cane.  She declines surgery.  Continues to follow with Ortho, [place] PRN, not recently. Denies any recent injury, fall.    Has occasional hemorrhoid flare.   She reports some slow BM movements, takes metamucil daily, eating prunes, Activia yogurt daily.  Stool softener PRN.  Tries to remain active.  Feels they are fairly controlled.  She has graduated from [facility] cancer [facility] for history of right breast cancer. 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  Stage IA., was briefly on anastrozole but couldn't tolerating, off now and still following with Dr. Griffith Citron. Last MGM 06/2019, has scheduled 07/2020.   Hx of melanoma of R arm s/p excision in 2014, follows with Dr. Marguerite Olea annually.   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 Patient reports did shared decision making with GI/Dr. Melford Aase and opted not to pursue.   BMI is Body mass index is 21.38 kg/m., she has been  working on diet and exercise, she does video program for stretching daily.  Wt Readings from Last 3 Encounters:  06/14/21 118 lb 12.8 oz (53.9 kg)  03/22/21 116 lb (52.6 kg)  12/18/20 120 lb 9.6 oz (54.7 kg)   She is not recently on any BP medications. today their BP is BP: 140/70.  She does workout. She denies chest pain, shortness of breath, dizziness.   She has aortic atherosclerosis per CT 07/2016.   She is not on cholesterol medication secondary to age and benign personal hx. Her cholesterol is not at goal. The cholesterol last visit was:  Lab Results  Component Value Date   CHOL 204 (H) 12/18/2020   HDL 69 12/18/2020   LDLCALC 115 (H) 12/18/2020   TRIG 95 12/18/2020   CHOLHDL 3.0 12/18/2020  . She has been working on diet and exercise for glucose management, she is not on bASA, she is not on ACE/ARB and denies foot ulcerations, hyperglycemia, hypoglycemia , increased appetite, nausea, paresthesia of the feet, polydipsia, polyuria, visual disturbances, vomiting and weight loss. Last A1C in the office was:  Lab Results  Component Value Date   HGBA1C 5.6 12/18/2020    Last GFR:  Lab Results  Component Value Date   GFRNONAA 82 06/14/2020   Patient is on Vitamin D supplement.   Lab Results  Component Value Date   VD25OH 76 06/14/2020       Current Medications:      Current Outpatient Medications (Analgesics):    acetaminophen (TYLENOL) 325 MG tablet, Take 500 mg by mouth in the morning, at noon, and  at bedtime.   Current Outpatient Medications (Other):    calcium carbonate (OS-CAL) 600 MG TABS, Take 600 mg by mouth 2 (two) times daily with a meal.    Cholecalciferol (VITAMIN D) 1000 UNITS capsule, Take 1,000 Units by mouth 2 (two) times daily.    hydrocortisone (ANUSOL-HC) 2.5 % rectal cream, Place 1 application rectally 2 (two) times daily.   LYSINE HCL PO, Take 500 mg by mouth daily.    Multiple Vitamins-Minerals (MULTIVITAMIN PO), Take by mouth daily.   Omega-3  Fatty Acids (FISH OIL PO), Take by mouth daily. Mega Red  Health Maintenance:   Immunization History  Administered Date(s) Administered   Influenza, High Dose Seasonal PF 11/08/2013, 10/04/2014, 11/13/2015, 10/31/2016, 11/12/2017, 10/06/2018   Influenza-Unspecified 11/10/2012, 10/04/2014, 10/13/2020   PFIZER(Purple Top)SARS-COV-2 Vaccination 02/05/2019, 02/26/2019, 12/23/2019   Pneumococcal Conjugate-13 07/27/2013   Pneumococcal-Unspecified 11/30/2010   Td 02/12/2009   Zoster Recombinat (Shingrix) 09/17/2018, 11/17/2018   Zoster, Live 02/12/2006    Tetanus: 2011 - get PRN, cost/age Pneumovax: 2012 Prevnar:  2015 Flu vaccine: 2021 - at pharmacy  Shingrix- 2/2, 2020 [place] 2/2, unsure if has had booster - send information through mychart  PAP 2014 declines further due to age MGM: 06/2019, has scheduled in July 2022 DEXA: 2011 normal, refuses further  Colonoscopy: 2004 declines further due to age  Last Eye Exam:  Dr. Ellie Lunch, Dec 2021 Last dental: New bridge placed, last 2022 Last derm: Follows annually, last 2021   Patient Care Team: Unk Pinto, MD as PCP - General (Internal Medicine) [name], MD as Consulting Physician (Gastroenterology) [name], MD as Referring Physician (Orthopedic Surgery) [name], MD as Consulting Physician (Dermatology) Magrinat, [name], MD (Inactive) as Consulting Physician (Oncology) Griselda Miner, MD as Consulting Physician (Dermatology) [name], MD as Consulting Physician (General Surgery)  Medical History:  Past Medical History:  Diagnosis Date   Arthritis    knees   Breast cancer [facility]) 2019   Left Breast   Breast cancer [facility]) 2012   Right Breast   Cancer [facility]) 2012   right breast cancer   Hyperlipidemia    Melanoma ([place]) 2014   right humerus   Personal history of radiation therapy 2012   Right Breast Cancer   Personal history of radiation therapy 2019   Left Breast Cancer   Prediabetes     Vitamin D deficiency    Allergies Allergies  Allergen Reactions   Anastrozole    Tramadol     Medication caused dizziness    SURGICAL HISTORY She  has a past surgical history that includes Cataract extraction (Bilateral, 2009); Cesarean section; Melanoma excision (Right, 2014); Breast biopsy (Right, 05/28/2010); Mastectomy, partial (Left, 07/29/2017); Breast lumpectomy (Right, 06/29/2010); and Breast lumpectomy (Left, 08/08/2017). FAMILY HISTORY Her family history includes Arthritis in her mother; Cancer in her sister; Diabetes in her maternal grandmother; Heart attack in her brother; Hypertension in her sister; Rheum arthritis in her mother. SOCIAL HISTORY She  reports that she quit smoking about 44 years ago. Her smoking use included cigarettes. She has never used smokeless tobacco. She reports current alcohol use. She reports that she does not use drugs.    Review of Systems: Review of Systems  Constitutional:  Negative for chills, fever, malaise/fatigue and weight loss.  HENT:  Negative for congestion, ear pain, hearing loss, sore throat and tinnitus.   Eyes: Negative.  Negative for blurred vision and double vision.  Respiratory:  Negative for cough, shortness of breath and wheezing.   Cardiovascular:  Negative for chest pain, palpitations, orthopnea, claudication and leg swelling.  Gastrointestinal:  Positive for constipation (Now every other day). Negative for abdominal pain, blood in stool, diarrhea, heartburn, melena, nausea and vomiting.  Genitourinary: Negative.   Musculoskeletal:  Positive for joint pain (R knee, chronic). Negative for back pain, falls, myalgias and neck pain.  Skin: Negative.  Negative for rash.  Neurological:  Negative for dizziness, tingling, sensory change, loss of consciousness, weakness and headaches.  Endo/Heme/Allergies:  Negative for polydipsia.  Psychiatric/Behavioral: Negative.  Negative for depression. The patient is not nervous/anxious and does  not have insomnia.   All other systems reviewed and are negative.  Physical Exam: Estimated body mass index is 21.38 kg/m as calculated from the following:   Height as of this encounter: 5' 2.5" (1.588 m).   Weight as of this encounter: 118 lb 12.8 oz (53.9 kg). BP 140/70   Pulse 66   Temp (!) 97.5 F (36.4 C)   Ht 5' 2.5" (1.588 m)   Wt 118 lb 12.8 oz (53.9 kg)   SpO2 99%   BMI 21.38 kg/m   General Appearance: Well nourished well developed, elderly female in no apparent distress.  Eyes: PERRLA, EOMs, conjunctiva no swelling or erythema ENT/Mouth: Ear canals bil obstruction with dry wax, non-tender auricle and tragus.  TMs no visualized.  Oropharynx moist and clear with no exudate, erythema, or swelling.   Neck: Supple, thyroid normal. No bruits.  No cervical adenopathy  Respiratory: Respiratory effort normal, Breath sounds clear A&P without wheeze, rhonchi, rales.   Cardio: RRR without murmurs, rubs or gallops. Brisk peripheral pulses without edema.  Chest: symmetric, with normal excursions Breasts: defer Abdomen: Soft, nontender, no guarding, rebound, hernias, masses, or organomegaly.  Lymphatics: Non tender without lymphadenopathy.  Genitourinary: defer Musculoskeletal: Full ROM all peripheral extremities, no effusion, 5/5 strength, and antalgic gait with 4 point cane Skin: Warm, dry without rashes, ecchymosis. Scattered brown nevi with variation - no immediately concerning lesions, derm follows for total body check annually Neuro: Awake and oriented X 3, Cranial nerves intact, reflexes equal bilaterally. Normal muscle tone, no cerebellar symptoms. Sensation intact.  Psych:  normal affect, Insight and Judgment appropriate.   EKG: mild sinus brady, IRBBB, NSCPT  Nainika Newlun 10:32 AM [place] [facility]

## 2021-06-15 LAB — CBC WITH DIFFERENTIAL/PLATELET
Absolute Monocytes: 547 cells/uL (ref 200–950)
Basophils Absolute: 29 cells/uL (ref 0–200)
Basophils Relative: 0.5 %
Eosinophils Absolute: 68 cells/uL (ref 15–500)
Eosinophils Relative: 1.2 %
HCT: 39.4 % (ref 35.0–45.0)
Hemoglobin: 13.2 g/dL (ref 11.7–15.5)
Lymphs Abs: 1721 cells/uL (ref 850–3900)
MCH: 33.2 pg — ABNORMAL HIGH (ref 27.0–33.0)
MCHC: 33.5 g/dL (ref 32.0–36.0)
MCV: 99.2 fL (ref 80.0–100.0)
MPV: 10.9 fL (ref 7.5–12.5)
Monocytes Relative: 9.6 %
Neutro Abs: 3335 cells/uL (ref 1500–7800)
Neutrophils Relative %: 58.5 %
Platelets: 243 10*3/uL (ref 140–400)
RBC: 3.97 10*6/uL (ref 3.80–5.10)
RDW: 11.7 % (ref 11.0–15.0)
Total Lymphocyte: 30.2 %
WBC: 5.7 10*3/uL (ref 3.8–10.8)

## 2021-06-15 LAB — URINALYSIS, ROUTINE W REFLEX MICROSCOPIC
Bilirubin Urine: NEGATIVE
Glucose, UA: NEGATIVE
Hgb urine dipstick: NEGATIVE
Ketones, ur: NEGATIVE
Leukocytes,Ua: NEGATIVE
Nitrite: NEGATIVE
Protein, ur: NEGATIVE
Specific Gravity, Urine: 1.009 (ref 1.001–1.035)
pH: 7.5 (ref 5.0–8.0)

## 2021-06-15 LAB — COMPLETE METABOLIC PANEL WITH GFR
AG Ratio: 2 (calc) (ref 1.0–2.5)
ALT: 16 U/L (ref 6–29)
AST: 18 U/L (ref 10–35)
Albumin: 4.5 g/dL (ref 3.6–5.1)
Alkaline phosphatase (APISO): 48 U/L (ref 37–153)
BUN: 18 mg/dL (ref 7–25)
CO2: 28 mmol/L (ref 20–32)
Calcium: 10.5 mg/dL — ABNORMAL HIGH (ref 8.6–10.4)
Chloride: 105 mmol/L (ref 98–110)
Creat: 0.67 mg/dL (ref 0.60–0.95)
Globulin: 2.3 g/dL (calc) (ref 1.9–3.7)
Glucose, Bld: 86 mg/dL (ref 65–99)
Potassium: 4.1 mmol/L (ref 3.5–5.3)
Sodium: 143 mmol/L (ref 135–146)
Total Bilirubin: 0.7 mg/dL (ref 0.2–1.2)
Total Protein: 6.8 g/dL (ref 6.1–8.1)
eGFR: 82 mL/min/{1.73_m2} (ref >=60)

## 2021-06-15 LAB — LIPID PANEL
Cholesterol: 232 mg/dL — ABNORMAL HIGH (ref <200)
HDL: 81 mg/dL (ref >=50)
LDL Cholesterol (Calc): 129 mg/dL (calc) — ABNORMAL HIGH
Non-HDL Cholesterol (Calc): 151 mg/dL (calc) — ABNORMAL HIGH (ref <130)
Total CHOL/HDL Ratio: 2.9 (calc) (ref <5.0)
Triglycerides: 110 mg/dL (ref <150)

## 2021-06-15 LAB — VITAMIN D 25 HYDROXY (VIT D DEFICIENCY, FRACTURES): Vit D, 25-Hydroxy: 81 ng/mL (ref 30–100)

## 2021-06-15 LAB — HEMOGLOBIN A1C
Hgb A1c MFr Bld: 5.5 % of total Hgb (ref <5.7)
Mean Plasma Glucose: 111 mg/dL
eAG (mmol/L): 6.2 mmol/L

## 2021-06-15 LAB — VITAMIN B12: Vitamin B-12: 580 pg/mL (ref 200–1100)

## 2021-08-27 ENCOUNTER — Ambulatory Visit: Payer: Medicare PPO | Admitting: Nurse Practitioner

## 2021-08-27 VITALS — BP 120/60 | HR 78 | Temp 97.5°F | Ht 62.5 in | Wt 114.0 lb

## 2021-08-27 DIAGNOSIS — K644 Residual hemorrhoidal skin tags: Secondary | ICD-10-CM

## 2021-08-27 DIAGNOSIS — K5901 Slow transit constipation: Secondary | ICD-10-CM

## 2021-08-27 DIAGNOSIS — Z79899 Other long term (current) drug therapy: Secondary | ICD-10-CM

## 2021-08-27 NOTE — Progress Notes (Signed)
Assessment and Plan:  Victoria Long was seen today for an episodic visit.  Diagnoses and all order for this visit:  1. External hemorrhoids Discussed importance of staying well hydrated for aide in peristaltic  movement. Keep BM soft by taking daily stool softener and [place] lax PRN.  May also take Metamucil. Remain active and exercise. Steroid suppository PRN when flared.  Meds ordered this encounter  Medications   hydrocortisone (ANUSOL-HC) 25 MG suppository    Sig: Place 1 suppository (25 mg total) rectally 2 (two) times daily as needed for anal itching or hemorrhoids.    Dispense:  30 suppository    Refill:  0    Order Specific Question:   Supervising Provider    Answer:   Unk Pinto [[id number]]     2. Slow transit constipation Stay well hydrated.  Remain active. High fiber diet.  3. Medication management All medications discussed and reviewed in full. All questions and concerns regarding medications addressed.      Continue to monitor for any increase in abdominal pain,  fever, chills, N/V, diarrhea, changes to bowel habits, blood in stool.  Notify office for further evaluation and treatment, questions or concerns if s/s fail to improve. The risks and benefits of my recommendations, as well as other treatment options were discussed with the patient today. Questions were answered.  Further disposition pending results of labs. Discussed med's effects and SE's.    Over 15 minutes of exam, counseling, chart review, and critical decision making was performed.   Future Appointments  Date Time Provider [place]  09/20/2021 11:30 AM Unk Pinto, MD [facility] None  12/25/2021 11:00 AM Darrol Jump, NP [facility] None  06/18/2022  2:00 PM Daking Westervelt, Kenney Houseman, NP [facility] None    ------------------------------------------------------------------------------------------------------------------   HPI BP 120/60   Pulse 78   Temp (!) 97.5 F (36.4 C)    Ht 5' 2.5" (1.588 m)   Wt 114 lb (51.7 kg)   SpO2 97%   BMI 20.52 kg/m   88 y.o.female presents for evaluation of hemorrhoid flare.  She has been trying to stay well hydrated.  Eating bananas, metamucil cookies, oatmeal.  Her normal BM is QOD.  Yesterday she was able to have a soft BM due to taking a fiber chew.  She does not take this daily.  She does not want to stay on a fiber supplement for the "rest of her life."  Denies N/V.    Past Medical History:  Diagnosis Date   Arthritis    knees   Breast cancer ([place]) 2019   Left Breast   Breast cancer ([place]) 2012   Right Breast   Cancer [facility]) 2012   right breast cancer   Hyperlipidemia    Melanoma ([place]) 2014   right humerus   Personal history of radiation therapy 2012   Right Breast Cancer   Personal history of radiation therapy 2019   Left Breast Cancer   Prediabetes    Vitamin D deficiency      Allergies  Allergen Reactions   Anastrozole    Tramadol     Medication caused dizziness    Current Outpatient Medications on File Prior to Visit  Medication Sig   acetaminophen (TYLENOL) 325 MG tablet Take 500 mg by mouth in the morning, at noon, and at bedtime.   calcium carbonate (OS-CAL) 600 MG TABS Take 600 mg by mouth 2 (two) times daily with a meal.    Cholecalciferol (VITAMIN D) 1000 UNITS capsule Take 1,000 Units  by mouth 2 (two) times daily.    hydrocortisone (ANUSOL-HC) 2.5 % rectal cream Place 1 application rectally 2 (two) times daily.   LYSINE HCL PO Take 500 mg by mouth daily.    Multiple Vitamins-Minerals (MULTIVITAMIN PO) Take by mouth daily.   Omega-3 Fatty Acids (FISH OIL PO) Take by mouth daily. Mega Red   No current facility-administered medications on file prior to visit.    ROS: all negative except what is noted in the HPI.   Physical Exam:  BP 120/60   Pulse 78   Temp (!) 97.5 F (36.4 C)   Ht 5' 2.5" (1.588 m)   Wt 114 lb (51.7 kg)   SpO2 97%   BMI 20.52 kg/m   General Appearance: NAD.   Awake, conversant and cooperative. Eyes: PERRLA, EOMs intact.  Sclera white.  Conjunctiva without erythema. Sinuses: No frontal/maxillary tenderness.  No nasal discharge. Nares patent.  ENT/Mouth: Ext aud canals clear.  Bilateral TMs w/DOL and without erythema or bulging. Hearing intact.  Posterior pharynx without swelling or exudate.  Tonsils without swelling or erythema.  Neck: Supple.  No masses, nodules or thyromegaly. Respiratory: Effort is regular with non-labored breathing. Breath sounds are equal bilaterally without rales, rhonchi, wheezing or stridor.  Cardio: RRR with no MRGs. Brisk peripheral pulses without edema.  Abdomen: Active BS in all four quadrants.  Soft and non-tender without guarding, rebound tenderness, hernias or masses. Lymphatics: Non tender without lymphadenopathy.  Musculoskeletal: Full ROM, 5/5 strength, normal ambulation.  No clubbing or cyanosis. Skin: Appropriate color for ethnicity. Warm without rashes, lesions, ecchymosis, ulcers.  Neuro: CN II-XII grossly normal. Normal muscle tone without cerebellar symptoms and intact sensation.   Psych: AO X 3,  appropriate mood and affect, insight and judgment.     Darrol Jump, NP 4:06 PM [facility]

## 2021-08-29 MED ORDER — HYDROCORTISONE ACETATE 25 MG RE SUPP: 25.0000 mg | Freq: Two times a day (BID) | RECTAL | 0 refills | Status: AC | PRN

## 2021-08-31 DIAGNOSIS — H0012 Chalazion right lower eyelid: Secondary | ICD-10-CM | POA: Diagnosis not present

## 2021-09-19 NOTE — Progress Notes (Signed)
Victoria Long                                                                                                                                                                                                                                                                                         Future Appointments  Date Time Provider Department  09/20/2021 11:30 AM Unk Pinto, MD [facility]  12/25/2021        6 mo & Wellness 11:00 AM Darrol Jump, NP [facility]  06/18/2022         CPE  2:00 PM Darrol Jump, NP [facility]    History of Present Illness:       [name] presents for 3 month follow up with HTN, HLD, Pre-Diabetes and Vitamin D Deficiency.        Patient is followed expectantly  for labile HTN  & BP has been controlled at home. Today's  . Patient has had no complaints of any cardiac type chest pain, palpitations, dyspnea / orthopnea / PND, dizziness, claudication, or dependent edema.       Hyperlipidemia is not controlled with diet & is not treated aggressively due to age.  Patient denies myalgias or other med SE's. Last Lipids were not at goal :  Lab Results  Component Value Date   CHOL 232 (H) 06/14/2021   HDL 81 06/14/2021   LDLCALC 129 (H) 06/14/2021   TRIG 110 06/14/2021   CHOLHDL 2.9 06/14/2021     Also, the patient has history of PreDiabetes  (A1c 5.8% / 2014) and has had no symptoms of reactive hypoglycemia, diabetic polys, paresthesias or visual blurring.  Last A1c was normal & at goal :  Lab Results  Component Value Date   HGBA1C 5.5 06/14/2021        Further, the patient also has history of Vitamin D Deficiency and supplements vitamin D .  Last vitamin D was at goal :  Lab Results  Component Value Date   VD25OH 81 06/14/2021  Current Outpatient Medications on File Prior to Visit  Medication Sig   acetaminophen (TYLENOL) 325 MG tablet Take 500 mg by mouth in the morning, at noon, and at bedtime.   calcium carbonate (OS-CAL) 600 MG TABS Take 600 mg by mouth 2 (two) times daily with a meal.    Cholecalciferol (VITAMIN D) 1000 UNITS capsule Take 1,000 Units by mouth 2 (two) times daily.    hydrocortisone (ANUSOL-HC) 2.5 % rectal cream Place 1 application rectally 2 (two) times daily.   hydrocortisone (ANUSOL-HC) 25 MG suppository Place 1 suppository (25 mg total) rectally 2 (two) times daily as needed for anal itching or hemorrhoids.   LYSINE HCL PO Take 500 mg by mouth daily.    Multiple Vitamins-Minerals (MULTIVITAMIN PO) Take by mouth daily.   Omega-3 Fatty Acids (FISH OIL PO) Take by mouth daily. Mega Red   No current facility-administered medications on file prior to visit.    Allergies  Allergen Reactions   Anastrozole    Tramadol     Medication caused dizziness    PMHx:   Past Medical History:  Diagnosis Date   Arthritis    knees   Breast cancer ([place]) 2019   Left Breast   Breast cancer ([place]) 2012   Right Breast   Cancer ([place]) 2012   right breast cancer   Hyperlipidemia    Melanoma ([place]) 2014   right humerus   Personal history of radiation therapy 2012   Right Breast Cancer   Personal history of radiation therapy 2019   Left Breast Cancer   Prediabetes    Vitamin D deficiency      Immunization History  Administered Date(s) Administered   Influenza, High Dose Seasonal PF 10/31/2016, 11/12/2017, 10/06/2018   Influenza-Unspecified 11/10/2012, 10/04/2014, 10/13/2020   PFIZER-SARS-COV-2 Vacc 02/05/2019, 02/26/2019, 12/23/2019   Pneumococcal - 13 07/27/2013   Pneumococcal - 23 11/30/2010   Td 02/12/2009   Zoster Recombinat (Shingrix) 09/17/2018, 11/17/2018   Zoster, Live 02/12/2006      Past Surgical History:  Procedure Laterality Date   BREAST BIOPSY Right 05/28/2010   BREAST LUMPECTOMY Right 06/29/2010   with radiation no chemo   BREAST LUMPECTOMY Left 08/08/2017   CATARACT EXTRACTION Bilateral 2009   CESAREAN SECTION     MASTECTOMY, PARTIAL Left 07/29/2017   Procedure: LEFT BREAST PARTIAL MASTECTOMY;  Surgeon: [name], MD;  Location: [facility];  Service: General;  Laterality: Left;   MELANOMA EXCISION Right 2014   humerus    FHx:    Reviewed / unchanged  SHx:    Reviewed / unchanged   Systems Review:  Constitutional: Denies fever, chills, wt changes, headaches, insomnia, fatigue, night sweats, change in appetite. Eyes: Denies redness, blurred vision, diplopia, discharge, itchy, watery eyes.  ENT: Denies discharge, congestion, post nasal drip, epistaxis, sore throat, earache, hearing loss, dental pain, tinnitus, vertigo, sinus pain, snoring.  CV: Denies chest pain, palpitations, irregular heartbeat, syncope, dyspnea, diaphoresis, orthopnea, PND, claudication or edema. Respiratory: denies cough, dyspnea, DOE, pleurisy, hoarseness, laryngitis, wheezing.  Gastrointestinal: Denies dysphagia, odynophagia, heartburn, reflux, water brash, abdominal pain or cramps, nausea, vomiting, bloating, diarrhea, constipation, hematemesis, melena, hematochezia  or hemorrhoids. Genitourinary: Denies dysuria, frequency, urgency, nocturia, hesitancy, discharge, hematuria or flank pain. Musculoskeletal: Denies arthralgias, myalgias, stiffness, jt. swelling, pain, limping or strain/sprain.  Skin: Denies pruritus, rash, hives, warts, acne, eczema or change in skin lesion(s). Neuro: No weakness, tremor, incoordination, spasms, paresthesia or pain. Psychiatric: Denies confusion, memory loss or sensory loss. Endo: Denies change in  weight, skin or hair change.  Heme/Lymph: No excessive bleeding, bruising or enlarged lymph nodes.  Physical Exam  There were no  vitals taken for this visit.  Appears  well nourished, well groomed  and in no distress.  Eyes: PERRLA, EOMs, conjunctiva no swelling or erythema. Sinuses: No frontal/maxillary tenderness ENT/Mouth: EAC's clear, TM's nl w/o erythema, bulging. Nares clear w/o erythema, swelling, exudates. Oropharynx clear without erythema or exudates. Oral hygiene is good. Tongue normal, non obstructing. Hearing intact.  Neck: Supple. Thyroid not palpable. Car 2+/2+ without bruits, nodes or JVD. Chest: Respirations nl with BS clear & equal w/o rales, rhonchi, wheezing or stridor.  Cor: Heart sounds normal w/ regular rate and rhythm without sig. murmurs, gallops, clicks or rubs. Peripheral pulses normal and equal  without edema.  Abdomen: Soft & bowel sounds normal. Non-tender w/o guarding, rebound, hernias, masses or organomegaly.  Lymphatics: Unremarkable.  Musculoskeletal: Full ROM all peripheral extremities, joint stability, 5/5 strength and normal gait.  Skin: Warm, dry without exposed rashes, lesions or ecchymosis apparent.  Neuro: Cranial nerves intact, reflexes equal bilaterally. Sensory-motor testing grossly intact. Tendon reflexes grossly intact.  Pysch: Alert & oriented x 3.  Insight and judgement nl & appropriate. No ideations.  Assessment and Plan:   1. Labile hypertension  - Continue medication, monitor blood pressure at home.  - Continue DASH diet.  Reminder to go to the ER if any CP,  SOB, nausea, dizziness, severe HA, changes vision/speech.      - CBC with Differential/Platelet - COMPLETE METABOLIC PANEL WITH GFR - Magnesium - TSH   2.   Hyperlipidemia, mixed  - Continue diet/meds, exercise,& lifestyle modifications.  - Continue monitor periodic cholesterol/liver & renal functions    3. Abnormal glucose  - Continue diet, exercise  - Lifestyle modifications.  - Monitor appropriate labs.    - Hemoglobin A1c - Insulin, random   4. Vitamin D deficiency  - Continue  supplementation.   - VITAMIN D 25 Hydroxy    5. Medication management  - CBC with Differential/Platelet - COMPLETE METABOLIC PANEL WITH GFR - Magnesium - TSH - Hemoglobin A1c - Insulin, random - VITAMIN D 25 Hydroxy          Discussed  regular exercise, BP monitoring, weight control to achieve/maintain BMI less than 25 and discussed med and SE's. Recommended labs to assess /monitor clinical status .  I discussed the assessment and treatment plan with the patient. The patient was provided an opportunity to ask questions and all were answered. The patient agreed with the plan and demonstrated an understanding of the instructions.  I provided over 30 minutes of exam, counseling, chart review and  complex critical decision making.        The patient was advised to call back or seek an in-person evaluation if the symptoms worsen or if the condition fails to improve as anticipated.   Kirtland Bouchard, MD

## 2021-09-19 NOTE — Patient Instructions (Signed)

## 2021-09-20 ENCOUNTER — Ambulatory Visit: Payer: Medicare PPO | Admitting: Internal Medicine

## 2021-09-20 DIAGNOSIS — R0989 Other specified symptoms and signs involving the circulatory and respiratory systems: Secondary | ICD-10-CM

## 2021-09-20 DIAGNOSIS — E782 Mixed hyperlipidemia: Secondary | ICD-10-CM

## 2021-09-20 DIAGNOSIS — E559 Vitamin D deficiency, unspecified: Secondary | ICD-10-CM

## 2021-09-20 DIAGNOSIS — Z79899 Other long term (current) drug therapy: Secondary | ICD-10-CM

## 2021-09-20 DIAGNOSIS — R7309 Other abnormal glucose: Secondary | ICD-10-CM

## 2021-10-02 NOTE — Progress Notes (Unsigned)
Future Appointments  Date Time Provider Department  09/20/2021 11:30 AM Unk Pinto, MD [facility]  12/25/2021        6 mo & Wellness 11:00 AM Darrol Jump, NP [facility]  06/18/2022         CPE  2:00 PM Darrol Jump, NP [facility]    History of Present Illness:       [name] presents for 3 month follow up with HTN, HLD, Pre-Diabetes and Vitamin D Deficiency.        Patient is followed expectantly  for labile HTN  & BP has been controlled at home. Today's  . Patient has had no complaints of any cardiac type chest pain, palpitations, dyspnea / orthopnea / PND, dizziness, claudication, or dependent edema.       Hyperlipidemia is not controlled with diet & is not treated aggressively due to age.  Patient denies myalgias or other med SE's. Last Lipids were not at goal :  Lab Results  Component Value Date   CHOL 232 (H) 06/14/2021   HDL 81 06/14/2021   LDLCALC 129 (H) 06/14/2021   TRIG 110 06/14/2021   CHOLHDL 2.9 06/14/2021     Also, the patient has history of PreDiabetes  (A1c 5.8% / 2014) and has had no symptoms of reactive hypoglycemia, diabetic polys, paresthesias or visual blurring.  Last A1c was normal & at goal :  Lab Results  Component Value Date   HGBA1C 5.5 06/14/2021        Further, the patient also has history of Vitamin D Deficiency and supplements vitamin D . Last vitamin D was at goal :  Lab Results  Component Value Date   VD25OH 81 06/14/2021     Current Outpatient Medications on File Prior to Visit  Medication Sig   acetaminophen (TYLENOL) 325 MG tablet Take 500 mg by mouth in the morning, at noon, and at bedtime.   calcium carbonate (OS-CAL) 600 MG TABS Take 600 mg by mouth 2 (two) times daily with a meal.    Cholecalciferol (VITAMIN D) 1000 UNITS capsule Take 1,000 Units by mouth 2 (two) times daily.    hydrocortisone (ANUSOL-HC) 2.5 % rectal cream Place 1 application rectally 2 (two) times daily.   LYSINE HCL 500 mg  Take daily.    Multiple Vitamins-Minerals  Take by mouth daily.   Omega-3 FISH OIL  Take by mouth daily. Mega Red     Allergies  Allergen Reactions   Anastrozole    Tramadol     Medication caused dizziness    PMHx:   Past Medical History:  Diagnosis Date   Arthritis    knees   Breast cancer ([place]) 2019   Left Breast   Breast cancer ([place]) 2012   Right Breast   Cancer [facility]) 2012   right breast cancer   Hyperlipidemia    Melanoma ([place]) 2014   right humerus   Personal history of radiation therapy 2012   Right Breast Cancer   Personal history of radiation therapy 2019   Left Breast Cancer   Prediabetes    Vitamin D deficiency      Immunization History  Administered Date(s) Administered   Influenza, High Dose Seasonal PF 10/31/2016, 11/12/2017, 10/06/2018   Influenza-Unspecified 11/10/2012, 10/04/2014, 10/13/2020   PFIZER-SARS-COV-2 Vacc 02/05/2019, 02/26/2019, 12/23/2019   Pneumococcal - 13 07/27/2013   Pneumococcal - 23 11/30/2010   Td 02/12/2009   Zoster Recombinat (Shingrix) 09/17/2018, 11/17/2018   Zoster, Live 02/12/2006  Past Surgical History:  Procedure Laterality Date   BREAST BIOPSY Right 05/28/2010   BREAST LUMPECTOMY Right 06/29/2010   with radiation no chemo   BREAST LUMPECTOMY Left 08/08/2017   CATARACT EXTRACTION Bilateral 2009   CESAREAN SECTION     MASTECTOMY, PARTIAL Left 07/29/2017   Procedure: LEFT BREAST PARTIAL MASTECTOMY;  Surgeon: [name], MD;  Location: [facility];  Service: General;  Laterality: Left;   MELANOMA EXCISION Right 2014   humerus    FHx:    Reviewed / unchanged  SHx:    Reviewed / unchanged   Systems Review:  Constitutional: Denies fever, chills, wt changes, headaches, insomnia, fatigue, night sweats, change in appetite. Eyes: Denies redness, blurred vision, diplopia, discharge, itchy, watery eyes.  ENT: Denies discharge, congestion, post nasal drip, epistaxis, sore throat, earache, hearing  loss, dental pain, tinnitus, vertigo, sinus pain, snoring.  CV: Denies chest pain, palpitations, irregular heartbeat, syncope, dyspnea, diaphoresis, orthopnea, PND, claudication or edema. Respiratory: denies cough, dyspnea, DOE, pleurisy, hoarseness, laryngitis, wheezing.  Gastrointestinal: Denies dysphagia, odynophagia, heartburn, reflux, water brash, abdominal pain or cramps, nausea, vomiting, bloating, diarrhea, constipation, hematemesis, melena, hematochezia  or hemorrhoids. Genitourinary: Denies dysuria, frequency, urgency, nocturia, hesitancy, discharge, hematuria or flank pain. Musculoskeletal: Denies arthralgias, myalgias, stiffness, jt. swelling, pain, limping or strain/sprain.  Skin: Denies pruritus, rash, hives, warts, acne, eczema or change in skin lesion(s). Neuro: No weakness, tremor, incoordination, spasms, paresthesia or pain. Psychiatric: Denies confusion, memory loss or sensory loss. Endo: Denies change in weight, skin or hair change.  Heme/Lymph: No excessive bleeding, bruising or enlarged lymph nodes.  Physical Exam  There were no vitals taken for this visit.  Appears  well nourished, well groomed  and in no distress.  Eyes: PERRLA, EOMs, conjunctiva no swelling or erythema. Sinuses: No frontal/maxillary tenderness ENT/Mouth: EAC's clear, TM's nl w/o erythema, bulging. Nares clear w/o erythema, swelling, exudates. Oropharynx clear without erythema or exudates. Oral hygiene is good. Tongue normal, non obstructing. Hearing intact.  Neck: Supple. Thyroid not palpable. Car 2+/2+ without bruits, nodes or JVD. Chest: Respirations nl with BS clear & equal w/o rales, rhonchi, wheezing or stridor.  Cor: Heart sounds normal w/ regular rate and rhythm without sig. murmurs, gallops, clicks or rubs. Peripheral pulses normal and equal  without edema.  Abdomen: Soft & bowel sounds normal. Non-tender w/o guarding, rebound, hernias, masses or organomegaly.  Lymphatics: Unremarkable.   Musculoskeletal: Full ROM all peripheral extremities, joint stability, 5/5 strength and normal gait.  Skin: Warm, dry without exposed rashes, lesions or ecchymosis apparent.  Neuro: Cranial nerves intact, reflexes equal bilaterally. Sensory-motor testing grossly intact. Tendon reflexes grossly intact.  Pysch: Alert & oriented x 3.  Insight and judgement nl & appropriate. No ideations.  Assessment and Plan:   1. Labile hypertension  - Continue medication, monitor blood pressure at home.  - Continue DASH diet.  Reminder to go to the ER if any CP,  SOB, nausea, dizziness, severe HA, changes vision/speech.      - CBC with Differential/Platelet - COMPLETE METABOLIC PANEL WITH GFR - Magnesium - TSH   2.   Hyperlipidemia, mixed  - Continue diet/meds, exercise,& lifestyle modifications.  - Continue monitor periodic cholesterol/liver & renal functions    3. Abnormal glucose  - Continue diet, exercise  - Lifestyle modifications.  - Monitor appropriate labs.    - Hemoglobin A1c - Insulin, random   4. Vitamin D deficiency  - Continue supplementation.   - VITAMIN D 25 Hydroxy  5. Medication management  - CBC with Differential/Platelet - COMPLETE METABOLIC PANEL WITH GFR - Magnesium - TSH - Hemoglobin A1c - Insulin, random - VITAMIN D 25 Hydroxy          Discussed  regular exercise, BP monitoring, weight control to achieve/maintain BMI less than 25 and discussed med and SE's. Recommended labs to assess /monitor clinical status .  I discussed the assessment and treatment plan with the patient. The patient was provided an opportunity to ask questions and all were answered. The patient agreed with the plan and demonstrated an understanding of the instructions.  I provided over 30 minutes of exam, counseling, chart review and  complex critical decision making.        The patient was advised to call back or seek an in-person evaluation if the symptoms worsen or if the  condition fails to improve as anticipated.   Kirtland Bouchard, MD

## 2021-10-03 ENCOUNTER — Encounter: Payer: Self-pay | Admitting: Internal Medicine

## 2021-10-03 ENCOUNTER — Ambulatory Visit: Payer: Medicare PPO | Admitting: Internal Medicine

## 2021-10-03 DIAGNOSIS — Z79899 Other long term (current) drug therapy: Secondary | ICD-10-CM

## 2021-10-03 DIAGNOSIS — R0989 Other specified symptoms and signs involving the circulatory and respiratory systems: Secondary | ICD-10-CM | POA: Diagnosis not present

## 2021-10-03 DIAGNOSIS — R7309 Other abnormal glucose: Secondary | ICD-10-CM | POA: Diagnosis not present

## 2021-10-03 DIAGNOSIS — E559 Vitamin D deficiency, unspecified: Secondary | ICD-10-CM | POA: Diagnosis not present

## 2021-10-03 DIAGNOSIS — E782 Mixed hyperlipidemia: Secondary | ICD-10-CM

## 2021-10-03 NOTE — Patient Instructions (Signed)

## 2021-10-04 LAB — CBC WITH DIFFERENTIAL/PLATELET
Absolute Monocytes: 479 cells/uL (ref 200–950)
Basophils Absolute: 50 cells/uL (ref 0–200)
Basophils Relative: 0.9 %
Eosinophils Absolute: 72 cells/uL (ref 15–500)
Eosinophils Relative: 1.3 %
HCT: 37.6 % (ref 35.0–45.0)
Hemoglobin: 13 g/dL (ref 11.7–15.5)
Lymphs Abs: 1507 cells/uL (ref 850–3900)
MCH: 34 pg — ABNORMAL HIGH (ref 27.0–33.0)
MCHC: 34.6 g/dL (ref 32.0–36.0)
MCV: 98.4 fL (ref 80.0–100.0)
MPV: 10.8 fL (ref 7.5–12.5)
Monocytes Relative: 8.7 %
Neutro Abs: 3394 cells/uL (ref 1500–7800)
Neutrophils Relative %: 61.7 %
Platelets: 243 10*3/uL (ref 140–400)
RBC: 3.82 10*6/uL (ref 3.80–5.10)
RDW: 11.7 % (ref 11.0–15.0)
Total Lymphocyte: 27.4 %
WBC: 5.5 10*3/uL (ref 3.8–10.8)

## 2021-10-04 LAB — COMPLETE METABOLIC PANEL WITH GFR
AG Ratio: 2.1 (calc) (ref 1.0–2.5)
ALT: 14 U/L (ref 6–29)
AST: 18 U/L (ref 10–35)
Albumin: 4.5 g/dL (ref 3.6–5.1)
Alkaline phosphatase (APISO): 51 U/L (ref 37–153)
BUN: 16 mg/dL (ref 7–25)
CO2: 25 mmol/L (ref 20–32)
Calcium: 10.5 mg/dL — ABNORMAL HIGH (ref 8.6–10.4)
Chloride: 105 mmol/L (ref 98–110)
Creat: 0.75 mg/dL (ref 0.60–0.95)
Globulin: 2.1 g/dL (calc) (ref 1.9–3.7)
Glucose, Bld: 125 mg/dL — ABNORMAL HIGH (ref 65–99)
Potassium: 4.1 mmol/L (ref 3.5–5.3)
Sodium: 141 mmol/L (ref 135–146)
Total Bilirubin: 0.5 mg/dL (ref 0.2–1.2)
Total Protein: 6.6 g/dL (ref 6.1–8.1)
eGFR: 75 mL/min/{1.73_m2} (ref >=60)

## 2021-10-04 LAB — HEMOGLOBIN A1C
Hgb A1c MFr Bld: 5.5 % of total Hgb (ref <5.7)
Mean Plasma Glucose: 111 mg/dL
eAG (mmol/L): 6.2 mmol/L

## 2021-10-04 LAB — LIPID PANEL
Cholesterol: 226 mg/dL — ABNORMAL HIGH (ref <200)
HDL: 77 mg/dL (ref >=50)
LDL Cholesterol (Calc): 126 mg/dL (calc) — ABNORMAL HIGH
Non-HDL Cholesterol (Calc): 149 mg/dL (calc) — ABNORMAL HIGH (ref <130)
Total CHOL/HDL Ratio: 2.9 (calc) (ref <5.0)
Triglycerides: 119 mg/dL (ref <150)

## 2021-10-04 LAB — INSULIN, RANDOM: Insulin: 17.1 u[IU]/mL

## 2021-10-04 LAB — MAGNESIUM: Magnesium: 2.2 mg/dL (ref 1.5–2.5)

## 2021-10-04 LAB — TSH: TSH: 1.54 mIU/L (ref 0.40–4.50)

## 2021-10-04 LAB — VITAMIN D 25 HYDROXY (VIT D DEFICIENCY, FRACTURES): Vit D, 25-Hydroxy: 73 ng/mL (ref 30–100)

## 2021-10-04 NOTE — Progress Notes (Signed)
<><><><><><><><><><><><><><><><><><><><><><><><><><><><><><><><><> <><><><><><><><><><><><><><><><><><><><><><><><><><><><><><><><><>  -   Chol  = 226  - too high   - Recommend low cholesterol diet   - Cholesterol only comes from animal sources                                                                - ie. meat, dairy, egg yolks  - Eat all the vegetables you want.  - Avoid Meat, Avoid Meat,  Avoid Meat                                                           - especially Red Meat - Beef AND Pork .  - Avoid cheese & dairy - milk & ice cream.     - Cheese is the most concentrated form of trans-fats which  is the worst thing to clog up our arteries.   - Veggie cheese is OK which can be found in the fresh  produce section at [employer] or [employer] or [employer]  <><><><><><><><><><><><><><><><><><><><><><><><><><><><><><><><><> <><><><><><><><><><><><><><><><><><><><><><><><><><><><><><><><><>  -  Vitamin  D = 73 - Excellent  - Please keep dose same  <><><><><><><><><><><><><><><><><><><><><><><><><><><><><><><><><>  - A1c - Normal - No Diabetes  - Great !  <><><><><><><><><><><><><><><><><><><><><><><><><><><><><><><><><>  - All Else - CBC - Kidneys - Electrolytes - Liver - Magnesium & Thyroid    - all  Normal / OK <><><><><><><><><><><><><><><><><><><><><><><><><><><><><><><><><> <><><><><><><><><><><><><><><><><><><><><><><><><><><><><><><><><>

## 2021-10-08 NOTE — Progress Notes (Signed)
Patient is aware of lab results and instructions. -e welch

## 2021-10-16 ENCOUNTER — Ambulatory Visit (INDEPENDENT_AMBULATORY_CARE_PROVIDER_SITE_OTHER): Payer: Medicare PPO

## 2021-10-16 ENCOUNTER — Ambulatory Visit: Payer: Medicare PPO | Admitting: Orthopaedic Surgery

## 2021-10-16 ENCOUNTER — Encounter: Payer: Self-pay | Admitting: Orthopaedic Surgery

## 2021-10-16 DIAGNOSIS — M25561 Pain in right knee: Secondary | ICD-10-CM

## 2021-10-16 DIAGNOSIS — M25551 Pain in right hip: Secondary | ICD-10-CM

## 2021-10-16 DIAGNOSIS — G8929 Other chronic pain: Secondary | ICD-10-CM | POA: Diagnosis not present

## 2021-10-16 NOTE — Progress Notes (Signed)
Office Visit Note   Patient: Victoria Long           Date of Birth: [birth year]           MRN: [id number] Visit Date: 10/16/2021              Requested by: Unk Pinto, [address],  [place] [address] PCP: Unk Pinto, MD   Assessment & Plan: Visit Diagnoses:  1. Chronic pain of right knee   2. Pain of right hip     Plan:  Discussed with the patient and her daughter-in-law is present throughout the exam today we will continue with conservative measures.  This would be periodic injections for the knee steroidandgelinjections Tylenol, topical Voltaren gel and knee sleeves.  In regards to her hip at this point time she does not have enough pain that she would be interested in having an intra-articular injection or a trochanteric injection.  Therefore she was shown some IT band stretching exercises to perform.  She will follow-up with [name] as needed.  Questions were encouraged and answered at length.  Follow-Up Instructions: No follow-ups on file.   Orders:  Orders Placed This Encounter  Procedures   XR HIP UNILAT W OR W/O PELVIS 2-3 VIEWS RIGHT   XR Knee 1-2 Views Right   No orders of the defined types were placed in this encounter.     Procedures: No procedures performed   Clinical Data: No additional findings.   Subjective: Chief Complaint  Patient presents with   Right Hip - Pain   Right Knee - Pain    HPI Victoria Long comes in today for right knee pain.  Also some right hip pain.  She was last seen by Dr. [name] July 2020 was given a gel injection she had to have a cortisone injection in her knee on 07/16/2018 also.  States the injections have helped in the past.  She is just having more increased knee pain and is using a cane.  She fell on Thursday on the left side due to pain in the knee.  She states that her pain is better with the use of knee sleeve and Tylenol.  She does apply Voltaren gel to the knee twice daily  usually.  Is also having some right groin pain and lateral hip pain.  But she states this pain is not "too bad".  Review of Systems Denies any fevers chills or ongoing infections  Objective: Vital Signs: There were no vitals taken for this visit.  Physical Exam General: Well-developed well-nourished pleasant female in no acute distress Ortho Exam Bilateral hips good range of motion of both hips.  Except for slightly limited internal rotation right hip.  Internal rotation does cause some pain in the right hip.  She has tenderness over the right trochanteric region.   Left knee full extension full flexion.  No instability valgus varus stressing.  No abnormal warmth erythema or effusion. Specialty Comments:  No specialty comments available.  Imaging: XR HIP UNILAT W OR W/O PELVIS 2-3 VIEWS RIGHT  Result Date: 10/16/2021 AP pelvis lateral view of the right hip: Bilateral hips well located.  No acute fractures.  Slight narrowing of the right hip compared to the left.  Periarticular spurs off the left femoral head.  Calcification left lower pelvic quadrant consistent with fibroid.  Otherwise no bony abnormalities.  XR Knee 1-2 Views Right  Result Date: 10/16/2021 Right knee 2 views: Knee is well located.  Bone-on-bone  medial compartment.  Lateral compartment overall well-preserved.  Chondrocalcinosis noted lateral compartment.  Mild to moderate patellofemoral changes.  No acute fractures.    PMFS History: Patient Active Problem List   Diagnosis Date Noted   External hemorrhoids 06/14/2020   Sigmoid diverticulosis 05/02/2020   Senile purpura ([place]) 01/11/2019   Unilateral primary osteoarthritis, right knee 08/13/2018   Atherosclerosis of aorta ([place]) 05/28/2018   Malignant neoplasm of upper-outer quadrant of left breast in female, estrogen receptor positive ([place]) 07/23/2017   Melanoma in situ of upper arm, right ([place]) 07/23/2017   At high risk for falls 07/23/2017   Invasive ductal  carcinoma of breast, female, left ([place]) 07/15/2017   Essential hypertension 10/01/2016   Pancreatic mass 08/22/2016   History of right breast cancer 10/25/2013   Encounter for long-term (current) use of medications 04/19/2013   Hyperlipidemia    Abnormal glucose    Vitamin D deficiency    Past Medical History:  Diagnosis Date   Arthritis    knees   Breast cancer ([place]) 2019   Left Breast   Breast cancer ([place]) 2012   Right Breast   Cancer ([place]) 2012   right breast cancer   Hyperlipidemia    Melanoma ([place]) 2014   right humerus   Personal history of radiation therapy 2012   Right Breast Cancer   Personal history of radiation therapy 2019   Left Breast Cancer   Prediabetes    Vitamin D deficiency     Family History  Problem Relation Age of Onset   Arthritis Mother    Rheum arthritis Mother    Hypertension Sister    Cancer Sister        type unknown   Diabetes Maternal Grandmother    Heart attack Brother     Past Surgical History:  Procedure Laterality Date   BREAST BIOPSY Right 05/28/2010   BREAST LUMPECTOMY Right 06/29/2010   with radiation no chemo   BREAST LUMPECTOMY Left 08/08/2017   CATARACT EXTRACTION Bilateral 2009   CESAREAN SECTION     MASTECTOMY, PARTIAL Left 07/29/2017   Procedure: LEFT BREAST PARTIAL MASTECTOMY;  Surgeon: [name], MD;  Location: [facility];  Service: General;  Laterality: Left;   MELANOMA EXCISION Right 2014   humerus   Social History   Occupational History   Not on file  Tobacco Use   Smoking status: Former    Years: 10.00    Types: Cigarettes    Quit date: 08/21/1976    Years since quitting: 45.1   Smokeless tobacco: Never  Vaping Use   Vaping Use: Never used  Substance and Sexual Activity   Alcohol use: Yes    Alcohol/week: 0.0 - 2.0 standard drinks of alcohol    Comment: wine   Drug use: No   Sexual activity: Not Currently    Birth control/protection: Post-menopausal

## 2021-11-05 ENCOUNTER — Ambulatory Visit: Payer: Medicare PPO | Admitting: Nurse Practitioner

## 2021-11-05 VITALS — BP 130/70 | HR 65 | Temp 97.3°F | Ht 62.5 in | Wt 113.0 lb

## 2021-11-05 DIAGNOSIS — R0989 Other specified symptoms and signs involving the circulatory and respiratory systems: Secondary | ICD-10-CM

## 2021-11-05 DIAGNOSIS — K644 Residual hemorrhoidal skin tags: Secondary | ICD-10-CM | POA: Diagnosis not present

## 2021-11-05 DIAGNOSIS — Z79899 Other long term (current) drug therapy: Secondary | ICD-10-CM

## 2021-11-05 DIAGNOSIS — H6123 Impacted cerumen, bilateral: Secondary | ICD-10-CM | POA: Diagnosis not present

## 2021-11-05 NOTE — Progress Notes (Signed)
Assessment and Plan:  Victoria Long was seen today for an episodic visit.  Diagnoses and all order for this visit:  1. Bilateral impacted cerumen Ceruminosis is noted.  Wax is removed by syringing and manual debridement.  Bilateral ear(s) irrigated with lukewarm water, hydrogen peroxide. Large piece of brown cerumen x3 extracted with ear curette. TM:  WNL.   Pt tolerated procedure well.   Instructions for home care to prevent wax buildup are given.  - Ear Lavage  2. External hemorrhoids Continue senokot Continue Hydrocortisone Referral to GI if s/s fail to improve  3. Medication management All medications discussed and reviewed in full. All questions and concerns regarding medications addressed.    4. Labile hypertension Discussed DASH (Dietary Approaches to Stop Hypertension) DASH diet is lower in sodium than a typical American diet. Cut back on foods that are high in saturated fat, cholesterol, and trans fats. Eat more whole-grain foods, fish, poultry, and nuts Remain active and exercise as tolerated daily.  Monitor BP at home-Call if greater than 130/80.  Check and monitor CMP/CBC  Notify office for further evaluation and treatment, questions or concerns if s/s fail to improve. The risks and benefits of my recommendations, as well as other treatment options were discussed with the patient today. Questions were answered.  Further disposition pending results of labs. Discussed med's effects and SE's.    Over 20 minutes of exam, counseling, chart review, and critical decision making was performed.   Future Appointments  Date Time Provider [place]  01/02/2022  3:30 PM Darrol Jump, NP [facility] None  06/18/2022  2:00 PM Darrol Jump, NP [facility] None  10/17/2022  3:30 PM Victoria Pinto, MD [facility] None    ------------------------------------------------------------------------------------------------------------------   HPI BP 130/70    Pulse 65   Temp (!) 97.3 F (36.3 C)   Ht 5' 2.5" (1.588 m)   Wt 113 lb (51.3 kg)   SpO2 99%   BMI 20.34 kg/m   88 y.o.female presents for evaluation of right ear cerumen impaction.  She has been using Debrox for ear wax removal and feels as though she has not had any success to the right ear.  The right ear continues to feel full and she has muffled hearing.  She has also treated the left ear with some improvement but does not the ear feeling full.  She continues to have external hemorrhoid irritation.  She has been taking a regular daily stool softener with some improvement ot her stool, more soft and easily passable.  She continues to use Anusol as directed.    Be has been well controlled in the home.  Elevated today in clinic.  She is asymptomatic.  She is not currently on any blood pressure control medications. BP Readings from Last 3 Encounters:  11/05/21 130/70  10/03/21 118/60  08/27/21 120/60      Past Medical History:  Diagnosis Date   Arthritis    knees   Breast cancer ([place]) 2019   Left Breast   Breast cancer ([place]) 2012   Right Breast   Cancer [facility]) 2012   right breast cancer   Hyperlipidemia    Melanoma ([place]) 2014   right humerus   Personal history of radiation therapy 2012   Right Breast Cancer   Personal history of radiation therapy 2019   Left Breast Cancer   Prediabetes    Vitamin D deficiency      Allergies  Allergen Reactions   Anastrozole    Tramadol  Medication caused dizziness    Current Outpatient Medications on File Prior to Visit  Medication Sig   acetaminophen (TYLENOL) 325 MG tablet Take 500 mg by mouth in the morning, at noon, and at bedtime.   calcium carbonate (OS-CAL) 600 MG TABS Take 600 mg by mouth 2 (two) times daily with a meal.    Cholecalciferol (VITAMIN D) 1000 UNITS capsule Take 1,000 Units by mouth 2 (two) times daily.    hydrocortisone (ANUSOL-HC) 2.5 % rectal cream Place 1 application rectally 2 (two) times daily.    LYSINE HCL PO Take 500 mg by mouth daily.    Multiple Vitamins-Minerals (MULTIVITAMIN PO) Take by mouth daily.   Omega-3 Fatty Acids (FISH OIL PO) Take by mouth daily. Mega Red   No current facility-administered medications on file prior to visit.    ROS: all negative except what is noted in the HPI.   Physical Exam:  BP 130/70   Pulse 65   Temp (!) 97.3 F (36.3 C)   Ht 5' 2.5" (1.588 m)   Wt 113 lb (51.3 kg)   SpO2 99%   BMI 20.34 kg/m   General Appearance: NAD.  Awake, conversant and cooperative. Eyes: PERRLA, EOMs intact.  Sclera white.  Conjunctiva without erythema. Sinuses: No frontal/maxillary tenderness.  No nasal discharge. Nares patent.  ENT/Mouth: Ext aud canals clear.  Bilateral TMs UTA d/t impacted cerumen.  Hearing intact.  Posterior pharynx without swelling or exudate.  Tonsils without swelling or erythema.  Neck: Supple.  No masses, nodules or thyromegaly. Respiratory: Effort is regular with non-labored breathing. Breath sounds are equal bilaterally without rales, rhonchi, wheezing or stridor.  Cardio: RRR with no MRGs. Brisk peripheral pulses without edema.  Abdomen: Active BS in all four quadrants.  Soft and non-tender without guarding, rebound tenderness, hernias or masses. Lymphatics: Non tender without lymphadenopathy.  Musculoskeletal: Full ROM, 5/5 strength, normal ambulation.  No clubbing or cyanosis. Skin: Appropriate color for ethnicity. Warm without rashes, lesions, ecchymosis, ulcers.  Neuro: CN II-XII grossly normal. Normal muscle tone without cerebellar symptoms and intact sensation.   Psych: AO X 3,  appropriate mood and affect, insight and judgment.     Darrol Jump, NP 3:43 PM [facility]

## 2021-11-05 NOTE — Patient Instructions (Signed)
Suggested ear wash recipe: 1 teaspoon of white vinegar. 1 teaspoon of rubbing alcohol. Mix the ingredients together in a cup. Use an eyedropper to put most of the mixture in the ear. Tilt the head to be sure the earwash runs all the way down inside the ear. Then tilt the head the other way and let the liquid drain out. Continue to monitor.    Earwax Buildup, Adult The ears produce a substance called earwax that helps keep bacteria out of the ear and protects the skin in the ear canal. Occasionally, earwax can build up in the ear and cause discomfort or hearing loss. What are the causes? This condition is caused by a buildup of earwax. Ear canals are self-cleaning. Ear wax is made in the outer part of the ear canal and generally falls out in small amounts over time. When the self-cleaning mechanism is not working, earwax builds up and can cause decreased hearing and discomfort. Attempting to clean ears with cotton swabs can push the earwax deep into the ear canal and cause decreased hearing and pain. What increases the risk? This condition is more likely to develop in people who: Clean their ears often with cotton swabs. Pick at their ears. Use earplugs or in-ear headphones often, or wear hearing aids. The following factors may also make you more likely to develop this condition: Being female. Being of older age. Naturally producing more earwax. Having narrow ear canals. Having earwax that is overly thick or sticky. Having excess hair in the ear canal. Having eczema. Being dehydrated. What are the signs or symptoms? Symptoms of this condition include: Reduced or muffled hearing. A feeling of fullness in the ear or feeling that the ear is plugged. Fluid coming from the ear. Ear pain or an itchy ear. Ringing in the ear. Coughing. Balance problems. An obvious piece of earwax that can be seen inside the ear canal. How is this diagnosed? This condition may be diagnosed based on: Your  symptoms. Your medical history. An ear exam. During the exam, your health care provider will look into your ear with an instrument called an otoscope. You may have tests, including a hearing test. How is this treated? This condition may be treated by: Using ear drops to soften the earwax. Having the earwax removed by a health care provider. The health care provider may: Flush the ear with water. Use an instrument that has a loop on the end (curette). Use a suction device. Having surgery to remove the wax buildup. This may be done in severe cases. Follow these instructions at home:  Take over-the-counter and prescription medicines only as told by your health care provider. Do not put any objects, including cotton swabs, into your ear. You can clean the opening of your ear canal with a washcloth or facial tissue. Follow instructions from your health care provider about cleaning your ears. Do not overclean your ears. Drink enough fluid to keep your urine pale yellow. This will help to thin the earwax. Keep all follow-up visits as told. If earwax builds up in your ears often or if you use hearing aids, consider seeing your health care provider for routine, preventive ear cleanings. Ask your health care provider how often you should schedule your cleanings. If you have hearing aids, clean them according to instructions from the manufacturer and your health care provider. Contact a health care provider if: You have ear pain. You develop a fever. You have pus or other fluid coming from your ear. You  have hearing loss. You have ringing in your ears that does not go away. You feel like the room is spinning (vertigo). Your symptoms do not improve with treatment. Get help right away if: You have bleeding from the affected ear. You have severe ear pain. Summary Earwax can build up in the ear and cause discomfort or hearing loss. The most common symptoms of this condition include reduced or  muffled hearing, a feeling of fullness in the ear, or feeling that the ear is plugged. This condition may be diagnosed based on your symptoms, your medical history, and an ear exam. This condition may be treated by using ear drops to soften the earwax or by having the earwax removed by a health care provider. Do not put any objects, including cotton swabs, into your ear. You can clean the opening of your ear canal with a washcloth or facial tissue. This information is not intended to replace advice given to you by your health care provider. Make sure you discuss any questions you have with your health care provider. Document Revised: 04/27/2019 Document Reviewed: 04/27/2019 Elsevier Patient Education  [place].

## 2021-12-04 ENCOUNTER — Ambulatory Visit (INDEPENDENT_AMBULATORY_CARE_PROVIDER_SITE_OTHER): Payer: Medicare PPO | Admitting: Nurse Practitioner

## 2021-12-04 ENCOUNTER — Encounter: Payer: Self-pay | Admitting: Nurse Practitioner

## 2021-12-04 ENCOUNTER — Ambulatory Visit
Admission: RE | Admit: 2021-12-04 | Discharge: 2021-12-04 | Disposition: A | Discharge status: Home / Self Care | Payer: Medicare PPO | Source: Ambulatory Visit | Attending: Nurse Practitioner | Admitting: Nurse Practitioner

## 2021-12-04 VITALS — BP 130/80 | HR 80 | Temp 97.9°F | Resp 16 | Ht 62.5 in | Wt 113.0 lb

## 2021-12-04 DIAGNOSIS — K5900 Constipation, unspecified: Secondary | ICD-10-CM | POA: Diagnosis not present

## 2021-12-04 DIAGNOSIS — R1031 Right lower quadrant pain: Secondary | ICD-10-CM

## 2021-12-04 DIAGNOSIS — R1032 Left lower quadrant pain: Secondary | ICD-10-CM

## 2021-12-04 DIAGNOSIS — K5901 Slow transit constipation: Secondary | ICD-10-CM | POA: Diagnosis not present

## 2021-12-04 DIAGNOSIS — R1030 Lower abdominal pain, unspecified: Secondary | ICD-10-CM | POA: Diagnosis not present

## 2021-12-04 NOTE — Progress Notes (Signed)
Assessment and Plan:  Victoria Long was seen today for an episodic visit.  Diagnoses and all order for this visit:  1. Slow transit constipation Stay well hydrated. Sample Linzess 145 mcg provided May start after review of Abd X-ray Discussed no longer taking Miralax if Linzess is started. Continue to eat a high fiber diet.  - DG Abd 2 Views; Future  2. Abdominal wall pain in left lower quadrant Possible diverticulitis flare but considering no other systemic presentation, will continue to monitor. May need further work up in [place] negative. Contact office if fever, chills, N/V, blood in stool noticed.  - DG Abd 2 Views; Future  3. Abdominal pain, right lower quadrant  - DG Abd 2 Views; Future  Notify office for further evaluation and treatment, questions or concerns if s/s fail to improve. The risks and benefits of my recommendations, as well as other treatment options were discussed with the patient today. Questions were answered.  Further disposition pending results of labs. Discussed med's effects and SE's.    Over 20 minutes of exam, counseling, chart review, and critical decision making was performed.   Future Appointments  Date Time Provider [place]  01/02/2022  3:30 PM Darrol Jump, NP [facility] None  06/18/2022  2:00 PM Darrol Jump, NP [facility] None  10/17/2022  3:30 PM Unk Pinto, MD [facility] None    ------------------------------------------------------------------------------------------------------------------   HPI BP 130/80   Pulse 80   Temp 97.9 F (36.6 C)   Resp 16   Ht 5' 2.5" (1.588 m)   Wt 113 lb (51.3 kg)   SpO2 99%   BMI 20.34 kg/m   Patient complains of constipation. She has a hx of slow transit constipation and takes Miralax and Senokot intermittently based on how her BM's are presenting.  She tries to stay well hydrated and eat a high fiber diet. Defecation has been difficult, incomplete, and  painful. Symptoms have gradually worsened. Denies blood in stool, N/V.  She does state that that the lower abdomen feels as though it is "rolling."  Doe snot describe as painful.  She is "gassy."  The uncomfortable feeling is most notable in the RLQ and LLQ.  She does feel a feeling of fullness in the rectal area.  She has a hx of hemorrhoids.   She is continung to use Anusol/hydrocortisone suppositories.  Past Medical History:  Diagnosis Date   Arthritis    knees   Breast cancer ([place]) 2019   Left Breast   Breast cancer ([place]) 2012   Right Breast   Cancer [facility]) 2012   right breast cancer   Hyperlipidemia    Melanoma ([place]) 2014   right humerus   Personal history of radiation therapy 2012   Right Breast Cancer   Personal history of radiation therapy 2019   Left Breast Cancer   Prediabetes    Vitamin D deficiency      Allergies  Allergen Reactions   Anastrozole    Tramadol     Medication caused dizziness    Current Outpatient Medications on File Prior to Visit  Medication Sig   acetaminophen (TYLENOL) 325 MG tablet Take 500 mg by mouth in the morning, at noon, and at bedtime.   calcium carbonate (OS-CAL) 600 MG TABS Take 600 mg by mouth 2 (two) times daily with a meal.    Cholecalciferol (VITAMIN D) 1000 UNITS capsule Take 1,000 Units by mouth 2 (two) times daily.    hydrocortisone (ANUSOL-HC) 2.5 % rectal cream Place  1 application rectally 2 (two) times daily.   LYSINE HCL PO Take 500 mg by mouth daily.    Multiple Vitamins-Minerals (MULTIVITAMIN PO) Take by mouth daily.   Omega-3 Fatty Acids (FISH OIL PO) Take by mouth daily. Mega Red   No current facility-administered medications on file prior to visit.    ROS: all negative except what is noted in the HPI.   Physical Exam:  BP 130/80   Pulse 80   Temp 97.9 F (36.6 C)   Resp 16   Ht 5' 2.5" (1.588 m)   Wt 113 lb (51.3 kg)   SpO2 99%   BMI 20.34 kg/m   General Appearance: NAD.  Awake, conversant and  cooperative. Eyes: PERRLA, EOMs intact.  Sclera white.  Conjunctiva without erythema. Sinuses: No frontal/maxillary tenderness.  No nasal discharge. Nares patent.  ENT/Mouth: Ext aud canals clear.  Bilateral TMs w/DOL and without erythema or bulging. Hearing intact.  Posterior pharynx without swelling or exudate.  Tonsils without swelling or erythema.  Neck: Supple.  No masses, nodules or thyromegaly. Respiratory: Effort is regular with non-labored breathing. Breath sounds are equal bilaterally without rales, rhonchi, wheezing or stridor.  Cardio: RRR with no MRGs. Brisk peripheral pulses without edema.  Abdomen: Active BS in all four quadrants.  Tender to palpation along all four quadrants. Non-distended. Lymphatics: Non tender without lymphadenopathy.  Musculoskeletal: Full ROM, 5/5 strength, normal ambulation.  No clubbing or cyanosis. Skin: Appropriate color for ethnicity. Warm without rashes, lesions, ecchymosis, ulcers.  Neuro: CN II-XII grossly normal. Normal muscle tone without cerebellar symptoms and intact sensation.   Psych: AO X 3,  appropriate mood and affect, insight and judgment.     Darrol Jump, NP 2:43 PM [facility]

## 2021-12-04 NOTE — Patient Instructions (Addendum)
Complete Abdominal X-ray to assess for any impaction or obstruction.  Sample of [place] provided - Do No Start until X-ray results have confirmed.  Constipation, Adult Constipation is when a person has fewer than three bowel movements in a week, has difficulty having a bowel movement, or has stools (feces) that are dry, hard, or larger than normal. Constipation may be caused by an underlying condition. It may become worse with age if a person takes certain medicines and does not take in enough fluids. Follow these instructions at home: Eating and drinking  Eat foods that have a lot of fiber, such as beans, whole grains, and fresh fruits and vegetables. Limit foods that are low in fiber and high in fat and processed sugars, such as fried or sweet foods. These include french fries, hamburgers, cookies, candies, and soda. Drink enough fluid to keep your urine pale yellow. General instructions Exercise regularly or as told by your health care provider. Try to do 150 minutes of moderate exercise each week. Use the bathroom when you have the urge to go. Do not hold it in. Take over-the-counter and prescription medicines only as told by your health care provider. This includes any fiber supplements. During bowel movements: Practice deep breathing while relaxing the lower abdomen. Practice pelvic floor relaxation. Watch your condition for any changes. Let your health care provider know about them. Keep all follow-up visits as told by your health care provider. This is important. Contact a health care provider if: You have pain that gets worse. You have a fever. You do not have a bowel movement after 4 days. You vomit. You are not hungry or you lose weight. You are bleeding from the opening between the buttocks (anus). You have thin, pencil-like stools. Get help right away if: You have a fever and your symptoms suddenly get worse. You leak stool or have blood in your stool. Your abdomen is  bloated. You have severe pain in your abdomen. You feel dizzy or you faint. Summary Constipation is when a person has fewer than three bowel movements in a week, has difficulty having a bowel movement, or has stools (feces) that are dry, hard, or larger than normal. Eat foods that have a lot of fiber, such as beans, whole grains, and fresh fruits and vegetables. Drink enough fluid to keep your urine pale yellow. Take over-the-counter and prescription medicines only as told by your health care provider. This includes any fiber supplements. This information is not intended to replace advice given to you by your health care provider. Make sure you discuss any questions you have with your health care provider. Document Revised: 11/25/2018 Document Reviewed: 11/25/2018 Elsevier Patient Education  [place].

## 2021-12-06 ENCOUNTER — Telehealth: Payer: Self-pay | Admitting: Nurse Practitioner

## 2021-12-06 ENCOUNTER — Other Ambulatory Visit: Payer: Self-pay | Admitting: Nurse Practitioner

## 2021-12-06 MED ORDER — LINACLOTIDE 145 MCG PO CAPS: 145.0000 ug | ORAL_CAPSULE | Freq: Every day | ORAL | 0 refills | Status: DC

## 2021-12-06 NOTE — Telephone Encounter (Signed)
Patient is aware of abdominal ultrasound results. But has questions on how she should take the Linzess

## 2021-12-06 NOTE — Telephone Encounter (Signed)
Patient informed. 

## 2021-12-11 ENCOUNTER — Telehealth: Payer: Self-pay | Admitting: Nurse Practitioner

## 2021-12-11 NOTE — Telephone Encounter (Signed)
Linzess gave her diarrhea and she says she is still having some slight constipation. Wanting to know how she should take it

## 2021-12-17 ENCOUNTER — Encounter: Payer: Medicare PPO | Admitting: Nurse Practitioner

## 2021-12-19 ENCOUNTER — Encounter: Payer: Medicare PPO | Admitting: Nurse Practitioner

## 2021-12-25 ENCOUNTER — Ambulatory Visit: Payer: Medicare PPO | Admitting: Nurse Practitioner

## 2022-01-02 ENCOUNTER — Ambulatory Visit: Payer: Medicare PPO | Admitting: Nurse Practitioner

## 2022-01-02 VITALS — BP 150/76 | HR 67 | Temp 97.3°F | Ht 62.5 in | Wt 112.4 lb

## 2022-01-02 DIAGNOSIS — Z79899 Other long term (current) drug therapy: Secondary | ICD-10-CM

## 2022-01-02 DIAGNOSIS — R6889 Other general symptoms and signs: Secondary | ICD-10-CM

## 2022-01-02 DIAGNOSIS — I700 Atherosclerosis of aorta: Secondary | ICD-10-CM | POA: Diagnosis not present

## 2022-01-02 DIAGNOSIS — R7309 Other abnormal glucose: Secondary | ICD-10-CM

## 2022-01-02 DIAGNOSIS — Z9181 History of falling: Secondary | ICD-10-CM

## 2022-01-02 DIAGNOSIS — K8689 Other specified diseases of pancreas: Secondary | ICD-10-CM

## 2022-01-02 DIAGNOSIS — C50912 Malignant neoplasm of unspecified site of left female breast: Secondary | ICD-10-CM

## 2022-01-02 DIAGNOSIS — Z0000 Encounter for general adult medical examination without abnormal findings: Secondary | ICD-10-CM

## 2022-01-02 DIAGNOSIS — I10 Essential (primary) hypertension: Secondary | ICD-10-CM

## 2022-01-02 DIAGNOSIS — C50412 Malignant neoplasm of upper-outer quadrant of left female breast: Secondary | ICD-10-CM | POA: Diagnosis not present

## 2022-01-02 DIAGNOSIS — Z170 Estrogen receptor positive status [ER+]: Secondary | ICD-10-CM

## 2022-01-02 DIAGNOSIS — D692 Other nonthrombocytopenic purpura: Secondary | ICD-10-CM

## 2022-01-02 DIAGNOSIS — D0361 Melanoma in situ of right upper limb, including shoulder: Secondary | ICD-10-CM | POA: Diagnosis not present

## 2022-01-02 DIAGNOSIS — M1711 Unilateral primary osteoarthritis, right knee: Secondary | ICD-10-CM

## 2022-01-02 DIAGNOSIS — Z0001 Encounter for general adult medical examination with abnormal findings: Secondary | ICD-10-CM | POA: Diagnosis not present

## 2022-01-02 DIAGNOSIS — E785 Hyperlipidemia, unspecified: Secondary | ICD-10-CM | POA: Diagnosis not present

## 2022-01-02 DIAGNOSIS — K5901 Slow transit constipation: Secondary | ICD-10-CM

## 2022-01-02 DIAGNOSIS — E559 Vitamin D deficiency, unspecified: Secondary | ICD-10-CM

## 2022-01-02 MED ORDER — LINACLOTIDE 72 MCG PO CAPS: 72.0000 ug | ORAL_CAPSULE | Freq: Every day | ORAL | 0 refills | Status: DC

## 2022-01-02 NOTE — Progress Notes (Signed)
MEDICARE ANNUAL WELLNESS VISIT AND FOLLOW UP  Assessment and Plan: Encounter for Medicare Annual Wellness Visit Due annually Health maintenance reviwed.  Senile purpura (HCC) Monitor CBC  Malignant neoplasm of upper-outer quadrant of left breast in female, estrogen receptor positive ([place]) Monitor  Atherosclerosis of aorta (HCC) Discussed lifestyle modifications. Recommended diet heavy in fruits and veggies, omega 3's. Decrease consumption of animal meats, cheeses, and dairy products. Remain active and exercise as tolerated. Continue to monitor. Check lipids/TSH  Melanoma in situ of upper arm, right ([place]) Continue follow up derm  Invasive ductal carcinoma of breast, female, left (HCC) Monitor  Essential hypertension Discussed DASH (Dietary Approaches to Stop Hypertension) DASH diet is lower in sodium than a typical American diet. Cut back on foods that are high in saturated fat, cholesterol, and trans fats. Eat more whole-grain foods, fish, poultry, and nuts Remain active and exercise as tolerated daily.  Monitor BP at home-Call if greater than 130/80.  Check CMP/CBC  Hyperlipidemia, unspecified hyperlipidemia type Discussed lifestyle modifications. Recommended diet heavy in fruits and veggies, omega 3's. Decrease consumption of animal meats, cheeses, and dairy products. Remain active and exercise as tolerated. Continue to monitor. Check lipids/TSH    Abnormal glucose Education: Reviewed 'ABCs' of diabetes management  Discussed goals to be met and/or maintained include A1C (<7) Blood pressure (<130/80) Cholesterol (LDL <70) Continue Eye Exam yearly  Continue Dental Exam Q6 mo Discussed dietary recommendations Discussed Physical Activity recommendations Foot exam UTD Check A1C   Vitamin D deficiency Continue supplement Monitor levels  Encounter for long-term (current) use of medications All medications discussed and reviewed in full. All questions and  concerns regarding medications addressed.    Pancreatic mass Monitor  At high risk for falls Continue cane, continue walking, can send PT as needed.   Unilateral primary osteoarthritis, right knee Continue follow up ortho Rest when flared Stay well hydrated  Constipation Continue Linzess Stay well hydrated Continue to monitor  Orders Placed This Encounter  Procedures   CBC with Differential/Platelet   COMPLETE METABOLIC PANEL WITH GFR   Lipid panel   Hemoglobin A1c    Notify office for further evaluation and treatment, questions or concerns if any reported s/s fail to improve.   The patient was advised to call back or seek an in-person evaluation if any symptoms worsen or if the condition fails to improve as anticipated.   Further disposition pending results of labs. Discussed med's effects and SE's.    I discussed the assessment and treatment plan with the patient. The patient was provided an opportunity to ask questions and all were answered. The patient agreed with the plan and demonstrated an understanding of the instructions.  Discussed med's effects and SE's. Screening labs and tests as requested with regular follow-up as recommended.  I provided 30 minutes of face-to-face time during this encounter including counseling, chart review, and critical decision making was preformed.   Future Appointments  Date Time Provider [place]  06/18/2022  2:00 PM Darrol Jump, NP [facility] None  10/17/2022  3:30 PM Unk Pinto, MD [facility] None    Plan:   During the course of the visit the patient was educated and counseled about appropriate screening and preventive services including:   Pneumococcal vaccine  Prevnar 13 Influenza vaccine Td vaccine Screening electrocardiogram Bone densitometry screening Colorectal cancer screening Diabetes screening Glaucoma screening Nutrition counseling  Advanced directives: requested   HPI  88 y.o. female   presents for a medicare and 3 month follow up.  Overall she reports feeling well.  She has recently been seen and treated for constipation.  Was provided Linzess which has provided relief and continues to be effective.    She has remote history of smoking, uncontrolled chol, remote history of melanoma and breast cancer.    She is not on any medications. today their BP is BP: (!) 150/76.  She does not workout due to knee pain. She denies chest pain, shortness of breath, dizziness.   BMI is Body mass index is 20.23 kg/m., she is working on diet and exercise. Wt Readings from Last 3 Encounters:  01/02/22 112 lb 6.4 oz (51 kg)  12/04/21 113 lb (51.3 kg)  11/05/21 113 lb (51.3 kg)   She has graduated from [facility] cancer center for history of right breast cancer. 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  Stage IA., was on anastrozole, off now and still following with Dr. Griffith Citron.   She has right knee pain, walking with cane, on 1-3 tylenol 532m, takes with food. Follows with Dr. BNinfa Linden   She is not on cholesterol medicationHer cholesterol is not at goal. She is trying to watch saturated fats and is exercising daily. The cholesterol last visit was:  Lab Results  Component Value Date   CHOL 207 (H) 01/02/2022   HDL 70 01/02/2022   LDLCALC 116 (H) 01/02/2022   TRIG 100 01/02/2022   CHOLHDL 3.0 01/02/2022  . She has been working on diet and exercise for prediabetes, she is on bASA, she is not on ACE/ARB and denies foot ulcerations, hyperglycemia, hypoglycemia , increased appetite, nausea, paresthesia of the feet, polydipsia, polyuria, visual disturbances, vomiting and weight loss. Last A1C in the office was:  Lab Results  Component Value Date   HGBA1C 5.7 (H) 01/02/2022   Patient is on Vitamin D supplement.   Lab Results  Component Value Date    VD25OH 78709/13/2023     She is seeing her dermatologist every 6 months for skin checks due to history of melanoma.    Current Medications:      Current Outpatient Medications (Analgesics):    acetaminophen (TYLENOL) 325 MG tablet, Take 500 mg by mouth in the morning, at noon, and at bedtime.   Current Outpatient Medications (Other):    calcium carbonate (OS-CAL) 600 MG TABS, Take 600 mg by mouth 2 (two) times daily with a meal.    Cholecalciferol (VITAMIN D) 1000 UNITS capsule, Take 1,000 Units by mouth 2 (two) times daily.    hydrocortisone (ANUSOL-HC) 2.5 % rectal cream, Place 1 application rectally 2 (two) times daily.   linaclotide (LINZESS) 72 MCG capsule, Take 1 capsule (72 mcg total) by mouth daily before breakfast.   LYSINE HCL PO, Take 500 mg by mouth daily.    Multiple Vitamins-Minerals (MULTIVITAMIN PO), Take by mouth daily.   Omega-3 Fatty Acids (FISH OIL PO), Take by mouth daily. Mega Red (Patient not taking: Reported on 01/02/2022)  Health Maintenance:   Immunization History  Administered Date(s) Administered   Influenza, High Dose Seasonal PF 11/08/2013, 10/04/2014, 11/13/2015, 10/31/2016, 11/12/2017, 10/06/2018   Influenza-Unspecified 11/10/2012, 10/04/2014, 10/13/2020   PFIZER(Purple Top)SARS-COV-2 Vaccination 02/05/2019, 02/26/2019, 12/23/2019   Pneumococcal Conjugate-13 07/27/2013   Pneumococcal-Unspecified 11/30/2010   Td 02/12/2009   Zoster Recombinat (Shingrix) 09/17/2018, 11/17/2018   Zoster, Live 02/12/2006   Health Maintenance  Topic Date Due   Medicare Annual Wellness (  AWV)  Never done   Pneumonia Vaccine 59+ Years old (2 - PPSV23 or PCV20) 07/28/2014   DTaP/Tdap/Td (2 - Tdap) 02/13/2019   INFLUENZA VACCINE  08/21/2021   COVID-19 Vaccine (4 - 2023-24 season) 09/21/2021   DEXA SCAN  Completed   Zoster Vaccines- Shingrix  Completed   HPV VACCINES  Aged Out   Tetanus: 2011 Pneumovax: 2012 Prevnar:  2015 Flu vaccine: 2022 Due Zostavax:   2008 [place] completed- will message dates Shingrix- confirmed  MGM: 07/2020 DEXA: 2011, refuses further Colonoscopy: 2004 declines further due to age Last Eye Exam:  Dr. Ellie Lunch, 2023 Dentist: Dr. Donn Pierini 2023 PAP 2014 declines further due to age  Patient Care Team: Unk Pinto, MD as PCP - General (Internal Medicine) [name], MD as Consulting Physician (Gastroenterology) [name], MD as Referring Physician (Orthopedic Surgery) [name], MD as Consulting Physician (Dermatology) Magrinat, [name], MD (Inactive) as Consulting Physician (Oncology) Griselda Miner, MD as Consulting Physician (Dermatology) [name], MD as Consulting Physician (General Surgery)  Medical History:  Past Medical History:  Diagnosis Date   Arthritis    knees   Breast cancer [facility]) 2019   Left Breast   Breast cancer [facility]) 2012   Right Breast   Cancer [facility]) 2012   right breast cancer   Hyperlipidemia    Melanoma ([place]) 2014   right humerus   Personal history of radiation therapy 2012   Right Breast Cancer   Personal history of radiation therapy 2019   Left Breast Cancer   Prediabetes    Vitamin D deficiency    Allergies Allergies  Allergen Reactions   Anastrozole    Tramadol     Medication caused dizziness    SURGICAL HISTORY She  has a past surgical history that includes Cataract extraction (Bilateral, 2009); Cesarean section; Melanoma excision (Right, 2014); Breast biopsy (Right, 05/28/2010); Mastectomy, partial (Left, 07/29/2017); Breast lumpectomy (Right, 06/29/2010); and Breast lumpectomy (Left, 08/08/2017). FAMILY HISTORY Her family history includes Arthritis in her mother; Cancer in her sister; Diabetes in her maternal grandmother; Heart attack in her brother; Hypertension in her sister; Rheum arthritis in her mother. SOCIAL HISTORY She  reports that she quit smoking about 45 years ago. Her smoking use included cigarettes. She has never used smokeless  tobacco. She reports current alcohol use. She reports that she does not use drugs.    MEDICARE WELLNESS OBJECTIVES: Physical activity:   Cardiac risk factors:   Depression/mood screen:      01/02/2022    4:27 PM  Depression screen PHQ 2/9  Decreased Interest 0  Down, Depressed, Hopeless 0  PHQ - 2 Score 0    ADLs:     01/02/2022    4:22 PM  In your present state of health, do you have any difficulty performing the following activities:  Hearing? 0  Vision? 0  Difficulty concentrating or making decisions? 0  Walking or climbing stairs? 0  Dressing or bathing? 0  Doing errands, shopping? 0  Preparing Food and eating ? N  Using the Toilet? N  In the past six months, have you accidently leaked urine? N  Do you have problems with loss of bowel control? N  Managing your Medications? N  Managing your Finances? N  Housekeeping or managing your Housekeeping? N     Cognitive Testing  Alert? Yes  Normal Appearance?Yes  Oriented to person? Yes  Place? Yes   Time? Yes  Recall of three objects?  Yes  Can perform simple calculations? Yes  Displays appropriate judgment?Yes  Can read the correct time from a watch face?Yes  EOL planning:      Review of Systems: Review of Systems  Constitutional:  Negative for chills, fever, malaise/fatigue and weight loss.  HENT:  Negative for congestion, ear pain, hearing loss and sore throat.   Eyes: Negative.  Negative for blurred vision and double vision.  Respiratory:  Negative for cough, shortness of breath and wheezing.   Cardiovascular:  Negative for chest pain, palpitations, orthopnea and leg swelling.  Gastrointestinal:  Negative for abdominal pain, blood in stool, constipation, diarrhea, heartburn, melena, nausea and vomiting.  Genitourinary: Negative.   Musculoskeletal:  Positive for joint pain (right knee). Negative for back pain, falls, myalgias and neck pain.  Skin: Negative.  Negative for rash.  Neurological:  Negative for  dizziness, tingling, tremors, sensory change, loss of consciousness and headaches.  Endo/Heme/Allergies:  Bruises/bleeds easily.  Psychiatric/Behavioral:  Negative for depression, memory loss and suicidal ideas. The patient is not nervous/anxious and does not have insomnia.     Physical Exam: Estimated body mass index is 20.23 kg/m as calculated from the following:   Height as of this encounter: 5' 2.5" (1.588 m).   Weight as of this encounter: 112 lb 6.4 oz (51 kg). BP (!) 150/76   Pulse 67   Temp (!) 97.3 F (36.3 C)   Ht 5' 2.5" (1.588 m)   Wt 112 lb 6.4 oz (51 kg)   SpO2 99%   BMI 20.23 kg/m   General Appearance: Well nourished well developed, in no apparent distress.  Eyes: PERRLA, EOMs, left eye with stye external upper lid, conjunctiva no swelling or erythema ENT/Mouth: Ear canals normal without obstruction after removal from left ear, swelling, erythema, or discharge.  TMs normal bilaterally with no erythema, bulging, retraction, or loss of landmark.  Oropharynx moist and clear with no exudate, erythema, or swelling.   Neck: Supple, thyroid normal. No bruits.  No cervical adenopathy Respiratory: Respiratory effort normal, Breath sounds clear A&P without wheeze, rhonchi, rales.   Cardio: RRR without murmurs, rubs or gallops. Brisk peripheral pulses without edema.  Chest: symmetric, with normal excursions Breasts: defer Abdomen: Soft, nontender, no guarding, rebound, hernias, masses, or organomegaly.  Lymphatics: Non tender without lymphadenopathy.  Genitourinary: defer Musculoskeletal: Full ROM all peripheral extremities,5/5 strength, and antalgic gait, negative straight leg. Right knee cracking heard with extension Skin: Warm, dry without rashes, lesions, ecchymosis. Neuro: Awake and oriented X 3, Cranial nerves intact, reflexes equal bilaterally. Normal muscle tone, no cerebellar symptoms. Sensation intact.  Psych:  normal affect, Insight and Judgment appropriate.     Medicare Attestation I have personally reviewed: The patient's medical and social history Their use of alcohol, tobacco or illicit drugs Their current medications and supplements The patient's functional ability including ADLs,fall risks, home safety risks, cognitive, and hearing and visual impairment Diet and physical activities Evidence for depression or mood disorders  The patient's weight, height, BMI, and visual acuity have been recorded in the chart.  I have made referrals, counseling, and provided education to the patient based on review of the above and I have provided the patient with a written personalized care plan for preventive services.  Jerilee Space 9:35 PM [facility]

## 2022-01-03 LAB — LIPID PANEL
Cholesterol: 207 mg/dL — ABNORMAL HIGH (ref <200)
HDL: 70 mg/dL (ref >=50)
LDL Cholesterol (Calc): 116 mg/dL (calc) — ABNORMAL HIGH
Non-HDL Cholesterol (Calc): 137 mg/dL (calc) — ABNORMAL HIGH (ref <130)
Total CHOL/HDL Ratio: 3 (calc) (ref <5.0)
Triglycerides: 100 mg/dL (ref <150)

## 2022-01-03 LAB — COMPLETE METABOLIC PANEL WITH GFR
AG Ratio: 1.9 (calc) (ref 1.0–2.5)
ALT: 12 U/L (ref 6–29)
AST: 18 U/L (ref 10–35)
Albumin: 4.2 g/dL (ref 3.6–5.1)
Alkaline phosphatase (APISO): 46 U/L (ref 37–153)
BUN: 20 mg/dL (ref 7–25)
CO2: 30 mmol/L (ref 20–32)
Calcium: 10.1 mg/dL (ref 8.6–10.4)
Chloride: 104 mmol/L (ref 98–110)
Creat: 0.79 mg/dL (ref 0.60–0.95)
Globulin: 2.2 g/dL (calc) (ref 1.9–3.7)
Glucose, Bld: 94 mg/dL (ref 65–99)
Potassium: 4.4 mmol/L (ref 3.5–5.3)
Sodium: 140 mmol/L (ref 135–146)
Total Bilirubin: 0.5 mg/dL (ref 0.2–1.2)
Total Protein: 6.4 g/dL (ref 6.1–8.1)
eGFR: 71 mL/min/{1.73_m2} (ref >=60)

## 2022-01-03 LAB — CBC WITH DIFFERENTIAL/PLATELET
Absolute Monocytes: 589 cells/uL (ref 200–950)
Basophils Absolute: 32 cells/uL (ref 0–200)
Basophils Relative: 0.6 %
Eosinophils Absolute: 97 cells/uL (ref 15–500)
Eosinophils Relative: 1.8 %
HCT: 35.6 % (ref 35.0–45.0)
Hemoglobin: 12.2 g/dL (ref 11.7–15.5)
Lymphs Abs: 1415 cells/uL (ref 850–3900)
MCH: 33.3 pg — ABNORMAL HIGH (ref 27.0–33.0)
MCHC: 34.3 g/dL (ref 32.0–36.0)
MCV: 97.3 fL (ref 80.0–100.0)
MPV: 10.8 fL (ref 7.5–12.5)
Monocytes Relative: 10.9 %
Neutro Abs: 3267 cells/uL (ref 1500–7800)
Neutrophils Relative %: 60.5 %
Platelets: 234 10*3/uL (ref 140–400)
RBC: 3.66 10*6/uL — ABNORMAL LOW (ref 3.80–5.10)
RDW: 11.4 % (ref 11.0–15.0)
Total Lymphocyte: 26.2 %
WBC: 5.4 10*3/uL (ref 3.8–10.8)

## 2022-01-03 LAB — HEMOGLOBIN A1C
Hgb A1c MFr Bld: 5.7 % of total Hgb — ABNORMAL HIGH (ref <5.7)
Mean Plasma Glucose: 117 mg/dL
eAG (mmol/L): 6.5 mmol/L

## 2022-01-06 NOTE — Patient Instructions (Signed)

## 2022-01-22 ENCOUNTER — Ambulatory Visit: Payer: Medicare PPO | Admitting: Nurse Practitioner

## 2022-01-22 ENCOUNTER — Encounter: Payer: Self-pay | Admitting: Nurse Practitioner

## 2022-01-22 VITALS — BP 108/56 | HR 86 | Temp 97.7°F | Ht 62.5 in | Wt 110.0 lb

## 2022-01-22 DIAGNOSIS — E441 Mild protein-calorie malnutrition: Secondary | ICD-10-CM | POA: Diagnosis not present

## 2022-01-22 DIAGNOSIS — K644 Residual hemorrhoidal skin tags: Secondary | ICD-10-CM | POA: Diagnosis not present

## 2022-01-22 DIAGNOSIS — K2100 Gastro-esophageal reflux disease with esophagitis, without bleeding: Secondary | ICD-10-CM

## 2022-01-22 DIAGNOSIS — I10 Essential (primary) hypertension: Secondary | ICD-10-CM | POA: Diagnosis not present

## 2022-01-22 DIAGNOSIS — R197 Diarrhea, unspecified: Secondary | ICD-10-CM | POA: Diagnosis not present

## 2022-01-22 MED ORDER — FAMOTIDINE 20 MG PO TABS: ORAL_TABLET | ORAL | 1 refills | Status: DC

## 2022-01-22 NOTE — Progress Notes (Signed)
Assessment and Plan:  Victoria Long was seen today for acute visit.  Diagnoses and all orders for this visit:  Gastroesophageal reflux disease with esophagitis without hemorrhage Begin Famotidine at night to help with stomach pain -     famotidine (PEPCID) 20 MG tablet; Take 1 tab QHS  External hemorrhoids Use Witch hazel wipes and preparation H  Mild protein-calorie malnutrition (HCC) Increase protein and calorie dense foods Boost or Ensure daily Monitor weight  Diarrhea, unspecified type Push fluids Begin Linzess every other day for the next 2 weeks to try to stabilize bowel pattern, if no improvement of diarrhea notify the office      Further disposition pending results of labs. Discussed med's effects and SE's.   Over 30 minutes of exam, counseling, chart review, and critical decision making was performed.   Future Appointments  Date Time Provider [place]  06/18/2022  2:00 PM Darrol Jump, NP [facility] None  10/17/2022  3:30 PM Unk Pinto, MD [facility] None    ------------------------------------------------------------------------------------------------------------------   HPI BP (!) 108/56   Pulse 86   Temp 97.7 F (36.5 C)   Ht 5' 2.5" (1.588 m)   Wt 110 lb (49.9 kg)   SpO2 99%   BMI 19.80 kg/m    89 y.o.female presents for diarrhea and excessive thirst from [place]. She was started on Linzess 12/04/21 for constipation. She had an xray 12/04/21 that showed: Nonobstructive pattern of bowel gas. Large burden of stool and stool balls throughout the colon and rectum.  She is having #6 & 7 on the stool chart. She will sometimes not have the ability to make it to the bathroom and have stool incontinence of liquid stool. Initially started with the 145 mcg and it caused issues, then started the 72 mcg and has taken 10 pills. Had used Miralax in the past and did not give any results.   Currently well controlled without medication.  Denies headaches,  chest pain, shortness of breath and dizziness,  BP Readings from Last 3 Encounters:  01/22/22 (!) 108/56  01/02/22 (!) 150/76  12/04/21 130/80   BMI is Body mass index is 19.8 kg/m., she has not been working on diet and exercise. She is not eating as much.  Wt Readings from Last 3 Encounters:  01/22/22 110 lb (49.9 kg)  01/02/22 112 lb 6.4 oz (51 kg)  12/04/21 113 lb (51.3 kg)   She has been noticing some stomach pain at night when she lays down that does sound consistent with GERD.  Has not tried any over the counter treatments.   Past Medical History:  Diagnosis Date   Arthritis    knees   Breast cancer ([place]) 2019   Left Breast   Breast cancer ([place]) 2012   Right Breast   Cancer [facility]) 2012   right breast cancer   Hyperlipidemia    Melanoma ([place]) 2014   right humerus   Personal history of radiation therapy 2012   Right Breast Cancer   Personal history of radiation therapy 2019   Left Breast Cancer   Prediabetes    Vitamin D deficiency      Allergies  Allergen Reactions   Anastrozole    Tramadol     Medication caused dizziness    Current Outpatient Medications on File Prior to Visit  Medication Sig   acetaminophen (TYLENOL) 325 MG tablet Take 500 mg by mouth in the morning, at noon, and at bedtime.   calcium carbonate (OS-CAL) 600 MG TABS Take 600 mg  by mouth 2 (two) times daily with a meal.    Cholecalciferol (VITAMIN D) 1000 UNITS capsule Take 1,000 Units by mouth 2 (two) times daily.    hydrocortisone (ANUSOL-HC) 2.5 % rectal cream Place 1 application rectally 2 (two) times daily.   linaclotide (LINZESS) 72 MCG capsule Take 1 capsule (72 mcg total) by mouth daily before breakfast.   LYSINE HCL PO Take 500 mg by mouth daily.    Multiple Vitamins-Minerals (MULTIVITAMIN PO) Take by mouth daily.   Omega-3 Fatty Acids (FISH OIL PO) Take by mouth daily. Mega Red (Patient not taking: Reported on 01/02/2022)   No current facility-administered medications on file prior  to visit.    ROS: all negative except above.   Physical Exam:  BP (!) 108/56   Pulse 86   Temp 97.7 F (36.5 C)   Ht 5' 2.5" (1.588 m)   Wt 110 lb (49.9 kg)   SpO2 99%   BMI 19.80 kg/m   General Appearance: Thin frail pleasant female, in no apparent distress. Eyes: PERRLA, EOMs, conjunctiva no swelling or erythema Sinuses: No Frontal/maxillary tenderness ENT/Mouth: Ext aud canals clear, TMs without erythema, bulging. No erythema, swelling, or exudate on post pharynx.  Tonsils not swollen or erythematous. Hearing normal.  Neck: Supple, thyroid normal.  Respiratory: Respiratory effort normal, BS equal bilaterally without rales, rhonchi, wheezing or stridor.  Cardio: RRR with no MRGs. Brisk peripheral pulses without edema.  Abdomen: Soft, + BS.  Non tender, no guarding, rebound, hernias, masses. Lymphatics: Non tender without lymphadenopathy.  Musculoskeletal: Full ROM, 5/5 strength, normal gait.  Skin: Warm, dry without rashes, lesions, ecchymosis.  Neuro: Cranial nerves intact. Normal muscle tone, no cerebellar symptoms. Sensation intact.  Psych: Awake and oriented X 3, normal affect, Insight and Judgment appropriate.     Alycia Rossetti, NP 4:13 PM [facility]

## 2022-01-22 NOTE — Patient Instructions (Signed)
Take Pepcid(famotidine) at night to help with stomach pain  Take Linzess every other day- if still having diarrhea after 2 weeks notify the office.  Increase food intake , fresh fruits and vegetables, increase protein.  Boost or ensure daily  Gastroesophageal Reflux Disease, Adult  Gastroesophageal reflux (GER) happens when acid from the stomach flows up into the tube that connects the mouth and the stomach (esophagus). Normally, food travels down the esophagus and stays in the stomach to be digested. With GER, food and stomach acid sometimes move back up into the esophagus. You may have a disease called gastroesophageal reflux disease (GERD) if the reflux: Happens often. Causes frequent or very bad symptoms. Causes problems such as damage to the esophagus. When this happens, the esophagus becomes sore and swollen. Over time, GERD can make small holes (ulcers) in the lining of the esophagus. What are the causes? This condition is caused by a problem with the muscle between the esophagus and the stomach. When this muscle is weak or not normal, it does not close properly to keep food and acid from coming back up from the stomach. The muscle can be weak because of: Tobacco use. Pregnancy. Having a certain type of hernia (hiatal hernia). Alcohol use. Certain foods and drinks, such as coffee, chocolate, onions, and peppermint. What increases the risk? Being overweight. Having a disease that affects your connective tissue. Taking NSAIDs, such a ibuprofen. What are the signs or symptoms? Heartburn. Difficult or painful swallowing. The feeling of having a lump in the throat. A bitter taste in the mouth. Bad breath. Having a lot of saliva. Having an upset or bloated stomach. Burping. Chest pain. Different conditions can cause chest pain. Make sure you see your doctor if you have chest pain. Shortness of breath or wheezing. A long-term cough or a cough at night. Wearing away of the  surface of teeth (tooth enamel). Weight loss. How is this treated? Making changes to your diet. Taking medicine. Having surgery. Treatment will depend on how bad your symptoms are. Follow these instructions at home: Eating and drinking  Follow a diet as told by your doctor. You may need to avoid foods and drinks such as: Coffee and tea, with or without caffeine. Drinks that contain alcohol. Energy drinks and sports drinks. Bubbly (carbonated) drinks or sodas. Chocolate and cocoa. Peppermint and mint flavorings. Garlic and onions. Horseradish. Spicy and acidic foods. These include peppers, chili powder, curry powder, vinegar, hot sauces, and BBQ sauce. Citrus fruit juices and citrus fruits, such as oranges, lemons, and limes. Tomato-based foods. These include red sauce, chili, salsa, and pizza with red sauce. Fried and fatty foods. These include donuts, french fries, potato chips, and high-fat dressings. High-fat meats. These include hot dogs, rib eye steak, sausage, ham, and bacon. High-fat dairy items, such as whole milk, butter, and cream cheese. Eat small meals often. Avoid eating large meals. Avoid drinking large amounts of liquid with your meals. Avoid eating meals during the 2-3 hours before bedtime. Avoid lying down right after you eat. Do not exercise right after you eat. Lifestyle  Do not smoke or use any products that contain nicotine or tobacco. If you need help quitting, ask your doctor. Try to lower your stress. If you need help doing this, ask your doctor. If you are overweight, lose an amount of weight that is healthy for you. Ask your doctor about a safe weight loss goal. General instructions Pay attention to any changes in your symptoms. Take over-the-counter and  prescription medicines only as told by your doctor. Do not take aspirin, ibuprofen, or other NSAIDs unless your doctor says it is okay. Wear loose clothes. Do not wear anything tight around your  waist. Raise (elevate) the head of your bed about 6 inches (15 cm). You may need to use a wedge to do this. Avoid bending over if this makes your symptoms worse. Keep all follow-up visits. Contact a doctor if: You have new symptoms. You lose weight and you do not know why. You have trouble swallowing or it hurts to swallow. You have wheezing or a cough that keeps happening. You have a hoarse voice. Your symptoms do not get better with treatment. Get help right away if: You have sudden pain in your arms, neck, jaw, teeth, or back. You suddenly feel sweaty, dizzy, or light-headed. You have chest pain or shortness of breath. You vomit and the vomit is green, yellow, or black, or it looks like blood or coffee grounds. You faint. Your poop (stool) is red, bloody, or black. You cannot swallow, drink, or eat. These symptoms may represent a serious problem that is an emergency. Do not wait to see if the symptoms will go away. Get medical help right away. Call your local emergency services (911 in the [place] Do not drive yourself to the hospital. Summary If a person has gastroesophageal reflux disease (GERD), food and stomach acid move back up into the esophagus and cause symptoms or problems such as damage to the esophagus. Treatment will depend on how bad your symptoms are. Follow a diet as told by your doctor. Take all medicines only as told by your doctor. This information is not intended to replace advice given to you by your health care provider. Make sure you discuss any questions you have with your health care provider. Document Revised: 07/19/2019 Document Reviewed: 07/19/2019 Elsevier Patient Education  [place].

## 2022-01-29 DIAGNOSIS — H52203 Unspecified astigmatism, bilateral: Secondary | ICD-10-CM | POA: Diagnosis not present

## 2022-01-29 DIAGNOSIS — H01005 Unspecified blepharitis left lower eyelid: Secondary | ICD-10-CM | POA: Diagnosis not present

## 2022-01-29 DIAGNOSIS — Z961 Presence of intraocular lens: Secondary | ICD-10-CM | POA: Diagnosis not present

## 2022-01-29 DIAGNOSIS — H01002 Unspecified blepharitis right lower eyelid: Secondary | ICD-10-CM | POA: Diagnosis not present

## 2022-01-29 DIAGNOSIS — H35371 Puckering of macula, right eye: Secondary | ICD-10-CM | POA: Diagnosis not present

## 2022-02-03 ENCOUNTER — Other Ambulatory Visit: Payer: Self-pay | Admitting: Nurse Practitioner

## 2022-02-03 DIAGNOSIS — Z79899 Other long term (current) drug therapy: Secondary | ICD-10-CM

## 2022-02-03 DIAGNOSIS — K5901 Slow transit constipation: Secondary | ICD-10-CM

## 2022-02-18 ENCOUNTER — Encounter: Payer: Self-pay | Admitting: Physician Assistant

## 2022-02-18 ENCOUNTER — Ambulatory Visit: Payer: Medicare PPO | Admitting: Physician Assistant

## 2022-02-18 DIAGNOSIS — M1711 Unilateral primary osteoarthritis, right knee: Secondary | ICD-10-CM | POA: Diagnosis not present

## 2022-02-18 MED ORDER — METHYLPREDNISOLONE ACETATE 40 MG/ML IJ SUSP
40.0000 mg | INTRAMUSCULAR | Status: AC | PRN
  Administered 2022-02-18: 40 mg via INTRA_ARTICULAR

## 2022-02-18 MED ORDER — LIDOCAINE HCL 2 % IJ SOLN
3.0000 mg | INTRAMUSCULAR | Status: AC | PRN
  Administered 2022-02-18: 3 mg

## 2022-02-18 MED ORDER — LIDOCAINE HCL 1 % IJ SOLN
3.0000 mL | INTRAMUSCULAR | Status: AC | PRN
  Administered 2022-02-18: 3 mL

## 2022-02-18 NOTE — Progress Notes (Signed)
   Procedure Note  Patient: Victoria Long             Date of Birth: 01/15/1929           MRN: [id number]             Visit Date: 02/18/2022 HPI: Victoria Long comes in today requesting right knee injection.  States last injection in September helped for about 3 months.  She has had no new injury to the knee.  She has had no change in her overall medical conditions.  Denies any fevers chills.  Review of systems: See HPI otherwise negative  Physical exam: Right knee full extension and flexion.  No abnormal warmth or erythema.  Procedures: Visit Diagnoses:  1. Unilateral primary osteoarthritis, right knee     Large Joint Inj: R knee on 02/18/2022 10:55 AM Details: 22 G 1.5 in needle, anterolateral approach  Arthrogram: No  Medications: 3 mL lidocaine 1 %; 3 mg lidocaine 2 %; 40 mg methylPREDNISolone acetate 40 MG/ML Outcome: tolerated well, no immediate complications Procedure, treatment alternatives, risks and benefits explained, specific risks discussed. Consent was given by the patient. Immediately prior to procedure a time out was called to verify the correct patient, procedure, equipment, support staff and site/side marked as required. Patient was prepped and draped in the usual sterile fashion.     Plan: She will follow-up with [name] as needed knows to wait at least 3 months between injections.  Questions were encouraged and answered

## 2022-02-19 ENCOUNTER — Telehealth: Payer: Self-pay | Admitting: Nurse Practitioner

## 2022-02-19 NOTE — Telephone Encounter (Signed)
Pt wants to update Hinton Dyer on Linzess, pt is still taking the medication every other day. She has a BM every 2-3 days, says bowels are not working well but did not want to elaborate bc I was not a nurse. Also said that she feels like she has hemorrhoids and it is painful at times to have a BM. She is wanting to know if she should go back to taking linzess everyday or add a stool softener to it. I let her know Hinton Dyer is out of the office due to the Flu but she said she still wants tot talk to a nurse.

## 2022-02-20 NOTE — Telephone Encounter (Signed)
Spoke with patient. She states that her bowel movements are not as soft as they were before she started the Linzess. They are also messing with her hemorrhoids. She will continue to take the [facility] every other day until Hinton Dyer gets back in and can look over the messages.

## 2022-02-22 NOTE — Telephone Encounter (Signed)
She was having issues taking the linzess every day as it was causing diarrhea.  If she is adjusted at the every other day but feels bowel movements are harder can try to go back to every day on the Linzess

## 2022-02-25 NOTE — Telephone Encounter (Signed)
LMOM to call back

## 2022-02-27 NOTE — Progress Notes (Unsigned)
Assessment and Plan:  There are no diagnoses linked to this encounter.    Further disposition pending results of labs. Discussed med's effects and SE's.   Over 30 minutes of exam, counseling, chart review, and critical decision making was performed.   Future Appointments  Date Time Provider [place]  02/28/2022  4:00 PM Alycia Rossetti, NP [facility] None  06/18/2022  2:00 PM Darrol Jump, NP [facility] None  10/17/2022  3:30 PM Unk Pinto, MD [facility] None    ------------------------------------------------------------------------------------------------------------------   HPI There were no vitals taken for this visit. 89 y.o.female presents for  Past Medical History:  Diagnosis Date   Arthritis    knees   Breast cancer ([place]) 2019   Left Breast   Breast cancer ([place]) 2012   Right Breast   Cancer [facility]) 2012   right breast cancer   Hyperlipidemia    Melanoma ([place]) 2014   right humerus   Personal history of radiation therapy 2012   Right Breast Cancer   Personal history of radiation therapy 2019   Left Breast Cancer   Prediabetes    Vitamin D deficiency      Allergies  Allergen Reactions   Anastrozole    Tramadol     Medication caused dizziness    Current Outpatient Medications on File Prior to Visit  Medication Sig   acetaminophen (TYLENOL) 325 MG tablet Take 500 mg by mouth in the morning, at noon, and at bedtime.   calcium carbonate (OS-CAL) 600 MG TABS Take 600 mg by mouth 2 (two) times daily with a meal.    Cholecalciferol (VITAMIN D) 1000 UNITS capsule Take 1,000 Units by mouth 2 (two) times daily.    famotidine (PEPCID) 20 MG tablet Take 1 tab QHS   hydrocortisone (ANUSOL-HC) 2.5 % rectal cream Place 1 application rectally 2 (two) times daily.   LINZESS 72 MCG capsule TAKE 1 CAPSULE BY MOUTH DAILY BEFORE BREAKFAST   LYSINE HCL PO Take 500 mg by mouth daily.    Multiple Vitamins-Minerals (MULTIVITAMIN PO) Take by mouth daily.    Omega-3 Fatty Acids (FISH OIL PO) Take by mouth daily. Mega Red (Patient not taking: Reported on 01/02/2022)   No current facility-administered medications on file prior to visit.    ROS: all negative except above.   Physical Exam:  There were no vitals taken for this visit.  General Appearance: Well nourished, in no apparent distress. Eyes: PERRLA, EOMs, conjunctiva no swelling or erythema Sinuses: No Frontal/maxillary tenderness ENT/Mouth: Ext aud canals clear, TMs without erythema, bulging. No erythema, swelling, or exudate on post pharynx.  Tonsils not swollen or erythematous. Hearing normal.  Neck: Supple, thyroid normal.  Respiratory: Respiratory effort normal, BS equal bilaterally without rales, rhonchi, wheezing or stridor.  Cardio: RRR with no MRGs. Brisk peripheral pulses without edema.  Abdomen: Soft, + BS.  Non tender, no guarding, rebound, hernias, masses. Lymphatics: Non tender without lymphadenopathy.  Musculoskeletal: Full ROM, 5/5 strength, normal gait.  Skin: Warm, dry without rashes, lesions, ecchymosis.  Neuro: Cranial nerves intact. Normal muscle tone, no cerebellar symptoms. Sensation intact.  Psych: Awake and oriented X 3, normal affect, Insight and Judgment appropriate.     Alycia Rossetti, NP 1:06 PM [facility]

## 2022-02-28 ENCOUNTER — Encounter: Payer: Self-pay | Admitting: Nurse Practitioner

## 2022-02-28 ENCOUNTER — Ambulatory Visit: Payer: Medicare PPO | Admitting: Nurse Practitioner

## 2022-02-28 VITALS — BP 128/62 | HR 68 | Temp 97.3°F | Ht 62.5 in | Wt 108.8 lb

## 2022-02-28 DIAGNOSIS — K5901 Slow transit constipation: Secondary | ICD-10-CM | POA: Diagnosis not present

## 2022-02-28 DIAGNOSIS — I10 Essential (primary) hypertension: Secondary | ICD-10-CM | POA: Diagnosis not present

## 2022-02-28 DIAGNOSIS — Z79899 Other long term (current) drug therapy: Secondary | ICD-10-CM

## 2022-02-28 MED ORDER — LINACLOTIDE 72 MCG PO CAPS: 72.0000 ug | ORAL_CAPSULE | Freq: Every day | ORAL | 0 refills | Status: DC

## 2022-02-28 NOTE — Patient Instructions (Addendum)
Linzess every day of the lower strength  Continue to use witch hazel wipes for hemorrhoids.   Constipation, Adult Constipation is when a person has fewer than three bowel movements in a week, has difficulty having a bowel movement, or has stools (feces) that are dry, hard, or larger than normal. Constipation may be caused by an underlying condition. It may become worse with age if a person takes certain medicines and does not take in enough fluids. Follow these instructions at home: Eating and drinking  Eat foods that have a lot of fiber, such as beans, whole grains, and fresh fruits and vegetables. Limit foods that are low in fiber and high in fat and processed sugars, such as fried or sweet foods. These include french fries, hamburgers, cookies, candies, and soda. Drink enough fluid to keep your urine pale yellow. General instructions Exercise regularly or as told by your health care provider. Try to do 150 minutes of moderate exercise each week. Use the bathroom when you have the urge to go. Do not hold it in. Take over-the-counter and prescription medicines only as told by your health care provider. This includes any fiber supplements. During bowel movements: Practice deep breathing while relaxing the lower abdomen. Practice pelvic floor relaxation. Watch your condition for any changes. Let your health care provider know about them. Keep all follow-up visits as told by your health care provider. This is important. Contact a health care provider if: You have pain that gets worse. You have a fever. You do not have a bowel movement after 4 days. You vomit. You are not hungry or you lose weight. You are bleeding from the opening between the buttocks (anus). You have thin, pencil-like stools. Get help right away if: You have a fever and your symptoms suddenly get worse. You leak stool or have blood in your stool. Your abdomen is bloated. You have severe pain in your abdomen. You feel  dizzy or you faint. Summary Constipation is when a person has fewer than three bowel movements in a week, has difficulty having a bowel movement, or has stools (feces) that are dry, hard, or larger than normal. Eat foods that have a lot of fiber, such as beans, whole grains, and fresh fruits and vegetables. Drink enough fluid to keep your urine pale yellow. Take over-the-counter and prescription medicines only as told by your health care provider. This includes any fiber supplements. This information is not intended to replace advice given to you by your health care provider. Make sure you discuss any questions you have with your health care provider. Document Revised: 11/25/2018 Document Reviewed: 11/25/2018 Elsevier Patient Education  [place].

## 2022-03-05 ENCOUNTER — Telehealth: Payer: Self-pay

## 2022-03-05 NOTE — Telephone Encounter (Signed)
Patient called and said that she is taking the Linzess everyday since her last visit and now she is having diarrhea. Hinton Dyer had told her that if the everyday gave her diarrhea to go to 2 days on and one day off. I retold that to her and we will see how that goes.

## 2022-03-13 DIAGNOSIS — H04123 Dry eye syndrome of bilateral lacrimal glands: Secondary | ICD-10-CM | POA: Diagnosis not present

## 2022-03-25 ENCOUNTER — Other Ambulatory Visit: Payer: Self-pay | Admitting: Nurse Practitioner

## 2022-03-25 DIAGNOSIS — K2100 Gastro-esophageal reflux disease with esophagitis, without bleeding: Secondary | ICD-10-CM

## 2022-04-09 ENCOUNTER — Other Ambulatory Visit: Payer: Self-pay

## 2022-04-09 ENCOUNTER — Telehealth: Payer: Self-pay | Admitting: Nurse Practitioner

## 2022-04-09 DIAGNOSIS — K5901 Slow transit constipation: Secondary | ICD-10-CM

## 2022-04-09 DIAGNOSIS — Z79899 Other long term (current) drug therapy: Secondary | ICD-10-CM

## 2022-04-09 MED ORDER — LINACLOTIDE 72 MCG PO CAPS: 72.0000 ug | ORAL_CAPSULE | Freq: Every day | ORAL | 0 refills | Status: DC

## 2022-04-09 NOTE — Telephone Encounter (Signed)
Pt is requesting  refill on Linzess if dana thinks she should still take it to go to [employer] on file

## 2022-04-22 ENCOUNTER — Telehealth: Payer: Self-pay | Admitting: Orthopaedic Surgery

## 2022-04-22 NOTE — Telephone Encounter (Signed)
Please advise. She just had a cortisone inj on 02/18/22

## 2022-04-22 NOTE — Telephone Encounter (Signed)
[place] Daughter in law called in stating patient would like another injection possibly Gel but unsure if it would work being it did not work well the first time and she has gotten cortisone injections but they wear off pretty quickly and would like to know her option please call back Joelene Millin to explain options

## 2022-04-30 ENCOUNTER — Telehealth: Payer: Self-pay | Admitting: Orthopaedic Surgery

## 2022-04-30 NOTE — Telephone Encounter (Signed)
Pt daughter not on pt chart. Asked to call Maisie Fus called requesting to submit to insurance company for right knee gel injection. Please call pt's son Maisie Fus when approved for injections. Phone number is [phone]

## 2022-05-01 NOTE — Telephone Encounter (Signed)
VOB submitted for Monov[employer], right knee  

## 2022-05-09 ENCOUNTER — Other Ambulatory Visit: Payer: Self-pay | Admitting: Nurse Practitioner

## 2022-05-09 DIAGNOSIS — K5901 Slow transit constipation: Secondary | ICD-10-CM

## 2022-05-09 DIAGNOSIS — Z79899 Other long term (current) drug therapy: Secondary | ICD-10-CM

## 2022-06-03 ENCOUNTER — Other Ambulatory Visit: Payer: Self-pay

## 2022-06-03 DIAGNOSIS — M1711 Unilateral primary osteoarthritis, right knee: Secondary | ICD-10-CM

## 2022-06-10 ENCOUNTER — Other Ambulatory Visit: Payer: Self-pay | Admitting: Nurse Practitioner

## 2022-06-10 DIAGNOSIS — K2100 Gastro-esophageal reflux disease with esophagitis, without bleeding: Secondary | ICD-10-CM

## 2022-06-18 ENCOUNTER — Encounter: Payer: Self-pay | Admitting: Nurse Practitioner

## 2022-06-18 ENCOUNTER — Ambulatory Visit (INDEPENDENT_AMBULATORY_CARE_PROVIDER_SITE_OTHER): Payer: Medicare PPO | Admitting: Nurse Practitioner

## 2022-06-18 VITALS — BP 128/62 | HR 73 | Temp 97.8°F | Ht 62.5 in | Wt 112.2 lb

## 2022-06-18 DIAGNOSIS — Z1389 Encounter for screening for other disorder: Secondary | ICD-10-CM | POA: Diagnosis not present

## 2022-06-18 DIAGNOSIS — I700 Atherosclerosis of aorta: Secondary | ICD-10-CM

## 2022-06-18 DIAGNOSIS — I10 Essential (primary) hypertension: Secondary | ICD-10-CM | POA: Diagnosis not present

## 2022-06-18 DIAGNOSIS — M1711 Unilateral primary osteoarthritis, right knee: Secondary | ICD-10-CM

## 2022-06-18 DIAGNOSIS — Z79899 Other long term (current) drug therapy: Secondary | ICD-10-CM

## 2022-06-18 DIAGNOSIS — E559 Vitamin D deficiency, unspecified: Secondary | ICD-10-CM | POA: Diagnosis not present

## 2022-06-18 DIAGNOSIS — Z0001 Encounter for general adult medical examination with abnormal findings: Secondary | ICD-10-CM

## 2022-06-18 DIAGNOSIS — Z136 Encounter for screening for cardiovascular disorders: Secondary | ICD-10-CM | POA: Diagnosis not present

## 2022-06-18 DIAGNOSIS — K644 Residual hemorrhoidal skin tags: Secondary | ICD-10-CM

## 2022-06-18 DIAGNOSIS — R7309 Other abnormal glucose: Secondary | ICD-10-CM | POA: Diagnosis not present

## 2022-06-18 DIAGNOSIS — Z853 Personal history of malignant neoplasm of breast: Secondary | ICD-10-CM

## 2022-06-18 DIAGNOSIS — D692 Other nonthrombocytopenic purpura: Secondary | ICD-10-CM

## 2022-06-18 DIAGNOSIS — Z0000 Encounter for general adult medical examination without abnormal findings: Secondary | ICD-10-CM | POA: Diagnosis not present

## 2022-06-18 DIAGNOSIS — C50912 Malignant neoplasm of unspecified site of left female breast: Secondary | ICD-10-CM

## 2022-06-18 DIAGNOSIS — K8689 Other specified diseases of pancreas: Secondary | ICD-10-CM

## 2022-06-18 DIAGNOSIS — E785 Hyperlipidemia, unspecified: Secondary | ICD-10-CM | POA: Diagnosis not present

## 2022-06-18 DIAGNOSIS — Z9181 History of falling: Secondary | ICD-10-CM

## 2022-06-18 DIAGNOSIS — D0361 Melanoma in situ of right upper limb, including shoulder: Secondary | ICD-10-CM

## 2022-06-18 DIAGNOSIS — K5730 Diverticulosis of large intestine without perforation or abscess without bleeding: Secondary | ICD-10-CM

## 2022-06-18 DIAGNOSIS — R5383 Other fatigue: Secondary | ICD-10-CM

## 2022-06-18 LAB — CBC WITH DIFFERENTIAL/PLATELET
Eosinophils Absolute: 78 cells/uL (ref 15–500)
Hemoglobin: 12 g/dL (ref 11.7–15.5)
MPV: 11 fL (ref 7.5–12.5)
RBC: 3.72 10*6/uL — ABNORMAL LOW (ref 3.80–5.10)
WBC: 5.2 10*3/uL (ref 3.8–10.8)

## 2022-06-18 NOTE — Progress Notes (Signed)
CPE  Assessment and Plan:  Encounter for general adult medical examination with abnormal findings Due Annually  Health maintenance reviewed   Essential hypertension Controlled  Discussed DASH (Dietary Approaches to Stop Hypertension) DASH diet is lower in sodium than a typical American diet. Cut back on foods that are high in saturated fat, cholesterol, and trans fats. Eat more whole-grain foods, fish, poultry, and nuts Remain active and exercise as tolerated daily.  Monitor BP at home-Call if greater than 130/80.   Atherosclerosis of aorta (HCC) Discussed lifestyle modifications. Recommended diet heavy in fruits and veggies, omega 3's. Decrease consumption of animal meats, cheeses, and dairy products. Remain active and exercise as tolerated. Continue to monitor. Check lipids/TSH  Senile purpura [facility]) Age related; reassured patient;  Protect skin, wear sunscreen, hats.  External hemorrhoids OTC Preparation H, witch hazel, stool softener. Discussed Hydrocortisone suppository, declines at this time. Stay well hydrated. RTC if s/s worsen or fail to improve. Continue to monitor.   Melanoma in situ of upper arm, right (HCC) Yearly skin checks with Dermatology.   Continue to monitor.   Unilateral primary osteoarthritis, right knee Continue follow up ortho. Continue topical NSAID. RICE method PRN. Declined surgery, managing fairly. Will continue to monitor.   Hyperlipidemia, unspecified hyperlipidemia type Controlled Continue supplements. Discussed lifestyle modifications. Recommended diet heavy in fruits and veggies, omega 3's. Decrease consumption of animal meats, cheeses, and dairy products. Remain active and exercise as tolerated. Continue to monitor.   Abnormal glucose Education: Reviewed 'ABCs' of diabetes management  Discussed goals to be met and/or maintained include A1C (<7) Blood pressure (<130/80) Cholesterol (LDL <70) Continue Eye Exam yearly   Continue Dental Exam Q6 mo Discussed dietary recommendations Discussed Physical Activity recommendations Check A1C  Vitamin D deficiency Continue supplement for goal of 60-100 Monitor Vitamin D levels  History of right breast cancer Oncology follows Continue screenings.    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 Denies concerns, flares.  Hx of Invasive ductal carcinoma of breast, female, left [facility]) Oncology follows - mammogram due - ordered Continue screenings.    Sigmoid diverticulosis Controlled. Discussed daily stool softener, PRN High fiber diet. Stay well hydrated.  Decrease caffeine. Increase activity level  At high risk for falls No recent falls. Move slowly. Contact guard, walker, cane. Stay well hydrated.  Other fatigue Adequate sleep. Increase fruits, veggies.  Screening for cardiovascular condition  - EKG 12-Lead  Screening for hematuria or proteinuria  - Urinalysis, Routine w reflex microscopic  Medication management All medications discussed and reviewed in full. All questions and concerns regarding medications addressed.     Orders Placed This Encounter  Procedures   MM Digital Screening    Standing Status:   Future    Standing Expiration Date:   06/18/2023    Order Specific Question:   Reason for Exam (SYMPTOM  OR DIAGNOSIS REQUIRED)    Answer:   Routine screening hx of breast cancer    Order Specific Question:   Preferred imaging location?    Answer:   [facility]   CBC with Differential/Platelet   COMPLETE METABOLIC PANEL WITH GFR   Magnesium   Lipid panel   TSH   Hemoglobin A1c   Insulin, random   VITAMIN D 25 Hydroxy (Vit-D Deficiency, Fractures)   Urinalysis, Routine w reflex microscopic   Microalbumin / creatinine urine ratio   EKG 12-Lead     Notify office for further evaluation and treatment, questions or  concerns if any reported s/s fail to  improve.   The patient was advised to call back or seek an in-person evaluation if any symptoms worsen or if the condition fails to improve as anticipated.   Further disposition pending results of labs. Discussed med's effects and SE's.    I discussed the assessment and treatment plan with the patient. The patient was provided an opportunity to ask questions and all were answered. The patient agreed with the plan and demonstrated an understanding of the instructions.  Discussed med's effects and SE's. Screening labs and tests as requested with regular follow-up as recommended.  I provided 35 minutes of face-to-face time during this encounter including counseling, chart review, and critical decision making was preformed.  Today's Plan of Care is based on a patient-centered health care approach known as shared decision making - the decisions, tests and treatments allow for patient preferences and values to be balanced with clinical evidence.     Future Appointments  Date Time Provider Department Center  06/26/2022  2:45 PM Kathryne Hitch, MD [facility] None  10/17/2022  3:30 PM Lucky Cowboy, MD [facility] None  06/19/2023  2:00 PM Adela Glimpse, NP [facility] None      Plan:    HPI  89 y.o. female accompanied by DIL presents for CPE. She has Hyperlipidemia; Abnormal glucose; Vitamin D deficiency; Encounter for long-term (current) use of medications; History of right breast cancer; Pancreatic mass; Essential hypertension; Invasive ductal carcinoma of breast, female, left (HCC); Malignant neoplasm of upper-outer quadrant of left breast in female, estrogen receptor positive (HCC); Melanoma in situ of upper arm, right (HCC); At high risk for falls; Atherosclerosis of aorta (HCC); Unilateral primary osteoarthritis, right knee; Senile purpura (HCC); Sigmoid diverticulosis; and External hemorrhoids on their problem list.  Overall she reports feeling well today.  She has no new concerns at  this time.  She is widowed, 4 sons, 10 grandchildren, 9 grand kids.   She is still driving, she has some decreased hearing, she does her medications and does her bills. She lives alone. Family checks on her regularly.   She continues to complain of bilateral knee pain.  R>L.  She manages by utilizing topical creams (Voltaren),  Tylenol 3 x 500 mg tabs most days, resting, using cane.  She declines surgery.  Continues to follow with Ortho, [facility] PRN. Denies any recent injury, fall.    Has occasional hemorrhoid flare.   She reports some slow BM movements, takes metamucil daily, eating prunes, Activia yogurt daily.  Started Linzess which is effective.  Stool softener PRN.  Tries to remain active.  She has graduated from [facility] for history of right breast cancer. 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  Stage IA., was briefly on anastrozole but couldn't tolerate, off now and still following with Dr. Arlice Colt. Last [facility]  07/2020.  Hx of melanoma of R arm s/p excision in 2014, follows with Dr. Barbaraann Cao annually.   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 Patient reports did shared decision making with GI/Dr. Oneta Rack and opted not to pursue.   BMI is Body mass index is 20.19 kg/m., she has been working on diet and exercise, she does  video program for stretching daily.  Wt Readings from Last 3 Encounters:  06/18/22 112 lb 3.2 oz (50.9 kg)  02/28/22 108 lb 12.8 oz (49.4 kg)  01/22/22 110 lb (49.9 kg)   She is not recently on any BP medications. today their BP is BP: 128/62.  She does workout. She denies chest pain, shortness of breath, dizziness.   She has aortic  atherosclerosis per CT 07/2016.   She is not on cholesterol medication secondary to age and benign personal hx. Her cholesterol is not at goal. The cholesterol last visit was:  Lab Results  Component Value Date   CHOL 207 (H) 01/02/2022   HDL 70 01/02/2022   LDLCALC 116 (H) 01/02/2022   TRIG 100 01/02/2022   CHOLHDL 3.0 01/02/2022  . She has been working on diet and exercise for glucose management, she is not on bASA, she is not on ACE/ARB and denies foot ulcerations, hyperglycemia, hypoglycemia , increased appetite, nausea, paresthesia of the feet, polydipsia, polyuria, visual disturbances, vomiting and weight loss. Last A1C in the office was:  Lab Results  Component Value Date   HGBA1C 5.7 (H) 01/02/2022    Last GFR:  Lab Results  Component Value Date   [facility] 82 06/14/2020   Patient is on Vitamin D supplement.   Lab Results  Component Value Date   VD25OH 73 10/03/2021       Current Medications:      Current Outpatient Medications (Analgesics):    acetaminophen (TYLENOL) 325 MG tablet, Take 500 mg by mouth in the morning, at noon, and at bedtime.   Current Outpatient Medications (Other):    calcium carbonate (OS-CAL) 600 MG TABS, Take 600 mg by mouth 2 (two) times daily with a meal.    Cholecalciferol (VITAMIN D) 1000 UNITS capsule, Take 1,000 Units by mouth 2 (two) times daily.    famotidine (PEPCID) 20 MG tablet, TAKE 1 TABLET BY MOUTH EVERY NIGHT AT BEDTIME   hydrocortisone (ANUSOL-HC) 2.5 % rectal cream, Place 1 application rectally 2 (two) times daily.   LINZESS 72 MCG capsule, TAKE 1 CAPSULE BY MOUTH DAILY BEFORE BREAKFAST   LYSINE HCL PO, Take 500 mg by mouth daily.    Multiple Vitamins-Minerals (MULTIVITAMIN PO), Take by mouth daily.   Omega-3 Fatty Acids (FISH OIL PO), Take by mouth daily. Mega Red  Health Maintenance:   Immunization History  Administered Date(s) Administered   Influenza, High Dose Seasonal PF 11/08/2013, 10/04/2014, 11/13/2015,  10/31/2016, 11/12/2017, 10/06/2018, 10/18/2021, 10/27/2021   Influenza-Unspecified 11/10/2012, 10/04/2014, 10/13/2020   PFIZER(Purple Top)SARS-COV-2 Vaccination 02/05/2019, 02/26/2019, 12/23/2019   Pneumococcal Conjugate-13 07/27/2013   Pneumococcal-Unspecified 11/30/2010   Respiratory Syncytial Virus Vaccine,Recomb Aduvanted(Arexvy) 01/27/2022   Td 02/12/2009   Zoster Recombinat (Shingrix) 09/17/2018, 11/17/2018   Zoster, Live 02/12/2006    Tetanus: 2011 - get PRN, cost/age Pneumovax: 2012 Prevnar:  2015 Flu vaccine: 2023 - at pharmacy  Shingrix- 2/2, 2020 Pfizer 2/2, unsure if has had booster - send information through mychart  PAP 2014 declines further due to age MGM: 06/2019, has scheduled in July 2022 - Due  DEXA: 2011 normal, refuses further  Colonoscopy: 2004 declines further due to age  Last Eye Exam:  Dr. Charlotte Sanes, Dec 2023 Last dental: New bridge placed, last 2023 Last derm: Follows annually, last 2023   Patient Care Team: Lucky Cowboy, MD as PCP - General (Internal Medicine) Iva Boop, MD as Consulting Physician (Gastroenterology) Luetta Nutting, MD as Referring Physician (Orthopedic Surgery) Venancio Poisson, MD as Consulting Physician (Dermatology)  Magrinat, Valentino Hue, MD (Inactive) as Consulting Physician (Oncology) Mathews Robinsons, MD as Consulting Physician (Dermatology) Abigail Miyamoto, MD as Consulting Physician (General Surgery)  Medical History:  Past Medical History:  Diagnosis Date   Arthritis    knees   Breast cancer [facility]) 2019   Left Breast   Breast cancer [facility]) 2012   Right Breast   Cancer [facility]) 2012   right breast cancer   Hyperlipidemia    Melanoma (HCC) 2014   right humerus   Personal history of radiation therapy 2012   Right Breast Cancer   Personal history of radiation therapy 2019   Left Breast Cancer   Prediabetes    Vitamin D deficiency    Allergies Allergies  Allergen Reactions   Anastrozole    Tramadol      Medication caused dizziness    SURGICAL HISTORY She  has a past surgical history that includes Cataract extraction (Bilateral, 2009); Cesarean section; Melanoma excision (Right, 2014); Breast biopsy (Right, 05/28/2010); Mastectomy, partial (Left, 07/29/2017); Breast lumpectomy (Right, 06/29/2010); and Breast lumpectomy (Left, 08/08/2017). FAMILY HISTORY Her family history includes Arthritis in her mother; Cancer in her sister; Diabetes in her maternal grandmother; Heart attack in her brother; Hypertension in her sister; Rheum arthritis in her mother. SOCIAL HISTORY She  reports that she quit smoking about 45 years ago. Her smoking use included cigarettes. She has never used smokeless tobacco. She reports current alcohol use. She reports that she does not use drugs.    Review of Systems: Review of Systems  Constitutional:  Negative for chills, fever, malaise/fatigue and weight loss.  HENT:  Negative for congestion, ear pain, hearing loss, sore throat and tinnitus.   Eyes: Negative.  Negative for blurred vision and double vision.  Respiratory:  Negative for cough, shortness of breath and wheezing.   Cardiovascular:  Negative for chest pain, palpitations, orthopnea, claudication and leg swelling.  Gastrointestinal:  Positive for constipation (Now every other day). Negative for abdominal pain, blood in stool, diarrhea, heartburn, melena, nausea and vomiting.  Genitourinary: Negative.   Musculoskeletal:  Positive for joint pain (R knee, chronic). Negative for back pain, falls, myalgias and neck pain.  Skin: Negative.  Negative for rash.  Neurological:  Negative for dizziness, tingling, sensory change, loss of consciousness, weakness and headaches.  Endo/Heme/Allergies:  Negative for polydipsia.  Psychiatric/Behavioral: Negative.  Negative for depression. The patient is not nervous/anxious and does not have insomnia.   All other systems reviewed and are negative.   Physical Exam: Estimated body  mass index is 20.19 kg/m as calculated from the following:   Height as of this encounter: 5' 2.5" (1.588 m).   Weight as of this encounter: 112 lb 3.2 oz (50.9 kg). BP 128/62   Pulse 73   Temp 97.8 F (36.6 C)   Ht 5' 2.5" (1.588 m)   Wt 112 lb 3.2 oz (50.9 kg)   SpO2 99%   BMI 20.19 kg/m   General Appearance: Well nourished well developed, elderly female in no apparent distress.  Eyes: PERRLA, EOMs, conjunctiva no swelling or erythema ENT/Mouth: Ear canals bil obstruction with dry wax, non-tender auricle and tragus.  TMs no visualized.  Oropharynx moist and clear with no exudate, erythema, or swelling.   Neck: Supple, thyroid normal. No bruits.  No cervical adenopathy  Respiratory: Respiratory effort normal, Breath sounds clear A&P without wheeze, rhonchi, rales.   Cardio: RRR without murmurs, rubs or gallops. Brisk peripheral pulses without edema.  Chest: symmetric, with normal excursions Breasts: defer  Abdomen: Soft, nontender, no guarding, rebound, hernias, masses, or organomegaly.  Lymphatics: Non tender without lymphadenopathy.  Genitourinary: defer Musculoskeletal: Full ROM all peripheral extremities, no effusion, 5/5 strength, and antalgic gait with 4 point cane Skin: Warm, dry without rashes, ecchymosis. Scattered brown nevi with variation - no immediately concerning lesions, derm follows for total body check annually Neuro: Awake and oriented X 3, Cranial nerves intact, reflexes equal bilaterally. Normal muscle tone, no cerebellar symptoms. Sensation intact.  Psych:  normal affect, Insight and Judgment appropriate.   EKG: NSR  Victoria Long 4:57 PM [facility]

## 2022-06-18 NOTE — Patient Instructions (Signed)

## 2022-06-19 ENCOUNTER — Telehealth: Payer: Self-pay

## 2022-06-19 LAB — CBC WITH DIFFERENTIAL/PLATELET
Absolute Monocytes: 489 cells/uL (ref 200–950)
Basophils Relative: 0.6 %
MCHC: 32.6 g/dL (ref 32.0–36.0)
Monocytes Relative: 9.4 %
Platelets: 232 10*3/uL (ref 140–400)

## 2022-06-19 LAB — COMPLETE METABOLIC PANEL WITH GFR
AG Ratio: 2 (calc) (ref 1.0–2.5)
ALT: 14 U/L (ref 6–29)
BUN: 24 mg/dL (ref 7–25)
Calcium: 10.3 mg/dL (ref 8.6–10.4)
Chloride: 106 mmol/L (ref 98–110)
Globulin: 2.2 g/dL (calc) (ref 1.9–3.7)
Sodium: 143 mmol/L (ref 135–146)

## 2022-06-19 LAB — URINALYSIS, ROUTINE W REFLEX MICROSCOPIC
Protein, ur: NEGATIVE
Specific Gravity, Urine: 1.016 (ref 1.001–1.035)

## 2022-06-19 LAB — TSH: TSH: 1.3 mIU/L (ref 0.40–4.50)

## 2022-06-19 LAB — LIPID PANEL
HDL: 71 mg/dL (ref >=50)
Triglycerides: 118 mg/dL (ref <150)

## 2022-06-19 NOTE — Progress Notes (Signed)
Patient is aware of lab results and instructions., -e welch

## 2022-06-19 NOTE — Telephone Encounter (Signed)
Patient states that she would like a list of foods to avoid to help with constipation.

## 2022-06-20 LAB — LIPID PANEL
Cholesterol: 217 mg/dL — ABNORMAL HIGH (ref <200)
LDL Cholesterol (Calc): 123 mg/dL (calc) — ABNORMAL HIGH
Non-HDL Cholesterol (Calc): 146 mg/dL (calc) — ABNORMAL HIGH (ref <130)
Total CHOL/HDL Ratio: 3.1 (calc) (ref <5.0)

## 2022-06-20 LAB — COMPLETE METABOLIC PANEL WITH GFR
AST: 17 U/L (ref 10–35)
Albumin: 4.3 g/dL (ref 3.6–5.1)
Alkaline phosphatase (APISO): 50 U/L (ref 37–153)
CO2: 27 mmol/L (ref 20–32)
Creat: 0.76 mg/dL (ref 0.60–0.95)
Glucose, Bld: 87 mg/dL (ref 65–99)
Potassium: 4.5 mmol/L (ref 3.5–5.3)
Total Bilirubin: 0.5 mg/dL (ref 0.2–1.2)
Total Protein: 6.5 g/dL (ref 6.1–8.1)
eGFR: 73 mL/min/{1.73_m2} (ref >=60)

## 2022-06-20 LAB — URINALYSIS, ROUTINE W REFLEX MICROSCOPIC
Bilirubin Urine: NEGATIVE
Glucose, UA: NEGATIVE
Hgb urine dipstick: NEGATIVE
Ketones, ur: NEGATIVE
Leukocytes,Ua: NEGATIVE
Nitrite: NEGATIVE
pH: 7 (ref 5.0–8.0)

## 2022-06-20 LAB — MAGNESIUM: Magnesium: 2.2 mg/dL (ref 1.5–2.5)

## 2022-06-20 LAB — CBC WITH DIFFERENTIAL/PLATELET
Basophils Absolute: 31 cells/uL (ref 0–200)
Eosinophils Relative: 1.5 %
HCT: 36.8 % (ref 35.0–45.0)
Lymphs Abs: 1472 cells/uL (ref 850–3900)
MCH: 32.3 pg (ref 27.0–33.0)
MCV: 98.9 fL (ref 80.0–100.0)
Neutro Abs: 3130 cells/uL (ref 1500–7800)
Neutrophils Relative %: 60.2 %
RDW: 11.7 % (ref 11.0–15.0)
Total Lymphocyte: 28.3 %

## 2022-06-20 LAB — MICROALBUMIN / CREATININE URINE RATIO
Creatinine, Urine: 67 mg/dL (ref 20–275)
Microalb Creat Ratio: 15 mg/g creat (ref <30)
Microalb, Ur: 1 mg/dL

## 2022-06-20 LAB — HEMOGLOBIN A1C
Hgb A1c MFr Bld: 5.7 % of total Hgb — ABNORMAL HIGH (ref <5.7)
Mean Plasma Glucose: 117 mg/dL
eAG (mmol/L): 6.5 mmol/L

## 2022-06-20 LAB — INSULIN, RANDOM: Insulin: 3.2 u[IU]/mL

## 2022-06-20 LAB — VITAMIN D 25 HYDROXY (VIT D DEFICIENCY, FRACTURES): Vit D, 25-Hydroxy: 86 ng/mL (ref 30–100)

## 2022-06-26 ENCOUNTER — Encounter: Payer: Self-pay | Admitting: Orthopaedic Surgery

## 2022-06-26 ENCOUNTER — Ambulatory Visit: Payer: Medicare PPO | Admitting: Orthopaedic Surgery

## 2022-06-26 DIAGNOSIS — M1711 Unilateral primary osteoarthritis, right knee: Secondary | ICD-10-CM

## 2022-06-26 MED ORDER — HYALURONAN 88 MG/4ML IX SOSY
88.0000 mg | PREFILLED_SYRINGE | INTRA_ARTICULAR | Status: AC | PRN
Start: 2022-06-26 — End: 2022-06-26
  Administered 2022-06-26: 88 mg via INTRA_ARTICULAR

## 2022-06-26 NOTE — Progress Notes (Signed)
   Procedure Note  Patient: Victoria Long             Date of Birth: 12-09-1928           MRN: [id number]             Visit Date: 06/26/2022  Procedures: Visit Diagnoses:  1. Unilateral primary osteoarthritis, right knee     Large Joint Inj: R knee on 06/26/2022 3:00 PM Indications: diagnostic evaluation and pain Details: 22 G 1.5 in needle, superolateral approach  Arthrogram: No  Medications: 88 mg Hyaluronan 88 MG/4ML Outcome: tolerated well, no immediate complications Procedure, treatment alternatives, risks and benefits explained, specific risks discussed. Consent was given by the patient. Immediately prior to procedure a time out was called to verify the correct patient, procedure, equipment, support staff and site/side marked as required. Patient was prepped and draped in the usual sterile fashion.    The patient is a 89 year old who comes in today for hyaluronic acid injection with Monovisc to treat the pain from osteoarthritis in her right knee.  She does ambulate with a cane.  Her daughter is with her today.  On exam she has significant varus malalignment of the knee.  I did place Monovisc in her knee without difficulty today.  This is 1 treatment modality and we will see if this will help her the only other options are considering knee replacement surgery and they understand that as well.

## 2022-07-10 ENCOUNTER — Other Ambulatory Visit: Payer: Self-pay | Admitting: Nurse Practitioner

## 2022-07-10 DIAGNOSIS — K5901 Slow transit constipation: Secondary | ICD-10-CM

## 2022-07-10 DIAGNOSIS — Z79899 Other long term (current) drug therapy: Secondary | ICD-10-CM

## 2022-08-09 ENCOUNTER — Other Ambulatory Visit: Payer: Self-pay | Admitting: Nurse Practitioner

## 2022-08-09 DIAGNOSIS — Z79899 Other long term (current) drug therapy: Secondary | ICD-10-CM

## 2022-08-09 DIAGNOSIS — K5901 Slow transit constipation: Secondary | ICD-10-CM

## 2022-08-12 ENCOUNTER — Other Ambulatory Visit: Payer: Self-pay | Admitting: Nurse Practitioner

## 2022-08-12 ENCOUNTER — Telehealth: Payer: Self-pay | Admitting: Nurse Practitioner

## 2022-08-12 DIAGNOSIS — K2100 Gastro-esophageal reflux disease with esophagitis, without bleeding: Secondary | ICD-10-CM

## 2022-08-12 NOTE — Telephone Encounter (Signed)
Pt wanted to confirm that she should be taking Linzess 72 MCG capsules? Thanks

## 2022-08-13 NOTE — Telephone Encounter (Signed)
LVM to call back.

## 2022-09-07 ENCOUNTER — Other Ambulatory Visit: Payer: Self-pay | Admitting: Nurse Practitioner

## 2022-09-07 DIAGNOSIS — K5901 Slow transit constipation: Secondary | ICD-10-CM

## 2022-09-07 DIAGNOSIS — Z79899 Other long term (current) drug therapy: Secondary | ICD-10-CM

## 2022-10-08 ENCOUNTER — Other Ambulatory Visit: Payer: Self-pay | Admitting: Nurse Practitioner

## 2022-10-08 DIAGNOSIS — K2100 Gastro-esophageal reflux disease with esophagitis, without bleeding: Secondary | ICD-10-CM

## 2022-10-17 ENCOUNTER — Ambulatory Visit (INDEPENDENT_AMBULATORY_CARE_PROVIDER_SITE_OTHER): Payer: Medicare PPO | Admitting: Internal Medicine

## 2022-10-17 ENCOUNTER — Encounter: Payer: Self-pay | Admitting: Internal Medicine

## 2022-10-17 VITALS — BP 134/68 | HR 74 | Temp 97.9°F | Resp 16 | Ht 62.5 in | Wt 109.2 lb

## 2022-10-17 DIAGNOSIS — R7309 Other abnormal glucose: Secondary | ICD-10-CM | POA: Diagnosis not present

## 2022-10-17 DIAGNOSIS — H6120 Impacted cerumen, unspecified ear: Secondary | ICD-10-CM | POA: Diagnosis not present

## 2022-10-17 DIAGNOSIS — H6123 Impacted cerumen, bilateral: Secondary | ICD-10-CM

## 2022-10-17 DIAGNOSIS — Z79899 Other long term (current) drug therapy: Secondary | ICD-10-CM

## 2022-10-17 DIAGNOSIS — E782 Mixed hyperlipidemia: Secondary | ICD-10-CM | POA: Diagnosis not present

## 2022-10-17 DIAGNOSIS — R0989 Other specified symptoms and signs involving the circulatory and respiratory systems: Secondary | ICD-10-CM | POA: Diagnosis not present

## 2022-10-17 DIAGNOSIS — I700 Atherosclerosis of aorta: Secondary | ICD-10-CM

## 2022-10-17 DIAGNOSIS — E559 Vitamin D deficiency, unspecified: Secondary | ICD-10-CM

## 2022-10-17 DIAGNOSIS — Z23 Encounter for immunization: Secondary | ICD-10-CM

## 2022-10-17 LAB — CBC WITH DIFFERENTIAL/PLATELET
Absolute Monocytes: 589 cells/uL (ref 200–950)
Basophils Absolute: 37 cells/uL (ref 0–200)
Basophils Relative: 0.6 %
Eosinophils Absolute: 99 cells/uL (ref 15–500)
Eosinophils Relative: 1.6 %
HCT: 35.5 % (ref 35.0–45.0)
Hemoglobin: 11.6 g/dL — ABNORMAL LOW (ref 11.7–15.5)
Lymphs Abs: 1810 cells/uL (ref 850–3900)
MCH: 33.1 pg — ABNORMAL HIGH (ref 27.0–33.0)
MCHC: 32.7 g/dL (ref 32.0–36.0)
MCV: 101.4 fL — ABNORMAL HIGH (ref 80.0–100.0)
MPV: 10.9 fL (ref 7.5–12.5)
Monocytes Relative: 9.5 %
Neutro Abs: 3664 cells/uL (ref 1500–7800)
Neutrophils Relative %: 59.1 %
Platelets: 235 10*3/uL (ref 140–400)
RBC: 3.5 10*6/uL — ABNORMAL LOW (ref 3.80–5.10)
RDW: 11.4 % (ref 11.0–15.0)
Total Lymphocyte: 29.2 %
WBC: 6.2 10*3/uL (ref 3.8–10.8)

## 2022-10-17 NOTE — Progress Notes (Signed)
Future Appointments  Date Time Provider Department  10/17/2022                     3 mo ov   3:30 PM Lucky Cowboy, MD [facility]  06/19/2023                     cpe  2:00 PM Adela Glimpse, NP [facility]    History of Present Illness:       This very nice 89 y.o. WWF presents for 3 month follow up with HTN, HLD, Pre-Diabetes and Vitamin D Deficiency.  Patient also c/o "both ears stopped up".       Patient is followed expectantly  for labile HTN  & BP has been controlled at home. Today's BP is at goal -  134/68 . Patient has had no complaints of any cardiac type chest pain, palpitations, dyspnea Pollyann Kennedy /PND, dizziness, claudication or dependent edema.       Hyperlipidemia is not controlled with diet & is not treated aggressively due to age.  Patient denies myalgias or other med SE's. Last Lipids were not at goal :  Lab Results  Component Value Date   CHOL 232 (H) 06/14/2021   HDL 81 06/14/2021   LDLCALC 129 (H) 06/14/2021   TRIG 110 06/14/2021   CHOLHDL 2.9 06/14/2021     Also, the patient has history of PreDiabetes  (A1c 5.8% / 2014) and has had no symptoms of reactive hypoglycemia, diabetic polys, paresthesias or visual blurring.  Last A1c was normal & at goal :  Lab Results  Component Value Date   HGBA1C 5.5 06/14/2021                                                           Further, the patient also has history of Vitamin D Deficiency and supplements vitamin D . Last vitamin D was at goal :  Lab Results  Component Value Date   VD25OH 81 06/14/2021     Current Outpatient Medications on File Prior to Visit  Medication Sig   acetaminophen (TYLENOL) 325 MG tablet Take 500 mg by mouth in the morning, at noon, and at bedtime.   calcium carbonate (OS-CAL) 600 MG TABS Take 600 mg by mouth 2 (two) times daily with a meal.    Cholecalciferol (VITAMIN D) 1000 UNITS capsule Take 1,000 Units by mouth 2 (two) times daily.    hydrocortisone (ANUSOL-HC) 2.5 % rectal  cream Place 1 application rectally 2 (two) times daily.   LYSINE HCL 500 mg Take daily.    Multiple Vitamins-Minerals  Take by mouth daily.   Omega-3 FISH OIL  Take by mouth daily. Mega Red     Allergies  Allergen Reactions   Anastrozole    Tramadol     Medication caused dizziness    PMHx:   Past Medical History:  Diagnosis Date   Arthritis    knees   Breast cancer (HCC) 2019   Left Breast   Breast cancer (HCC) 2012   Right Breast   Cancer [facility]) 2012   right breast cancer   Hyperlipidemia    Melanoma (HCC) 2014   right humerus   Personal history of radiation therapy 2012   Right Breast Cancer   Personal history of  radiation therapy 2019   Left Breast Cancer   Prediabetes    Vitamin D deficiency      Immunization History  Administered Date(s) Administered   Influenza, High Dose Seasonal PF 10/31/2016, 11/12/2017, 10/06/2018   Influenza-Unspecified 11/10/2012, 10/04/2014, 10/13/2020   PFIZER-SARS-COV-2 Vacc 02/05/2019, 02/26/2019, 12/23/2019   Pneumococcal - 13 07/27/2013   Pneumococcal - 23 11/30/2010   Td 02/12/2009   Zoster Recombinat (Shingrix) 09/17/2018, 11/17/2018   Zoster, Live 02/12/2006     Past Surgical History:  Procedure Laterality Date   BREAST BIOPSY Right 05/28/2010   BREAST LUMPECTOMY Right 06/29/2010   with radiation no chemo   BREAST LUMPECTOMY Left 08/08/2017   CATARACT EXTRACTION Bilateral 2009   CESAREAN SECTION     MASTECTOMY, PARTIAL Left 07/29/2017   Procedure: LEFT BREAST PARTIAL MASTECTOMY;  Surgeon: Abigail Miyamoto, MD;  Location: [facility];  Service: General;  Laterality: Left;   MELANOMA EXCISION Right 2014   humerus    FHx:    Reviewed / unchanged  SHx:    Reviewed / unchanged   Systems Review:  Constitutional: Denies fever, chills, wt changes, headaches, insomnia, fatigue, night sweats, change in appetite. Eyes: Denies redness, blurred vision, diplopia, discharge, itchy, watery eyes.  ENT: Denies  discharge, congestion, post nasal drip, epistaxis, sore throat, earache, hearing loss, dental pain, tinnitus, vertigo, sinus pain, snoring.  CV: Denies chest pain, palpitations, irregular heartbeat, syncope, dyspnea, diaphoresis, orthopnea, PND, claudication or edema. Respiratory: denies cough, dyspnea, DOE, pleurisy, hoarseness, laryngitis, wheezing.  Gastrointestinal: Denies dysphagia, odynophagia, heartburn, reflux, water brash, abdominal pain or cramps, nausea, vomiting, bloating, diarrhea, constipation, hematemesis, melena, hematochezia  or hemorrhoids. Genitourinary: Denies dysuria, frequency, urgency, nocturia, hesitancy, discharge, hematuria or flank pain. Musculoskeletal: Denies arthralgias, myalgias, stiffness, jt. swelling, pain, limping or strain/sprain.  Skin: Denies pruritus, rash, hives, warts, acne, eczema or change in skin lesion(s). Neuro: No weakness, tremor, incoordination, spasms, paresthesia or pain. Psychiatric: Denies confusion, memory loss or sensory loss. Endo: Denies change in weight, skin or hair change.  Heme/Lymph: No excessive bleeding, bruising or enlarged lymph nodes.  Physical Exam  BP 134/68   Pulse 74   Temp 97.9 F (36.6 C)   Resp 16   Ht 5' 2.5" (1.588 m)   Wt 109 lb 3.2 oz (49.5 kg)   SpO2 94%   BMI 19.65 kg/m   Appears  well nourished, well groomed  and in no distress.  Eyes: PERRLA, EOMs, conjunctiva no swelling or erythema. Sinuses: No frontal/maxillary tenderness ENT/Mouth: EAC's impacted bilaterally with Dry flaky cerumen & TMs not visualized.  Nares clear w/o erythema, swelling, exudates. Oropharynx clear without erythema or exudates. Oral hygiene is good. Tongue normal, non obstructing. Hearing intact.  Neck: Supple. Thyroid not palpable. Car 2+/2+ without bruits, nodes or JVD. Chest: Respirations nl with BS clear & equal w/o rales, rhonchi, wheezing or stridor.  Cor: Heart sounds normal w/ regular rate and rhythm without sig. murmurs,  gallops, clicks or rubs. Peripheral pulses normal and equal  without edema.  Abdomen: Soft & bowel sounds normal. Non-tender w/o guarding, rebound, hernias, masses or organomegaly.  Lymphatics: Unremarkable.  Musculoskeletal: Full ROM all peripheral extremities, joint stability, 5/5 strength and normal gait.  Skin: Warm, dry without exposed rashes, lesions or ecchymosis apparent.  Neuro: Cranial nerves intact, reflexes equal bilaterally. Sensory-motor testing grossly intact. Tendon reflexes grossly intact.  Pysch: Alert & oriented x 3.  Insight and judgement nl & appropriate. No ideations.  Procedure   (  CPT  -  78295  )       After informed consent, Bilateral EAC's irrigated until clear & post procedure, TM's appeared normal .   Assessment and Plan:  1. Labile hypertension  - CBC with Differential/Platelet - COMPLETE METABOLIC PANEL WITH GFR - Magnesium - TSH  2. Hyperlipidemia, mixed  - Lipid panel - TSH  3. Abnormal glucose  - Hemoglobin A1c - Insulin, random  4. Vitamin D deficiency  - VITAMIN D 25 Hydroxy   5. Bilateral hearing loss due to cerumen impaction   6. Atherosclerosis of aorta (HCC)  - Lipid panel  7. Need for immunization against influenza  - Flu vaccine HIGH DOSE PF (Fluzone High dose)  8. Medication management  - CBC with Differential/Platelet - COMPLETE METABOLIC PANEL WITH GFR - Magnesium - Lipid panel - TSH - Hemoglobin A1c - Insulin, random - VITAMIN D 25 Hydroxy          Discussed  regular exercise, BP monitoring, weight control to achieve/maintain BMI less than 25 and discussed med and SE's. Recommended labs to assess /monitor clinical status .  I discussed the assessment and treatment plan with the patient. The patient was provided an opportunity to ask questions and all were answered. The patient agreed with the plan and demonstrated an understanding of the instructions.  I provided over 30 minutes of exam, counseling, chart review  and  complex critical decision making.        The patient was advised to call back or seek an in-person evaluation if the symptoms worsen or if the condition fails to improve as anticipated.   Marinus Maw, MD

## 2022-10-17 NOTE — Patient Instructions (Signed)
Due to recent changes in healthcare laws, you may see the results of your imaging and laboratory studies on MyChart before your provider has had a chance to review them.  We understand that in some cases there may be results that are confusing or concerning to you. Not all laboratory results come back in the same time frame and the provider may be waiting for multiple results in order to interpret others.  Please give [name] 48 hours in order for your provider to thoroughly review all the results before contacting the office for clarification of your results.  ++++++++++++++++++++++++++  Vit D  & Vit C 1,000 mg   are recommended to help protect  against the Covid-19 and other Corona viruses.    Also it's recommended  to take  Zinc 50 mg  to help  protect against the Covid-19   and best place to get  is also on [email or web address]  and don't pay more than 6-8 cents /pill !   +++++++++++++++++++++++++++++++++++++++ Recommend Adult Low Dose Aspirin or  coated  Aspirin 81 mg daily  To reduce risk of Colon Cancer 40 %,  Skin Cancer 26 % ,  Melanoma 46%  and  Pancreatic cancer 60% +++++++++++++++++++++++++++++++++++++++++ Vitamin D goal  is between 70-100.  Please make sure that you are taking your Vitamin D as directed.  It is very important as a natural anti-inflammatory  helping hair, skin, and nails, as well as reducing stroke and heart attack risk.  It helps your bones and helps with mood. It also decreases numerous cancer risks so please take it as directed.  Low Vit D is associated with a 200-300% higher risk for CANCER  and 200-300% higher risk for HEART   ATTACK  &  STROKE.   .....................................Marland Kitchen It is also associated with higher death rate at younger ages,  autoimmune diseases like Rheumatoid arthritis, Lupus, Multiple Sclerosis.    Also many other serious conditions, like depression, Alzheimer's Dementia, infertility, muscle aches, fatigue, fibromyalgia - just to name  a few. +++++++++++++++++++++++++++++++++++++++++ Recommend the book "The END of DIETING" by Dr Monico Hoar  & the book "The END of DIABETES " by Dr Monico Hoar At [email or web address] - get book & Audio CD's    Being diabetic has a  300% increased risk for heart attack, stroke, cancer, and alzheimer- type vascular dementia. It is very important that you work harder with diet by avoiding all foods that are white. Avoid white rice (brown & wild rice is OK), white potatoes (sweetpotatoes in moderation is OK), White bread or wheat bread or anything made out of white flour like bagels, donuts, rolls, buns, biscuits, cakes, pastries, cookies, pizza crust, and pasta (made from white flour & egg whites) - vegetarian pasta or spinach or wheat pasta is OK. Multigrain breads like Arnold's or Pepperidge Farm, or multigrain sandwich thins or flatbreads.  Diet, exercise and weight loss can reverse and cure diabetes in the early stages.  Diet, exercise and weight loss is very important in the control and prevention of complications of diabetes which affects every system in your body, ie. Brain - dementia/stroke, eyes - glaucoma/blindness, heart - heart attack/heart failure, kidneys - dialysis, stomach - gastric paralysis, intestines - malabsorption, nerves - severe painful neuritis, circulation - gangrene & loss of a leg(s), and finally cancer and Alzheimers.    I recommend avoid fried & greasy foods,  sweets/candy, white rice (brown or wild rice or Quinoa is OK), white potatoes (sweet potatoes are OK) - anything  made from white flour - bagels, doughnuts, rolls, buns, biscuits,white and wheat breads, pizza crust and traditional pasta made of white flour & egg white(vegetarian pasta or spinach or wheat pasta is OK).  Multi-grain bread is OK - like multi-grain flat bread or sandwich thins. Avoid alcohol in excess. Exercise is also important.    Eat all the vegetables you want - avoid meat, especially red meat and dairy - especially  cheese.  Cheese is the most concentrated form of trans-fats which is the worst thing to clog up our arteries. Veggie cheese is OK which can be found in the fresh produce section at [employer] or [employer] or [employer]  +++++++++++++++++++++++++++++++++++++++ DASH Eating Plan  DASH stands for "Dietary Approaches to Stop Hypertension."   The DASH eating plan is a healthy eating plan that has been shown to reduce high blood pressure (hypertension). Additional health benefits may include reducing the risk of type 2 diabetes mellitus, heart disease, and stroke. The DASH eating plan may also help with weight loss. WHAT DO I NEED TO KNOW ABOUT THE DASH EATING PLAN? For the DASH eating plan, you will follow these general guidelines: Choose foods with a percent daily value for sodium of less than 5% (as listed on the food label). Use salt-free seasonings or herbs instead of table salt or sea salt. Check with your health care provider or pharmacist before using salt substitutes. Eat lower-sodium products, often labeled as "lower sodium" or "no salt added." Eat fresh foods. Eat more vegetables, fruits, and low-fat dairy products. Choose whole grains. Look for the word "whole" as the first word in the ingredient list. Choose fish  Limit sweets, desserts, sugars, and sugary drinks. Choose heart-healthy fats. Eat veggie cheese  Eat more home-cooked food and less restaurant, buffet, and fast food. Limit fried foods. Cook foods using methods other than frying. Limit canned vegetables. If you do use them, rinse them well to decrease the sodium. When eating at a restaurant, ask that your food be prepared with less salt, or no salt if possible.                      WHAT FOODS CAN I EAT? Read Dr Francis Dowse Fuhrman's books on The End of Dieting & The End of Diabetes  Grains Whole grain or whole wheat bread. Brown rice. Whole grain or whole wheat pasta. Quinoa, bulgur, and whole grain cereals. Low-sodium  cereals. Corn or whole wheat flour tortillas. Whole grain cornbread. Whole grain crackers. Low-sodium crackers.  Vegetables Fresh or frozen vegetables (raw, steamed, roasted, or grilled). Low-sodium or reduced-sodium tomato and vegetable juices. Low-sodium or reduced-sodium tomato sauce and paste. Low-sodium or reduced-sodium canned vegetables.   Fruits All fresh, canned (in natural juice), or frozen fruits.  Protein Products  All fish and seafood.  Dried beans, peas, or lentils. Unsalted nuts and seeds. Unsalted canned beans.  Dairy Low-fat dairy products, such as skim or 1% milk, 2% or reduced-fat cheeses, low-fat ricotta or cottage cheese, or plain low-fat yogurt. Low-sodium or reduced-sodium cheeses.  Fats and Oils Tub margarines without trans fats. Light or reduced-fat mayonnaise and salad dressings (reduced sodium). Avocado. Safflower, olive, or canola oils. Natural peanut or almond butter.  Other Unsalted popcorn and pretzels. The items listed above may not be a complete list of recommended foods or beverages. Contact your dietitian for more options.  +++++++++++++++  WHAT FOODS ARE NOT RECOMMENDED? Grains/ White flour or wheat flour White bread. White pasta. White rice. Refined  cornbread. Bagels and croissants. Crackers that contain trans fat.  Vegetables  Creamed or fried vegetables. Vegetables in a . Regular canned vegetables. Regular canned tomato sauce and paste. Regular tomato and vegetable juices.  Fruits Dried fruits. Canned fruit in light or heavy syrup. Fruit juice.  Meat and Other Protein Products Meat in general - RED meat & White meat.  Fatty cuts of meat. Ribs, chicken wings, all processed meats as bacon, sausage, bologna, salami, fatback, hot dogs, bratwurst and packaged luncheon meats.  Dairy Whole or 2% milk, cream, half-and-half, and cream cheese. Whole-fat or sweetened yogurt. Full-fat cheeses or blue cheese. Non-dairy creamers and whipped toppings.  Processed cheese, cheese spreads, or cheese curds.  Condiments Onion and garlic salt, seasoned salt, table salt, and sea salt. Canned and packaged gravies. Worcestershire sauce. Tartar sauce. Barbecue sauce. Teriyaki sauce. Soy sauce, including reduced sodium. Steak sauce. Fish sauce. Oyster sauce. Cocktail sauce. Horseradish. Ketchup and mustard. Meat flavorings and tenderizers. Bouillon cubes. Hot sauce. Tabasco sauce. Marinades. Taco seasonings. Relishes.  Fats and Oils Butter, stick margarine, lard, shortening and bacon fat. Coconut, palm kernel, or palm oils. Regular salad dressings.  Pickles and olives. Salted popcorn and pretzels.  The items listed above may not be a complete list of foods and beverages to avoid.

## 2022-10-18 LAB — LIPID PANEL
Cholesterol: 217 mg/dL — ABNORMAL HIGH (ref <200)
HDL: 68 mg/dL (ref >=50)
LDL Cholesterol (Calc): 126 mg/dL — ABNORMAL HIGH
Non-HDL Cholesterol (Calc): 149 mg/dL — ABNORMAL HIGH (ref <130)
Total CHOL/HDL Ratio: 3.2 (calc) (ref <5.0)
Triglycerides: 119 mg/dL (ref <150)

## 2022-10-18 LAB — COMPLETE METABOLIC PANEL WITH GFR
AG Ratio: 1.9 (calc) (ref 1.0–2.5)
ALT: 13 U/L (ref 6–29)
AST: 18 U/L (ref 10–35)
Albumin: 4.3 g/dL (ref 3.6–5.1)
Alkaline phosphatase (APISO): 46 U/L (ref 37–153)
BUN: 18 mg/dL (ref 7–25)
CO2: 30 mmol/L (ref 20–32)
Calcium: 10.1 mg/dL (ref 8.6–10.4)
Chloride: 101 mmol/L (ref 98–110)
Creat: 0.84 mg/dL (ref 0.60–0.95)
Globulin: 2.3 g/dL (ref 1.9–3.7)
Glucose, Bld: 96 mg/dL (ref 65–99)
Potassium: 4.3 mmol/L (ref 3.5–5.3)
Sodium: 138 mmol/L (ref 135–146)
Total Bilirubin: 0.5 mg/dL (ref 0.2–1.2)
Total Protein: 6.6 g/dL (ref 6.1–8.1)
eGFR: 65 mL/min/{1.73_m2} (ref >=60)

## 2022-10-18 LAB — HEMOGLOBIN A1C
Hgb A1c MFr Bld: 5.8 %{Hb} — ABNORMAL HIGH (ref <5.7)
Mean Plasma Glucose: 120 mg/dL
eAG (mmol/L): 6.6 mmol/L

## 2022-10-18 LAB — MAGNESIUM: Magnesium: 2.2 mg/dL (ref 1.5–2.5)

## 2022-10-18 LAB — TSH: TSH: 1.29 m[IU]/L (ref 0.40–4.50)

## 2022-10-18 LAB — INSULIN, RANDOM: Insulin: 3 u[IU]/mL

## 2022-10-18 LAB — VITAMIN D 25 HYDROXY (VIT D DEFICIENCY, FRACTURES): Vit D, 25-Hydroxy: 87 ng/mL (ref 30–100)

## 2022-10-20 NOTE — Progress Notes (Signed)
<>*<>*<>*<>*<>*<>*<>*<>*<>*<>*<>*<>*<>*<>*<>*<>*<>*<>*<>*<>*<>*<>*<>*<>*<> <>*<>*<>*<>*<>*<>*<>*<>*<>*<>*<>*<>*<>*<>*<>*<>*<>*<>*<>*<>*<>*<>*<>*<>*<>  -Test results slightly outside the reference range are not unusual. If there is anything important, I will review this with you,  otherwise it is considered normal test values.  If you have further questions,  please do not hesitate to contact me at the office or via My Chart.   <>*<>*<>*<>*<>*<>*<>*<>*<>*<>*<>*<>*<>*<>*<>*<>*<>*<>*<>*<>*<>*<>*<>*<>*<> <>*<>*<>*<>*<>*<>*<>*<>*<>*<>*<>*<>*<>*<>*<>*<>*<>*<>*<>*<>*<>*<>*<>*<>*<>  - Mild anemia - Will continue to monitor closely.   <>*<>*<>*<>*<>*<>*<>*<>*<>*<>*<>*<>*<>*<>*<>*<>*<>*<>*<>*<>*<>*<>*<>*<>*<> <>*<>*<>*<>*<>*<>*<>*<>*<>*<>*<>*<>*<>*<>*<>*<>*<>*<>*<>*<>*<>*<>*<>*<>*<>   -  Total  Chol =   217  - Elevated             (  Ideal  or  Goal is less than 180  !  )  & -  Bad / Dangerous LDL  Chol = 126    - also Elevated              (  Ideal  or  Goal is less than 70  !  )   - Cholesterol is too high - Recommend a stricter  low cholesterol diet   - Cholesterol only comes from animal sources                                                                          - ie. meat, dairy, egg yolks  - Eat all the vegetables you want.  - Avoid Meat, Avoid Meat,  Avoid Meat                                                      - especially Red Meat - Beef AND Pork .  - Avoid cheese & dairy - milk & ice cream.     - Cheese is the most concentrated form of trans-fats which                                                                      is the worst thing to clog up our arteries.   - Veggie cheese is OK which can be found in the fresh                                     produce section at [employer] or [employer] or [employer]  <>*<>*<>*<>*<>*<>*<>*<>*<>*<>*<>*<>*<>*<>*<>*<>*<>*<>*<>*<>*<>*<>*<>*<>*<> <>*<>*<>*<>*<>*<>*<>*<>*<>*<>*<>*<>*<>*<>*<>*<>*<>*<>*<>*<>*<>*<>*<>*<>*<>  -    A1c= 5.8% is slightly elevated blood sugar ,  So    - Avoid Sweets, Candy & White Stuff   - White Rice, White [place], White Flour  - Breads &  Pasta  <>*<>*<>*<>*<>*<>*<>*<>*<>*<>*<>*<>*<>*<>*<>*<>*<>*<>*<>*<>*<>*<>*<>*<>*<> <>*<>*<>*<>*<>*<>*<>*<>*<>*<>*<>*<>*<>*<>*<>*<>*<>*<>*<>*<>*<>*<>*<>*<>*<>  - Vitamin D = 87 - Excellent - please continue vit   D     the dose same   <>*<>*<>*<>*<>*<>*<>*<>*<>*<>*<>*<>*<>*<>*<>*<>*<>*<>*<>*<>*<>*<>*<>*<>*<> <>*<>*<>*<>*<>*<>*<>*<>*<>*<>*<>*<>*<>*<>*<>*<>*<>*<>*<>*<>*<>*<>*<>*<>*<>  -   All Else - CBC - Kidneys - Electrolytes - Liver - Magnesium & Thyroid    - all  Normal /  OK  <>*<>*<>*<>*<>*<>*<>*<>*<>*<>*<>*<>*<>*<>*<>*<>*<>*<>*<>*<>*<>*<>*<>*<>*<> <>*<>*<>*<>*<>*<>*<>*<>*<>*<>*<>*<>*<>*<>*<>*<>*<>*<>*<>*<>*<>*<>*<>*<>*<>

## 2022-10-28 ENCOUNTER — Telehealth: Payer: Self-pay | Admitting: Nurse Practitioner

## 2022-10-28 ENCOUNTER — Encounter: Payer: Self-pay | Admitting: Nurse Practitioner

## 2022-10-28 ENCOUNTER — Ambulatory Visit: Payer: Medicare PPO | Admitting: Nurse Practitioner

## 2022-10-28 VITALS — BP 128/68 | HR 74 | Temp 97.6°F | Ht 62.5 in | Wt 107.0 lb

## 2022-10-28 DIAGNOSIS — K5901 Slow transit constipation: Secondary | ICD-10-CM

## 2022-10-28 DIAGNOSIS — R1032 Left lower quadrant pain: Secondary | ICD-10-CM

## 2022-10-28 DIAGNOSIS — R1031 Right lower quadrant pain: Secondary | ICD-10-CM

## 2022-10-28 NOTE — Telephone Encounter (Signed)
Pt's daughter called wondering of you could send in suppository for hemorrhoids. Pharm -- [facility] [phone] - [place], [place] - [address]

## 2022-10-28 NOTE — Patient Instructions (Signed)

## 2022-10-28 NOTE — Progress Notes (Signed)
Assessment and Plan:  Victoria Long was seen today for an episodic .  Diagnoses and all order for this visit:  Slow transit constipation/ Abdominal pain, right and left lower quadrant OTC Fleet enema or laxative suppository  Increase Linzess to 145 mcg daily Suggested exercises to help stimulate gut peristaltic movements Remain active and stay well hydrated Patient was able to have a large bowel movement while in office.    Notify office for further evaluation and treatment, questions or concerns if s/s fail to improve. The risks and benefits of my recommendations, as well as other treatment options were discussed with the patient today. Questions were answered.  Further disposition pending results of labs. Discussed med's effects and SE's.    Over 20 minutes of exam, counseling, chart review, and critical decision making was performed.   Future Appointments  Date Time Provider Department Center  01/27/2023  3:30 PM Adela Glimpse, NP [facility] None  04/29/2023  3:30 PM Lucky Cowboy, MD [facility] None  07/30/2023  3:00 PM Adela Glimpse, NP [facility] None    ------------------------------------------------------------------------------------------------------------------   HPI BP 128/68   Pulse 74   Temp 97.6 F (36.4 C)   Ht 5' 2.5" (1.588 m)   Wt 107 lb (48.5 kg)   SpO2 99%   BMI 19.26 kg/m   89 y.o.female presents for alongside DIL with complaints of being unable have a bowel movement over the last 5 days.  She has a hx of slow transit constipation and currently takes Linzess 72 mcg daily.  She does not feel as though this is helping.  At times she will take a Senokot.  She tries to stay active by doing daily work outs.  She reports drinking approximately 64 oz of fluids daily but admits to not always drinking this much.  Associated lower abdomina pain with cramping. Denies nausea, vomiting, blood in stool.    Past Medical History:  Diagnosis Date    Arthritis    knees   Breast cancer (HCC) 2019   Left Breast   Breast cancer (HCC) 2012   Right Breast   Cancer [facility]) 2012   right breast cancer   Hyperlipidemia    Melanoma (HCC) 2014   right humerus   Personal history of radiation therapy 2012   Right Breast Cancer   Personal history of radiation therapy 2019   Left Breast Cancer   Prediabetes    Vitamin D deficiency      Allergies  Allergen Reactions   Anastrozole    Tramadol     Medication caused dizziness    Current Outpatient Medications on File Prior to Visit  Medication Sig   acetaminophen (TYLENOL) 325 MG tablet Take 500 mg by mouth in the morning, at noon, and at bedtime.   calcium carbonate (OS-CAL) 600 MG TABS Take 600 mg by mouth 2 (two) times daily with a meal.    Cholecalciferol (VITAMIN D) 1000 UNITS capsule Take 1,000 Units by mouth 2 (two) times daily.    famotidine (PEPCID) 20 MG tablet TAKE 1 TABLET BY MOUTH EVERY NIGHT AT BEDTIME   hydrocortisone (ANUSOL-HC) 2.5 % rectal cream Place 1 application rectally 2 (two) times daily.   linaclotide (LINZESS) 72 MCG capsule TAKE 1 CAPSULE BY MOUTH DAILY BEFORE BREAKFAST   LYSINE HCL PO Take 500 mg by mouth daily.    Multiple Vitamins-Minerals (MULTIVITAMIN PO) Take by mouth daily.   Omega-3 Fatty Acids (FISH OIL PO) Take by mouth daily. Mega Red   No current  facility-administered medications on file prior to visit.    ROS: all negative except what is noted in the HPI.   Physical Exam:  BP 128/68   Pulse 74   Temp 97.6 F (36.4 C)   Ht 5' 2.5" (1.588 m)   Wt 107 lb (48.5 kg)   SpO2 99%   BMI 19.26 kg/m   General Appearance: NAD.  Awake, conversant and cooperative. Eyes: PERRLA, EOMs intact.  Sclera white.  Conjunctiva without erythema. Sinuses: No frontal/maxillary tenderness.  No nasal discharge. Nares patent.  ENT/Mouth: Ext aud canals clear.  Bilateral TMs w/DOL and without erythema or bulging. Hearing intact.  Posterior pharynx without swelling  or exudate.  Tonsils without swelling or erythema.  Neck: Supple.  No masses, nodules or thyromegaly. Respiratory: Effort is regular with non-labored breathing. Breath sounds are equal bilaterally without rales, rhonchi, wheezing or stridor.  Cardio: RRR with no MRGs. Brisk peripheral pulses without edema.  Abdomen: Active BS in all four quadrants.  Soft and non-tender without guarding, rebound tenderness, hernias or masses. Lymphatics: Non tender without lymphadenopathy.  Musculoskeletal: Full ROM, 5/5 strength, normal ambulation.  No clubbing or cyanosis. Skin: Appropriate color for ethnicity. Warm without rashes, lesions, ecchymosis, ulcers.  Neuro: CN II-XII grossly normal. Normal muscle tone without cerebellar symptoms and intact sensation.   Psych: AO X 3,  appropriate mood and affect, insight and judgment.     Adela Glimpse, NP 2:21 PM [facility]

## 2022-10-29 ENCOUNTER — Other Ambulatory Visit: Payer: Self-pay | Admitting: Nurse Practitioner

## 2022-10-29 DIAGNOSIS — K644 Residual hemorrhoidal skin tags: Secondary | ICD-10-CM

## 2022-10-29 MED ORDER — HYDROCORTISONE (PERIANAL) 2.5 % EX CREA
1.0000 | TOPICAL_CREAM | Freq: Two times a day (BID) | CUTANEOUS | 0 refills | Status: DC
Start: 2022-10-29 — End: 2023-05-23

## 2022-10-29 MED ORDER — HYDROCORTISONE ACETATE 25 MG RE SUPP
RECTAL | 3 refills | Status: DC
Start: 2022-10-29 — End: 2023-05-23

## 2022-11-04 ENCOUNTER — Telehealth: Payer: Self-pay | Admitting: Nurse Practitioner

## 2022-11-04 NOTE — Telephone Encounter (Signed)
Pt is struggling to have consistent bowel movements. She took Dulcolax on 10/7. She has only gone a little (her bowel is in clumps). Would you recommend her to take more Dulcolax or try something else?

## 2022-11-26 ENCOUNTER — Encounter (HOSPITAL_COMMUNITY): Payer: Self-pay

## 2022-11-26 ENCOUNTER — Emergency Department (HOSPITAL_COMMUNITY): Payer: Medicare PPO

## 2022-11-26 ENCOUNTER — Emergency Department (HOSPITAL_COMMUNITY)
Admission: EM | Admit: 2022-11-26 | Discharge: 2022-11-26 | Disposition: A | Discharge status: Home / Self Care | Payer: Medicare PPO | Attending: Emergency Medicine | Admitting: Emergency Medicine

## 2022-11-26 ENCOUNTER — Other Ambulatory Visit: Payer: Self-pay

## 2022-11-26 DIAGNOSIS — Z1152 Encounter for screening for COVID-19: Secondary | ICD-10-CM | POA: Diagnosis not present

## 2022-11-26 DIAGNOSIS — R079 Chest pain, unspecified: Secondary | ICD-10-CM | POA: Insufficient documentation

## 2022-11-26 DIAGNOSIS — J029 Acute pharyngitis, unspecified: Secondary | ICD-10-CM | POA: Insufficient documentation

## 2022-11-26 DIAGNOSIS — I517 Cardiomegaly: Secondary | ICD-10-CM | POA: Diagnosis not present

## 2022-11-26 DIAGNOSIS — R0789 Other chest pain: Secondary | ICD-10-CM | POA: Diagnosis not present

## 2022-11-26 DIAGNOSIS — J449 Chronic obstructive pulmonary disease, unspecified: Secondary | ICD-10-CM | POA: Diagnosis not present

## 2022-11-26 LAB — RESP PANEL BY RT-PCR (RSV, FLU A&B, COVID)  RVPGX2
Influenza A by PCR: NEGATIVE
Influenza B by PCR: NEGATIVE
Resp Syncytial Virus by PCR: NEGATIVE
SARS Coronavirus 2 by RT PCR: NEGATIVE

## 2022-11-26 LAB — BASIC METABOLIC PANEL
Anion gap: 10 (ref 5–15)
BUN: 18 mg/dL (ref 8–23)
CO2: 24 mmol/L (ref 22–32)
Calcium: 9.8 mg/dL (ref 8.9–10.3)
Chloride: 104 mmol/L (ref 98–111)
Creatinine, Ser: 0.71 mg/dL (ref 0.44–1.00)
GFR, Estimated: >60 mL/min (ref >60)
Glucose, Bld: 94 mg/dL (ref 70–99)
Potassium: 4 mmol/L (ref 3.5–5.1)
Sodium: 138 mmol/L (ref 135–145)

## 2022-11-26 LAB — CBC
HCT: 36.8 % (ref 36.0–46.0)
Hemoglobin: 11.7 g/dL — ABNORMAL LOW (ref 12.0–15.0)
MCH: 32.2 pg (ref 26.0–34.0)
MCHC: 31.8 g/dL (ref 30.0–36.0)
MCV: 101.4 fL — ABNORMAL HIGH (ref 80.0–100.0)
Platelets: 221 10*3/uL (ref 150–400)
RBC: 3.63 MIL/uL — ABNORMAL LOW (ref 3.87–5.11)
RDW: 12.3 % (ref 11.5–15.5)
WBC: 6 10*3/uL (ref 4.0–10.5)
nRBC: 0 % (ref 0.0–0.2)

## 2022-11-26 LAB — TROPONIN I (HIGH SENSITIVITY)
Troponin I (High Sensitivity): 8 ng/L (ref <18)
Troponin I (High Sensitivity): 8 ng/L (ref <18)

## 2022-11-26 LAB — GROUP A STREP BY PCR: Group A Strep by PCR: NOT DETECTED

## 2022-11-26 NOTE — Discharge Instructions (Addendum)
Your workup was reassuring.  No concerning cause of your chest pain or sore throat.  Follow-up with your primary care doctor.

## 2022-11-26 NOTE — ED Provider Notes (Signed)
Signout received on this 89 year old female who presented with pharyngitis.  Pending repeat troponin at the time of shift change. Physical Exam  BP (!) 153/65   Pulse 61   Temp 97.7 F (36.5 C) (Oral)   Resp 16   Ht 5\' 1"  (1.549 m)   Wt 47.6 kg   SpO2 99%   BMI 19.84 kg/m     Procedures  Procedures  ED Course / MDM   Clinical Course as of 11/26/22 0729  Tue Nov 26, 2022  0546 Resp panel by RT-PCR (RSV, Flu A&B, Covid) Throat [RB]  0546 Group A Strep by PCR [RB]  0546 Basic metabolic panel [RB]  0546 Troponin I (High Sensitivity) Repeat trop pending... [RB]  0547 Troponin I (High Sensitivity) No significant leukocytosis [RB]    Clinical Course User Index [RB] Roxy Horseman, PA-C   Medical Decision Making Amount and/or Complexity of Data Reviewed Labs: ordered. Decision-making details documented in ED Course. Radiology: ordered.   Repeat troponin flat.  Patient discharged in stable condition.  Discussed follow-up with PCP.  Patient and family agreeable.       Marita Kansas, PA-C 11/26/22 1610    Rolan Bucco, MD 11/26/22 1034

## 2022-11-26 NOTE — ED Triage Notes (Signed)
Pt arrived via POV c/o sore swollen throat that woke her up from a deep sleep. Pt states at first her tongue felt swollen, but it feels normal now. Pt states that her throat still feels swollen, sore and that it is hard to swallow. 7/10 pain scale

## 2022-11-26 NOTE — ED Provider Notes (Signed)
[facility]PT [facility] Emergency Department Provider Note MRN:  [id number]  Arrival date & time: 11/27/22     Chief Complaint   Sore Throat   History of Present Illness   Victoria Long is a 89 y.o. year-old female presents to the ED with chief complaint of sore throat.  She states that she awakened from sleep with sore, dry, scratchy throat and felt like her tongue was swelling.  She lives at home alone and called her family members to bring her to the ER.  No interventions tried PTA.  No hx of sleep apnea or snoring.  Family states that she does fall asleep with her head back and mouth open.  She denies any sick contacts.  States that she experiences some brief chest pain, but this has resolved and only last a couple of minutes.  History provided by patient.   Review of Systems  Pertinent positive and negative review of systems noted in HPI.    Physical Exam   Vitals:   11/26/22 0530 11/26/22 0600  BP: (!) 148/69 (!) 153/65  Pulse: (!) 58 61  Resp: 12 16  Temp:    SpO2: 100% 99%    CONSTITUTIONAL:  elderly-appearing, NAD NEURO:  Alert and oriented x 3, CN 3-12 grossly intact EYES:  eyes equal and reactive ENT/NECK:  Supple, no stridor, no tongue swelling, oropharynx is clear CARDIO:  normal rate, regular rhythm, appears well-perfused  PULM:  No respiratory distress, CTAB GI/GU:  non-distended, non-tender MSK/SPINE:  No gross deformities, no edema, moves all extremities  SKIN:  no rash, atraumatic   *Additional and/or pertinent findings included in MDM below  Diagnostic and Interventional Summary    EKG Interpretation Date/Time:    Ventricular Rate:    PR Interval:    QRS Duration:    QT Interval:    QTC Calculation:   R Axis:      Text Interpretation:         Labs Reviewed  CBC - Abnormal; Notable for the following components:      Result Value   RBC 3.63 (*)    Hemoglobin 11.7 (*)    MCV 101.4 (*)    All other components within  normal limits  GROUP A STREP BY PCR  RESP PANEL BY RT-PCR (RSV, FLU A&B, COVID)  [id number]  BASIC METABOLIC PANEL  TROPONIN I (HIGH SENSITIVITY)  TROPONIN I (HIGH SENSITIVITY)    DG Chest 2 View  Final Result      Medications - No data to display   Procedures  /  Critical Care Procedures  ED Course and Medical Decision Making  I have reviewed the triage vital signs, the nursing notes, and pertinent available records from the EMR.  Social Determinants Affecting Complexity of Care: Patient has no clinically significant social determinants affecting this chief complaint..   ED Course: Clinical Course as of 11/27/22 0545  Tue Nov 26, 2022  0546 Resp panel by RT-PCR (RSV, Flu A&B, Covid) Throat [RB]  0546 Group A Strep by PCR [RB]  0546 Basic metabolic panel [RB]  0546 Troponin I (High Sensitivity) Repeat trop pending... [RB]  0547 Troponin I (High Sensitivity) No significant leukocytosis [RB]    Clinical Course User Index [RB] Roxy Horseman, PA-C    Medical Decision Making Patient here with sore throat after awakening.  She describes it as dry, scratchy, and sore.  She has been able to drink.  She says that she had a brief episode of chest pain  that lasted a couple of minutes.  Oropharynx appears normal.  No stridor.  No exudates or abscess.  She has normal O2 sats.  Family says that she does fall asleep with her mouth open.  ? If she fell asleep and was mouth breathing that caused dry throat/irritation.    She looks extremely well for her age.  Workup thus far has been reassuring.  I suspect that she will be able to go home after repeat troponin.  She states that she is feeling better now.  Amount and/or Complexity of Data Reviewed Labs: ordered. Decision-making details documented in ED Course. Radiology: ordered.         Consultants: No consultations were needed in caring for this patient.   Treatment and Plan: I considered admission due to patient's initial  presentation, but after considering the examination and diagnostic results, patient will not require admission and can be discharged with outpatient follow-up.  Patient discussed with attending physician, Dr. Bebe Shaggy.  Final Clinical Impressions(s) / ED Diagnoses     ICD-10-CM   1. Sore throat  J02.9     2. Chest pain, unspecified type  R07.9       ED Discharge Orders     None         Discharge Instructions Discussed with and Provided to Patient:     Discharge Instructions      Your workup was reassuring.  No concerning cause of your chest pain or sore throat.  Follow-up with your primary care doctor.       Roxy Horseman, PA-C 11/27/22 5621    Zadie Rhine, MD 11/27/22 [phone]

## 2022-12-05 ENCOUNTER — Other Ambulatory Visit: Payer: Self-pay

## 2022-12-05 MED ORDER — LINACLOTIDE 145 MCG PO CAPS: 145.0000 ug | ORAL_CAPSULE | Freq: Every day | ORAL | 2 refills | Status: DC

## 2022-12-05 NOTE — Progress Notes (Signed)
Per Archie Patten, ok to send in new prescription for Linzess, 145mg  instead of the 72mg .

## 2022-12-12 ENCOUNTER — Telehealth: Payer: Self-pay

## 2022-12-12 NOTE — Telephone Encounter (Signed)
Inquiring about talking next time with the provider about recommendations for a doctor that does laser surgery for hemorrhoids.

## 2022-12-12 NOTE — Telephone Encounter (Signed)
Pt wants to speak with Melissa about Linzess and Hemorid.

## 2023-01-06 ENCOUNTER — Telehealth: Payer: Self-pay | Admitting: Nurse Practitioner

## 2023-01-06 ENCOUNTER — Other Ambulatory Visit: Payer: Self-pay

## 2023-01-06 ENCOUNTER — Other Ambulatory Visit: Payer: Self-pay | Admitting: Nurse Practitioner

## 2023-01-06 DIAGNOSIS — K2100 Gastro-esophageal reflux disease with esophagitis, without bleeding: Secondary | ICD-10-CM

## 2023-01-06 MED ORDER — FAMOTIDINE 20 MG PO TABS: ORAL_TABLET | ORAL | 1 refills | Status: DC

## 2023-01-06 NOTE — Telephone Encounter (Signed)
Refill on PEPCID. Please send to  [facility] [id number] - [address], [place] - [address]

## 2023-01-07 ENCOUNTER — Ambulatory Visit (INDEPENDENT_AMBULATORY_CARE_PROVIDER_SITE_OTHER): Payer: Medicare PPO | Admitting: Nurse Practitioner

## 2023-01-07 ENCOUNTER — Encounter: Payer: Self-pay | Admitting: Nurse Practitioner

## 2023-01-07 VITALS — BP 138/62 | HR 66 | Temp 98.3°F | Ht 61.0 in | Wt 105.0 lb

## 2023-01-07 DIAGNOSIS — K5901 Slow transit constipation: Secondary | ICD-10-CM | POA: Diagnosis not present

## 2023-01-07 DIAGNOSIS — K644 Residual hemorrhoidal skin tags: Secondary | ICD-10-CM | POA: Diagnosis not present

## 2023-01-07 NOTE — Progress Notes (Signed)
Assessment and Plan:  Victoria Long was seen today for an episodic visit.  Diagnoses and all order for this visit:  External hemorrhoids (Primary) Discussed possible referral for treatment with laser therapy. Continue current therapy with topicals and daily medication to help soften stool. Remain active and stay well hydrated.    Slow transit constipation Continue Linzess - if experiencing diarrhea reduce to every other day or every 3rd.  May take stool softener in between if no response with normal bowel movements. Stay well hydrated. Continue to remain active.   Notify office for further evaluation and treatment, questions or concerns if s/s fail to improve. The risks and benefits of my recommendations, as well as other treatment options were discussed with the patient today. Questions were answered.  Further disposition pending results of labs. Discussed med's effects and SE's.    Over 20 minutes of exam, counseling, chart review, and critical decision making was performed.   Future Appointments  Date Time Provider Department Center  01/27/2023  3:30 PM Adela Glimpse, NP [facility] None  04/29/2023  3:30 PM Lucky Cowboy, MD [facility] None  07/30/2023  3:00 PM Adahlia Stembridge, Archie Patten, NP [facility] None    ------------------------------------------------------------------------------------------------------------------   HPI BP 138/62   Pulse 66   Temp 98.3 F (36.8 C)   Ht 5\' 1"  (1.549 m)   Wt 105 lb (47.6 kg)   SpO2 99%   BMI 19.84 kg/m   89 y.o.female presents for evaluation to treat hemorrhoids.  She continues to experience painful external hemorrhoids that is not responsive to topical treatments such as Preparation H or steroid suppositories.  She continues to treat constipation with daily Linaclotide and has had intermittent success with bowel movements.  She is interested in treatment with laser, if optional.    Past Medical History:  Diagnosis Date    Arthritis    knees   Breast cancer (HCC) 2019   Left Breast   Breast cancer (HCC) 2012   Right Breast   Cancer [facility]) 2012   right breast cancer   Hyperlipidemia    Melanoma (HCC) 2014   right humerus   Personal history of radiation therapy 2012   Right Breast Cancer   Personal history of radiation therapy 2019   Left Breast Cancer   Prediabetes    Vitamin D deficiency      Allergies  Allergen Reactions   Anastrozole    Tramadol     Medication caused dizziness    Current Outpatient Medications on File Prior to Visit  Medication Sig   acetaminophen (TYLENOL) 325 MG tablet Take 500 mg by mouth in the morning, at noon, and at bedtime.   calcium carbonate (OS-CAL) 600 MG TABS Take 600 mg by mouth 2 (two) times daily with a meal.    Cholecalciferol (VITAMIN D) 1000 UNITS capsule Take 1,000 Units by mouth 2 (two) times daily.    famotidine (PEPCID) 20 MG tablet TAKE 1 TABLET BY MOUTH EVERY NIGHT AT BEDTIME   hydrocortisone (ANUSOL-HC) 2.5 % rectal cream Place 1 Application rectally 2 (two) times daily.   hydrocortisone (ANUSOL-HC) 25 MG suppository Insert 1 rectal suppository 2 x /day   linaclotide (LINZESS) 145 MCG CAPS capsule Take 1 capsule (145 mcg total) by mouth daily before breakfast. (Patient taking differently: Take 145 mcg by mouth daily before breakfast. Taking every other day)   LYSINE HCL PO Take 500 mg by mouth daily.    Multiple Vitamins-Minerals (MULTIVITAMIN PO) Take by mouth daily.   Omega-3 Fatty  Acids (FISH OIL PO) Take by mouth daily. Mega Red   No current facility-administered medications on file prior to visit.    ROS: all negative except what is noted in the HPI.   Physical Exam:  BP 138/62   Pulse 66   Temp 98.3 F (36.8 C)   Ht 5\' 1"  (1.549 m)   Wt 105 lb (47.6 kg)   SpO2 99%   BMI 19.84 kg/m   General Appearance: NAD.  Awake, conversant and cooperative. Eyes: PERRLA, EOMs intact.  Sclera white.  Conjunctiva without erythema. Sinuses: No  frontal/maxillary tenderness.  No nasal discharge. Nares patent.  ENT/Mouth: Ext aud canals clear.  Bilateral TMs w/DOL and without erythema or bulging. Hearing intact.  Posterior pharynx without swelling or exudate.  Tonsils without swelling or erythema.  Neck: Supple.  No masses, nodules or thyromegaly. Respiratory: Effort is regular with non-labored breathing. Breath sounds are equal bilaterally without rales, rhonchi, wheezing or stridor.  Cardio: RRR with no MRGs. Brisk peripheral pulses without edema.  Abdomen: Active BS in all four quadrants.  Soft and non-tender without guarding, rebound tenderness, hernias or masses. Lymphatics: Non tender without lymphadenopathy.  Musculoskeletal: Full ROM, 5/5 strength, normal ambulation.  No clubbing or cyanosis. Skin: Appropriate color for ethnicity. Warm without rashes, lesions, ecchymosis, ulcers.  Neuro: CN II-XII grossly normal. Normal muscle tone without cerebellar symptoms and intact sensation.   Psych: AO X 3,  appropriate mood and affect, insight and judgment.     Adela Glimpse, NP 2:59 PM [facility]

## 2023-01-07 NOTE — Patient Instructions (Signed)
Hemorrhoids Hemorrhoids are swollen veins that may form: In the butt (rectum). These are called internal hemorrhoids. Around the opening of the butt (anus). These are called external hemorrhoids. Most hemorrhoids do not cause very bad problems. They often get better with changes to your lifestyle and what you eat. What are the causes? Having trouble pooping (constipation) or watery poop (diarrhea). Pushing too hard when you poop. Pregnancy. Being very overweight (obese). Sitting for too long. Riding a bike for a long time. Heavy lifting or other things that take a lot of effort. Anal sex. What are the signs or symptoms? Pain. Itching or soreness in the butt. Bleeding from the butt. Leaking poop. Swelling. One or more lumps around the opening of your butt. How is this treated? In most cases, hemorrhoids can be treated at home. You may be told to: Change what you eat. Make changes to your lifestyle. If these treatments do not help, you may need to have a procedure done. Your doctor may need to: Place rubber bands at the bottom of the hemorrhoids to make them fall off. Put medicine into the hemorrhoids to shrink them. Shine a type of light on the hemorrhoids to cause them to fall off. Do surgery to get rid of the hemorrhoids. Follow these instructions at home: Medicines Take over-the-counter and prescription medicines only as told by your doctor. Use creams with medicine in them or medicines that you put in your butt as told by your doctor. Eating and drinking  Eat foods that have a lot of fiber in them. These include whole grains, beans, nuts, fruits, and vegetables. Ask your doctor about taking products that have fiber added to them (fibersupplements). Take in less fat. You can do this by: Eating low-fat dairy products. Eating less red meat. Staying away from processed foods. Drink enough fluid to keep your pee (urine) pale yellow. Managing pain and swelling  Take a  warm-water bath (sitz bath) for 20 minutes to ease pain. Do this 3-4 times a day. You may do this in a bathtub. You may also use a portable sitz bath that fits over the toilet. If told, put ice on the painful area. It may help to use ice between your warm baths. Put ice in a plastic bag. Place a towel between your skin and the bag. Leave the ice on for 20 minutes, 2-3 times a day. If your skin turns bright red, take off the ice right away to prevent skin damage. The risk of damage is higher if you cannot feel pain, heat, or cold. General instructions Exercise. Ask your doctor how much and what kind of exercise is best for you. Go to the bathroom when you need to poop. Do not wait. Try not to push too hard when you poop. Keep your butt dry and clean. Use wet toilet paper or moist towelettes after you poop. Do not sit on the toilet for a long time. Contact a doctor if: You have pain and swelling that do not get better with treatment. You have trouble pooping. You cannot poop. You have pain or swelling outside the area of the hemorrhoids. Get help right away if: You have bleeding from the butt that will not stop. This information is not intended to replace advice given to you by your health care provider. Make sure you discuss any questions you have with your health care provider. Document Revised: 09/19/2021 Document Reviewed: 09/19/2021 Elsevier Patient Education  2024 [employer]  

## 2023-01-27 ENCOUNTER — Encounter: Payer: Self-pay | Admitting: Nurse Practitioner

## 2023-01-27 ENCOUNTER — Ambulatory Visit: Payer: Medicare PPO | Admitting: Nurse Practitioner

## 2023-01-27 VITALS — BP 120/62 | HR 66 | Temp 98.6°F | Ht 61.0 in | Wt 105.4 lb

## 2023-01-27 DIAGNOSIS — I700 Atherosclerosis of aorta: Secondary | ICD-10-CM

## 2023-01-27 DIAGNOSIS — I10 Essential (primary) hypertension: Secondary | ICD-10-CM

## 2023-01-27 DIAGNOSIS — Z9181 History of falling: Secondary | ICD-10-CM

## 2023-01-27 DIAGNOSIS — E559 Vitamin D deficiency, unspecified: Secondary | ICD-10-CM

## 2023-01-27 DIAGNOSIS — R6889 Other general symptoms and signs: Secondary | ICD-10-CM | POA: Diagnosis not present

## 2023-01-27 DIAGNOSIS — K5901 Slow transit constipation: Secondary | ICD-10-CM

## 2023-01-27 DIAGNOSIS — E785 Hyperlipidemia, unspecified: Secondary | ICD-10-CM

## 2023-01-27 DIAGNOSIS — D0361 Melanoma in situ of right upper limb, including shoulder: Secondary | ICD-10-CM

## 2023-01-27 DIAGNOSIS — R7309 Other abnormal glucose: Secondary | ICD-10-CM

## 2023-01-27 DIAGNOSIS — K8689 Other specified diseases of pancreas: Secondary | ICD-10-CM

## 2023-01-27 DIAGNOSIS — Z0000 Encounter for general adult medical examination without abnormal findings: Secondary | ICD-10-CM

## 2023-01-27 DIAGNOSIS — Z0001 Encounter for general adult medical examination with abnormal findings: Secondary | ICD-10-CM

## 2023-01-27 DIAGNOSIS — C50912 Malignant neoplasm of unspecified site of left female breast: Secondary | ICD-10-CM

## 2023-01-27 DIAGNOSIS — C50412 Malignant neoplasm of upper-outer quadrant of left female breast: Secondary | ICD-10-CM

## 2023-01-27 DIAGNOSIS — D692 Other nonthrombocytopenic purpura: Secondary | ICD-10-CM

## 2023-01-27 DIAGNOSIS — M1711 Unilateral primary osteoarthritis, right knee: Secondary | ICD-10-CM

## 2023-01-27 NOTE — Patient Instructions (Signed)

## 2023-01-27 NOTE — Progress Notes (Signed)
 MEDICARE ANNUAL WELLNESS VISIT AND FOLLOW UP  Assessment and Plan: Encounter for Medicare Annual Wellness Visit Due annually Health maintenance reviwed.  Senile purpura (HCC) Monitor CBC  Malignant neoplasm of upper-outer quadrant of left breast in female, estrogen receptor positive (HCC) Monitor  Atherosclerosis of aorta (HCC) Discussed lifestyle modifications. Recommended diet heavy in fruits and veggies, omega 3's. Decrease consumption of animal meats, cheeses, and dairy products. Remain active and exercise as tolerated. Continue to monitor. Check lipids/TSH  Melanoma in situ of upper arm, right (HCC) Continue follow up derm  Invasive ductal carcinoma of breast, female, left (HCC) Monitor  Essential hypertension Discussed DASH (Dietary Approaches to Stop Hypertension) DASH diet is lower in sodium than a typical American diet. Cut back on foods that are high in saturated fat, cholesterol, and trans fats. Eat more whole-grain foods, fish, poultry, and nuts Remain active and exercise as tolerated daily.  Monitor BP at home-Call if greater than 130/80.  Check CMP/CBC  Hyperlipidemia, unspecified hyperlipidemia type Discussed lifestyle modifications. Recommended diet heavy in fruits and veggies, omega 3's. Decrease consumption of animal meats, cheeses, and dairy products. Remain active and exercise as tolerated. Continue to monitor. Check lipids/TSH    Abnormal glucose Education: Reviewed 'ABCs' of diabetes management  Discussed goals to be met and/or maintained include A1C (<7) Blood pressure (<130/80) Cholesterol (LDL <70) Continue Eye Exam yearly  Continue Dental Exam Q6 mo Discussed dietary recommendations Discussed Physical Activity recommendations Foot exam UTD Check A1C   Vitamin D  deficiency Continue supplement Monitor levels  Pancreatic mass Monitor  At high risk for falls Continue cane, continue walking, can send PT as needed.   Unilateral  primary osteoarthritis, right knee Continue follow up ortho Rest when flared Stay well hydrated  Constipation Continue Linzess  Stay well hydrated Continue to monitor  Review of recent blood work stable - obtain at [facility].  Notify office for further evaluation and treatment, questions or concerns if any reported s/s fail to improve.   The patient was advised to call back or seek an in-person evaluation if any symptoms worsen or if the condition fails to improve as anticipated.   Further disposition pending results of labs. Discussed med's effects and SE's.    I discussed the assessment and treatment plan with the patient. The patient was provided an opportunity to ask questions and all were answered. The patient agreed with the plan and demonstrated an understanding of the instructions.  Discussed med's effects and SE's. Screening labs and tests as requested with regular follow-up as recommended.  I provided 30 minutes of face-to-face time during this encounter including counseling, chart review, and critical decision making was preformed.   Future Appointments  Date Time Provider Department Center  04/29/2023  3:30 PM Tonita Fallow, MD [facility] None  07/30/2023  3:00 PM Laurice President, NP [facility] None  01/28/2024  2:00 PM Laurice President, NP [facility] None    Plan:   During the course of the visit the patient was educated and counseled about appropriate screening and preventive services including:   Pneumococcal vaccine  Prevnar 13 Influenza vaccine Td vaccine Screening electrocardiogram Bone densitometry screening Colorectal cancer screening Diabetes screening Glaucoma screening Nutrition counseling  Advanced directives: requested   HPI  89 y.o. female  presents for a medicare and 3 month follow up.   Overall she reports feeling well.  She has recently been seen and treated for constipation.  Was provided Linzess  which has provided relief and continues to be  effective however, having bouts  of constipation and still taking PRN.  She is interested in laser therapy for hemorrhoids.   She has remote history of smoking, uncontrolled chol, remote history of melanoma and breast cancer.    She is not on any medications. today their BP is BP: 120/62.  She does not workout due to knee pain. She denies chest pain, shortness of breath, dizziness.   BMI is Body mass index is 19.92 kg/m., she is working on diet and exercise. Wt Readings from Last 3 Encounters:  01/27/23 105 lb 6.4 oz (47.8 kg)  01/07/23 105 lb (47.6 kg)  11/26/22 105 lb (47.6 kg)   She has graduated from [facility] for history of right breast cancer. 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  Stage IA., was on anastrozole , off now and still following with Dr. Magrinot.   She has right knee pain, walking with cane, on 1-3 tylenol  500mg , takes with food. Follows with Dr. Vernetta.   She is not on cholesterol medicationHer cholesterol is not at goal. She is trying to watch saturated fats and is exercising daily. The cholesterol last visit was:  Lab Results  Component Value Date   CHOL 217 (H) 10/17/2022   HDL 68 10/17/2022   LDLCALC 126 (H) 10/17/2022   TRIG 119 10/17/2022   CHOLHDL 3.2 10/17/2022  . She has been working on diet and exercise for prediabetes, she is on bASA, she is not on ACE/ARB and denies foot ulcerations, hyperglycemia, hypoglycemia , increased appetite, nausea, paresthesia of the feet, polydipsia, polyuria, visual disturbances, vomiting and weight loss. Last A1C in the office was:  Lab Results  Component Value Date   HGBA1C 5.8 (H) 10/17/2022   Patient is on Vitamin D  supplement.   Lab Results  Component Value Date   VD25OH 66 10/17/2022     She is seeing her dermatologist every 6 months for skin checks due to history of  melanoma.    Current Medications:      Current Outpatient Medications (Analgesics):    acetaminophen  (TYLENOL ) 325 MG tablet, Take 500 mg by mouth in the morning, at noon, and at bedtime.   Current Outpatient Medications (Other):    calcium  carbonate (OS-CAL) 600 MG TABS, Take 600 mg by mouth 2 (two) times daily with a meal.    Cholecalciferol  (VITAMIN D ) 1000 UNITS capsule, Take 1,000 Units by mouth 2 (two) times daily.    famotidine  (PEPCID ) 20 MG tablet, TAKE 1 TABLET BY MOUTH EVERY NIGHT AT BEDTIME   hydrocortisone  (ANUSOL -HC) 2.5 % rectal cream, Place 1 Application rectally 2 (two) times daily.   hydrocortisone  (ANUSOL -HC) 25 MG suppository, Insert 1 rectal suppository 2 x /day   LYSINE HCL PO, Take 500 mg by mouth daily.    Multiple Vitamins-Minerals (MULTIVITAMIN PO), Take by mouth daily.   Omega-3 Fatty Acids (FISH OIL PO), Take by mouth daily. Mega Red   linaclotide  (LINZESS ) 145 MCG CAPS capsule, Take 1 capsule (145 mcg total) by mouth daily before breakfast. (Patient not taking: Reported on 01/27/2023)  Health Maintenance:   Immunization History  Administered Date(s) Administered   Influenza, High Dose Seasonal PF 11/08/2013, 10/04/2014, 11/13/2015, 10/31/2016, 11/12/2017, 10/06/2018, 10/18/2021, 10/27/2021, 10/17/2022   Influenza-Unspecified 11/10/2012, 10/04/2014, 10/13/2020   PFIZER(Purple Top)SARS-COV-2 Vaccination 02/05/2019, 02/26/2019, 12/23/2019   Pneumococcal Conjugate-13 07/27/2013   Pneumococcal-Unspecified 11/30/2010   Respiratory Syncytial Virus Vaccine,Recomb Aduvanted(Arexvy) 01/27/2022  Td 02/12/2009   Zoster Recombinant(Shingrix) 09/17/2018, 11/17/2018   Zoster, Live 02/12/2006   Health Maintenance  Topic Date Due   Pneumonia Vaccine 31+ Years old (2 of 2 - PPSV23 or PCV20) 07/28/2014   DTaP/Tdap/Td (2 - Tdap) 02/13/2019   COVID-19 Vaccine (4 - 2024-25 season) 09/22/2022   Medicare Annual Wellness (AWV)  01/27/2024   INFLUENZA VACCINE  Completed    DEXA SCAN  Completed   Zoster Vaccines- Shingrix  Completed   HPV VACCINES  Aged Out   Tetanus: 2011 Pneumovax: 2012 Prevnar:  2015 Flu vaccine: 09/2022 Zostavax:  2008 Pfizer completed- will message dates Shingrix- confirmed  MGM: 07/2020 DEXA: 2011, refuses further Colonoscopy: 2004 declines further due to age Last Eye Exam:  Dr. Leslee, 2023 Dentist: Dr. Encarnacion 2023 PAP 2014 declines further due to age  Patient Care Team: Tonita Fallow, MD as PCP - General (Internal Medicine) Avram Lupita BRAVO, MD as Consulting Physician (Gastroenterology) Dozier Agent, MD as Referring Physician (Orthopedic Surgery) Cary Doffing, MD as Consulting Physician (Dermatology) Magrinat, Sandria BROCKS, MD (Inactive) as Consulting Physician (Oncology) Jadine Charleston, MD as Consulting Physician (Dermatology) Vernetta Berg, MD as Consulting Physician (General Surgery)  Medical History:  Past Medical History:  Diagnosis Date   Arthritis    knees   Breast cancer [facility]) 2019   Left Breast   Breast cancer [facility]) 2012   Right Breast   Cancer [facility]) 2012   right breast cancer   Hyperlipidemia    Melanoma (HCC) 2014   right humerus   Personal history of radiation therapy 2012   Right Breast Cancer   Personal history of radiation therapy 2019   Left Breast Cancer   Prediabetes    Vitamin D  deficiency    Allergies Allergies  Allergen Reactions   Anastrozole     Tramadol      Medication caused dizziness    SURGICAL HISTORY She  has a past surgical history that includes Cataract extraction (Bilateral, 2009); Cesarean section; Melanoma excision (Right, 2014); Breast biopsy (Right, 05/28/2010); Mastectomy, partial (Left, 07/29/2017); Breast lumpectomy (Right, 06/29/2010); and Breast lumpectomy (Left, 08/08/2017). FAMILY HISTORY Her family history includes Arthritis in her mother; Cancer in her sister; Diabetes in her maternal grandmother; Heart attack in her brother; Hypertension in her sister;  Rheum arthritis in her mother. SOCIAL HISTORY She  reports that she quit smoking about 46 years ago. Her smoking use included cigarettes. She started smoking about 56 years ago. She has never used smokeless tobacco. She reports current alcohol use. She reports that she does not use drugs.    MEDICARE WELLNESS OBJECTIVES: Physical activity: Current Exercise Habits: The patient does not participate in regular exercise at present Cardiac risk factors:   Depression/mood screen:      01/27/2023    5:14 PM  Depression screen PHQ 2/9  Decreased Interest 0  Down, Depressed, Hopeless 0  PHQ - 2 Score 0    ADLs:     01/27/2023    5:14 PM  In your present state of health, do you have any difficulty performing the following activities:  Hearing? 0  Vision? 0  Difficulty concentrating or making decisions? 0  Walking or climbing stairs? 0  Dressing or bathing? 0  Doing errands, shopping? 0     Cognitive Testing  Alert? Yes  Normal Appearance?Yes  Oriented to person? Yes  Place? Yes   Time? Yes  Recall of three objects?  Yes  Can perform simple calculations? Yes  Displays appropriate judgment?Yes  Can read the  correct time from a watch face?Yes  EOL planning:      Review of Systems: Review of Systems  Constitutional:  Negative for chills, fever, malaise/fatigue and weight loss.  HENT:  Negative for congestion, ear pain, hearing loss and sore throat.   Eyes: Negative.  Negative for blurred vision and double vision.  Respiratory:  Negative for cough, shortness of breath and wheezing.   Cardiovascular:  Negative for chest pain, palpitations, orthopnea and leg swelling.  Gastrointestinal:  Negative for abdominal pain, blood in stool, constipation, diarrhea, heartburn, melena, nausea and vomiting.  Genitourinary: Negative.   Musculoskeletal:  Positive for joint pain (right knee). Negative for back pain, falls, myalgias and neck pain.  Skin: Negative.  Negative for rash.  Neurological:   Negative for dizziness, tingling, tremors, sensory change, loss of consciousness and headaches.  Endo/Heme/Allergies:  Bruises/bleeds easily.  Psychiatric/Behavioral:  Negative for depression, memory loss and suicidal ideas. The patient is not nervous/anxious and does not have insomnia.     Physical Exam: Estimated body mass index is 19.92 kg/m as calculated from the following:   Height as of this encounter: 5' 1 (1.549 m).   Weight as of this encounter: 105 lb 6.4 oz (47.8 kg). BP 120/62   Pulse 66   Temp 98.6 F (37 C)   Ht 5' 1 (1.549 m)   Wt 105 lb 6.4 oz (47.8 kg)   SpO2 99%   BMI 19.92 kg/m   General Appearance: Well nourished well developed, in no apparent distress.  Eyes: PERRLA, EOMs, left eye with stye external upper lid, conjunctiva no swelling or erythema ENT/Mouth: Ear canals normal without obstruction after removal from left ear, swelling, erythema, or discharge.  TMs normal bilaterally with no erythema, bulging, retraction, or loss of landmark.  Oropharynx moist and clear with no exudate, erythema, or swelling.   Neck: Supple, thyroid  normal. No bruits.  No cervical adenopathy Respiratory: Respiratory effort normal, Breath sounds clear A&P without wheeze, rhonchi, rales.   Cardio: RRR without murmurs, rubs or gallops. Brisk peripheral pulses without edema.  Chest: symmetric, with normal excursions Breasts: defer Abdomen: Soft, nontender, no guarding, rebound, hernias, masses, or organomegaly.  Lymphatics: Non tender without lymphadenopathy.  Genitourinary: defer Musculoskeletal: Full ROM all peripheral extremities,5/5 strength, and antalgic gait, negative straight leg. Right knee cracking heard with extension Skin: Warm, dry without rashes, lesions, ecchymosis. Neuro: Awake and oriented X 3, Cranial nerves intact, reflexes equal bilaterally. Normal muscle tone, no cerebellar symptoms. Sensation intact.  Psych:  normal affect, Insight and Judgment appropriate.     Medicare Attestation I have personally reviewed: The patient's medical and social history Their use of alcohol, tobacco or illicit drugs Their current medications and supplements The patient's functional ability including ADLs,fall risks, home safety risks, cognitive, and hearing and visual impairment Diet and physical activities Evidence for depression or mood disorders  The patient's weight, height, BMI, and visual acuity have been recorded in the chart.  I have made referrals, counseling, and provided education to the patient based on review of the above and I have provided the patient with a written personalized care plan for preventive services.  Rishik Tubby 5:14 PM [facility]

## 2023-01-29 DIAGNOSIS — H52203 Unspecified astigmatism, bilateral: Secondary | ICD-10-CM | POA: Diagnosis not present

## 2023-01-29 DIAGNOSIS — Z961 Presence of intraocular lens: Secondary | ICD-10-CM | POA: Diagnosis not present

## 2023-02-24 ENCOUNTER — Ambulatory Visit: Payer: Medicare PPO | Admitting: Nurse Practitioner

## 2023-04-15 ENCOUNTER — Encounter (HOSPITAL_COMMUNITY): Payer: Self-pay

## 2023-04-15 ENCOUNTER — Ambulatory Visit (HOSPITAL_COMMUNITY)
Admission: RE | Admit: 2023-04-15 | Discharge: 2023-04-15 | Disposition: A | Discharge status: Home / Self Care | Source: Ambulatory Visit | Attending: Family Medicine | Admitting: Family Medicine

## 2023-04-15 ENCOUNTER — Ambulatory Visit: Payer: Self-pay | Admitting: *Deleted

## 2023-04-15 VITALS — BP 185/79 | HR 71 | Temp 97.5°F | Resp 16

## 2023-04-15 DIAGNOSIS — R634 Abnormal weight loss: Secondary | ICD-10-CM | POA: Insufficient documentation

## 2023-04-15 DIAGNOSIS — K644 Residual hemorrhoidal skin tags: Secondary | ICD-10-CM | POA: Diagnosis present

## 2023-04-15 DIAGNOSIS — R197 Diarrhea, unspecified: Secondary | ICD-10-CM | POA: Insufficient documentation

## 2023-04-15 LAB — CBC
HCT: 37.7 % (ref 36.0–46.0)
Hemoglobin: 12.4 g/dL (ref 12.0–15.0)
MCH: 33 pg (ref 26.0–34.0)
MCHC: 32.9 g/dL (ref 30.0–36.0)
MCV: 100.3 fL — ABNORMAL HIGH (ref 80.0–100.0)
Platelets: 250 10*3/uL (ref 150–400)
RBC: 3.76 MIL/uL — ABNORMAL LOW (ref 3.87–5.11)
RDW: 13 % (ref 11.5–15.5)
WBC: 5.5 10*3/uL (ref 4.0–10.5)
nRBC: 0 % (ref 0.0–0.2)

## 2023-04-15 LAB — TSH: TSH: 1.194 u[IU]/mL (ref 0.350–4.500)

## 2023-04-15 LAB — COMPREHENSIVE METABOLIC PANEL
ALT: 16 U/L (ref 0–44)
AST: 23 U/L (ref 15–41)
Albumin: 3.8 g/dL (ref 3.5–5.0)
Alkaline Phosphatase: 40 U/L (ref 38–126)
Anion gap: 11 (ref 5–15)
BUN: 14 mg/dL (ref 8–23)
CO2: 26 mmol/L (ref 22–32)
Calcium: 10 mg/dL (ref 8.9–10.3)
Chloride: 104 mmol/L (ref 98–111)
Creatinine, Ser: 0.64 mg/dL (ref 0.44–1.00)
GFR, Estimated: >60 mL/min (ref >60)
Glucose, Bld: 80 mg/dL (ref 70–99)
Potassium: 4.1 mmol/L (ref 3.5–5.1)
Sodium: 141 mmol/L (ref 135–145)
Total Bilirubin: 0.9 mg/dL (ref 0.0–1.2)
Total Protein: 6.3 g/dL — ABNORMAL LOW (ref 6.5–8.1)

## 2023-04-15 NOTE — Discharge Instructions (Signed)
 Please use the hydrocortisone cream for your hemorrhoid.  We have drawn blood to check your blood counts and kidney and liver function and sugar and electrolytes.  Our staff will call you if anything is significantly abnormal  Keep drinking plenty of water like you are.

## 2023-04-15 NOTE — ED Provider Notes (Signed)
 [facility]    CSN: [id number] Arrival date & time: 04/15/23  1249      History   Chief Complaint Chief Complaint  Patient presents with   Hemorrhoids   Constipation    HPI Victoria Long is a 89 y.o. female.    Constipation Here for one episode of diarrhea last night, at which time she noted a knot in her rectal area, like a hemorrhoid. No rectal bleeding. She wanted to come to be evaluated, due to concern for dehydration. She states she has been drinking a lot of water and her urine is clear.  No dizziness.  No nausea or vomiting or fever.  She has lost about 20 pounds over the last 2 years or so.  She used to take Linzess and took it until 1 month ago.  Since then she has changed a little bit of her diet and actually has been having an adequate bowel movement every 1 to 2 days.  She also notes that she is hard of hearing but her ears also probably have a lot of wax as they feel stopped up.  It is been about 1 year since she had an ear lavage.  She is allergic to tramadol, which cause dizziness  Her primary care provider passed away recently and she will be seeing a new primary care on the second.  Past Medical History:  Diagnosis Date   Arthritis    knees   Breast cancer (HCC) 2019   Left Breast   Breast cancer (HCC) 2012   Right Breast   Cancer [facility]) 2012   right breast cancer   Hyperlipidemia    Melanoma (HCC) 2014   right humerus   Personal history of radiation therapy 2012   Right Breast Cancer   Personal history of radiation therapy 2019   Left Breast Cancer   Prediabetes    Vitamin D deficiency     Patient Active Problem List   Diagnosis Date Noted   External hemorrhoids 06/14/2020   Sigmoid diverticulosis 05/02/2020   Senile purpura (HCC) 01/11/2019   Unilateral primary osteoarthritis, right knee 08/13/2018   Atherosclerosis of aorta (HCC) 05/28/2018   Malignant neoplasm of upper-outer quadrant of left breast in female,  estrogen receptor positive (HCC) 07/23/2017   Melanoma in situ of upper arm, right (HCC) 07/23/2017   At high risk for falls 07/23/2017   Invasive ductal carcinoma of breast, female, left (HCC) 07/15/2017   Essential hypertension 10/01/2016   Pancreatic mass 08/22/2016   History of right breast cancer 10/25/2013   Encounter for long-term (current) use of medications 04/19/2013   Hyperlipidemia    Abnormal glucose    Vitamin D deficiency     Past Surgical History:  Procedure Laterality Date   BREAST BIOPSY Right 05/28/2010   BREAST LUMPECTOMY Right 06/29/2010   with radiation no chemo   BREAST LUMPECTOMY Left 08/08/2017   CATARACT EXTRACTION Bilateral 2009   CESAREAN SECTION     MASTECTOMY, PARTIAL Left 07/29/2017   Procedure: LEFT BREAST PARTIAL MASTECTOMY;  Surgeon: Abigail Miyamoto, MD;  Location: [facility];  Service: General;  Laterality: Left;   MELANOMA EXCISION Right 2014   humerus    OB History   No obstetric history on file.      Home Medications    Prior to Admission medications   Medication Sig Start Date End Date Taking? Authorizing Provider  acetaminophen (TYLENOL) 325 MG tablet Take 500 mg by mouth in the morning, at noon,  and at bedtime. 11/04/17  Yes Magrinat, Valentino Hue, MD  calcium carbonate (OS-CAL) 600 MG TABS Take 600 mg by mouth 2 (two) times daily with a meal.    Yes [provider]  Cholecalciferol (VITAMIN D) 1000 UNITS capsule Take 1,000 Units by mouth 2 (two) times daily.    Yes [provider]  famotidine (PEPCID) 20 MG tablet TAKE 1 TABLET BY MOUTH EVERY NIGHT AT BEDTIME 01/06/23  Yes Raynelle Dick, NP  LYSINE HCL PO Take 500 mg by mouth daily.    Yes [provider]  Omega-3 Fatty Acids (FISH OIL PO) Take by mouth daily. Mega Red   Yes [provider]  hydrocortisone (ANUSOL-HC) 2.5 % rectal cream Place 1 Application rectally 2 (two) times daily. 10/29/22   Adela Glimpse, NP  hydrocortisone  (ANUSOL-HC) 25 MG suppository Insert 1 rectal suppository 2 x /day 10/29/22   Adela Glimpse, NP  linaclotide (LINZESS) 145 MCG CAPS capsule Take 1 capsule (145 mcg total) by mouth daily before breakfast. Patient not taking: Reported on 01/27/2023 12/05/22   Adela Glimpse, NP  Multiple Vitamins-Minerals (MULTIVITAMIN PO) Take by mouth daily.    [provider]    Family History Family History  Problem Relation Age of Onset   Arthritis Mother    Rheum arthritis Mother    Hypertension Sister    Cancer Sister        type unknown   Diabetes Maternal Grandmother    Heart attack Brother     Social History Social History   Tobacco Use   Smoking status: Former    Current packs/day: 0.00    Types: Cigarettes    Start date: 08/22/1966    Quit date: 08/21/1976    Years since quitting: 46.6   Smokeless tobacco: Never  Vaping Use   Vaping status: Never Used  Substance Use Topics   Alcohol use: Yes    Alcohol/week: 0.0 - 2.0 standard drinks of alcohol    Comment: wine   Drug use: No     Allergies   Anastrozole and Tramadol   Review of Systems Review of Systems  Gastrointestinal:  Positive for constipation.     Physical Exam Triage Vital Signs ED Triage Vitals  Encounter Vitals Group     BP 04/15/23 1313 (!) 185/79     Systolic BP Percentile --      Diastolic BP Percentile --      Pulse Rate 04/15/23 1313 71     Resp 04/15/23 1313 16     Temp 04/15/23 1313 (!) 97.5 F (36.4 C)     Temp Source 04/15/23 1313 Oral     SpO2 04/15/23 1313 97 %     Weight --      Height --      Head Circumference --      Peak Flow --      Pain Score 04/15/23 1311 0     Pain Loc --      Pain Education --      Exclude from Growth Chart --    No data found.  Updated Vital Signs BP (!) 185/79 (BP Location: Right Arm)   Pulse 71   Temp (!) 97.5 F (36.4 C) (Oral)   Resp 16   SpO2 97%   Visual Acuity Right Eye Distance:   Left Eye Distance:   Bilateral Distance:     Right Eye Near:   Left Eye Near:    Bilateral Near:  Physical Exam Vitals reviewed.  Constitutional:      General: She is not in acute distress.    Appearance: She is not ill-appearing, toxic-appearing or diaphoretic.  HENT:     Ears:     Comments: Bilaterally the ear canals are obstructed by light yellow cerumen, obscuring the view of the tympanic membranes    Nose: Nose normal.     Mouth/Throat:     Mouth: Mucous membranes are moist.  Eyes:     Extraocular Movements: Extraocular movements intact.     Conjunctiva/sclera: Conjunctivae normal.     Pupils: Pupils are equal, round, and reactive to light.  Cardiovascular:     Rate and Rhythm: Normal rate and regular rhythm.     Heart sounds: No murmur heard. Pulmonary:     Effort: Pulmonary effort is normal.     Breath sounds: Normal breath sounds.  Abdominal:     Palpations: Abdomen is soft.     Tenderness: There is no abdominal tenderness.  Genitourinary:    Comments: Chaperone present during exam.  There is a ring of small hemorrhoids under rectum.  No mass.  I did not do a digital exam. Musculoskeletal:     Cervical back: Neck supple.  Lymphadenopathy:     Cervical: No cervical adenopathy.  Skin:    Coloration: Skin is not jaundiced or pale.  Neurological:     General: No focal deficit present.     Mental Status: She is alert and oriented to person, place, and time.  Psychiatric:        Behavior: Behavior normal.      UC Treatments / Results  Labs (all labs ordered are listed, but only abnormal results are displayed) Labs Reviewed  CBC  COMPREHENSIVE METABOLIC PANEL  TSH    EKG   Radiology No results found.  Procedures Procedures (including critical care time)  Medications Ordered in UC Medications - No data to display  Initial Impression / Assessment and Plan / UC Course  I have reviewed the triage vital signs and the nursing notes.  Pertinent labs & imaging results that were available  during my care of the patient were reviewed by me and considered in my medical decision making (see chart for details).     She will restart her hemorrhoid creams.  CBC and CMP are drawn to address her concerns and to evaluate for the weight loss, though it has been slow.   ear lavage was done by staff.    She will keep her follow-up with primary care and she establishes with them in May. Final Clinical Impressions(s) / UC Diagnoses   Final diagnoses:  External hemorrhoid  Diarrhea, unspecified type  Loss of weight     Discharge Instructions      Please use the hydrocortisone cream for your hemorrhoid.  We have drawn blood to check your blood counts and kidney and liver function and sugar and electrolytes.  Our staff will call you if anything is significantly abnormal  Keep drinking plenty of water like you are.       ED Prescriptions   None    PDMP not reviewed this encounter.   Zenia Resides, MD 04/15/23 [phone]

## 2023-04-15 NOTE — ED Triage Notes (Signed)
 Pt states she has had constipation/diarrhea awhile but last night she had diarrhea she notes hemorrhoids now. Her pcp passed away and she is waiting to establish with new pcp. She states she was on linzess but has since stopped this med. She states her hemorrhoids only hurt when she has a BM. She states she has only had one diarrhea BM in the last 24 hours but worried about being dehydrated.  She states she has rectal creams and suppositories she uses as needed but they are only for itching.

## 2023-04-15 NOTE — Telephone Encounter (Signed)
 Patient daughter in law- Maralyn Sago calling (no DPR) information call only Chief Complaint: diarrhea, hemorrhoid flare Symptoms: chronic diarrhea, hemorrhoid pain Frequency: unsure- at least 2 days Pertinent Negatives: Patient denies abdominal pain, vomiting Disposition: [] ED /[x] Urgent Care (no appt availability in office) / [] Appointment(In office/virtual)/ []  [facility]/ [] Home Care/ [] Refused Recommended Disposition /[] [facility]/ []  Follow-up with PCP Additional Notes: Patient information sparce due to caller limited information- no PCP at this time- UC advised - appointment scheduled   Copied from CRM [id number]. Topic: Clinical - Red Word Triage >> Apr 15, 2023  8:25 AM Lennart Pall wrote: Red Word that prompted transfer to Nurse Triage: Patients daughter in law- Maralyn Sago- calling on behalf of patient. Patient has diarrhea and hemmroids.She does not have an appt until May to establish care with a chronic problemnew pcp. Reason for Disposition  Diarrhea is a chronic symptom (recurrent or ongoing AND present > 4 weeks)    Unsure severity- given patient age- recommend UC  Answer Assessment - Initial Assessment Questions 1. DIARRHEA SEVERITY: "How bad is the diarrhea?" "How many more stools have you had in the past 24 hours than normal?"    - NO DIARRHEA (SCALE 0)   - MILD (SCALE 1-3): Few loose or mushy BMs; increase of 1-3 stools over normal daily number of stools; mild increase in ostomy output.   -  MODERATE (SCALE 4-7): Increase of 4-6 stools daily over normal; moderate increase in ostomy output.   -  SEVERE (SCALE 8-10; OR "WORST POSSIBLE"): Increase of 7 or more stools daily over normal; moderate increase in ostomy output; incontinence.     Not sure 2. ONSET: "When did the diarrhea begin?"      Chronic problem- over 1 year, last 2 weeks inflamed hemorrhoids   4. VOMITING: "Are you also vomiting?" If Yes, ask: "How many times in the past 24 hours?"      no 5. ABDOMEN  PAIN: "Are you having any abdomen pain?" If Yes, ask: "What does it feel like?" (e.g., crampy, dull, intermittent, constant)      no  7. ORAL INTAKE: If vomiting, "Have you been able to drink liquids?" "How much liquids have you had in the past 24 hours?"     Patient is eating/drinking normally  11. OTHER SYMPTOMS: "Do you have any other symptoms?" (e.g., fever, blood in stool)       Hemorrhoids  Protocols used: Diarrhea-A-AH

## 2023-04-29 ENCOUNTER — Ambulatory Visit: Payer: Medicare PPO | Admitting: Internal Medicine

## 2023-05-23 ENCOUNTER — Encounter: Payer: Self-pay | Admitting: Family Medicine

## 2023-05-23 ENCOUNTER — Ambulatory Visit: Payer: Medicare PPO | Admitting: Family Medicine

## 2023-05-23 VITALS — BP 146/80 | HR 70 | Temp 97.8°F | Ht 61.0 in | Wt 105.7 lb

## 2023-05-23 DIAGNOSIS — D649 Anemia, unspecified: Secondary | ICD-10-CM

## 2023-05-23 DIAGNOSIS — I10 Essential (primary) hypertension: Secondary | ICD-10-CM | POA: Diagnosis not present

## 2023-05-23 DIAGNOSIS — K644 Residual hemorrhoidal skin tags: Secondary | ICD-10-CM | POA: Diagnosis not present

## 2023-05-23 DIAGNOSIS — K2100 Gastro-esophageal reflux disease with esophagitis, without bleeding: Secondary | ICD-10-CM | POA: Diagnosis not present

## 2023-05-23 LAB — VITAMIN B12: Vitamin B-12: 677 pg/mL (ref 211–911)

## 2023-05-23 LAB — FOLATE: Folate: >25.2 ng/mL (ref >5.9)

## 2023-05-23 MED ORDER — HYDROCORTISONE (PERIANAL) 2.5 % EX CREA: 1.0000 | TOPICAL_CREAM | Freq: Two times a day (BID) | CUTANEOUS | 5 refills | Status: DC

## 2023-05-23 MED ORDER — FAMOTIDINE 20 MG PO TABS: ORAL_TABLET | ORAL | 1 refills | Status: DC

## 2023-05-23 NOTE — Progress Notes (Unsigned)
 New Patient Office Visit  Subjective   Patient ID: Victoria Long, female    DOB: [birth year]  Age: 89 y.o. MRN: [id number]  CC:  Chief Complaint  Patient presents with  . Establish Care    HPI Victoria Long presents to establish care Pt is a former pt of Dr. Cassondra Cliff, he recently passed away. States that she has chronic knee pain and was supposed to have a knee replacement, states she takes 4 tylenol  daily   Patient is also reporting a history of hemorrhoids, had recently gone to urgent care and they recommended continuing the anusol  rectal cream, but she states that it burns when she applies the cream. States that she is chronic ally constipated. Used to take linzess  but felt like it would give her diarrhea. States she stopped the linzess  and it seems that her bowels are working themselves out.     Current Outpatient Medications  Medication Instructions  . acetaminophen  (TYLENOL ) 500 mg, 3 times daily  . calcium  carbonate (OS-CAL) 600 mg, 2 times daily with meals  . famotidine  (PEPCID ) 20 MG tablet TAKE 1 TABLET BY MOUTH EVERY NIGHT AT BEDTIME  . hydrocortisone  (ANUSOL -HC) 2.5 % rectal cream 1 Application, Rectal, 2 times daily  . LYSINE HCL PO 500 mg, Daily  . Multiple Vitamins-Minerals (MULTIVITAMIN PO) Daily  . Vitamin D  1,000 Units, 2 times daily    Past Medical History:  Diagnosis Date  . Arthritis    knees  . Breast cancer (HCC) 2019   Left Breast  . Breast cancer (HCC) 2012   Right Breast  . Cancer (HCC) 2012   right breast cancer  . Hyperlipidemia   . Melanoma (HCC) 2014   right humerus  . Personal history of radiation therapy 2012   Right Breast Cancer  . Personal history of radiation therapy 2019   Left Breast Cancer  . Prediabetes   . Vitamin D  deficiency     Past Surgical History:  Procedure Laterality Date  . BREAST BIOPSY Right 05/28/2010  . BREAST LUMPECTOMY Right 06/29/2010   with radiation no chemo  . BREAST LUMPECTOMY Left  08/08/2017  . CATARACT EXTRACTION Bilateral 2009  . CESAREAN SECTION    . MASTECTOMY, PARTIAL Left 07/29/2017   Procedure: LEFT BREAST PARTIAL MASTECTOMY;  Surgeon: Oza Blumenthal, MD;  Location: [facility];  Service: General;  Laterality: Left;  Aaron Aas MELANOMA EXCISION Right 2014   humerus    Family History  Problem Relation Age of Onset  . Arthritis Mother   . Rheum arthritis Mother   . Hypertension Sister   . Cancer Sister        type unknown  . Diabetes Maternal Grandmother   . Heart attack Brother     Social History   Socioeconomic History  . Marital status: Widowed    Spouse name: Not on file  . Number of children: 4  . Years of education: Not on file  . Highest education level: Not on file  Occupational History  . Not on file  Tobacco Use  . Smoking status: Former    Current packs/day: 0.00    Types: Cigarettes    Start date: 08/22/1966    Quit date: 08/21/1976    Years since quitting: 46.7  . Smokeless tobacco: Never  Vaping Use  . Vaping status: Never Used  Substance and Sexual Activity  . Alcohol use: Yes    Alcohol/week: 0.0 - 2.0 standard drinks of alcohol    Comment: wine  .  Drug use: No  . Sexual activity: Not Currently    Birth control/protection: Post-menopausal  Other Topics Concern  . Not on file  Social History Narrative  . Not on file   Social Drivers of Health   Financial Resource Strain: Not on file  Food Insecurity: Not on file  Transportation Needs: Not on file  Physical Activity: Not on file  Stress: Not on file  Social Connections: Not on file  Intimate Partner Violence: Not on file    Review of Systems  All other systems reviewed and are negative.       Objective   BP (!) 146/80   Pulse 70   Temp 97.8 F (36.6 C) (Oral)   Ht 5\' 1"  (1.549 m)   Wt 105 lb 11.2 oz (47.9 kg)   SpO2 100%   BMI 19.97 kg/m   Physical Exam Vitals reviewed.  Constitutional:      Appearance: Normal appearance. She is  well-groomed and normal weight.  Cardiovascular:     Rate and Rhythm: Normal rate and regular rhythm.     Pulses: Normal pulses.     Heart sounds: S1 normal and S2 normal.  Pulmonary:     Effort: Pulmonary effort is normal.     Breath sounds: Normal breath sounds and air entry.  Abdominal:     General: Bowel sounds are normal.  Musculoskeletal:     Right lower leg: No edema.     Left lower leg: No edema.  Neurological:     Mental Status: She is alert and oriented to person, place, and time. Mental status is at baseline.     Gait: Gait is intact.  Psychiatric:        Mood and Affect: Mood and affect normal.        Speech: Speech normal.        Behavior: Behavior normal.        Judgment: Judgment normal.    {Labs (Optional):23779}    Assessment & Plan:  External hemorrhoids -     Hydrocortisone  (Perianal); Place 1 Application rectally 2 (two) times daily.  Dispense: 30 g; Refill: 5  Gastroesophageal reflux disease with esophagitis without hemorrhage -     Famotidine ; TAKE 1 TABLET BY MOUTH EVERY NIGHT AT BEDTIME  Dispense: 90 tablet; Refill: 1    Return in about 6 months (around 11/23/2023).   Aida House, MD

## 2023-05-23 NOTE — Patient Instructions (Addendum)
 Tetanus booster and pneumonia vaccine needs updated in July -- prevnar-20  Naproxen -- 220 mg tablets-- start with 1 tablet twice a day as needed, may increase to 2 tablets twice a day as needed for knee pain-- take with food.   1 Packet of metamucil daily-- try to get 35 grams of fiber  Take a probiotic -- start with once daily, can go up to twice daily with meals  1 scoop of miralax daily  Folic acid 1 mg daily  Vitamin B12 -- at least 250 mcg up to 500 mcg daily  Start checking blood pressures at home once a day -- your goal blood pressure is anything less than 140/90 -- if not then please call me and we can talk about possibly needing blood pressure medication

## 2023-05-26 NOTE — Assessment & Plan Note (Signed)
 Pt has a diagnosis of this however she is not currently on medication for this, BP is elevated in office today. I advised we monitor and if it remains elevated we can discuss BP medication at the next visit.

## 2023-05-26 NOTE — Assessment & Plan Note (Signed)
 Will continue the hydrocortisone  cream for the patient. I spent 30 minutes with the patient discussing a good bowel regimen consisting of fiber, probiotics and miralax daily or as needed. This will help reduce constipation and irritation on the hemorrhoid. If no improvement then I would recommend a referral to GI

## 2023-06-10 ENCOUNTER — Telehealth: Payer: Self-pay | Admitting: Family Medicine

## 2023-06-10 NOTE — Telephone Encounter (Unsigned)
 Copied from CRM [id number]. Topic: Clinical - Medical Advice >> Jun 10, 2023  4:44 PM Victoria Long P wrote: Reason for CRM: Pt would like to know if she come come into the office for 5 days a week to have BP monitor/check, would like to know how much would it be if it cost - pt daughter in law Victoria Long can be reach at [phone]

## 2023-06-17 ENCOUNTER — Telehealth: Payer: Self-pay | Admitting: *Deleted

## 2023-06-17 ENCOUNTER — Telehealth: Payer: Self-pay | Admitting: Family Medicine

## 2023-06-17 DIAGNOSIS — K644 Residual hemorrhoidal skin tags: Secondary | ICD-10-CM

## 2023-06-17 NOTE — Telephone Encounter (Signed)
 Spoke with the patient's daughter and offered to schedule a nurse's visit for a blood pressure check as below.  She asked if there is a charge for this, was advised there is a charge and I am not sure of the exact cost.  She stated the patient is wanting to have her blood pressure checked every day and was advised to contact the [facility] pharmacy on [address] or the closest fire department if she prefers.  She was also advised a refill for Hydrocortisone  rectal cream was [facility] to [facility] on [facility]2 with 5 refills.

## 2023-06-17 NOTE — Telephone Encounter (Signed)
 Copied from CRM [id number]. Topic: Clinical - Medical Advice >> Jun 17, 2023 11:43 AM Dimple Francis wrote: Reason for CRM: Patient is unable to use her BP cuff due to her arm being so small, she is wanting to know if she can come in for the next few days to have it checked. Please reach out to daughter in [place] [facility] # [phone]

## 2023-06-17 NOTE — Telephone Encounter (Unsigned)
 Copied from CRM [id number]. Topic: Clinical - Medication Refill >> Jun 17, 2023 11:42 AM Dimple Francis wrote: Medication: hydrocortisone  (ANUSOL -HC) 2.5 % rectal cream  Has the patient contacted their pharmacy? Yes (Agent: If no, request that the patient contact the pharmacy for the refill. If patient does not wish to contact the pharmacy document the reason why and proceed with request.) (Agent: If yes, when and what did the pharmacy advise?)  This is the patient's preferred pharmacy:  [facility] [id number] - [place], [place] - [address] [place] [address] Phone: [phone] Fax: [phone]  Is this the correct pharmacy for this prescription? Yes If no, delete pharmacy and type the correct one.   Has the prescription been filled recently? Yes  Is the patient out of the medication? Yes  Has the patient been seen for an appointment in the last year OR does the patient have an upcoming appointment? Yes  Can we respond through MyChart? Yes  Agent: Please be advised that Rx refills may take up to 3 business days. We ask that you follow-up with your pharmacy.

## 2023-06-17 NOTE — Telephone Encounter (Signed)
 Ok to set up a nursing visit for BP check

## 2023-06-18 MED ORDER — HYDROCORTISONE (PERIANAL) 2.5 % EX CREA: 1.0000 | TOPICAL_CREAM | Freq: Two times a day (BID) | CUTANEOUS | 5 refills | Status: AC

## 2023-06-19 ENCOUNTER — Encounter: Payer: Medicare PPO | Admitting: Nurse Practitioner

## 2023-06-20 ENCOUNTER — Other Ambulatory Visit (HOSPITAL_COMMUNITY): Payer: Self-pay

## 2023-06-20 ENCOUNTER — Telehealth: Payer: Self-pay

## 2023-06-20 NOTE — Telephone Encounter (Signed)
 Pharmacy Patient Advocate Encounter   Received notification from CoverMyMeds that prior authorization for Hydrocort -Pramoxine (Perianal) 2.5-1% cream is required/requested.   WAS CANCEL PROVIDER SENT NEW SCRIPT THAT WAS COVER PROTOSOL CREA

## 2023-07-04 ENCOUNTER — Telehealth: Payer: Self-pay

## 2023-07-04 NOTE — Telephone Encounter (Signed)
 Copied from CRM [id number]. Topic: General - Other >> Jul 04, 2023 11:00 AM Annelle Kiel wrote: Reason for CRM: patient has Hemorrhoids and she has done everything thr dr has told her to do she is still not feeling any relief patient daughter inlaw would like a call back regarding  this

## 2023-07-07 ENCOUNTER — Encounter: Payer: Self-pay | Admitting: Family Medicine

## 2023-07-07 ENCOUNTER — Ambulatory Visit: Payer: Self-pay

## 2023-07-07 ENCOUNTER — Other Ambulatory Visit (HOSPITAL_COMMUNITY): Payer: Self-pay

## 2023-07-07 ENCOUNTER — Telehealth: Payer: Self-pay

## 2023-07-07 ENCOUNTER — Ambulatory Visit (INDEPENDENT_AMBULATORY_CARE_PROVIDER_SITE_OTHER): Admitting: Family Medicine

## 2023-07-07 VITALS — BP 160/70 | HR 72 | Temp 97.7°F | Ht 61.0 in

## 2023-07-07 DIAGNOSIS — I10 Essential (primary) hypertension: Secondary | ICD-10-CM | POA: Diagnosis not present

## 2023-07-07 DIAGNOSIS — M1711 Unilateral primary osteoarthritis, right knee: Secondary | ICD-10-CM | POA: Diagnosis not present

## 2023-07-07 DIAGNOSIS — K644 Residual hemorrhoidal skin tags: Secondary | ICD-10-CM | POA: Diagnosis not present

## 2023-07-07 MED ORDER — LIDOCAINE 5 % EX OINT: 1.0000 | TOPICAL_OINTMENT | Freq: Four times a day (QID) | CUTANEOUS | 2 refills | Status: DC | PRN

## 2023-07-07 MED ORDER — OLMESARTAN MEDOXOMIL 20 MG PO TABS: 20.0000 mg | ORAL_TABLET | Freq: Every day | ORAL | 1 refills | Status: DC

## 2023-07-07 MED ORDER — HYDROCORTISONE ACETATE 25 MG RE SUPP: 25.0000 mg | Freq: Two times a day (BID) | RECTAL | 2 refills | Status: DC

## 2023-07-07 NOTE — Patient Instructions (Addendum)
 Switching to hydrocortisone  suppositories -- may take this twice a day  Use the lidocaine  ointment up to four times a day for pain relief-- use for up to 1 week then go back to the hydrocortisone  cream  Also can use preparation H as well with the other creams and suppositories

## 2023-07-07 NOTE — Telephone Encounter (Signed)
 Looks like I recommended referral to GI-- if they are agreeable to the referral ok to send to GI for the hemorrhoids

## 2023-07-07 NOTE — Telephone Encounter (Signed)
 FYI Only or Action Required?: FYI only for provider  Patient was last seen in primary care on 05/23/2023 by Aida House, MD. Called Nurse Triage reporting Diarrhea. Symptoms began several months ago. Interventions attempted: Nothing. Symptoms are: unchanged.  Triage Disposition: See PCP When Office is Open (Within 3 Days)  Patient/caregiver understands and will follow disposition?: Yes    Reason for Disposition  [1] Home treatment > 3 days for rectal pain AND [2] not improved  Answer Assessment - Initial Assessment Questions 1. SYMPTOM:  What's the main symptom you're concerned about? (e.g., pain, itching, swelling, rash)     Pain, swelling 2. ONSET: When did the   start?     ongoing 3. RECTAL PAIN: Do you have any pain around your rectum? How bad is the pain?  (Scale 0-10; or mild, moderate, severe)   - NONE (0): no pain   - MILD (1-3): doesn't interfere with normal activities    - MODERATE (4-7): interferes with normal activities or awakens from sleep, limping    - SEVERE (8-10): excruciating pain, unable to have a bowel movement      mild 4. RECTAL ITCHING: Do you have any itching in this area? How bad is the itching?  (Scale 0-10; or mild, moderate, severe)   - NONE: no itching   - MILD: doesn't interfere with normal activities    - MODERATE-SEVERE: interferes with normal activities or awakens from sleep     N/a 5. CONSTIPATION: Do you have constipation? If Yes, ask: How bad is it?     no 6. CAUSE: What do you think is causing the anus symptoms?     Diarrhea and hemorrhoids 7. OTHER SYMPTOMS: Do you have any other symptoms?  (e.g., abdomen pain, fever, rectal bleeding, vomiting)      8. PREGNANCY: Is there any chance you are pregnant? When was your last menstrual period?     N/a  Answer Assessment - Initial Assessment Questions 1. DIARRHEA SEVERITY: How bad is the diarrhea? How many more stools have you had in the past 24 hours than normal?     - NO DIARRHEA (SCALE 0)   - MILD (SCALE 1-3): Few loose or mushy BMs; increase of 1-3 stools over normal daily number of stools; mild increase in ostomy output.   -  MODERATE (SCALE 4-7): Increase of 4-6 stools daily over normal; moderate increase in ostomy output.   -  SEVERE (SCALE 8-10; OR WORST POSSIBLE): Increase of 7 or more stools daily over normal; moderate increase in ostomy output; incontinence.     moderate 2. ONSET: When did the diarrhea begin?      yesterday 3. BM CONSISTENCY: How loose or watery is the diarrhea?      loose 4. VOMITING: Are you also vomiting? If Yes, ask: How many times in the past 24 hours?      no 5. ABDOMEN PAIN: Are you having any abdomen pain? If Yes, ask: What does it feel like? (e.g., crampy, dull, intermittent, constant)      yes 6. ABDOMEN PAIN SEVERITY: If present, ask: How bad is the pain?  (e.g., Scale 1-10; mild, moderate, or severe)   - MILD (1-3): doesn't interfere with normal activities, abdomen soft and not tender to touch    - MODERATE (4-7): interferes with normal activities or awakens from sleep, abdomen tender to touch    - SEVERE (8-10): excruciating pain, doubled over, unable to do any normal activities  mild 7. ORAL INTAKE: If vomiting, Have you been able to drink liquids? How much liquids have you had in the past 24 hours?     N/a 8. HYDRATION: Any signs of dehydration? (e.g., dry mouth [not just dry lips], too weak to stand, dizziness, new weight loss) When did you last urinate?     Weak 9. EXPOSURE: Have you traveled to a foreign country recently? Have you been exposed to anyone with diarrhea? Could you have eaten any food that was spoiled?     N/a 10. ANTIBIOTIC USE: Are you taking antibiotics now or have you taken antibiotics in the past 2 months?       no 11. OTHER SYMPTOMS: Do you have any other symptoms? (e.g., fever, blood in stool)       no 12. PREGNANCY: Is there any chance you are  pregnant? When was your last menstrual period?       N/a  Protocols used: Diarrhea-A-AH, Rectal Symptoms-A-AH

## 2023-07-07 NOTE — Telephone Encounter (Signed)
 Copied from CRM [id number]. Topic: Clinical - Pink Word Triage >> Jul 07, 2023  8:37 AM Victoria Long wrote: Patient called in with pink word: Diarrhea and issues with hemorrhoids.  See nurse triage

## 2023-07-07 NOTE — Telephone Encounter (Signed)
 Pharmacy Patient Advocate Encounter   Received notification from CoverMyMeds that prior authorization for Lidocaine  5% ointment is required/requested.   Insurance verification completed.   The patient is insured through [employer] .   Per test claim: PA required; PA submitted to above mentioned insurance via CoverMyMeds Key/confirmation #/EOC (Key: [id number])   Status is pending

## 2023-07-07 NOTE — Progress Notes (Signed)
 Acute Office Visit  Subjective:     Patient ID: Victoria Long, female    DOB: 1928-01-22, 89 y.o.   MRN: [id number]  Chief Complaint  Patient presents with   Diarrhea    Constant x1 day, has not tried any medications to stop diarrhea    HTN -- BP in office performed and is elevated in office. She  reports no side effects to the medications, no chest pain, SOB, dizziness or headaches. She has a BP cuff at home. We discussed adding medication today to help control her blood pressure and she is agreeable. I discussed the risks/benefits of medication options, will start with olmesartan 20 mg daily.   OA of knees-- pt reports she is having more difficulty with ambulation, we discussed physical therapy and she is agreeable.       Patient is in today for continued hemorrhoidal pain. States that up until this point she was having regular BM's, has been using metamucil cookies and is taking her miralax in her coffee, however last night had an episode of diarrhea, had about 5-6 episodes over night last night after dinner. She denies any nausea, no fever or chills, no blood in the stool, states it was mostly clear. No abdominal pain with the diarrhea.   Review of Systems  All other systems reviewed and are negative.       Objective:    BP (!) 160/70   Pulse 72   Temp 97.7 F (36.5 C) (Oral)   Ht 5' 1 (1.549 m)   SpO2 98%   BMI 19.97 kg/m    Physical Exam Exam conducted with a chaperone present.  Constitutional:      Appearance: Normal appearance. She is normal weight.   Cardiovascular:     Rate and Rhythm: Regular rhythm.     Heart sounds: Normal heart sounds.  Pulmonary:     Effort: Pulmonary effort is normal.     Breath sounds: Normal breath sounds. No wheezing.  Abdominal:     General: There is no distension.     Palpations: Abdomen is soft. There is no mass.     Tenderness: There is no guarding or rebound.  Genitourinary:    Rectum: External hemorrhoid  (multiple inflamed hemorrhoids all around the rectum, some with mild bleeding) present.   Neurological:     Mental Status: She is alert.     No results found for any visits on 07/07/23.      Assessment & Plan:   Problem List Items Addressed This Visit       Unprioritized   Essential hypertension   This is the second visit in a row with elevated BP, I recommend starting olmesartan once daily. Pt agreeable. RTC in 3 months for BP check      Relevant Medications   olmesartan (BENICAR) 20 MG tablet   Other Relevant Orders   Ambulatory referral to Home Health   Unilateral primary osteoarthritis, right knee   Difficulty with ambulation, will order home health for PT      Relevant Orders   Ambulatory referral to Home Health   External hemorrhoids - Primary   Pt has been using the bowel regimen however the hemorrhoids are not improving, extremely painful and bleeding occasionally. I will switch her to the suppositories and send her to GI for further recommendations lidocain ointment also given for pain management.       Relevant Medications   olmesartan (BENICAR) 20 MG tablet   hydrocortisone  (ANUSOL -HC)  25 MG suppository   lidocaine  (XYLOCAINE ) 5 % ointment   Other Relevant Orders   Ambulatory referral to Gastroenterology    Meds ordered this encounter  Medications   olmesartan (BENICAR) 20 MG tablet    Sig: Take 1 tablet (20 mg total) by mouth daily.    Dispense:  90 tablet    Refill:  1   hydrocortisone  (ANUSOL -HC) 25 MG suppository    Sig: Place 1 suppository (25 mg total) rectally 2 (two) times daily.    Dispense:  12 suppository    Refill:  2   lidocaine  (XYLOCAINE ) 5 % ointment    Sig: Apply 1 Application topically 4 (four) times daily as needed.    Dispense:  50 g    Refill:  2    For external hemorrhoidal pain for about 7 days at a time    Return in about 3 months (around 10/07/2023) for HTN.  Heron CHRISTELLA Sharper, MD

## 2023-07-07 NOTE — Telephone Encounter (Signed)
 Noted- ok to close.

## 2023-07-07 NOTE — Telephone Encounter (Signed)
 Patient was scheduled for an appt today at 3:30pm with PCP.

## 2023-07-07 NOTE — Telephone Encounter (Signed)
 Patient was seen today by PCP and referral placed during OV.

## 2023-07-08 ENCOUNTER — Other Ambulatory Visit (HOSPITAL_COMMUNITY): Payer: Self-pay

## 2023-07-08 NOTE — Telephone Encounter (Signed)
 Pharmacy Patient Advocate Encounter  Received notification from [employer] that Prior Authorization for Lidocaine  5% ointment has been APPROVED from 1.1.25 to 12.31.25. Ran test claim, Copay is $10.00. This test claim was processed through [facility]- copay amounts may vary at other pharmacies due to pharmacy/plan contracts, or as the patient moves through the different stages of their insurance plan.   PA #/Case ID/Reference #: (Key: [id number])

## 2023-07-10 ENCOUNTER — Other Ambulatory Visit (HOSPITAL_COMMUNITY): Payer: Self-pay

## 2023-07-16 NOTE — Assessment & Plan Note (Signed)
 Difficulty with ambulation, will order home health for PT

## 2023-07-16 NOTE — Assessment & Plan Note (Signed)
 Pt has been using the bowel regimen however the hemorrhoids are not improving, extremely painful and bleeding occasionally. I will switch her to the suppositories and send her to GI for further recommendations lidocain ointment also given for pain management.

## 2023-07-16 NOTE — Assessment & Plan Note (Signed)
 This is the second visit in a row with elevated BP, I recommend starting olmesartan once daily. Pt agreeable. RTC in 3 months for BP check

## 2023-07-30 ENCOUNTER — Encounter: Payer: Medicare PPO | Admitting: Nurse Practitioner

## 2023-08-04 ENCOUNTER — Other Ambulatory Visit (HOSPITAL_COMMUNITY): Payer: Self-pay

## 2023-08-12 ENCOUNTER — Telehealth: Payer: Self-pay | Admitting: *Deleted

## 2023-08-12 NOTE — Telephone Encounter (Signed)
 Copied from CRM [id number]. Topic: General - Other >> Aug 12, 2023 11:40 AM Rosina BIRCH wrote: Reason for CRM: Caitlin from [facility] called wanting to see if we received some orders on yesterday for the patient [phone]

## 2023-08-18 ENCOUNTER — Telehealth: Payer: Self-pay | Admitting: *Deleted

## 2023-08-18 NOTE — Telephone Encounter (Signed)
 Noted- ok to close.

## 2023-08-18 NOTE — Telephone Encounter (Signed)
 Received via e-fax, signed by PCP, faxed to [facility] at [phone] and sent to be scanned.

## 2023-08-18 NOTE — Telephone Encounter (Signed)
 Copied from CRM #[id number]. Topic: Clinical - Home Health Verbal Orders >> Aug 15, 2023 11:41 AM Drema MATSU wrote: Caller/Agency: [facility] Callback Number: [phone] Jovita wants to let the doctor know hat she spoke with the daughter yesterday night regarding the home health Speech Therapy evaluation. Jovita stated that daughter declined Speech Therapy evaluation.

## 2023-08-18 NOTE — Telephone Encounter (Signed)
 Victoria Long - [facility] called to follow up on a document that was faxed on 07/25.  Order #: [id number] Spoke with Rep at the front desk. She asked to have the document refaxed today.

## 2023-08-28 DIAGNOSIS — Z85828 Personal history of other malignant neoplasm of skin: Secondary | ICD-10-CM | POA: Diagnosis not present

## 2023-08-28 DIAGNOSIS — C44329 Squamous cell carcinoma of skin of other parts of face: Secondary | ICD-10-CM | POA: Diagnosis not present

## 2023-08-28 DIAGNOSIS — D485 Neoplasm of uncertain behavior of skin: Secondary | ICD-10-CM | POA: Diagnosis not present

## 2023-08-28 DIAGNOSIS — Z85820 Personal history of malignant melanoma of skin: Secondary | ICD-10-CM | POA: Diagnosis not present

## 2023-08-28 DIAGNOSIS — L570 Actinic keratosis: Secondary | ICD-10-CM | POA: Diagnosis not present

## 2023-09-02 ENCOUNTER — Telehealth: Payer: Self-pay | Admitting: *Deleted

## 2023-09-02 NOTE — Telephone Encounter (Signed)
 Placed in the box for PCP to review and once signed this will be faxed.

## 2023-09-02 NOTE — Telephone Encounter (Signed)
 Copied from CRM [id number]. Topic: Clinical - Home Health Verbal Orders >> Sep 02, 2023 12:22 PM Drema MATSU wrote: Victoria Long wants to know if we recieved home health orders to move a discharge visit to next week that was sent on yesterday. Order # [id number]

## 2023-09-07 NOTE — Progress Notes (Unsigned)
 Victoria Long [id number] [birth year]   Chief Complaint:  Referring Provider: Ozell Heron HERO, MD Primary GI MD: Dr. Avram  HPI: Victoria Long is a 89 y.o. female with past medical history of arthritis, bilateral breast cancer s/p right lumpectomy 2012, left lumpectomy/partial mastectomy 2019, melanoma s/p excision 2014, HLD, prediabetes, vitamin D  deficiency who presents today for a complaint of external hemorrhoids.    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  No prior abdominal imaging for comparison, no GI complaints.  Appetite and weight stable.  Normal bowel movements.  Also found to have a fracture of L1-L2 spinous process on that CT.  Pain improving.  Further imaging with MRI was recommended, and depending on findings EUS with FNA.  Invasive left breast cancer was discovered 06/2017.  She ultimately opted for observation regarding pancreatic mass and did not desire further testing or treatment.  She was seen in urgent care 04/15/2023 for complaint of diarrhea and a knot in her rectal area like a hemorrhoid.  Endorsed 20 pounds weight loss over the previous 2 years.  Has been taking Linzess  until about a month prior, then changed her diet and was having an adequate bowel movement every 1 to 2 days.  On exam noted to have a ring of small hemorrhoids under the rectum, no mass, no digital exam performed.  She was started on hydrocortisone  cream for hemorrhoids.  Seen by PCP 05/23/2023 and noted to have microcytic anemia and GERD symptoms, and was started on famotidine .  Advised to continue hydrocortisone  cream for hemorrhoids as well as fiber supplement, probiotics, and daily MiraLAX as needed.  As of 07/07/2023 hemorrhoids had not improved and had  been extremely painful with occasional bleeding.  She was switched to suppositories and topical lidocaine  ointment and referred to GI for further evaluation.  Previous GI Procedures/Imaging   Abdominal x-ray 12/06/2021 Nonobstructive pattern of bowel gas. Large burden of stool and stool balls throughout the colon and rectum.   Past Medical History:  Diagnosis Date   Arthritis    knees   Breast cancer (HCC) 2019   Left Breast   Breast cancer (HCC) 2012   Right Breast   Cancer [facility]) 2012   right breast cancer   Hyperlipidemia    Melanoma (HCC) 2014   right humerus   Personal history of radiation therapy 2012   Right Breast Cancer   Personal history of radiation therapy 2019   Left Breast Cancer   Prediabetes    Vitamin D  deficiency     Past Surgical History:  Procedure Laterality Date   BREAST BIOPSY Right 05/28/2010   BREAST LUMPECTOMY Right 06/29/2010   with radiation no chemo   BREAST LUMPECTOMY Left 08/08/2017   CATARACT EXTRACTION Bilateral 2009   CESAREAN SECTION     MASTECTOMY, PARTIAL Left 07/29/2017   Procedure: LEFT BREAST PARTIAL MASTECTOMY;  Surgeon: Vernetta Berg, MD;  Location: [facility];  Service: General;  Laterality: Left;   MELANOMA EXCISION Right 2014   humerus    Current Outpatient Medications  Medication Sig Dispense Refill   acetaminophen  (TYLENOL ) 325 MG tablet Take 500 mg by mouth in the morning, at noon, and at bedtime.     calcium  carbonate (OS-CAL) 600 MG TABS Take 600 mg by  mouth 2 (two) times daily with a meal.      Cholecalciferol (VITAMIN D ) 1000 UNITS capsule Take 1,000 Units by mouth 2 (two) times daily.      famotidine  (PEPCID ) 20 MG tablet TAKE 1 TABLET BY MOUTH EVERY NIGHT AT BEDTIME 90 tablet 1   hydrocortisone  (ANUSOL -HC) 2.5 % rectal cream Place 1 Application rectally 2 (two) times daily. 30 g 5   hydrocortisone  (ANUSOL -HC) 25 MG suppository Place 1 suppository (25 mg total) rectally 2 (two) times daily. 12  suppository 2   lidocaine  (XYLOCAINE ) 5 % ointment Apply 1 Application topically 4 (four) times daily as needed. 50 g 2   LYSINE HCL PO Take 500 mg by mouth daily.      Multiple Vitamins-Minerals (MULTIVITAMIN PO) Take by mouth daily.     olmesartan  (BENICAR ) 20 MG tablet Take 1 tablet (20 mg total) by mouth daily. 90 tablet 1   No current facility-administered medications for this visit.    Allergies as of 09/08/2023 - Review Complete 07/07/2023  Allergen Reaction Noted   Anastrozole   09/16/2017   Tramadol   06/12/2016    Family History  Problem Relation Age of Onset   Arthritis Mother    Rheum arthritis Mother    Hypertension Sister    Cancer Sister        type unknown   Diabetes Maternal Grandmother    Heart attack Brother     Social History   Tobacco Use   Smoking status: Former    Current packs/day: 0.00    Types: Cigarettes    Start date: 08/22/1966    Quit date: 08/21/1976    Years since quitting: 47.0   Smokeless tobacco: Never  Vaping Use   Vaping status: Never Used  Substance Use Topics   Alcohol use: Yes    Alcohol/week: 0.0 - 2.0 standard drinks of alcohol    Comment: wine   Drug use: No     Review of Systems:    Constitutional: No weight loss, fever, chills, weakness or fatigue Eyes: No change in vision Ears, Nose, Throat:  No change in hearing or congestion Skin: No rash or itching Cardiovascular: No chest pain, chest pressure or palpitations   Respiratory: No SOB or cough Gastrointestinal: See HPI and otherwise negative Genitourinary: No dysuria or change in urinary frequency Neurological: No headache, dizziness or syncope Musculoskeletal: No new muscle or joint pain Hematologic: No bleeding or bruising    Physical Exam:  Vital signs: There were no vitals taken for this visit.  Constitutional: NAD, Well developed, Well nourished, alert and cooperative Head:  Normocephalic and atraumatic.  Eyes: No scleral icterus. Conjunctiva pink. Mouth: No  oral lesions. Respiratory: Respirations even and unlabored. Lungs clear to auscultation bilaterally.  No wheezes, crackles, or rhonchi.  Cardiovascular:  Regular rate and rhythm. No murmurs. No peripheral edema. Gastrointestinal:  Soft, nondistended, nontender. No rebound or guarding. Normal bowel sounds. No appreciable masses or hepatomegaly. Rectal:  Not performed.  Neurologic:  Alert and oriented x4;  grossly normal neurologically.  Skin:   Dry and intact without significant lesions or rashes. Psychiatric: Oriented to person, place and time. Demonstrates good judgement and reason without abnormal affect or behaviors.   RELEVANT LABS AND IMAGING: CBC    Component Value Date/Time   WBC 5.5 04/15/2023 1354   RBC 3.76 (L) 04/15/2023 1354   HGB 12.4 04/15/2023 1354   HGB 12.7 07/23/2017 1510   HGB 12.4 10/24/2014 1426   HCT 37.7 04/15/2023 1354  HCT 37.0 10/24/2014 1426   PLT 250 04/15/2023 1354   PLT 227 07/23/2017 1510   PLT 226 10/24/2014 1426   MCV 100.3 (H) 04/15/2023 1354   MCV 94.9 10/24/2014 1426   MCH 33.0 04/15/2023 1354   MCHC 32.9 04/15/2023 1354   RDW 13.0 04/15/2023 1354   RDW 13.3 10/24/2014 1426   LYMPHSABS 1,810 10/17/2022 1603   LYMPHSABS 1.4 10/24/2014 1426   MONOABS 0.5 07/23/2017 1510   MONOABS 0.5 10/24/2014 1426   EOSABS 99 10/17/2022 1603   EOSABS 0.1 10/24/2014 1426   BASOSABS 37 10/17/2022 1603   BASOSABS 0.0 10/24/2014 1426    CMP     Component Value Date/Time   NA 141 04/15/2023 1354   NA 144 10/24/2014 1426   K 4.1 04/15/2023 1354   K 4.1 10/24/2014 1426   CL 104 04/15/2023 1354   CL 107 06/08/2012 0851   CO2 26 04/15/2023 1354   CO2 29 10/24/2014 1426   GLUCOSE 80 04/15/2023 1354   GLUCOSE 114 10/24/2014 1426   GLUCOSE 107 (H) 06/08/2012 0851   BUN 14 04/15/2023 1354   BUN 13.1 10/24/2014 1426   CREATININE 0.64 04/15/2023 1354   CREATININE 0.84 10/17/2022 1603   CREATININE 0.7 10/24/2014 1426   CALCIUM  10.0 04/15/2023 1354    CALCIUM  9.3 10/24/2014 1426   PROT 6.3 (L) 04/15/2023 1354   PROT 6.1 (L) 10/24/2014 1426   ALBUMIN 3.8 04/15/2023 1354   ALBUMIN 3.5 10/24/2014 1426   AST 23 04/15/2023 1354   AST 19 07/23/2017 1510   AST 18 10/24/2014 1426   ALT 16 04/15/2023 1354   ALT 18 07/23/2017 1510   ALT 16 10/24/2014 1426   ALKPHOS 40 04/15/2023 1354   ALKPHOS 54 10/24/2014 1426   BILITOT 0.9 04/15/2023 1354   BILITOT 0.3 07/23/2017 1510   BILITOT 0.43 10/24/2014 1426   GFRNONAA >60 04/15/2023 1354   GFRNONAA 82 06/14/2020 1029   GFRAA 95 06/14/2020 1029     Assessment/Plan:       Camie Furbish, PA-C [facility] 09/07/2023, 2:53 PM  Patient Care Team: Ozell Heron HERO, MD as PCP - General (Family Medicine) Avram Lupita BRAVO, MD as Consulting Physician (Gastroenterology) Dozier Agent, MD as Referring Physician (Orthopedic Surgery) Cary Doffing, MD as Consulting Physician (Dermatology) Jadine Charleston, MD as Consulting Physician (Dermatology) Vernetta Berg, MD as Consulting Physician (General Surgery)

## 2023-09-08 ENCOUNTER — Ambulatory Visit: Admitting: Gastroenterology

## 2023-09-08 ENCOUNTER — Encounter: Payer: Self-pay | Admitting: Gastroenterology

## 2023-09-08 VITALS — BP 120/60 | HR 72 | Ht 62.0 in | Wt 107.0 lb

## 2023-09-08 DIAGNOSIS — K644 Residual hemorrhoidal skin tags: Secondary | ICD-10-CM | POA: Diagnosis not present

## 2023-09-08 DIAGNOSIS — K5900 Constipation, unspecified: Secondary | ICD-10-CM | POA: Diagnosis not present

## 2023-09-08 NOTE — Patient Instructions (Signed)
 We have sent the following medications to your pharmacy for you to pick up at your convenience: Anusol  Suppositories   We have given you samples of Calmol Suppositories  Continue Miralax daily and fiber supplement   Follow up as needed  I appreciate the  opportunity to care for you  Thank You     Srar Heinz,PA-C

## 2023-09-09 ENCOUNTER — Telehealth: Payer: Self-pay | Admitting: Gastroenterology

## 2023-09-09 DIAGNOSIS — K644 Residual hemorrhoidal skin tags: Secondary | ICD-10-CM

## 2023-09-09 MED ORDER — HYDROCORTISONE ACETATE 25 MG RE SUPP: 25.0000 mg | Freq: Every day | RECTAL | 2 refills | Status: DC

## 2023-09-09 NOTE — Telephone Encounter (Signed)
 Patient daughter in law called and stated that her mother in law has not be able to pick up her suppositories form the [facility] pharmacy due to them saying that we have not called in a prescription for her. Patient daughter in law is requesting that we send over patient suppositories to the [facility] on [address]. Please advise.

## 2023-09-09 NOTE — Telephone Encounter (Signed)
 Per Sara's office note from 8/18, Patient is to take 1 suppository every night at bedtime for 7 days. Sent to [facility] on [address]

## 2023-09-15 ENCOUNTER — Ambulatory Visit: Payer: Self-pay

## 2023-09-15 ENCOUNTER — Ambulatory Visit: Admitting: Family Medicine

## 2023-09-15 VITALS — BP 124/74 | Temp 97.5°F | Wt 106.0 lb

## 2023-09-15 DIAGNOSIS — I6523 Occlusion and stenosis of bilateral carotid arteries: Secondary | ICD-10-CM

## 2023-09-15 DIAGNOSIS — M546 Pain in thoracic spine: Secondary | ICD-10-CM

## 2023-09-15 DIAGNOSIS — M542 Cervicalgia: Secondary | ICD-10-CM | POA: Diagnosis not present

## 2023-09-15 MED ORDER — METHYLPREDNISOLONE 4 MG PO TBPK: ORAL_TABLET | ORAL | 0 refills | Status: DC

## 2023-09-15 MED ORDER — METHOCARBAMOL 500 MG PO TABS: 500.0000 mg | ORAL_TABLET | Freq: Every day | ORAL | 0 refills | Status: DC

## 2023-09-15 NOTE — Telephone Encounter (Signed)
 FYI Only or Action Required?: FYI only for provider.  Patient was last seen in primary care on 07/07/2023 by Ozell Heron HERO, MD.  Called Nurse Triage reporting Triage.  Symptoms began several days ago.  Interventions attempted: OTC medications: tylenol  and aleve.  Symptoms are: gradually worsening.  Triage Disposition: No disposition on file.  Patient/caregiver understands and will follow disposition?:  Reason for Disposition  [1] MODERATE back pain (e.g., interferes with normal activities) AND [2] present > 3 days  Answer Assessment - Initial Assessment Questions 1. ONSET: When did the pain begin? (e.g., minutes, hours, days)     2-3 days  2. LOCATION: Where does it hurt? (upper, mid or lower back)     Right side shoulder/back pain  3. SEVERITY: How bad is the pain?  (e.g., Scale 1-10; mild, moderate, or severe)     8/10, 5/10 with Aleve  4. PATTERN: Is the pain constant? (e.g., yes, no; constant, intermittent)      Constant  5. RADIATION: Does the pain shoot into your legs or somewhere else?     Denies  6. CAUSE:  What do you think is causing the back pain?      Unknown, denies recent falls  7. BACK OVERUSE:  Any recent lifting of heavy objects, strenuous work or exercise?     Denies  8. MEDICINES: What have you taken so far for the pain? (e.g., nothing, acetaminophen , NSAIDS)     Aleve and tylenol   Protocols used: Back Pain-A-AH Copied from CRM #[id number]. Topic: Clinical - Red Word Triage >> Sep 15, 2023 10:45 AM Robinson H wrote: [facility] that prompted transfer to Nurse Triage: Back pain on right side, below shoulder blade

## 2023-09-16 ENCOUNTER — Ambulatory Visit (INDEPENDENT_AMBULATORY_CARE_PROVIDER_SITE_OTHER)
Admission: RE | Admit: 2023-09-16 | Discharge: 2023-09-16 | Disposition: A | Discharge status: Home / Self Care | Source: Ambulatory Visit | Attending: Family Medicine | Admitting: Family Medicine

## 2023-09-16 ENCOUNTER — Encounter: Payer: Self-pay | Admitting: Family Medicine

## 2023-09-16 DIAGNOSIS — M546 Pain in thoracic spine: Secondary | ICD-10-CM

## 2023-09-16 DIAGNOSIS — M8580 Other specified disorders of bone density and structure, unspecified site: Secondary | ICD-10-CM | POA: Diagnosis not present

## 2023-09-16 DIAGNOSIS — M40204 Unspecified kyphosis, thoracic region: Secondary | ICD-10-CM | POA: Diagnosis not present

## 2023-09-16 DIAGNOSIS — M50321 Other cervical disc degeneration at C4-C5 level: Secondary | ICD-10-CM | POA: Diagnosis not present

## 2023-09-16 DIAGNOSIS — M4802 Spinal stenosis, cervical region: Secondary | ICD-10-CM | POA: Diagnosis not present

## 2023-09-16 DIAGNOSIS — M542 Cervicalgia: Secondary | ICD-10-CM | POA: Diagnosis not present

## 2023-09-16 NOTE — Progress Notes (Signed)
   Subjective:    Patient ID: Victoria Long, female    DOB: 02-02-1928, 89 y.o.   MRN: [id number]  HPI Here for a sharp pain in the right upper back that started 4 days ago. No recent trauma. No radiation of pain to the arms. She had tried heat and Ibuprofen with mixed results.    Review of Systems  Constitutional: Negative.   Respiratory: Negative.    Cardiovascular: Negative.   Musculoskeletal:  Positive for back pain.       Objective:   Physical Exam Constitutional:      Appearance: Normal appearance.  Cardiovascular:     Rate and Rhythm: Normal rate and regular rhythm.     Pulses: Normal pulses.     Heart sounds: Normal heart sounds.  Pulmonary:     Breath sounds: Normal breath sounds.  Musculoskeletal:     Comments: She is tender to the right side of the lower neck and to the right side of the upper thoracic back area. She has full extension of the spine but flexion is limited by pain   Neurological:     Mental Status: She is alert.           Assessment & Plan:  Right upper thoracic pain. We will treat this with a Medrol  dose pack and Methocarbamol . Get Xrays of the cervical and thoracic spines.  Garnette Olmsted, MD

## 2023-09-17 NOTE — Telephone Encounter (Signed)
 Spoke with Faye, the patient's family member and informed her of the message below.  She stated the patient was seen by Dr Johnny on 8/25 and is awaiting x-ray results.

## 2023-09-17 NOTE — Telephone Encounter (Signed)
 Noted, pt will need an appt to be evaluated

## 2023-09-18 ENCOUNTER — Telehealth: Payer: Self-pay | Admitting: *Deleted

## 2023-09-18 NOTE — Telephone Encounter (Signed)
 Copied from CRM [id number]. Topic: Clinical - Lab/Test Results >> Sep 18, 2023 10:48 AM Macario HERO wrote: Reason for CRM: Patient daughter-in-law [name] called regarding lab results. Advised once final we will follow up.

## 2023-09-23 ENCOUNTER — Telehealth: Payer: Self-pay | Admitting: *Deleted

## 2023-09-23 NOTE — Telephone Encounter (Signed)
 Copied from CRM [id number]. Topic: Clinical - Lab/Test Results >> Sep 23, 2023  3:30 PM Franky GRADE wrote: Reason for CRM: Patient's son is calling regarding x-ray results for test she had done on 09/16/2023. I advised results are not in as of yet. He would like to know if there is anyway to expedite the results.

## 2023-09-26 ENCOUNTER — Encounter: Payer: Self-pay | Admitting: Family Medicine

## 2023-09-26 ENCOUNTER — Ambulatory Visit: Admitting: Family Medicine

## 2023-09-26 ENCOUNTER — Ambulatory Visit: Payer: Self-pay | Admitting: Family Medicine

## 2023-09-26 ENCOUNTER — Telehealth: Payer: Self-pay | Admitting: *Deleted

## 2023-09-26 VITALS — BP 110/60 | HR 85 | Temp 97.8°F | Ht 62.0 in | Wt 104.2 lb

## 2023-09-26 DIAGNOSIS — M546 Pain in thoracic spine: Secondary | ICD-10-CM | POA: Diagnosis not present

## 2023-09-26 DIAGNOSIS — H6123 Impacted cerumen, bilateral: Secondary | ICD-10-CM | POA: Diagnosis not present

## 2023-09-26 DIAGNOSIS — R350 Frequency of micturition: Secondary | ICD-10-CM

## 2023-09-26 LAB — POC URINALSYSI DIPSTICK (AUTOMATED)
Bilirubin, UA: NEGATIVE
Blood, UA: NEGATIVE
Glucose, UA: NEGATIVE
Ketones, UA: NEGATIVE
Leukocytes, UA: NEGATIVE
Nitrite, UA: NEGATIVE
Protein, UA: NEGATIVE
Spec Grav, UA: 1.025 (ref 1.010–1.025)
Urobilinogen, UA: 0.2 U/dL
pH, UA: 6 (ref 5.0–8.0)

## 2023-09-26 MED ORDER — DICLOFENAC SODIUM 1 % EX GEL: 4.0000 g | Freq: Four times a day (QID) | CUTANEOUS | 1 refills | Status: DC | PRN

## 2023-09-26 NOTE — Telephone Encounter (Signed)
 Left detailed message for pt to call the office back regarding x-ray results

## 2023-09-26 NOTE — Progress Notes (Signed)
 Established Patient Office Visit  Subjective   Patient ID: Victoria Long, female    DOB: 1928/05/29  Age: 89 y.o. MRN: [id number]  Chief Complaint  Patient presents with   Ear Fullness    X1 month   Urinary Frequency    Patient complains of having to urinate 3-4 times at night for the past 2 weeks    Ear Fullness   Urinary Frequency  Associated symptoms include frequency.   Discussed the use of AI scribe software for clinical note transcription with the patient, who gave verbal consent to proceed.  History of Present Illness   Victoria Long is a 89 year old female who presents with back pain.  She experiences persistent back pain primarily on the right side, which sometimes worsens but is relieved when she lies down. The pain began two weeks ago while she was getting ready for bed. She takes pain medication twice a day and supplements with Tylenol , which provides temporary relief. Occasionally, the pain affects her entire back.  She has a history of increased urination at night, which was exacerbated while on steroids but has since calmed down. No burning sensation during urination, fevers, or chills. Her family was concerned about a possible urinary tract infection, but her urine test showed no signs of infection.  She has a history of a comminuted spinous process fracture of L1 and L2 from 2018. She uses a cane, sometimes in the right hand, and has a walker at home. She has been engaging in physical therapy, primarily for her knees and balance issues, but this has been paused recently.  She reports worsening hearing issues. Her family arranged for her ears to be cleaned due to a buildup of wax, which has been present for many years.      Current Outpatient Medications  Medication Instructions   acetaminophen  (TYLENOL ) 500 mg, 3 times daily   calcium  carbonate (OS-CAL) 600 mg, 2 times daily with meals   Cyanocobalamin  (VITAMIN B 12 PO) 1 capsule, Daily    famotidine  (PEPCID ) 20 MG tablet TAKE 1 TABLET BY MOUTH EVERY NIGHT AT BEDTIME   hydrocortisone  (ANUSOL -HC) 2.5 % rectal cream 1 Application, Rectal, 2 times daily   hydrocortisone  (ANUSOL -HC) 25 mg, Rectal, Daily at bedtime, For 7 days   lidocaine  (XYLOCAINE ) 5 % ointment 1 Application, Topical, 4 times daily PRN   LYSINE HCL PO 500 mg, Daily   methocarbamol  (ROBAXIN ) 500 mg, Oral, Daily at bedtime   Multiple Vitamins-Minerals (MULTIVITAMIN PO) Daily   olmesartan  (BENICAR ) 20 mg, Oral, Daily   Vitamin D  1,000 Units, 2 times daily    Patient Active Problem List   Diagnosis Date Noted   External hemorrhoids 06/14/2020   Sigmoid diverticulosis 05/02/2020   Senile purpura (HCC) 01/11/2019   Unilateral primary osteoarthritis, right knee 08/13/2018   Atherosclerosis of aorta (HCC) 05/28/2018   Malignant neoplasm of upper-outer quadrant of left breast in female, estrogen receptor positive (HCC) 07/23/2017   Melanoma in situ of upper arm, right (HCC) 07/23/2017   At high risk for falls 07/23/2017   Invasive ductal carcinoma of breast, female, left (HCC) 07/15/2017   Essential hypertension 10/01/2016   Pancreatic mass 08/22/2016   History of right breast cancer 10/25/2013   Encounter for long-term (current) use of medications 04/19/2013   Hyperlipidemia    Abnormal glucose    Vitamin D  deficiency      Review of Systems  Genitourinary:  Positive for frequency.      Objective:  BP 110/60   Pulse 85   Temp 97.8 F (36.6 C) (Oral)   Ht 5' 2 (1.575 m)   Wt 104 lb 3.2 oz (47.3 kg)   SpO2 98%   BMI 19.06 kg/m    Physical Exam Vitals reviewed.  Constitutional:      Appearance: Normal appearance. She is normal weight.  HENT:     Right Ear: There is impacted cerumen.     Left Ear: There is impacted cerumen.  Musculoskeletal:        General: Tenderness (right side of back under her scapula, right paraspinal muscles) present. No swelling or deformity.     Comments: Pt has  kyphosis of the cervical and thoracic spine as well  Neurological:     Mental Status: She is alert.      Results for orders placed or performed in visit on 09/26/23  POCT Urinalysis Dipstick (Automated)  Result Value Ref Range   Color, UA yellow    Clarity, UA clear    Glucose, UA Negative Negative   Bilirubin, UA negative    Ketones, UA negative    Spec Grav, UA 1.025 1.010 - 1.025   Blood, UA negative    pH, UA 6.0 5.0 - 8.0   Protein, UA Negative Negative   Urobilinogen, UA 0.2 0.2 or 1.0 E.U./dL   Nitrite, UA negative    Leukocytes, UA Negative Negative   Impacted Cerumen Irrigation Procedure Note Patient had impacted cerumen based on my evaluation with otoscope.  Irrigation was recommended.  Verbal consent was obtained.  The affected auditory canal(s) were then irrigated with successful removal of impacted cerumen as confirmed on post procedural repeat otoscopy.  Right ear irrigation was successful however pt developed dizziness. Left EAC was manually debrided with a lighted curette.   Patient tolerated well without complications.       The ASCVD Risk score (Arnett DK, et al., 2019) failed to calculate for the following reasons:   The 2019 ASCVD risk score is only valid for ages 80 to 15    Assessment & Plan:  Bilateral impacted cerumen  Urinary frequency -     POCT Urinalysis Dipstick (Automated)  Acute right-sided thoracic back pain   Assessment and Plan    Chronic musculoskeletal pain of right upper thoracic back and lower neck Chronic pain likely due to muscle strain or arthritis. X-ray shows no major abnormalities. Differential includes musculoskeletal issues. - Continue current pain management with prescribed pain medication and acetaminophen  as needed. Pt cannot take tramadol  due to dizziness reaction --Add diclofenac  gel 1%, pt may apply up to 4 times daily as needed while waiting for the X-ray results.  - Await radiologist's report on x-ray. - Consider  referral to spine specialist if pain persists or worsens. - Encourage continuation of physical therapy focusing on musculoskeletal strengthening and balance. - Consider further imaging, such as MRI, if recommended by spine specialist.  Bilateral cerumen impaction Cerumen impaction causing decreased hearing due to significant debris. - Perform bilateral ear irrigation to remove cerumen. Procedure was successful, both TM's are visualized and EAC's clear.         No follow-ups on file.    Heron CHRISTELLA Sharper, MD

## 2023-09-26 NOTE — Telephone Encounter (Signed)
Left a message for pt to call the office back °

## 2023-09-26 NOTE — Addendum Note (Signed)
 Addended by: JOHNNY SENIOR A on: 09/26/2023 04:16 PM   Modules accepted: Orders

## 2023-09-26 NOTE — Telephone Encounter (Signed)
 Per Dr Ozell, I called [facility] at [phone], spoke with MJ and requested over-read for x-rays ordered by Dr Johnny on 8/26.

## 2023-09-29 NOTE — Telephone Encounter (Signed)
 Copied from CRM [id number]. Topic: Clinical - Lab/Test Results >> Sep 29, 2023 12:26 PM Rea C wrote: Reason for CRM: Daughter in law [name] called in with further questions for patients x-ray results. >> Sep 29, 2023 12:33 PM Rea BROCKS wrote: Daughter-in laws phone number [phone]. Please call daughter in law to review imaging results

## 2023-09-30 ENCOUNTER — Telehealth: Payer: Self-pay | Admitting: Family Medicine

## 2023-09-30 NOTE — Telephone Encounter (Signed)
 Reviewed x-ray results with pt voiced understanding

## 2023-09-30 NOTE — Telephone Encounter (Signed)
 Done

## 2023-09-30 NOTE — Telephone Encounter (Signed)
 Copied from CRM [id number]. Topic: Referral - Question >> Sep 30, 2023  3:39 PM Frederich PARAS wrote: Reason for CRM: pt came in in office and was given  referral for mri on 9/5 by stephen fry ,however  [facility] they dont do open mri ,pt is not comfortable with it. She says [facility] does do open mri, this there # [phone]. Pt wants to know if the referral can be re done to [facility] because they do open mri's pt callback # is [phone]. Pt daughter  in law sarah diachenko would like to know why the mri was needed .

## 2023-10-01 ENCOUNTER — Telehealth: Payer: Self-pay | Admitting: *Deleted

## 2023-10-01 NOTE — Telephone Encounter (Signed)
 Forwarded to the referral coordinator.

## 2023-10-01 NOTE — Telephone Encounter (Signed)
 Ok to send the order to [facility] at that number

## 2023-10-01 NOTE — Telephone Encounter (Signed)
 Copied from CRM [id number]. Topic: Clinical - Home Health Verbal Orders >> Oct 01, 2023  8:17 AM Thersia BROCKS wrote: Caller/Agency:[facility]  Callback Number: [phone] - Password Protected  Service Requested: Physical Therapy Frequency: 1 time a week for 8 week Any new concerns about the patient? Yes

## 2023-10-01 NOTE — Telephone Encounter (Signed)
 Spoke with Elodia and informed him of the approval as below.

## 2023-10-01 NOTE — Telephone Encounter (Signed)
 Ok to send orders.

## 2023-10-09 ENCOUNTER — Ambulatory Visit
Admission: RE | Admit: 2023-10-09 | Discharge: 2023-10-09 | Disposition: A | Discharge status: Home / Self Care | Source: Ambulatory Visit | Attending: Family Medicine | Admitting: Family Medicine

## 2023-10-09 DIAGNOSIS — M546 Pain in thoracic spine: Secondary | ICD-10-CM

## 2023-10-09 DIAGNOSIS — S22050A Wedge compression fracture of T5-T6 vertebra, initial encounter for closed fracture: Secondary | ICD-10-CM | POA: Diagnosis not present

## 2023-10-17 ENCOUNTER — Ambulatory Visit: Admitting: Family Medicine

## 2023-10-17 ENCOUNTER — Encounter: Payer: Self-pay | Admitting: Family Medicine

## 2023-10-17 VITALS — BP 120/60 | Temp 98.1°F | Wt 107.2 lb

## 2023-10-17 DIAGNOSIS — S22050A Wedge compression fracture of T5-T6 vertebra, initial encounter for closed fracture: Secondary | ICD-10-CM

## 2023-10-17 DIAGNOSIS — S22050D Wedge compression fracture of T5-T6 vertebra, subsequent encounter for fracture with routine healing: Secondary | ICD-10-CM

## 2023-10-17 MED ORDER — DICLOFENAC SODIUM 75 MG PO TBEC: 75.0000 mg | DELAYED_RELEASE_TABLET | Freq: Two times a day (BID) | ORAL | 1 refills | Status: DC

## 2023-10-17 NOTE — Progress Notes (Signed)
   Subjective:    Patient ID: Victoria Long, female    DOB: 12/11/1928, 89 y.o.   MRN: [id number]  HPI Here to follow up our visit on 09-15-23 when she presented with a 4 day hx of upper back pain. There was no recent trauma. We obtained cervical spine and thoracic spine Xrays which advanced degenerative disc disease but also a likely compression fracture at T6. We then got an MRI which confirmed the compression fracture at T6. No significant displacement or herniation. We had given her the option of a kyphoplasty procedure, and she wants to learn more about this today. She is taking Ibuprofen and Tylenol , and her pain has improved quite a bit since we saw her. She has no pain when lying in bed so sleeps well.    Review of Systems  Constitutional: Negative.   Respiratory: Negative.    Cardiovascular: Negative.   Musculoskeletal:  Positive for back pain.       Objective:   Physical Exam Constitutional:      Comments: Walking with a cane, she gets in and out of the chair without assistance   Cardiovascular:     Rate and Rhythm: Normal rate and regular rhythm.     Pulses: Normal pulses.     Heart sounds: Normal heart sounds.  Pulmonary:     Effort: Pulmonary effort is normal.     Breath sounds: Normal breath sounds.  Musculoskeletal:     Comments: She is mildly tender between the shoulder blades   Neurological:     Mental Status: She is alert.           Assessment & Plan:  Thoracic vertebral compression fracture. I described the kyphoplasty procedure, and she ended up deciding not to have this done. Her pain levels have already improved. We will treat her with Diclofenac  to take BID, and she can supplement this with Tylenol  as needed. Report back in 2 weeks.  Garnette Olmsted, MD

## 2023-10-23 ENCOUNTER — Encounter: Payer: Self-pay | Admitting: Family Medicine

## 2023-10-23 ENCOUNTER — Ambulatory Visit: Payer: Self-pay

## 2023-10-23 ENCOUNTER — Ambulatory Visit: Admitting: Family Medicine

## 2023-10-23 VITALS — BP 124/78 | HR 59 | Temp 98.6°F | Ht 62.0 in | Wt 107.0 lb

## 2023-10-23 DIAGNOSIS — R300 Dysuria: Secondary | ICD-10-CM

## 2023-10-23 DIAGNOSIS — R197 Diarrhea, unspecified: Secondary | ICD-10-CM

## 2023-10-23 DIAGNOSIS — K644 Residual hemorrhoidal skin tags: Secondary | ICD-10-CM

## 2023-10-23 DIAGNOSIS — R350 Frequency of micturition: Secondary | ICD-10-CM | POA: Diagnosis not present

## 2023-10-23 LAB — POCT URINALYSIS DIPSTICK
Bilirubin, UA: NEGATIVE
Blood, UA: NEGATIVE
Glucose, UA: NEGATIVE
Ketones, UA: NEGATIVE
Leukocytes, UA: NEGATIVE
Nitrite, UA: NEGATIVE
Protein, UA: NEGATIVE
Spec Grav, UA: 1.015 (ref 1.010–1.025)
Urobilinogen, UA: NEGATIVE U/dL — AB
pH, UA: 6.5 (ref 5.0–8.0)

## 2023-10-23 NOTE — Telephone Encounter (Signed)
 FYI Only or Action Required?: FYI only for provider.  Patient was last seen in primary care on 10/17/2023 by Victoria Garnette LABOR, MD.  Called Nurse Triage reporting Diarrhea.  Symptoms began several days ago.  Interventions attempted: Nothing.  Symptoms are: unchanged.  Triage Disposition: See Physician Within 24 Hours  Patient/caregiver understands and will follow disposition?: Yes        Patient daughter in law [name] called stated patient has had diarrhea couple of days, feeling fatigue. Would like a callback at [phone] on if she needs to be seen or if she can be prescribed something        Reason for Disposition  [1] MODERATE diarrhea (e.g., 4-6 times / day more than normal) AND [2] present > 48 hours (2 days)  Answer Assessment - Initial Assessment Questions 1. DIARRHEA SEVERITY: How bad is the diarrhea? How many more stools have you had in the past 24 hours than normal?      2 2. ONSET: When did the diarrhea begin?      2-3 days ago 3. STOOL DESCRIPTION:  How loose or watery is the diarrhea? What is the stool color? Is there any blood or mucous in the stool?     unknown 4. VOMITING: Are you also vomiting? If Yes, ask: How many times in the past 24 hours?      no 5. ABDOMEN PAIN: Are you having any abdomen pain? If Yes, ask: What does it feel like? (e.g., crampy, dull, intermittent, constant)      denies 6. ABDOMEN PAIN SEVERITY: If present, ask: How bad is the pain?  (e.g., Scale 1-10; mild, moderate, or severe)     denies 7. ORAL INTAKE: If vomiting, Have you been able to drink liquids? How much liquids have you had in the past 24 hours?     denies 8. HYDRATION: Any signs of dehydration? (e.g., dry mouth [not just dry lips], too weak to stand, dizziness, new weight loss) When did you last urinate?     Fatigue,  9. EXPOSURE: Have you traveled to a foreign country recently? Have you been exposed to anyone with diarrhea? Could you  have eaten any food that was spoiled?     no 10. ANTIBIOTIC USE: Are you taking antibiotics now or have you taken antibiotics in the past 2 months?       no 11. OTHER SYMPTOMS: Do you have any other symptoms? (e.g., fever, blood in stool)       Nausea, just started diclofenac  12. PREGNANCY: Is there any chance you are pregnant? When was your last menstrual period?       na  Protocols used: [facility]

## 2023-10-23 NOTE — Progress Notes (Unsigned)
   Acute Office Visit   Subjective:  Patient ID: Victoria Long, female    DOB: 25-Jul-1928, 89 y.o.   MRN: [id number]  Chief Complaint  Patient presents with  . Diarrhea    HPI Patient is complaining of diarrhea. She reports she has a history of a soft stool. However, since starting Diclofenac  BID on Saturday, her stools changed about a day after to loose, from being soft. She reports now she is having a BM every time she eats. She reports her rectum is sore from diarrhea and has hemorrhoids. Also, complains of abd pain, uncertain if it is mid or lower abd pain, she mentioned both. Pain started 1-2 days starting medication. Intermittent. Pain last for seconds. Usually occurs 1-2 a day. Described as sharp. No radiating pain. Denies blood in stool, but feels her stools are darker than usual. Denies nausea, vomiting, fever. Reports she has a burning sensation at the end of urinating.  ROS See HPI above      Objective:    BP 124/78   Pulse (!) 59   Temp 98.6 F (37 C) (Oral)   Ht 5' 2 (1.575 m)   Wt 107 lb (48.5 kg)   SpO2 98%   BMI 19.57 kg/m  {Vitals History (Optional):23777}  Physical Exam  No results found for any visits on 10/23/23.      Assessment & Plan:  There are no diagnoses linked to this encounter.  No follow-ups on file.  Jeanclaude Wentworth, NP

## 2023-10-24 LAB — URINE CULTURE
MICRO NUMBER:: 17048163
Result:: NO GROWTH
SPECIMEN QUALITY:: ADEQUATE

## 2023-10-24 NOTE — Telephone Encounter (Signed)
 Noted- ok to close.

## 2023-10-24 NOTE — Patient Instructions (Addendum)
-  It was a pleasure to care for you.  -Urinalysis completed with concerns of a burning sensation at the end of urinating. Negative for any signs of infection. Will send urine for culture and provide results on MyChart and will give a call once reviewed.  -Rectal exam completed. No rectal bleeding found.  -As for hemorrhoids, recommend to use sitz baths and apply Hydrocortisone  cream to help relief the pain.  -Recommend to stop taking Diclofenac  for a few days to see if this improves diarrhea. Suspect the new medication is causing diarrhea. You may continue to take Tylenol  as needed for pain.  - May want to change up diet to see if this helps. Also, continue to take Miralax and Metamucil to keep stools soft, may want to decrease daily amount a little to see if this helps with diarrhea. -Please send back a message through MyChart to see how you are doing over the weekend. By some chance if symptoms become worse over the weekend, increased pain, nausea, vomiting, excessive diarrhea, fever, please go to the closes emergency department.

## 2023-10-26 ENCOUNTER — Encounter: Payer: Self-pay | Admitting: Family Medicine

## 2023-10-27 ENCOUNTER — Ambulatory Visit (HOSPITAL_COMMUNITY)
Admission: RE | Admit: 2023-10-27 | Discharge: 2023-10-27 | Disposition: A | Discharge status: Home / Self Care | Source: Ambulatory Visit | Attending: Family Medicine | Admitting: Family Medicine

## 2023-10-27 DIAGNOSIS — I6523 Occlusion and stenosis of bilateral carotid arteries: Secondary | ICD-10-CM | POA: Insufficient documentation

## 2023-10-28 ENCOUNTER — Ambulatory Visit: Payer: Self-pay | Admitting: Family Medicine

## 2023-10-29 ENCOUNTER — Telehealth: Payer: Self-pay | Admitting: *Deleted

## 2023-10-29 NOTE — Telephone Encounter (Signed)
 Copied from CRM [id number]. Topic: Clinical - Lab/Test Results >> Oct 29, 2023 11:05 AM Macario HERO wrote: Reason for CRM: Patient daughter called regarding CT scan results. Requesting a call back. Call back: [phone].

## 2023-10-29 NOTE — Telephone Encounter (Signed)
 See results note-call was referring to an US  of the carotid.

## 2023-11-19 ENCOUNTER — Telehealth: Payer: Self-pay | Admitting: *Deleted

## 2023-11-19 NOTE — Telephone Encounter (Signed)
 Spoke with the patient's daughter in law and advised the patient should fast for the physical as PCP may order cholesterol test since last done in September 2024.

## 2023-11-19 NOTE — Telephone Encounter (Signed)
 Copied from CRM #[id number]. Topic: General - Other >> Nov 19, 2023 10:49 AM Aleatha BROCKS wrote: Reason for CRM: Patient wants to know if she needs to fast for upcoming apointment please call  [phone] Daughter [name]

## 2023-11-21 ENCOUNTER — Telehealth: Payer: Self-pay | Admitting: *Deleted

## 2023-11-21 NOTE — Telephone Encounter (Signed)
 See prior phone note.

## 2023-11-21 NOTE — Telephone Encounter (Signed)
 Copied from CRM [id number]. Topic: Clinical - Medical Advice >> Nov 13, 2023  3:25 PM Alfonso ORN wrote: Reason for CRM: Pt daughter in law called on behalf of pt to confirm if fasting is required for physical per notes fasting not required but she remembered writing down that it was. Please advise [phone] (M)

## 2023-11-25 ENCOUNTER — Ambulatory Visit (INDEPENDENT_AMBULATORY_CARE_PROVIDER_SITE_OTHER): Admitting: Family Medicine

## 2023-11-25 ENCOUNTER — Encounter: Admitting: Family Medicine

## 2023-11-25 ENCOUNTER — Encounter: Payer: Self-pay | Admitting: Family Medicine

## 2023-11-25 VITALS — BP 144/60 | HR 65 | Temp 97.4°F | Ht 61.5 in | Wt 107.4 lb

## 2023-11-25 DIAGNOSIS — K2100 Gastro-esophageal reflux disease with esophagitis, without bleeding: Secondary | ICD-10-CM | POA: Diagnosis not present

## 2023-11-25 DIAGNOSIS — Z0000 Encounter for general adult medical examination without abnormal findings: Secondary | ICD-10-CM | POA: Diagnosis not present

## 2023-11-25 DIAGNOSIS — Z23 Encounter for immunization: Secondary | ICD-10-CM

## 2023-11-25 DIAGNOSIS — Z111 Encounter for screening for respiratory tuberculosis: Secondary | ICD-10-CM | POA: Diagnosis not present

## 2023-11-25 DIAGNOSIS — I10 Essential (primary) hypertension: Secondary | ICD-10-CM

## 2023-11-25 DIAGNOSIS — E785 Hyperlipidemia, unspecified: Secondary | ICD-10-CM

## 2023-11-25 LAB — COMPREHENSIVE METABOLIC PANEL WITH GFR
ALT: 14 U/L (ref 0–35)
AST: 18 U/L (ref 0–37)
Albumin: 3.9 g/dL (ref 3.5–5.2)
Alkaline Phosphatase: 49 U/L (ref 39–117)
BUN: 22 mg/dL (ref 6–23)
CO2: 29 meq/L (ref 19–32)
Calcium: 9.4 mg/dL (ref 8.4–10.5)
Chloride: 106 meq/L (ref 96–112)
Creatinine, Ser: 0.71 mg/dL (ref 0.40–1.20)
GFR: 73.13 mL/min (ref >60.00)
Glucose, Bld: 88 mg/dL (ref 70–99)
Potassium: 4.5 meq/L (ref 3.5–5.1)
Sodium: 141 meq/L (ref 135–145)
Total Bilirubin: 0.6 mg/dL (ref 0.2–1.2)
Total Protein: 6.5 g/dL (ref 6.0–8.3)

## 2023-11-25 LAB — LIPID PANEL
Cholesterol: 203 mg/dL — ABNORMAL HIGH (ref 0–200)
HDL: 69.2 mg/dL (ref >39.00)
LDL Cholesterol: 118 mg/dL — ABNORMAL HIGH (ref 0–99)
NonHDL: 133.36
Total CHOL/HDL Ratio: 3
Triglycerides: 76 mg/dL (ref 0.0–149.0)
VLDL: 15.2 mg/dL (ref 0.0–40.0)

## 2023-11-25 MED ORDER — FAMOTIDINE 20 MG PO TABS: ORAL_TABLET | ORAL | 1 refills | Status: AC

## 2023-11-25 MED ORDER — OLMESARTAN MEDOXOMIL 20 MG PO TABS: 20.0000 mg | ORAL_TABLET | Freq: Every day | ORAL | 1 refills | Status: AC

## 2023-11-25 NOTE — Progress Notes (Signed)
 Complete physical exam  Patient: Victoria Long   DOB: 07/19/1928   89 y.o. Female  MRN: [id number]  Subjective:    Chief Complaint  Patient presents with   Medicare Wellness    Victoria Long is a 89 y.o. female who presents today for a complete physical exam. She reports consuming a general diet. Exercise is limited by orthopedic condition(s): chronic back pain and knee OA. She generally feels well. She reports sleeping fairly well. She does not have additional problems to discuss today.    Most recent fall risk assessment:    11/25/2023    9:17 AM  Fall Risk   Falls in the past year? 1  Number falls in past yr: 0  Injury with Fall? 1  Risk for fall due to : Impaired balance/gait  Follow up Falls evaluation completed     Most recent depression screenings:    11/25/2023    9:20 AM 05/23/2023    1:00 PM  PHQ 2/9 Scores  PHQ - 2 Score 0 2  PHQ- 9 Score  3    Vision:Within last year and Dental: No current dental problems and Receives regular dental care  Patient Active Problem List   Diagnosis Date Noted   External hemorrhoids 06/14/2020   Sigmoid diverticulosis 05/02/2020   Senile purpura 01/11/2019   Unilateral primary osteoarthritis, right knee 08/13/2018   Atherosclerosis of aorta 05/28/2018   Malignant neoplasm of upper-outer quadrant of left breast in female, estrogen receptor positive (HCC) 07/23/2017   Melanoma in situ of upper arm, right (HCC) 07/23/2017   At high risk for falls 07/23/2017   Invasive ductal carcinoma of breast, female, left (HCC) 07/15/2017   Essential hypertension 10/01/2016   Pancreatic mass 08/22/2016   History of right breast cancer 10/25/2013   Encounter for long-term (current) use of medications 04/19/2013   Hyperlipidemia    Abnormal glucose    Vitamin D  deficiency       Patient Care Team: Ozell Heron HERO, MD as PCP - General (Family Medicine) Avram Lupita BRAVO, MD as Consulting Physician  (Gastroenterology) Dozier Agent, MD as Referring Physician (Orthopedic Surgery) Cary Doffing, MD as Consulting Physician (Dermatology) Jadine Charleston, MD as Consulting Physician (Dermatology) Vernetta Berg, MD as Consulting Physician (General Surgery)   Outpatient Medications Prior to Visit  Medication Sig Note   acetaminophen  (TYLENOL ) 325 MG tablet Take 500 mg by mouth in the morning, at noon, and at bedtime.    calcium  carbonate (OS-CAL) 600 MG TABS Take 600 mg by mouth 2 (two) times daily with a meal.     Cholecalciferol (VITAMIN D ) 1000 UNITS capsule Take 1,000 Units by mouth 2 (two) times daily.     Cyanocobalamin  (VITAMIN B 12 PO) Take 1 capsule by mouth daily.    diclofenac  Sodium (VOLTAREN ) 1 % GEL Apply 4 g topically 4 (four) times daily as needed.    hydrocortisone  (ANUSOL -HC) 2.5 % rectal cream Place 1 Application rectally 2 (two) times daily.    lidocaine  (XYLOCAINE ) 5 % ointment Apply 1 Application topically 4 (four) times daily as needed.    LYSINE HCL PO Take 500 mg by mouth daily.     Multiple Vitamins-Minerals (MULTIVITAMIN PO) Take by mouth daily.    [DISCONTINUED] diclofenac  (VOLTAREN ) 75 MG EC tablet Take 1 tablet (75 mg total) by mouth 2 (two) times daily. 11/25/2023: diarrhea   [DISCONTINUED] famotidine  (PEPCID ) 20 MG tablet TAKE 1 TABLET BY MOUTH EVERY NIGHT AT BEDTIME    [DISCONTINUED] olmesartan  (BENICAR )  20 MG tablet Take 1 tablet (20 mg total) by mouth daily.    [DISCONTINUED] hydrocortisone  (ANUSOL -HC) 25 MG suppository Place 1 suppository (25 mg total) rectally at bedtime. For 7 days    [DISCONTINUED] methocarbamol  (ROBAXIN ) 500 MG tablet Take 1 tablet (500 mg total) by mouth at bedtime.    No facility-administered medications prior to visit.    Review of Systems  HENT:  Negative for hearing loss.   Eyes:  Negative for blurred vision.  Respiratory:  Negative for shortness of breath.   Cardiovascular:  Negative for chest pain.  Gastrointestinal:  Negative.   Genitourinary: Negative.   Musculoskeletal:  Negative for back pain.  Neurological:  Negative for headaches.  Psychiatric/Behavioral:  Negative for depression.        Objective:     BP (!) 144/60   Pulse 65   Temp (!) 97.4 F (36.3 C) (Oral)   Ht 5' 1.5 (1.562 m)   Wt 107 lb 6.4 oz (48.7 kg)   SpO2 98%   BMI 19.96 kg/m    Physical Exam Vitals reviewed.  Constitutional:      Appearance: Normal appearance. She is well-groomed and normal weight.  HENT:     Right Ear: Tympanic membrane and ear canal normal.     Left Ear: Tympanic membrane and ear canal normal.     Mouth/Throat:     Mouth: Mucous membranes are moist.     Pharynx: No posterior oropharyngeal erythema.  Eyes:     Conjunctiva/sclera: Conjunctivae normal.  Neck:     Thyroid : No thyromegaly.  Cardiovascular:     Rate and Rhythm: Normal rate and regular rhythm.     Pulses: Normal pulses.     Heart sounds: S1 normal and S2 normal. Murmur heard.  Pulmonary:     Effort: Pulmonary effort is normal.     Breath sounds: Normal breath sounds and air entry.  Abdominal:     General: Abdomen is flat. Bowel sounds are normal.     Palpations: Abdomen is soft.  Musculoskeletal:     Right lower leg: Edema present.     Left lower leg: Edema present.  Lymphadenopathy:     Cervical: No cervical adenopathy.  Neurological:     Mental Status: She is alert and oriented to person, place, and time. Mental status is at baseline.     Gait: Gait is intact.  Psychiatric:        Mood and Affect: Mood and affect normal.        Speech: Speech normal.        Behavior: Behavior normal.        Judgment: Judgment normal.      No results found for any visits on 11/25/23.     Assessment & Plan:    Routine Health Maintenance and Physical Exam  Immunization History  Administered Date(s) Administered    sv, Bivalent, Protein Subunit Rsvpref,pf (Abrysvo) 03/01/2022   Hep A / Hep B 05/15/2022, 06/16/2023   INFLUENZA,  HIGH DOSE SEASONAL PF 11/08/2013, 10/04/2014, 11/13/2015, 10/31/2016, 11/12/2017, 10/06/2018, 10/18/2021, 10/27/2021, 10/17/2022   Influenza-Unspecified 11/10/2012, 10/04/2014, 10/13/2020, 11/21/2020, 10/27/2021   PFIZER(Purple Top)SARS-COV-2 Vaccination 02/05/2019, 02/26/2019, 10/26/2019, 12/23/2019   PNEUMOCOCCAL CONJUGATE-20 03/01/2022   Pneumococcal Conjugate-13 07/27/2013   Pneumococcal-Unspecified 11/30/2010   Respiratory Syncytial Virus Vaccine,Recomb Aduvanted(Arexvy) 01/27/2022   Td 02/12/2009   Tdap 05/15/2022   Zoster Recombinant(Shingrix) 09/17/2018, 11/17/2018   Zoster, Live 02/12/2006    Health Maintenance  Topic Date Due   Mammogram  08/16/2021  Influenza Vaccine  08/22/2023   COVID-19 Vaccine (5 - 2025-26 season) 09/22/2023   Medicare Annual Wellness (AWV)  01/27/2024   DTaP/Tdap/Td (3 - Td or Tdap) 05/14/2032   Pneumococcal Vaccine: 50+ Years  Completed   DEXA SCAN  Completed   Zoster Vaccines- Shingrix  Completed   Meningococcal B Vaccine  Aged Out   Hepatitis B Vaccines 19-59 Average Risk  Discontinued    Discussed health benefits of physical activity, and encouraged her to engage in regular exercise appropriate for her age and condition.  Routine adult health maintenance  Essential hypertension -     Olmesartan  Medoxomil; Take 1 tablet (20 mg total) by mouth daily.  Dispense: 90 tablet; Refill: 1 -     Comprehensive metabolic panel with GFR; Future  Gastroesophageal reflux disease with esophagitis without hemorrhage -     Famotidine ; TAKE 1 TABLET BY MOUTH EVERY NIGHT AT BEDTIME  Dispense: 90 tablet; Refill: 1  Hyperlipidemia, unspecified hyperlipidemia type -     Lipid panel; Future  Immunization due -     Flu vaccine HIGH DOSE PF(Fluzone Trivalent)  Screening-pulmonary TB -     TB Skin Test  General physical exam findings are normal today. I reviewed the patient's preventative testing, immunizations, and lifestyle habits. I made appropriate  recommendations and placed orders for the appropriate tests and/or vaccinations. I counseled the patient on the CDC's recommendations for healthy exercise and diet. I counseled the patient on healthy sleep habits and stress management. Handouts to reinforce the counseling were given at the conclusion of the visit.    Patient will be going to an assisted living facility and I filled out the paperwork for her today. PPD test was needed and placed today.   No follow-ups on file.     Heron CHRISTELLA Sharper, MD

## 2023-11-26 ENCOUNTER — Ambulatory Visit: Payer: Self-pay | Admitting: Family Medicine

## 2023-11-27 ENCOUNTER — Ambulatory Visit

## 2023-11-27 LAB — TB SKIN TEST
Induration: 0 mm
TB Skin Test: NEGATIVE

## 2023-11-27 NOTE — Progress Notes (Signed)
 PPD Reading Note  PPD read and results entered in Epic[employer].  Result: 0 mm induration.  Interpretation: Negative  If test not read within 48-72 hours of initial placement, patient advised to repeat in other arm 1-3 weeks after this test.  Allergic reaction: no

## 2023-12-04 ENCOUNTER — Emergency Department (HOSPITAL_BASED_OUTPATIENT_CLINIC_OR_DEPARTMENT_OTHER)

## 2023-12-04 ENCOUNTER — Other Ambulatory Visit: Payer: Self-pay

## 2023-12-04 ENCOUNTER — Emergency Department (HOSPITAL_BASED_OUTPATIENT_CLINIC_OR_DEPARTMENT_OTHER): Admission: EM | Admit: 2023-12-04 | Discharge: 2023-12-04 | Disposition: A | Discharge status: Home / Self Care

## 2023-12-04 DIAGNOSIS — M4854XA Collapsed vertebra, not elsewhere classified, thoracic region, initial encounter for fracture: Secondary | ICD-10-CM | POA: Diagnosis not present

## 2023-12-04 DIAGNOSIS — D649 Anemia, unspecified: Secondary | ICD-10-CM | POA: Diagnosis not present

## 2023-12-04 DIAGNOSIS — M5459 Other low back pain: Secondary | ICD-10-CM | POA: Diagnosis not present

## 2023-12-04 DIAGNOSIS — M4856XA Collapsed vertebra, not elsewhere classified, lumbar region, initial encounter for fracture: Secondary | ICD-10-CM | POA: Diagnosis not present

## 2023-12-04 DIAGNOSIS — M5126 Other intervertebral disc displacement, lumbar region: Secondary | ICD-10-CM | POA: Diagnosis not present

## 2023-12-04 DIAGNOSIS — R29890 Loss of height: Secondary | ICD-10-CM | POA: Diagnosis not present

## 2023-12-04 DIAGNOSIS — M5134 Other intervertebral disc degeneration, thoracic region: Secondary | ICD-10-CM | POA: Diagnosis not present

## 2023-12-04 DIAGNOSIS — M545 Low back pain, unspecified: Secondary | ICD-10-CM | POA: Insufficient documentation

## 2023-12-04 DIAGNOSIS — M419 Scoliosis, unspecified: Secondary | ICD-10-CM | POA: Diagnosis not present

## 2023-12-04 DIAGNOSIS — M48061 Spinal stenosis, lumbar region without neurogenic claudication: Secondary | ICD-10-CM | POA: Diagnosis not present

## 2023-12-04 LAB — URINALYSIS, ROUTINE W REFLEX MICROSCOPIC
Bilirubin Urine: NEGATIVE
Glucose, UA: NEGATIVE mg/dL
Hgb urine dipstick: NEGATIVE
Leukocytes,Ua: NEGATIVE
Nitrite: NEGATIVE
Specific Gravity, Urine: 1.023 (ref 1.005–1.030)
pH: 6.5 (ref 5.0–8.0)

## 2023-12-04 LAB — CBC
HCT: 33.7 % — ABNORMAL LOW (ref 36.0–46.0)
Hemoglobin: 11 g/dL — ABNORMAL LOW (ref 12.0–15.0)
MCH: 31.5 pg (ref 26.0–34.0)
MCHC: 32.6 g/dL (ref 30.0–36.0)
MCV: 96.6 fL (ref 80.0–100.0)
Platelets: 249 K/uL (ref 150–400)
RBC: 3.49 MIL/uL — ABNORMAL LOW (ref 3.87–5.11)
RDW: 13.9 % (ref 11.5–15.5)
WBC: 7.3 K/uL (ref 4.0–10.5)
nRBC: 0 % (ref 0.0–0.2)

## 2023-12-04 LAB — COMPREHENSIVE METABOLIC PANEL WITH GFR
ALT: 13 U/L (ref 0–44)
AST: 17 U/L (ref 15–41)
Albumin: 3.7 g/dL (ref 3.5–5.0)
Alkaline Phosphatase: 66 U/L (ref 38–126)
Anion gap: 10 (ref 5–15)
BUN: 23 mg/dL (ref 8–23)
CO2: 27 mmol/L (ref 22–32)
Calcium: 9.6 mg/dL (ref 8.9–10.3)
Chloride: 106 mmol/L (ref 98–111)
Creatinine, Ser: 0.72 mg/dL (ref 0.44–1.00)
GFR, Estimated: >60 mL/min (ref >60)
Glucose, Bld: 107 mg/dL — ABNORMAL HIGH (ref 70–99)
Potassium: 4 mmol/L (ref 3.5–5.1)
Sodium: 142 mmol/L (ref 135–145)
Total Bilirubin: 0.3 mg/dL (ref 0.0–1.2)
Total Protein: 6.2 g/dL — ABNORMAL LOW (ref 6.5–8.1)

## 2023-12-04 MED ORDER — LIDOCAINE 5 % EX PTCH
1.0000 | MEDICATED_PATCH | Freq: Once | CUTANEOUS | Status: DC
  Administered 2023-12-04: 1 via TRANSDERMAL
  Filled 2023-12-04: qty 1

## 2023-12-04 MED ORDER — ACETAMINOPHEN 325 MG PO TABS
650.0000 mg | ORAL_TABLET | Freq: Once | ORAL | Status: AC
  Administered 2023-12-04: 650 mg via ORAL
  Filled 2023-12-04: qty 2

## 2023-12-04 MED ORDER — LIDOCAINE 5 % EX PTCH: 1.0000 | MEDICATED_PATCH | CUTANEOUS | 0 refills | Status: DC

## 2023-12-04 NOTE — ED Notes (Signed)
 Patient transported to CT

## 2023-12-04 NOTE — ED Triage Notes (Signed)
 Patient states lower back pain for several weeks that has worsened over the past 3 days. Unable to get out of bed by herself for 3 days per son. Denies injury. Had MRI for upper back pain last month.

## 2023-12-04 NOTE — ED Provider Notes (Signed)
 [place] EMERGENCY DEPARTMENT AT [facility] Provider Note   CSN: [id number] Arrival date & time: 12/04/23  1419     Patient presents with: Back Pain   Victoria Long is a 89 y.o. female.   A 89 year old female presenting emergency department for back pain.  Reports worsening over the past 3 days.  Does have some minor back discomfort that she has had for several months.  Family notes that she had a T6 so the pain.  She notes that her pain is largely near her sacrum and across the low back.  No trauma or falls.  She notes symptoms started the day after she was packing to go to an independent living facility.  No fevers no chills.  Has been taking ibuprofen with improvement of her pain.  Family notes that she is having a more difficult time getting out of the bed due to her pain.  No weakness in her lower extremities, no bowel or bladder incontinence.  No saddle anesthesia.   Back Pain      Prior to Admission medications   Medication Sig Start Date End Date Taking? Authorizing Provider  acetaminophen  (TYLENOL ) 325 MG tablet Take 500 mg by mouth in the morning, at noon, and at bedtime. 11/04/17   Magrinat, Sandria BROCKS, MD  calcium  carbonate (OS-CAL) 600 MG TABS Take 600 mg by mouth 2 (two) times daily with a meal.     [provider]  Cholecalciferol (VITAMIN D ) 1000 UNITS capsule Take 1,000 Units by mouth 2 (two) times daily.     [provider]  Cyanocobalamin  (VITAMIN B 12 PO) Take 1 capsule by mouth daily.    [provider]  diclofenac  Sodium (VOLTAREN ) 1 % GEL Apply 4 g topically 4 (four) times daily as needed. 09/26/23   Ozell Heron HERO, MD  famotidine  (PEPCID ) 20 MG tablet TAKE 1 TABLET BY MOUTH EVERY NIGHT AT BEDTIME 11/25/23   Ozell Heron HERO, MD  hydrocortisone  (ANUSOL -HC) 2.5 % rectal cream Place 1 Application rectally 2 (two) times daily. 06/18/23   Ozell Heron HERO, MD  lidocaine  (XYLOCAINE ) 5 % ointment Apply 1 Application  topically 4 (four) times daily as needed. 07/07/23   Ozell Heron HERO, MD  LYSINE HCL PO Take 500 mg by mouth daily.     [provider]  Multiple Vitamins-Minerals (MULTIVITAMIN PO) Take by mouth daily.    [provider]  olmesartan  (BENICAR ) 20 MG tablet Take 1 tablet (20 mg total) by mouth daily. 11/25/23   Ozell Heron HERO, MD    Allergies: Anastrozole  and Tramadol     Review of Systems  Musculoskeletal:  Positive for back pain.    Updated Vital Signs BP (!) 164/71   Pulse 71   Temp 97.9 F (36.6 C)   Resp 16   SpO2 100%   Physical Exam Vitals and nursing note reviewed.  Constitutional:      General: She is not in acute distress.    Appearance: She is not toxic-appearing.  HENT:     Head: Normocephalic and atraumatic.     Nose: Nose normal.     Mouth/Throat:     Mouth: Mucous membranes are moist.  Eyes:     Conjunctiva/sclera: Conjunctivae normal.  Cardiovascular:     Rate and Rhythm: Normal rate and regular rhythm.  Pulmonary:     Effort: Pulmonary effort is normal.     Breath sounds: Normal breath sounds.  Abdominal:     General: Abdomen is flat. There  is no distension.     Palpations: Abdomen is soft.     Tenderness: There is no abdominal tenderness. There is no guarding or rebound.  Musculoskeletal:     Comments: She has no midline spinal tenderness.  Somewhat tender to the right sacrum/iliac crest and along the quadratus lumborum.  5 out of 5 plantarflexion dorsiflexion, able to lift both legs off bed.  Normal sensation.  Equal pulses.  Skin:    General: Skin is warm.     Capillary Refill: Capillary refill takes less than 2 seconds.  Neurological:     Mental Status: She is alert and oriented to person, place, and time.  Psychiatric:        Mood and Affect: Mood normal.        Behavior: Behavior normal.     (all labs ordered are listed, but only abnormal results are displayed) Labs Reviewed  CBC - Abnormal; Notable for the following  components:      Result Value   RBC 3.49 (*)    Hemoglobin 11.0 (*)    HCT 33.7 (*)    All other components within normal limits  COMPREHENSIVE METABOLIC PANEL WITH GFR - Abnormal; Notable for the following components:   Glucose, Bld 107 (*)    Total Protein 6.2 (*)    All other components within normal limits  URINALYSIS, ROUTINE W REFLEX MICROSCOPIC - Abnormal; Notable for the following components:   Ketones, ur TRACE (*)    Protein, ur TRACE (*)    All other components within normal limits    EKG: None  Radiology: CT Lumbar Spine Wo Contrast Result Date: 12/04/2023 EXAM: CT OF THE LUMBAR SPINE WITHOUT CONTRAST 12/04/2023 04:49:00 PM TECHNIQUE: CT of the lumbar spine was performed without the administration of intravenous contrast. Multiplanar reformatted images are provided for review. Automated exposure control, iterative reconstruction, and/or weight based adjustment of the mA/kV was utilized to reduce the radiation dose to as low as reasonably achievable. COMPARISON: MRI thoracic spine 10/09/2023 CLINICAL HISTORY: Low back pain, increased fracture risk. FINDINGS: BONES AND ALIGNMENT: 5 lumbar type vertebrae. Slight upper lumbar levoscoliosis. Trace retrolisthesis of L2 on L3. 4 mm anterolisthesis of L4 on L5. L1 inferior endplate compression fracture with 40% vertebral body height loss, new from the prior thoracic MRI. Diffuse osteopenia. No destructive bone lesion. DEGENERATIVE CHANGES: Moderate disc space narrowing at L4-L5 and L5-S1. No more than mild disc space narrowing elsewhere. Mild spinal stenosis at L3-L4 and moderate to severe stenosis at L4-L5 due to disc bulging and prominent posterior element hypertrophy. Mild to moderate left greater than right neural foraminal stenosis at L4-L5. SOFT TISSUES: Mild paravertebral soft tissue edema at L1. Aortic atherosclerosis. Bilateral renal sinus cysts for which no follow-up imaging is required. IMPRESSION: 1. L1 compression fracture with  40% vertebral body height loss, new from 10/09/2023. 2. Moderate to severe spinal stenosis at L4-L5 and mild spinal stenosis at L3-L4. Electronically signed by: Dasie Hamburg MD 12/04/2023 05:10 PM EST RP Workstation: [id number]   CT Thoracic Spine Wo Contrast Result Date: 12/04/2023 EXAM: CT THORACIC SPINE WITHOUT CONTRAST 12/04/2023 04:49:00 PM TECHNIQUE: CT of the thoracic spine was performed without the administration of intravenous contrast. Multiplanar reformatted images are provided for review. Automated exposure control, iterative reconstruction, and/or weight based adjustment of the mA/kV was utilized to reduce the radiation dose to as low as reasonably achievable. COMPARISON: MRI thoracic spine 10/09/2023. CLINICAL HISTORY: Mid-back pain, prior compression fracture. Low back pain for several weeks, worse for  the past 3 days. FINDINGS: BONES AND ALIGNMENT: T6 compression fracture with 75% vertebral body height loss, similar to the prior MRI. Heterogeneous sclerosis throughout the T6 vertebral body. No retropulsion. Background of diffuse osteopenia. No acute thoracic spine fracture. Small sclerotic foci in the T8, T9, and T11 vertebral bodies and right T9 transverse process, possibly bone islands. No destructive bone lesion. Mild mid to upper thoracic dextroscoliosis and mild lower thoracic levoscoliosis. Increased upper thoracic kyphosis. DEGENERATIVE CHANGES: Moderately advanced disc degeneration at C5-C6 and C6-C7. No more than mild multilevel disc degeneration in the thoracic spine. No evidence of high grade thoracic spinal canal or neural foraminal stenosis. SOFT TISSUES: Aortic and coronary atherosclerosis. Trace bilateral pleural effusions. Scarring/fibrosis in the anterior left lung. IMPRESSION: 1. T6 compression fracture with 75% height loss, nonprogressed from 10/09/2023. 2. No acute thoracic spine fracture. 3. Mild thoracic disc degeneration without evidence of high-grade stenosis. 4. Trace  bilateral pleural effusions. Electronically signed by: Dasie Hamburg MD 12/04/2023 05:05 PM EST RP Workstation: [id number]     Procedures   Medications Ordered in the ED  lidocaine  (LIDODERM ) 5 % 1 patch (1 patch Transdermal Patch Applied 12/04/23 1701)  acetaminophen  (TYLENOL ) tablet 650 mg (650 mg Oral Given 12/04/23 1700)    Clinical Course as of 12/04/23 1823  Thu Dec 04, 2023  1753 CT Lumbar Spine Wo Contrast IMPRESSION: 1. L1 compression fracture with 40% vertebral body height loss, new from 10/09/2023. 2. Moderate to severe spinal stenosis at L4-L5 and mild spinal stenosis at L3-L4.  Electronically signed by: Dasie Hamburg MD 12/04/2023 05:10 PM EST RP Workstation: [id number]   [TY]  1753 CT Thoracic Spine Wo Contrast IMPRESSION: 1. T6 compression fracture with 75% height loss, nonprogressed from 10/09/2023. 2. No acute thoracic spine fracture. 3. Mild thoracic disc degeneration without evidence of high-grade stenosis. 4. Trace bilateral pleural effusions.  Electronically signed by: Dasie Hamburg MD 12/04/2023 05:05 PM EST RP Workstation: [id number]   [TY]    Clinical Course User Index [TY] Neysa Caron PARAS, DO                                 Medical Decision Making This is a 89 year old female presenting emergency department for back pain.  She is afebrile nontachycardic is mildly hypertensive.  Physical exam reassuring.  I do not appreciate midline spinal tenderness.  Is some tenderness to the right low back near the SI joint and along the iliac crest/quadratus lumborum.  Per chart review had T6 compression fracture.  She notes she is not having any pain in her upper back or mid back.  Given her advanced age we will get basic labs and CT scans.  Update, no leukocytosis to suggest infectious process.  Stable anemia.  No metabolic derangement.  Normal kidney function.  No transaminitis to suggest hepatic disease.  UA without evidence of urinary tract infection.  CT scan  with L1 compression fracture 40% loss which is new compared to prior imaging.  Unchanged appearance of her T6 fracture.  She is not having any focal tenderness on my exam to the thoracolumbar junction.  CT scans did show spinal stenosis as well in the area of pain.  She is not displaying any evidence of cauda equina or acute cord compression.  She was treated with lidocaine  patch and Tylenol .  Offered stronger pain medications but family declined.  Offered TOC/placement options, but family declined patient's sons, cousin and family feel  comfortable taking care of her and will follow-up with her primary doctor regarding placement issues.  Was given return precautions.  Will discharge the patient and family's request.  Amount and/or Complexity of Data Reviewed Labs: ordered. Radiology: ordered. Decision-making details documented in ED Course.  Risk OTC drugs. Prescription drug management.       Final diagnoses:  None    ED Discharge Orders     None          Neysa Caron PARAS, DO 12/04/23 1823

## 2023-12-04 NOTE — Discharge Instructions (Signed)
 He may use lidocaine  patches for further pain control.  Otherwise, continuing Tylenol  ibuprofen.  Follow-up with your primary doctor.  You may also follow-up with neurosurgeon as needed.  Return for fevers, chills, lightheadedness, chest pain, shortness of breath, severe pain, weakness in lower extremities, bowel or bladder incontinence, or any new or worsening symptoms that are concerning to you.

## 2023-12-08 ENCOUNTER — Observation Stay (HOSPITAL_COMMUNITY)
Admission: EM | Admit: 2023-12-08 | Discharge: 2023-12-12 | Disposition: A | Discharge status: Skilled Nursing Facility | Attending: Emergency Medicine | Admitting: Emergency Medicine

## 2023-12-08 ENCOUNTER — Other Ambulatory Visit: Payer: Self-pay

## 2023-12-08 ENCOUNTER — Emergency Department (HOSPITAL_COMMUNITY)

## 2023-12-08 ENCOUNTER — Encounter (HOSPITAL_COMMUNITY): Payer: Self-pay

## 2023-12-08 DIAGNOSIS — M6281 Muscle weakness (generalized): Secondary | ICD-10-CM | POA: Insufficient documentation

## 2023-12-08 DIAGNOSIS — E43 Unspecified severe protein-calorie malnutrition: Secondary | ICD-10-CM | POA: Insufficient documentation

## 2023-12-08 DIAGNOSIS — R7303 Prediabetes: Secondary | ICD-10-CM | POA: Diagnosis not present

## 2023-12-08 DIAGNOSIS — R29890 Loss of height: Secondary | ICD-10-CM | POA: Diagnosis not present

## 2023-12-08 DIAGNOSIS — Z66 Do not resuscitate: Secondary | ICD-10-CM | POA: Diagnosis not present

## 2023-12-08 DIAGNOSIS — M545 Low back pain, unspecified: Secondary | ICD-10-CM | POA: Insufficient documentation

## 2023-12-08 DIAGNOSIS — M170 Bilateral primary osteoarthritis of knee: Secondary | ICD-10-CM | POA: Diagnosis not present

## 2023-12-08 DIAGNOSIS — M546 Pain in thoracic spine: Secondary | ICD-10-CM

## 2023-12-08 DIAGNOSIS — M48061 Spinal stenosis, lumbar region without neurogenic claudication: Secondary | ICD-10-CM | POA: Diagnosis not present

## 2023-12-08 DIAGNOSIS — Z681 Body mass index (BMI) 19 or less, adult: Secondary | ICD-10-CM | POA: Diagnosis not present

## 2023-12-08 DIAGNOSIS — I700 Atherosclerosis of aorta: Secondary | ICD-10-CM | POA: Diagnosis not present

## 2023-12-08 DIAGNOSIS — J90 Pleural effusion, not elsewhere classified: Secondary | ICD-10-CM | POA: Diagnosis not present

## 2023-12-08 DIAGNOSIS — M4855XA Collapsed vertebra, not elsewhere classified, thoracolumbar region, initial encounter for fracture: Secondary | ICD-10-CM | POA: Diagnosis not present

## 2023-12-08 DIAGNOSIS — W19XXXA Unspecified fall, initial encounter: Secondary | ICD-10-CM | POA: Insufficient documentation

## 2023-12-08 DIAGNOSIS — D500 Iron deficiency anemia secondary to blood loss (chronic): Secondary | ICD-10-CM | POA: Diagnosis not present

## 2023-12-08 DIAGNOSIS — Z87891 Personal history of nicotine dependence: Secondary | ICD-10-CM | POA: Diagnosis not present

## 2023-12-08 DIAGNOSIS — D649 Anemia, unspecified: Secondary | ICD-10-CM | POA: Diagnosis not present

## 2023-12-08 DIAGNOSIS — I10 Essential (primary) hypertension: Secondary | ICD-10-CM | POA: Insufficient documentation

## 2023-12-08 DIAGNOSIS — R2681 Unsteadiness on feet: Secondary | ICD-10-CM | POA: Insufficient documentation

## 2023-12-08 DIAGNOSIS — S22080A Wedge compression fracture of T11-T12 vertebra, initial encounter for closed fracture: Secondary | ICD-10-CM | POA: Diagnosis present

## 2023-12-08 DIAGNOSIS — Z79899 Other long term (current) drug therapy: Secondary | ICD-10-CM | POA: Insufficient documentation

## 2023-12-08 DIAGNOSIS — Z853 Personal history of malignant neoplasm of breast: Secondary | ICD-10-CM | POA: Diagnosis not present

## 2023-12-08 DIAGNOSIS — M5134 Other intervertebral disc degeneration, thoracic region: Secondary | ICD-10-CM | POA: Diagnosis not present

## 2023-12-08 DIAGNOSIS — M4856XA Collapsed vertebra, not elsewhere classified, lumbar region, initial encounter for fracture: Secondary | ICD-10-CM | POA: Diagnosis not present

## 2023-12-08 DIAGNOSIS — M549 Dorsalgia, unspecified: Principal | ICD-10-CM

## 2023-12-08 DIAGNOSIS — E559 Vitamin D deficiency, unspecified: Secondary | ICD-10-CM | POA: Diagnosis not present

## 2023-12-08 DIAGNOSIS — E785 Hyperlipidemia, unspecified: Secondary | ICD-10-CM | POA: Diagnosis not present

## 2023-12-08 DIAGNOSIS — M4854XA Collapsed vertebra, not elsewhere classified, thoracic region, initial encounter for fracture: Secondary | ICD-10-CM | POA: Diagnosis not present

## 2023-12-08 DIAGNOSIS — S32010A Wedge compression fracture of first lumbar vertebra, initial encounter for closed fracture: Secondary | ICD-10-CM | POA: Diagnosis not present

## 2023-12-08 DIAGNOSIS — M5136 Other intervertebral disc degeneration, lumbar region with discogenic back pain only: Secondary | ICD-10-CM | POA: Diagnosis not present

## 2023-12-08 DIAGNOSIS — M40204 Unspecified kyphosis, thoracic region: Secondary | ICD-10-CM | POA: Diagnosis not present

## 2023-12-08 DIAGNOSIS — S22059A Unspecified fracture of T5-T6 vertebra, initial encounter for closed fracture: Secondary | ICD-10-CM | POA: Diagnosis present

## 2023-12-08 DIAGNOSIS — M5135 Other intervertebral disc degeneration, thoracolumbar region: Secondary | ICD-10-CM | POA: Diagnosis not present

## 2023-12-08 LAB — CBC WITH DIFFERENTIAL/PLATELET
Abs Immature Granulocytes: 0.04 K/uL (ref 0.00–0.07)
Basophils Absolute: 0 K/uL (ref 0.0–0.1)
Basophils Relative: 1 %
Eosinophils Absolute: 0.3 K/uL (ref 0.0–0.5)
Eosinophils Relative: 4 %
HCT: 33.2 % — ABNORMAL LOW (ref 36.0–46.0)
Hemoglobin: 10.9 g/dL — ABNORMAL LOW (ref 12.0–15.0)
Immature Granulocytes: 1 %
Lymphocytes Relative: 15 %
Lymphs Abs: 1 K/uL (ref 0.7–4.0)
MCH: 32.4 pg (ref 26.0–34.0)
MCHC: 32.8 g/dL (ref 30.0–36.0)
MCV: 98.8 fL (ref 80.0–100.0)
Monocytes Absolute: 0.6 K/uL (ref 0.1–1.0)
Monocytes Relative: 10 %
Neutro Abs: 4.7 K/uL (ref 1.7–7.7)
Neutrophils Relative %: 69 %
Platelets: 262 K/uL (ref 150–400)
RBC: 3.36 MIL/uL — ABNORMAL LOW (ref 3.87–5.11)
RDW: 13.7 % (ref 11.5–15.5)
WBC: 6.7 K/uL (ref 4.0–10.5)
nRBC: 0 % (ref 0.0–0.2)

## 2023-12-08 LAB — BASIC METABOLIC PANEL WITH GFR
Anion gap: 7 (ref 5–15)
BUN: 20 mg/dL (ref 8–23)
CO2: 27 mmol/L (ref 22–32)
Calcium: 9.5 mg/dL (ref 8.9–10.3)
Chloride: 108 mmol/L (ref 98–111)
Creatinine, Ser: 0.71 mg/dL (ref 0.44–1.00)
GFR, Estimated: >60 mL/min (ref >60)
Glucose, Bld: 93 mg/dL (ref 70–99)
Potassium: 4 mmol/L (ref 3.5–5.1)
Sodium: 142 mmol/L (ref 135–145)

## 2023-12-08 MED ORDER — OXYCODONE HCL 5 MG PO TABS
2.5000 mg | ORAL_TABLET | ORAL | Status: DC | PRN
  Administered 2023-12-08 – 2023-12-09 (×2): 2.5 mg via ORAL
  Filled 2023-12-08 (×3): qty 1

## 2023-12-08 MED ORDER — ENOXAPARIN SODIUM 30 MG/0.3ML IJ SOSY
30.0000 mg | PREFILLED_SYRINGE | INTRAMUSCULAR | Status: DC
  Administered 2023-12-08: 30 mg via SUBCUTANEOUS
  Filled 2023-12-08: qty 0.3

## 2023-12-08 MED ORDER — SENNA 8.6 MG PO TABS
1.0000 | ORAL_TABLET | Freq: Two times a day (BID) | ORAL | Status: DC
  Administered 2023-12-08 – 2023-12-12 (×8): 8.6 mg via ORAL
  Filled 2023-12-08 (×8): qty 1

## 2023-12-08 MED ORDER — FAMOTIDINE 20 MG PO TABS: 20.0000 mg | ORAL_TABLET | Freq: Every evening | ORAL | Status: DC | PRN

## 2023-12-08 MED ORDER — IRBESARTAN 150 MG PO TABS
150.0000 mg | ORAL_TABLET | Freq: Every day | ORAL | Status: DC
  Administered 2023-12-08 – 2023-12-10 (×3): 150 mg via ORAL
  Filled 2023-12-08 (×4): qty 1

## 2023-12-08 MED ORDER — ONDANSETRON HCL 4 MG/2ML IJ SOLN: 4.0000 mg | Freq: Four times a day (QID) | INTRAMUSCULAR | Status: DC | PRN

## 2023-12-08 MED ORDER — VITAMIN D 25 MCG (1000 UNIT) PO TABS
2000.0000 [IU] | ORAL_TABLET | Freq: Every day | ORAL | Status: DC
  Administered 2023-12-08 – 2023-12-12 (×5): 2000 [IU] via ORAL
  Filled 2023-12-08 (×5): qty 2

## 2023-12-08 MED ORDER — ONDANSETRON HCL 4 MG PO TABS: 4.0000 mg | ORAL_TABLET | Freq: Four times a day (QID) | ORAL | Status: DC | PRN

## 2023-12-08 MED ORDER — DICLOFENAC SODIUM 1 % EX GEL: 4.0000 g | Freq: Four times a day (QID) | CUTANEOUS | Status: DC | PRN

## 2023-12-08 MED ORDER — HYDROCORTISONE (PERIANAL) 2.5 % EX CREA
1.0000 | TOPICAL_CREAM | Freq: Two times a day (BID) | CUTANEOUS | Status: DC
  Administered 2023-12-10 – 2023-12-12 (×3): 1 via RECTAL
  Filled 2023-12-08: qty 28.35

## 2023-12-08 MED ORDER — FENTANYL CITRATE (PF) 50 MCG/ML IJ SOSY: 12.5000 ug | PREFILLED_SYRINGE | INTRAMUSCULAR | Status: DC | PRN

## 2023-12-08 MED ORDER — POLYETHYLENE GLYCOL 3350 17 G PO PACK
8.5000 g | PACK | Freq: Every day | ORAL | Status: DC
  Administered 2023-12-09 – 2023-12-12 (×3): 8.5 g via ORAL
  Filled 2023-12-08 (×4): qty 1

## 2023-12-08 MED ORDER — POLYETHYLENE GLYCOL 3350 17 G PO PACK: 17.0000 g | PACK | Freq: Every day | ORAL | Status: DC | PRN

## 2023-12-08 MED ORDER — GADOBUTROL 1 MMOL/ML IV SOLN
5.0000 mL | Freq: Once | INTRAVENOUS | Status: AC | PRN
  Administered 2023-12-08: 5 mL via INTRAVENOUS

## 2023-12-08 MED ORDER — LIDOCAINE 5 % EX PTCH
1.0000 | MEDICATED_PATCH | CUTANEOUS | Status: DC
  Administered 2023-12-09 – 2023-12-11 (×2): 1 via TRANSDERMAL
  Filled 2023-12-08 (×5): qty 1

## 2023-12-08 MED ORDER — ACETAMINOPHEN 325 MG PO TABS
650.0000 mg | ORAL_TABLET | Freq: Four times a day (QID) | ORAL | Status: DC | PRN
  Administered 2023-12-08 – 2023-12-09 (×2): 650 mg via ORAL
  Filled 2023-12-08 (×2): qty 2

## 2023-12-08 MED ORDER — OXYCODONE-ACETAMINOPHEN 5-325 MG PO TABS
1.0000 | ORAL_TABLET | Freq: Once | ORAL | Status: AC
  Administered 2023-12-08: 1 via ORAL
  Filled 2023-12-08: qty 1

## 2023-12-08 MED ORDER — ACETAMINOPHEN 650 MG RE SUPP: 650.0000 mg | Freq: Four times a day (QID) | RECTAL | Status: DC | PRN

## 2023-12-08 MED ORDER — CALCIUM CARBONATE 1250 (500 CA) MG PO TABS
1.0000 | ORAL_TABLET | Freq: Every day | ORAL | Status: DC
  Administered 2023-12-09 – 2023-12-12 (×4): 1250 mg via ORAL
  Filled 2023-12-08 (×4): qty 1

## 2023-12-08 NOTE — ED Provider Notes (Signed)
 [place] EMERGENCY DEPARTMENT AT [facility] Provider Note   CSN: [id number] Arrival date & time: 12/08/23  9167     Patient presents with: Back Pain   Victoria Long is a 89 y.o. female.   This is a 89 year old female presents with several weeks of low back pain.  Seen recently for same and had a CT scan which showed compression fractures as well as spinal stenosis.  States that for the past several days she has been able to get out of bed despite being treated with Tylenol  and Lidoderm  patches.  Denies any loss of bowel or bladder function.  Pain is characterized as sharp and worse with any movement.  It is localized to her mid thoracic as well as lower back.  Denies any history of trauma.       Prior to Admission medications   Medication Sig Start Date End Date Taking? Authorizing Provider  acetaminophen  (TYLENOL ) 325 MG tablet Take 500 mg by mouth in the morning, at noon, and at bedtime. 11/04/17   Magrinat, Sandria BROCKS, MD  calcium  carbonate (OS-CAL) 600 MG TABS Take 600 mg by mouth 2 (two) times daily with a meal.     [provider]  Cholecalciferol (VITAMIN D ) 1000 UNITS capsule Take 1,000 Units by mouth 2 (two) times daily.     [provider]  Cyanocobalamin  (VITAMIN B 12 PO) Take 1 capsule by mouth daily.    [provider]  diclofenac  Sodium (VOLTAREN ) 1 % GEL Apply 4 g topically 4 (four) times daily as needed. 09/26/23   Ozell Heron HERO, MD  famotidine  (PEPCID ) 20 MG tablet TAKE 1 TABLET BY MOUTH EVERY NIGHT AT BEDTIME 11/25/23   Ozell Heron HERO, MD  hydrocortisone  (ANUSOL -HC) 2.5 % rectal cream Place 1 Application rectally 2 (two) times daily. 06/18/23   Ozell Heron HERO, MD  lidocaine  (LIDODERM ) 5 % Place 1 patch onto the skin daily. Remove & Discard patch within 12 hours or as directed by MD 12/04/23   Neysa Caron PARAS, DO  LYSINE HCL PO Take 500 mg by mouth daily.     [provider]  Multiple Vitamins-Minerals  (MULTIVITAMIN PO) Take by mouth daily.    [provider]  olmesartan  (BENICAR ) 20 MG tablet Take 1 tablet (20 mg total) by mouth daily. 11/25/23   Ozell Heron HERO, MD    Allergies: Anastrozole  and Tramadol     Review of Systems  All other systems reviewed and are negative.   Updated Vital Signs BP (!) 167/69 (BP Location: Right Arm)   Pulse 63   Temp 98.2 F (36.8 C) (Oral)   Resp 16   Ht 1.575 m (5' 2)   Wt 45.4 kg   SpO2 96%   BMI 18.29 kg/m   Physical Exam Vitals and nursing note reviewed.  Constitutional:      General: She is not in acute distress.    Appearance: Normal appearance. She is well-developed. She is not toxic-appearing.  HENT:     Head: Normocephalic and atraumatic.  Eyes:     General: Lids are normal.     Conjunctiva/sclera: Conjunctivae normal.     Pupils: Pupils are equal, round, and reactive to light.  Neck:     Thyroid : No thyroid  mass.     Trachea: No tracheal deviation.  Cardiovascular:     Rate and Rhythm: Normal rate and regular rhythm.     Heart sounds: Normal heart sounds. No murmur heard.    No  gallop.  Pulmonary:     Effort: Pulmonary effort is normal. No respiratory distress.     Breath sounds: Normal breath sounds. No stridor. No decreased breath sounds, wheezing, rhonchi or rales.  Abdominal:     General: There is no distension.     Palpations: Abdomen is soft.     Tenderness: There is no abdominal tenderness. There is no rebound.  Musculoskeletal:        General: No tenderness. Normal range of motion.     Cervical back: Normal range of motion and neck supple.       Back:  Skin:    General: Skin is warm and dry.     Findings: No abrasion or rash.  Neurological:     Mental Status: She is alert and oriented to person, place, and time. Mental status is at baseline.     GCS: GCS eye subscore is 4. GCS verbal subscore is 5. GCS motor subscore is 6.     Cranial Nerves: No cranial nerve deficit.     Sensory: No sensory  deficit.     Motor: Motor function is intact.     Comments: Strength is 5/5 bilateral lower extremities  Psychiatric:        Attention and Perception: Attention normal.        Speech: Speech normal.        Behavior: Behavior normal.     (all labs ordered are listed, but only abnormal results are displayed) Labs Reviewed  CBC WITH DIFFERENTIAL/PLATELET  BASIC METABOLIC PANEL WITH GFR    EKG: None  Radiology: No results found.   Procedures   Medications Ordered in the ED - No data to display                                  Medical Decision Making Amount and/or Complexity of Data Reviewed Labs: ordered. Radiology: ordered.  Risk Prescription drug management.   Patient treated with pain medication here.  She had MRI of her thoracic and lumbar spine which showed no real new changes from her recent CT scan.  I did discuss the case with neurosurgery and they recommend TLSO for comfort.  Possible kyphoplasty by IR.  No surgical acute intervention needed at this time.  Will consult hospitalist for admission.     Final diagnoses:  None    ED Discharge Orders     None          Dasie Faden, MD 12/08/23 1551

## 2023-12-08 NOTE — H&P (Signed)
 History and Physical    Patient: Victoria Long FMW:[id number] DOB: 12/28/1928 DOA: 12/08/2023 DOS: the patient was seen and examined on 12/08/2023 PCP: Ozell Heron HERO, MD  Patient coming from: Home  Chief Complaint:  Chief Complaint  Patient presents with   Back Pain   HPI: Victoria Long is a 89 y.o. female with medical history significant of bilateral knees osteoarthritis, bilateral breast cancer, history of radiation therapy, prediabetes, vitamin D  deficiency, hyperlipidemia, melanoma who presented to the emergency department for the second time in the past 4 days due to back pain.  No radiation to lower extremities.  No fecal or urinary incontinence.  She was diagnosed with an L1 fracture last week.  She has not been able to get out of bed on her own despite taking Tylenol  and using Lidoderm  patches.  She denied fever, chills, rhinorrhea, sore throat, wheezing or hemoptysis.  No chest pain, palpitations, diaphoresis, PND, orthopnea or pitting edema of the lower extremities.  No abdominal pain, nausea, emesis, diarrhea, constipation, melena or hematochezia.  No flank pain, dysuria, frequency or hematuria.  No polyuria, polydipsia, polyphagia or blurred vision.   Lab work: CBC showed a white count of 6.7, hemoglobin 10.9 g/dL platelets 737.  BMP was normal.  Imaging: MRI of the thoracic spine showing a subacute T6 compression fracture with unchanged 75% height loss and persistent moderate marrow edema without retropulsion.  Small bilateral pleural effusions.  MRI of lumbar spine showed acute/subacute L1 compression fracture with mildly progressive height loss, now 55%.  No retropulsion.  Moderate spinal stenosis at L4-L5.  ED course: Initial vital signs were temperature 98.2 F, pulse 63, respirations 16, BP 167/69 mmHg and O2 sat 96% on room air.  Patient received oxycodone /acetaminophen  5/325 mg tablet p.o. x 1.   Review of Systems: As mentioned in the history of present  illness. All other systems reviewed and are negative. Past Medical History:  Diagnosis Date   Arthritis    knees   Breast cancer (HCC) 2019   Left Breast   Breast cancer (HCC) 2012   Right Breast   Cancer [facility]) 2012   right breast cancer   Hyperlipidemia    Melanoma (HCC) 2014   right humerus   Personal history of radiation therapy 2012   Right Breast Cancer   Personal history of radiation therapy 2019   Left Breast Cancer   Prediabetes    Vitamin D  deficiency    Past Surgical History:  Procedure Laterality Date   BREAST BIOPSY Right 05/28/2010   BREAST LUMPECTOMY Right 06/29/2010   with radiation no chemo   BREAST LUMPECTOMY Left 08/08/2017   CATARACT EXTRACTION Bilateral 2009   CESAREAN SECTION     MASTECTOMY, PARTIAL Left 07/29/2017   Procedure: LEFT BREAST PARTIAL MASTECTOMY;  Surgeon: Vernetta Berg, MD;  Location: [facility];  Service: General;  Laterality: Left;   MELANOMA EXCISION Right 2014   humerus   Social History:  reports that she quit smoking about 47 years ago. Her smoking use included cigarettes. She started smoking about 57 years ago. She has never used smokeless tobacco. She reports current alcohol use. She reports that she does not use drugs.  Allergies  Allergen Reactions   Anastrozole     Tramadol      Medication caused dizziness    Family History  Problem Relation Age of Onset   Arthritis Mother    Rheum arthritis Mother    Hypertension Sister    Cancer Sister  type unknown   Diabetes Maternal Grandmother    Heart attack Brother     Prior to Admission medications   Medication Sig Start Date End Date Taking? Authorizing Provider  acetaminophen  (TYLENOL ) 325 MG tablet Take 500 mg by mouth in the morning, at noon, and at bedtime. 11/04/17   Magrinat, Sandria BROCKS, MD  calcium  carbonate (OS-CAL) 600 MG TABS Take 600 mg by mouth 2 (two) times daily with a meal.     [provider]  Cholecalciferol (VITAMIN D ) 1000  UNITS capsule Take 1,000 Units by mouth 2 (two) times daily.     [provider]  Cyanocobalamin  (VITAMIN B 12 PO) Take 1 capsule by mouth daily.    [provider]  diclofenac  Sodium (VOLTAREN ) 1 % GEL Apply 4 g topically 4 (four) times daily as needed. 09/26/23   Ozell Heron HERO, MD  famotidine  (PEPCID ) 20 MG tablet TAKE 1 TABLET BY MOUTH EVERY NIGHT AT BEDTIME 11/25/23   Ozell Heron HERO, MD  hydrocortisone  (ANUSOL -HC) 2.5 % rectal cream Place 1 Application rectally 2 (two) times daily. 06/18/23   Ozell Heron HERO, MD  lidocaine  (LIDODERM ) 5 % Place 1 patch onto the skin daily. Remove & Discard patch within 12 hours or as directed by MD 12/04/23   Neysa Caron PARAS, DO  LYSINE HCL PO Take 500 mg by mouth daily.     [provider]  Multiple Vitamins-Minerals (MULTIVITAMIN PO) Take by mouth daily.    [provider]  olmesartan  (BENICAR ) 20 MG tablet Take 1 tablet (20 mg total) by mouth daily. 11/25/23   Ozell Heron HERO, MD    Physical Exam: Vitals:   12/08/23 0841 12/08/23 1146 12/08/23 1405  BP: (!) 167/69 (!) 156/58 (!) 151/72  Pulse: 63 (!) 54 64  Resp: 16 18 18   Temp: 98.2 F (36.8 C) 97.6 F (36.4 C)   TempSrc: Oral Oral   SpO2: 96% 96% 98%  Weight: 45.4 kg    Height: 5' 2 (1.575 m)     Physical Exam Vitals reviewed.  Constitutional:      General: She is awake. She is not in acute distress.    Appearance: She is ill-appearing.  HENT:     Head: Normocephalic.     Nose: No rhinorrhea.     Mouth/Throat:     Mouth: Mucous membranes are moist.  Eyes:     General: No scleral icterus.    Pupils: Pupils are equal, round, and reactive to light.  Neck:     Vascular: No JVD.  Cardiovascular:     Rate and Rhythm: Normal rate and regular rhythm.     Heart sounds: S1 normal and S2 normal.  Pulmonary:     Effort: Pulmonary effort is normal.     Breath sounds: Normal breath sounds. No wheezing, rhonchi or rales.  Abdominal:     General:  Bowel sounds are normal. There is no distension.     Palpations: Abdomen is soft.     Tenderness: There is no abdominal tenderness. There is no right CVA tenderness or left CVA tenderness.  Musculoskeletal:     Cervical back: Neck supple.     Right lower leg: No edema.     Left lower leg: No edema.  Skin:    General: Skin is warm and dry.  Neurological:     General: No focal deficit present.     Mental Status: She is alert and oriented to person, place, and time.  Psychiatric:  Mood and Affect: Mood normal.        Behavior: Behavior normal. Behavior is cooperative.    Data Reviewed:  Results are pending, will review when available.  Assessment and Plan: Principal Problem:   Closed compression fracture of thoracolumbar vertebra (HCC) Observation/MedSurg. Analgesics as needed. Antiemetics as needed. TLSO brace. IR will evaluate in AM. -Possible kyphoplasty this week. Follow CBC, CMP  in AM.  Active Problems:   Hyperlipidemia   Atherosclerosis of aorta Would benefit from therapy. Follow-up with primary care provider.    Vitamin D  deficiency Continue calcium  and vitamin D  supplementation.    Essential hypertension Continue olmesartan  20 mg p.o. daily or formulary equivalent. Monitor BP, renal function and electrolytes.    Normocytic anemia Monitor hematocrit and hemoglobin     Advance Care Planning:   Code Status: Full Code   Consults: Interventional radiology.  Family Communication: Her 2 sons were at bedside.  Severity of Illness: The appropriate patient status for this patient is OBSERVATION. Observation status is judged to be reasonable and necessary in order to provide the required intensity of service to ensure the patient's safety. The patient's presenting symptoms, physical exam findings, and initial radiographic and laboratory data in the context of their medical condition is felt to place them at decreased risk for further clinical deterioration.  Furthermore, it is anticipated that the patient will be medically stable for discharge from the hospital within 2 midnights of admission.   Author: Alm Dorn Castor, MD 12/08/2023 4:06 PM  For on call review [email or web address].   This document was prepared using Dragon voice recognition software and may contain some unintended transcription errors.

## 2023-12-08 NOTE — ED Triage Notes (Signed)
 Pt BIB EMS from Home due to Low back pain. Pt diagnosed with L1 fracture w/ spinal stenosis Monday. Pt family reports pain meds not affective with side effects of hallucinations.  BP 154/70 HR 68 SpO2 98%\ CBG 110

## 2023-12-08 NOTE — Progress Notes (Signed)
 Orthopedic Tech Progress Note Patient Details:  Victoria Long 1928-04-02 [id number] Applied TLSO brace per order. Pt is being admitted.  Ortho Devices Type of Ortho Device: Thoracolumbar corset (TLSO) Ortho Device/Splint Interventions: Ordered, Application, Adjustment   Post Interventions Patient Tolerated: Well Instructions Provided: Adjustment of device, Poper ambulation with device, Care of device  Morna Pink 12/08/2023, 4:37 PM

## 2023-12-09 DIAGNOSIS — S32010A Wedge compression fracture of first lumbar vertebra, initial encounter for closed fracture: Secondary | ICD-10-CM | POA: Diagnosis not present

## 2023-12-09 DIAGNOSIS — S22080A Wedge compression fracture of T11-T12 vertebra, initial encounter for closed fracture: Secondary | ICD-10-CM | POA: Diagnosis not present

## 2023-12-09 LAB — COMPREHENSIVE METABOLIC PANEL WITH GFR
ALT: 14 U/L (ref 0–44)
AST: 21 U/L (ref 15–41)
Albumin: 3.6 g/dL (ref 3.5–5.0)
Alkaline Phosphatase: 77 U/L (ref 38–126)
Anion gap: 11 (ref 5–15)
BUN: 19 mg/dL (ref 8–23)
CO2: 25 mmol/L (ref 22–32)
Calcium: 9.8 mg/dL (ref 8.9–10.3)
Chloride: 106 mmol/L (ref 98–111)
Creatinine, Ser: 0.57 mg/dL (ref 0.44–1.00)
GFR, Estimated: >60 mL/min (ref >60)
Glucose, Bld: 99 mg/dL (ref 70–99)
Potassium: 3.8 mmol/L (ref 3.5–5.1)
Sodium: 142 mmol/L (ref 135–145)
Total Bilirubin: 0.5 mg/dL (ref 0.0–1.2)
Total Protein: 6.5 g/dL (ref 6.5–8.1)

## 2023-12-09 LAB — CBC
HCT: 37.1 % (ref 36.0–46.0)
Hemoglobin: 12.1 g/dL (ref 12.0–15.0)
MCH: 31.3 pg (ref 26.0–34.0)
MCHC: 32.6 g/dL (ref 30.0–36.0)
MCV: 95.9 fL (ref 80.0–100.0)
Platelets: 313 K/uL (ref 150–400)
RBC: 3.87 MIL/uL (ref 3.87–5.11)
RDW: 13.5 % (ref 11.5–15.5)
WBC: 8.4 K/uL (ref 4.0–10.5)
nRBC: 0 % (ref 0.0–0.2)

## 2023-12-09 LAB — PROTIME-INR
INR: 1.1 (ref 0.8–1.2)
Prothrombin Time: 15 s (ref 11.4–15.2)

## 2023-12-09 LAB — VITAMIN D 25 HYDROXY (VIT D DEFICIENCY, FRACTURES): Vit D, 25-Hydroxy: 62.63 ng/mL (ref 30–100)

## 2023-12-09 MED ORDER — ENOXAPARIN SODIUM 30 MG/0.3ML IJ SOSY
30.0000 mg | PREFILLED_SYRINGE | INTRAMUSCULAR | Status: DC
  Filled 2023-12-09: qty 0.3

## 2023-12-09 MED ORDER — ACETAMINOPHEN 325 MG PO TABS
975.0000 mg | ORAL_TABLET | Freq: Four times a day (QID) | ORAL | Status: DC
  Administered 2023-12-09 – 2023-12-12 (×10): 975 mg via ORAL
  Filled 2023-12-09 (×10): qty 3

## 2023-12-09 MED ORDER — HYDROMORPHONE HCL 1 MG/ML IJ SOLN: 0.5000 mg | INTRAMUSCULAR | Status: DC | PRN

## 2023-12-09 MED ORDER — HYDRALAZINE HCL 25 MG PO TABS
25.0000 mg | ORAL_TABLET | Freq: Four times a day (QID) | ORAL | Status: DC | PRN
  Administered 2023-12-09: 25 mg via ORAL
  Filled 2023-12-09: qty 1

## 2023-12-09 MED ORDER — DICLOFENAC SODIUM 1 % EX GEL
2.0000 g | Freq: Four times a day (QID) | CUTANEOUS | Status: DC
  Administered 2023-12-09 – 2023-12-12 (×11): 2 g via TOPICAL
  Filled 2023-12-09: qty 100

## 2023-12-09 NOTE — Progress Notes (Signed)
 PROGRESS NOTE  Victoria Long FMW:[id number] DOB: December 01, 1928   PCP: Ozell Heron HERO, MD  Patient is from: Home.  Uses walker for ambulation.  DOA: 12/08/2023 LOS: 0  Chief complaints Chief Complaint  Patient presents with   Back Pain     Brief Narrative / Interim history: 89 year old F with PMH of bilateral knee osteoarthritis, stage Ia bilateral breast cancer s/p lumpectomy and radiations, pancreatic mass, prediabetes, vitamin D  deficiency and hyperlipidemia presented to ED for the second time in 4 days due to acute back pain, and found to have T6 and L1 compression fractures with 75% and 55% weight loss respectively without significant retropulsion as noted on MRI.  Neurosurgery consulted and recommended TLSO for comfort and possible kyphoplasty by IR.  IR consulted.   Subjective: Seen and examined earlier this morning.  No major events overnight or this morning.  No complaints at the moment other than chronic bilateral knee pain from osteoarthritis.  She denies focal weakness, numbness or tingling.  She denies bowel or bladder habit change.   Assessment and plan: T6 compression fracture with 75% height loss without significant retropulsion or stenosis L1 compression fracture with 55% height loss without significant retropulsion -No focal neurodeficits, bowel or bladder habit change. -Multimodal pain control with scheduled Tylenol , as needed oxycodone  and Dilaudid. -Bowel regimen for constipation. -Per EDP, NSGY recommended TLSO brace for comfort and possible kyphoplasty -IR consulted for kyphoplasty. -Check vitamin D  level - PT/OT eval  Bilateral knee osteoarthritis -Pain medications as above -Topical Voltaren  gel   Essential hypertension: Elevated BP earlier this morning likely due to pain.  Now soft BP. -Closely monitor -Continue Avapro for now   Normocytic anemia: Stable.  Nutrition Body mass index is 18.29 kg/m. - Consult dietitian          DVT  prophylaxis:  enoxaparin (LOVENOX) injection 30 mg Start: 12/08/23 2200  Code Status: Full code Family Communication: None at bedside Level of care: Med-Surg Status is: Observation The patient will require care spanning > 2 midnights and should be moved to inpatient because: Acute back pain due to thoracic and lumbar compression fracture   Final disposition: To be determined   55 minutes with more than 50% spent in reviewing records, counseling patient/family and coordinating care.  Consultants:  Neurosurgery Interventional radiology  Procedures: None  Microbiology summarized: None  Objective: Vitals:   12/08/23 2004 12/09/23 0144 12/09/23 0644 12/09/23 0932  BP: (!) 141/61 (!) 172/80 (!) 171/87 (!) 118/57  Pulse: 73 80 (!) 103 86  Resp: 18 18 18 16   Temp: 97.9 F (36.6 C) (!) 97.5 F (36.4 C) 98.5 F (36.9 C) (!) 97.4 F (36.3 C)  TempSrc: Oral Oral Oral Oral  SpO2: 93% 99% 94% 100%  Weight:      Height:        Examination:  GENERAL: No apparent distress.  Appears frail. HEENT: MMM.  Vision and hearing grossly intact.  NECK: Supple.  No apparent JVD.  RESP:  No IWOB.  Fair aeration bilaterally. CVS:  RRR. Heart sounds normal.  ABD/GI/GU: Soft brace in place. MSK/EXT:  Moves all extremities. No apparent deformity. No edema.  SKIN: no apparent skin lesion or wound NEURO: AA.  Oriented appropriately.  No apparent focal neuro deficit. PSYCH: Calm. Normal affect.   Sch Meds:  Scheduled Meds:  acetaminophen   975 mg Oral Q6H WA   calcium  carbonate  1 tablet Oral Q breakfast   cholecalciferol  2,000 Units Oral Daily   diclofenac  Sodium  2 g Topical QID   enoxaparin (LOVENOX) injection  30 mg Subcutaneous Q24H   hydrocortisone   1 Application Rectal BID   irbesartan  150 mg Oral Daily   lidocaine   1 patch Transdermal Q24H   polyethylene glycol  8.5 g Oral Daily   senna  1 tablet Oral BID   Continuous Infusions: PRN Meds:.famotidine , hydrALAZINE,  HYDROmorphone (DILAUDID) injection, ondansetron  **OR** ondansetron  (ZOFRAN ) IV, oxyCODONE , polyethylene glycol  Antimicrobials: Anti-infectives (From admission, onward)    None        I have personally reviewed the following labs and images: CBC: Recent Labs  Lab 12/04/23 1636 12/08/23 1015 12/09/23 0519  WBC 7.3 6.7 8.4  NEUTROABS  --  4.7  --   HGB 11.0* 10.9* 12.1  HCT 33.7* 33.2* 37.1  MCV 96.6 98.8 95.9  PLT 249 262 313   BMP &GFR Recent Labs  Lab 12/04/23 1636 12/08/23 1015 12/09/23 0519  NA 142 142 142  K 4.0 4.0 3.8  CL 106 108 106  CO2 27 27 25   GLUCOSE 107* 93 99  BUN 23 20 19   CREATININE 0.72 0.71 0.57  CALCIUM  9.6 9.5 9.8   Estimated Creatinine Clearance: 31.5 mL/min (by C-G formula based on SCr of 0.57 mg/dL). Liver & Pancreas: Recent Labs  Lab 12/04/23 1636 12/09/23 0519  AST 17 21  ALT 13 14  ALKPHOS 66 77  BILITOT 0.3 0.5  PROT 6.2* 6.5  ALBUMIN 3.7 3.6   No results for input(s): LIPASE, AMYLASE in the last 168 hours. No results for input(s): AMMONIA in the last 168 hours. Diabetic: No results for input(s): HGBA1C in the last 72 hours. No results for input(s): GLUCAP in the last 168 hours. Cardiac Enzymes: No results for input(s): CKTOTAL, CKMB, CKMBINDEX, TROPONINI in the last 168 hours. No results for input(s): PROBNP in the last 8760 hours. Coagulation Profile: Recent Labs  Lab 12/09/23 1107  INR 1.1   Thyroid  Function Tests: No results for input(s): TSH, T4TOTAL, FREET4, T3FREE, THYROIDAB in the last 72 hours. Lipid Profile: No results for input(s): CHOL, HDL, LDLCALC, TRIG, CHOLHDL, LDLDIRECT in the last 72 hours. Anemia Panel: No results for input(s): VITAMINB12, FOLATE, FERRITIN, TIBC, IRON, RETICCTPCT in the last 72 hours. Urine analysis:    Component Value Date/Time   COLORURINE YELLOW 12/04/2023 1728   APPEARANCEUR CLEAR 12/04/2023 1728   LABSPEC 1.023  12/04/2023 1728   PHURINE 6.5 12/04/2023 1728   GLUCOSEU NEGATIVE 12/04/2023 1728   HGBUR NEGATIVE 12/04/2023 1728   BILIRUBINUR NEGATIVE 12/04/2023 1728   BILIRUBINUR negative 10/23/2023 1440   KETONESUR TRACE (A) 12/04/2023 1728   PROTEINUR TRACE (A) 12/04/2023 1728   UROBILINOGEN negative (A) 10/23/2023 1440   UROBILINOGEN 0.2 08/09/2014 1505   NITRITE NEGATIVE 12/04/2023 1728   LEUKOCYTESUR NEGATIVE 12/04/2023 1728   Sepsis Labs: Invalid input(s): PROCALCITONIN, LACTICIDVEN  Microbiology: No results found for this or any previous visit (from the past 240 hours).  Radiology Studies: MR Lumbar Spine W Wo Contrast Result Date: 12/08/2023 EXAM: MRI LUMBAR SPINE 12/08/2023 01:53:00 PM TECHNIQUE: Multiplanar multisequence MRI of the lumbar spine was performed with and without the administration of intravenous contrast. COMPARISON: Lumbar spine CT 12/04/2023. CLINICAL HISTORY: Compression fracture, lumbar. Worsening low back pain. FINDINGS: BONES AND ALIGNMENT: 5 lumbar-type vertebrae. Slight upper lumbar levoscoliosis. Unchanged grade 1 anterolisthesis of L4 and L5. L1 compression fracture with mildly progressive vertebral body height loss, now measuring 55%. Marrow edema throughout the L1 vertebral body. No significant retropulsion. No mass. SPINAL CORD:  The conus medullaris terminates at L2 and is normal in signal. SOFT TISSUES: Mild paraspinal soft tissue edema in the upper lumbar spine. DISC LEVELS: Disc desiccation throughout the lumbar spine. Moderate disc space narrowing at L4-L5 and L5-S1. T12-L1: Minimal disc bulging without stenosis. L1-L2: Mild disc bulging and mild facet and ligamentum flavum hypertrophy without stenosis. L2-L3: Disc bulging and moderate facet and ligamentum flavum hypertrophy without stenosis. L3-L4: Disc bulging and severe facet and ligamentum flavum hypertrophy result in mild right neural foraminal stenosis without spinal stenosis. L4-L5: Anterolisthesis with  diffuse bulging of uncovered disc and severe facet and ligamentum flavum hypertrophy result in moderate spinal stenosis, moderate bilateral lateral recess stenosis, and mild right and moderate left neural foraminal stenosis. L5-S1: Disc bulging and moderate facet and ligamentum flavum hypertrophy result in mild bilateral lateral recess stenosis. IMPRESSION: 1. Acute/subacute L1 compression fracture with mildly progressive height loss, now 55%. No retropulsion. 2. Moderate spinal stenosis at L4-L5. Electronically signed by: Dasie Hamburg MD 12/08/2023 02:44 PM EST RP Workstation: [id number]   MR THORACIC SPINE W WO CONTRAST Result Date: 12/08/2023 EXAM: MRI THORACIC SPINE WITHOUT AND WITH INTRAVENOUS CONTRAST 12/08/2023 01:53:00 PM TECHNIQUE: Multiplanar multisequence MRI of the thoracic spine was performed without and with the administration of intravenous contrast. COMPARISON: Thoracic spine CT 12/04/2023 and MRI 10/09/2023. CLINICAL HISTORY: Compression fracture, thoracic. Worsening back pain. FINDINGS: BONES AND ALIGNMENT: Exaggerated thoracic kyphosis. No significant listhesis. T6 compression fracture with unchanged 75% vertebral body height loss and persistent moderate marrow edema without retropulsion. No new thoracic compression fracture. Unchanged small sclerotic foci in the T8, T9, and T11 vertebral bodies and right T9 transverse process as previously noted, possibly bone islands. No suspicious enhancing lesion. Atypical hemangioma in the C6 vertebral body. SPINAL CORD: Normal spinal cord volume. Normal spinal cord signal. SOFT TISSUES: Small bilateral pleural effusions, left larger than right. DEGENERATIVE CHANGES: Incompletely evaluated degenerative changes in the cervical spine. No more than mild disc degeneration and facet arthrosis in the thoracic spine without high grade spinal canal or neural foraminal stenosis. Disc bulging at T6-T7 contacts the ventral spinal cord. IMPRESSION: 1. Subacute T6  compression fracture with unchanged 75% height loss and persistent moderate marrow edema without retropulsion. 2. No new thoracic spine fracture or compressive stenosis. 3. Small bilateral pleural effusions. Electronically signed by: Dasie Hamburg MD 12/08/2023 02:33 PM EST RP Workstation: [id number]      Ujbz T. Dodi Leu [facility]  If 7PM-7AM, please contact night-coverage [email or web address] 12/09/2023, 12:03 PM

## 2023-12-09 NOTE — Consult Note (Incomplete)
 Chief Complaint: Low back pain, acute/subacute L1 compression fracture; referred for L1 kyphoplasty  Referring Provider(s): Ortiz,D  Supervising Physician: Johann Sieving  Patient Status: [facility] - In-pt  History of Present Illness: Victoria Long is a 89 y.o. female ex smoker with past medical history significant for osteoarthritis , bilateral breast cancer, hyperlipidemia, melanoma, vitamin D  deficiency, prediabetes who was recently admitted to [facility] for the second time in the past 4 days due to persistent low back pain.  No history of radiation of pain to the lower extremities or fecal or urinary incontinence.  No recent fall but patient did report that she recently lost her balance at home but caught herself and subsequently developed some back pain afterwards.  MRI thoracic/lumbar spine on 12/08/23 revealed subacute T6 compression fracture as well as acute/subacute L1 fracture.  Patient currently denies any upper back pain.  She previously had  TLSO brace in place.  Her lower back pain is exacerbated with any movement.  She currently rates her low back pain as 7 out of 10 and despite use of oxycodone , Lidoderm  patch, Dilaudid, Voltaren  gel and acetaminophen  she continues to have breakthrough pain.  UA had negative nitrite or leukocytes.  Request now received from primary care team for consideration of kyphoplasty.   Patient is Full Code  Past Medical History:  Diagnosis Date   Arthritis    knees   Breast cancer (HCC) 2019   Left Breast   Breast cancer (HCC) 2012   Right Breast   Cancer [facility]) 2012   right breast cancer   Hyperlipidemia    Melanoma (HCC) 2014   right humerus   Personal history of radiation therapy 2012   Right Breast Cancer   Personal history of radiation therapy 2019   Left Breast Cancer   Prediabetes    Vitamin D  deficiency     Past Surgical History:  Procedure Laterality Date   BREAST BIOPSY Right 05/28/2010   BREAST LUMPECTOMY  Right 06/29/2010   with radiation no chemo   BREAST LUMPECTOMY Left 08/08/2017   CATARACT EXTRACTION Bilateral 2009   CESAREAN SECTION     MASTECTOMY, PARTIAL Left 07/29/2017   Procedure: LEFT BREAST PARTIAL MASTECTOMY;  Surgeon: Vernetta Berg, MD;  Location: [facility];  Service: General;  Laterality: Left;   MELANOMA EXCISION Right 2014   humerus    Allergies: Anastrozole  and Tramadol   Medications: Prior to Admission medications   Medication Sig Start Date End Date Taking? Authorizing Provider  ADVIL 200 MG CAPS Take 400 mg by mouth See admin instructions. Take 400 mg by mouth every 4-5 hours ROTATING WITH 1,000 mg (of) Tylenol    Yes [provider]  ARTIFICIAL TEARS 1 % ophthalmic solution Place 1 drop into both eyes 3 (three) times daily as needed (for dryness).   Yes [provider]  calcium  carbonate (OS-CAL) 600 MG TABS Take 600 mg by mouth daily with breakfast.   Yes [provider]  Cholecalciferol (VITAMIN D3) 50 MCG (2000 UT) TABS Take 2,000 Units by mouth daily.   Yes [provider]  Cyanocobalamin  (VITAMIN B 12 PO) Take 1 tablet by mouth daily.   Yes [provider]  DEBROX 6.5 % OTIC solution Place 5 drops into both ears 2 (two) times daily as needed (as directed to clear the ear canals).   Yes [provider]  diclofenac  Sodium (VOLTAREN ) 1 % GEL Apply 4 g topically 4 (four) times daily as needed. Patient taking  differently: Apply 4 g topically 4 (four) times daily as needed (FOR PAIN). 09/26/23  Yes Ozell Heron HERO, MD  famotidine  (PEPCID ) 20 MG tablet TAKE 1 TABLET BY MOUTH EVERY NIGHT AT BEDTIME Patient taking differently: Take 20 mg by mouth at bedtime as needed for heartburn or indigestion. 11/25/23  Yes Ozell Heron HERO, MD  hydrocortisone  (ANUSOL -HC) 2.5 % rectal cream Place 1 Application rectally 2 (two) times daily. 06/18/23  Yes Ozell Heron HERO, MD  lidocaine  (LIDODERM ) 5 % Place 1 patch  onto the skin daily. Remove & Discard patch within 12 hours or as directed by MD Patient taking differently: Place 1 patch onto the skin daily. Apply 1 new patch to the lower back and remove & discard patch within 12 hours or as directed by MD 12/04/23  Yes Young, Caron PARAS, DO  LYSINE HCL PO Take 500 mg by mouth daily.    Yes [provider]  MIRALAX 17 GM/SCOOP powder Take 8.5 g by mouth See admin instructions. Dissolve 8.5 grams into 4-8 ounces of coffee and drink by mouth in the morning   Yes [provider]  Multiple Vitamins-Minerals (MULTIVITAMIN PO) Take 1 tablet by mouth daily with breakfast.   Yes [provider]  olmesartan  (BENICAR ) 20 MG tablet Take 1 tablet (20 mg total) by mouth daily. 11/25/23  Yes Ozell Heron HERO, MD  TYLENOL  500 MG tablet Take 1,000 mg by mouth See admin instructions. Take 1,000 mg by mouth every 4-5 hours ROTATING WITH 400 mg (of) Advil   Yes [provider]     Family History  Problem Relation Age of Onset   Arthritis Mother    Rheum arthritis Mother    Hypertension Sister    Cancer Sister        type unknown   Diabetes Maternal Grandmother    Heart attack Brother     Social History   Socioeconomic History   Marital status: Widowed    Spouse name: Not on file   Number of children: 4   Years of education: Not on file   Highest education level: Not on file  Occupational History   Not on file  Tobacco Use   Smoking status: Former    Current packs/day: 0.00    Types: Cigarettes    Start date: 08/22/1966    Quit date: 08/21/1976    Years since quitting: 47.3   Smokeless tobacco: Never  Vaping Use   Vaping status: Never Used  Substance and Sexual Activity   Alcohol use: Yes    Alcohol/week: 0.0 - 2.0 standard drinks of alcohol    Comment: wine   Drug use: No   Sexual activity: Not Currently    Birth control/protection: Post-menopausal  Other Topics Concern   Not on file  Social History Narrative   Not on  file   Social Drivers of Health   Financial Resource Strain: Low Risk  (11/25/2023)   Overall Financial Resource Strain (CARDIA)    Difficulty of Paying Living Expenses: Not hard at all  Food Insecurity: No Food Insecurity (12/08/2023)   Hunger Vital Sign    Worried About Running Out of Food in the Last Year: Never true    Ran Out of Food in the Last Year: Never true  Transportation Needs: No Transportation Needs (12/08/2023)   PRAPARE - Administrator, Civil Service (Medical): No    Lack of Transportation (Non-Medical): No  Physical Activity: Inactive (11/25/2023)   Exercise Vital Sign  Days of Exercise per Week: 0 days    Minutes of Exercise per Session: 0 min  Stress: No Stress Concern Present (11/25/2023)   [employer] of Occupational Health - Occupational Stress Questionnaire    Feeling of Stress: Only a little  Social Connections: Socially Isolated (12/08/2023)   Social Connection and Isolation Panel    Frequency of Communication with Friends and Family: More than three times a week    Frequency of Social Gatherings with Friends and Family: More than three times a week    Attends Religious Services: Never    Database Administrator or Organizations: No    Attends Banker Meetings: Never    Marital Status: Widowed       Review of Systems: Currently denies fever, headache, chest pain, dyspnea, cough, abdominal pain, nausea, vomiting or bleeding.  Vital Signs: BP (!) 167/82 (BP Location: Left Arm)   Pulse 76   Temp 98.1 F (36.7 C) (Oral)   Resp 16   Ht 5' 2 (1.575 m)   Wt 100 lb (45.4 kg)   SpO2 97%   BMI 18.29 kg/m   Advance Care Plan: No documents on file    Physical Exam: Patient awake, alert.  Chest with slightly diminished breath sounds at bases.  Heart with regular rate rhythm.  Abdomen soft, positive bowel sounds, nontender.  No lower extremity edema.  Some palpable tenderness lower back region.i  Imaging: MR Lumbar Spine  W Wo Contrast Result Date: 12/08/2023 EXAM: MRI LUMBAR SPINE 12/08/2023 01:53:00 PM TECHNIQUE: Multiplanar multisequence MRI of the lumbar spine was performed with and without the administration of intravenous contrast. COMPARISON: Lumbar spine CT 12/04/2023. CLINICAL HISTORY: Compression fracture, lumbar. Worsening low back pain. FINDINGS: BONES AND ALIGNMENT: 5 lumbar-type vertebrae. Slight upper lumbar levoscoliosis. Unchanged grade 1 anterolisthesis of L4 and L5. L1 compression fracture with mildly progressive vertebral body height loss, now measuring 55%. Marrow edema throughout the L1 vertebral body. No significant retropulsion. No mass. SPINAL CORD: The conus medullaris terminates at L2 and is normal in signal. SOFT TISSUES: Mild paraspinal soft tissue edema in the upper lumbar spine. DISC LEVELS: Disc desiccation throughout the lumbar spine. Moderate disc space narrowing at L4-L5 and L5-S1. T12-L1: Minimal disc bulging without stenosis. L1-L2: Mild disc bulging and mild facet and ligamentum flavum hypertrophy without stenosis. L2-L3: Disc bulging and moderate facet and ligamentum flavum hypertrophy without stenosis. L3-L4: Disc bulging and severe facet and ligamentum flavum hypertrophy result in mild right neural foraminal stenosis without spinal stenosis. L4-L5: Anterolisthesis with diffuse bulging of uncovered disc and severe facet and ligamentum flavum hypertrophy result in moderate spinal stenosis, moderate bilateral lateral recess stenosis, and mild right and moderate left neural foraminal stenosis. L5-S1: Disc bulging and moderate facet and ligamentum flavum hypertrophy result in mild bilateral lateral recess stenosis. IMPRESSION: 1. Acute/subacute L1 compression fracture with mildly progressive height loss, now 55%. No retropulsion. 2. Moderate spinal stenosis at L4-L5. Electronically signed by: Dasie Hamburg MD 12/08/2023 02:44 PM EST RP Workstation: [id number]   MR THORACIC SPINE W WO  CONTRAST Result Date: 12/08/2023 EXAM: MRI THORACIC SPINE WITHOUT AND WITH INTRAVENOUS CONTRAST 12/08/2023 01:53:00 PM TECHNIQUE: Multiplanar multisequence MRI of the thoracic spine was performed without and with the administration of intravenous contrast. COMPARISON: Thoracic spine CT 12/04/2023 and MRI 10/09/2023. CLINICAL HISTORY: Compression fracture, thoracic. Worsening back pain. FINDINGS: BONES AND ALIGNMENT: Exaggerated thoracic kyphosis. No significant listhesis. T6 compression fracture with unchanged 75% vertebral body height loss and persistent moderate marrow edema  without retropulsion. No new thoracic compression fracture. Unchanged small sclerotic foci in the T8, T9, and T11 vertebral bodies and right T9 transverse process as previously noted, possibly bone islands. No suspicious enhancing lesion. Atypical hemangioma in the C6 vertebral body. SPINAL CORD: Normal spinal cord volume. Normal spinal cord signal. SOFT TISSUES: Small bilateral pleural effusions, left larger than right. DEGENERATIVE CHANGES: Incompletely evaluated degenerative changes in the cervical spine. No more than mild disc degeneration and facet arthrosis in the thoracic spine without high grade spinal canal or neural foraminal stenosis. Disc bulging at T6-T7 contacts the ventral spinal cord. IMPRESSION: 1. Subacute T6 compression fracture with unchanged 75% height loss and persistent moderate marrow edema without retropulsion. 2. No new thoracic spine fracture or compressive stenosis. 3. Small bilateral pleural effusions. Electronically signed by: Dasie Hamburg MD 12/08/2023 02:33 PM EST RP Workstation: [id number]   CT Lumbar Spine Wo Contrast Result Date: 12/04/2023 EXAM: CT OF THE LUMBAR SPINE WITHOUT CONTRAST 12/04/2023 04:49:00 PM TECHNIQUE: CT of the lumbar spine was performed without the administration of intravenous contrast. Multiplanar reformatted images are provided for review. Automated exposure control, iterative  reconstruction, and/or weight based adjustment of the mA/kV was utilized to reduce the radiation dose to as low as reasonably achievable. COMPARISON: MRI thoracic spine 10/09/2023 CLINICAL HISTORY: Low back pain, increased fracture risk. FINDINGS: BONES AND ALIGNMENT: 5 lumbar type vertebrae. Slight upper lumbar levoscoliosis. Trace retrolisthesis of L2 on L3. 4 mm anterolisthesis of L4 on L5. L1 inferior endplate compression fracture with 40% vertebral body height loss, new from the prior thoracic MRI. Diffuse osteopenia. No destructive bone lesion. DEGENERATIVE CHANGES: Moderate disc space narrowing at L4-L5 and L5-S1. No more than mild disc space narrowing elsewhere. Mild spinal stenosis at L3-L4 and moderate to severe stenosis at L4-L5 due to disc bulging and prominent posterior element hypertrophy. Mild to moderate left greater than right neural foraminal stenosis at L4-L5. SOFT TISSUES: Mild paravertebral soft tissue edema at L1. Aortic atherosclerosis. Bilateral renal sinus cysts for which no follow-up imaging is required. IMPRESSION: 1. L1 compression fracture with 40% vertebral body height loss, new from 10/09/2023. 2. Moderate to severe spinal stenosis at L4-L5 and mild spinal stenosis at L3-L4. Electronically signed by: Dasie Hamburg MD 12/04/2023 05:10 PM EST RP Workstation: [id number]   CT Thoracic Spine Wo Contrast Result Date: 12/04/2023 EXAM: CT THORACIC SPINE WITHOUT CONTRAST 12/04/2023 04:49:00 PM TECHNIQUE: CT of the thoracic spine was performed without the administration of intravenous contrast. Multiplanar reformatted images are provided for review. Automated exposure control, iterative reconstruction, and/or weight based adjustment of the mA/kV was utilized to reduce the radiation dose to as low as reasonably achievable. COMPARISON: MRI thoracic spine 10/09/2023. CLINICAL HISTORY: Mid-back pain, prior compression fracture. Low back pain for several weeks, worse for the past 3 days. FINDINGS:  BONES AND ALIGNMENT: T6 compression fracture with 75% vertebral body height loss, similar to the prior MRI. Heterogeneous sclerosis throughout the T6 vertebral body. No retropulsion. Background of diffuse osteopenia. No acute thoracic spine fracture. Small sclerotic foci in the T8, T9, and T11 vertebral bodies and right T9 transverse process, possibly bone islands. No destructive bone lesion. Mild mid to upper thoracic dextroscoliosis and mild lower thoracic levoscoliosis. Increased upper thoracic kyphosis. DEGENERATIVE CHANGES: Moderately advanced disc degeneration at C5-C6 and C6-C7. No more than mild multilevel disc degeneration in the thoracic spine. No evidence of high grade thoracic spinal canal or neural foraminal stenosis. SOFT TISSUES: Aortic and coronary atherosclerosis. Trace bilateral pleural effusions. Scarring/fibrosis in the  anterior left lung. IMPRESSION: 1. T6 compression fracture with 75% height loss, nonprogressed from 10/09/2023. 2. No acute thoracic spine fracture. 3. Mild thoracic disc degeneration without evidence of high-grade stenosis. 4. Trace bilateral pleural effusions. Electronically signed by: Dasie Hamburg MD 12/04/2023 05:05 PM EST RP Workstation: [id number]    Labs:  CBC: Recent Labs    04/15/23 1354 12/04/23 1636 12/08/23 1015 12/09/23 0519  WBC 5.5 7.3 6.7 8.4  HGB 12.4 11.0* 10.9* 12.1  HCT 37.7 33.7* 33.2* 37.1  PLT 250 249 262 313    COAGS: Recent Labs    12/09/23 1107  INR 1.1    BMP: Recent Labs    04/15/23 1354 11/25/23 1034 12/04/23 1636 12/08/23 1015 12/09/23 0519  NA 141 141 142 142 142  K 4.1 4.5 4.0 4.0 3.8  CL 104 106 106 108 106  CO2 26 29 27 27 25   GLUCOSE 80 88 107* 93 99  BUN 14 22 23 20 19   CALCIUM  10.0 9.4 9.6 9.5 9.8  CREATININE 0.64 0.71 0.72 0.71 0.57  GFRNONAA >60  --  >60 >60 >60    LIVER FUNCTION TESTS: Recent Labs    04/15/23 1354 11/25/23 1034 12/04/23 1636 12/09/23 0519  BILITOT 0.9 0.6 0.3 0.5  AST 23 18  17 21   ALT 16 14 13 14   ALKPHOS 40 49 66 77  PROT 6.3* 6.5 6.2* 6.5  ALBUMIN 3.8 3.9 3.7 3.6    TUMOR MARKERS: No results for input(s): AFPTM, CEA, CA199, CHROMGRNA in the last 8760 hours.  Assessment and Plan: 89 y.o. female ex smoker with past medical history significant for osteoarthritis , bilateral breast cancer, hyperlipidemia, melanoma, vitamin D  deficiency, prediabetes who was recently admitted to [facility] for the second time in the past 4 days due to persistent low back pain.  No history of radiation of pain to the lower extremities or fecal or urinary incontinence.  No recent fall but patient did report that she recently lost her balance at home but caught herself and subsequently developed some back pain afterwards.  MRI thoracic/lumbar spine on 12/08/23 revealed subacute T6 compression fracture as well as acute/subacute L1 fracture.  Patient currently denies any upper back pain.  She previously had TLSO brace in place.  Her lower back pain is exacerbated with any movement.  She currently rates her low back pain as 7 out of 10 and despite use of oxycodone , Lidoderm  patch, Dilaudid, Voltaren  gel and acetaminophen  she continues to have breakthrough pain.  UA had negative nitrite or leukocytes.  Request now received from primary care team for consideration of kyphoplasty.  Imaging studies have been reviewed by  Drs. Mugweru/Babcock and Hassell.  Patient appears to be suitable candidate for L1 kyphoplasty based on current symptoms .Risks and benefits of procedure were discussed with the patient/family including, but not limited to education regarding the natural healing process of compression fractures without intervention, bleeding, infection, cement migration which may cause spinal cord damage, paralysis, pulmonary embolism or even death.  This interventional procedure involves the use of X-rays and because of the nature of the planned procedure, it is possible that we will  have prolonged use of X-ray fluoroscopy.  Potential radiation risks to you include (but are not limited to) the following: - A slightly elevated risk for cancer  several years later in life. This risk is typically less than 0.5% percent. This risk is low in comparison to the normal incidence of human cancer, which is 33% for women  and 50% for men according to the American Cancer Society. - Radiation induced injury can include skin redness, resembling a rash, tissue breakdown / ulcers and hair loss (which can be temporary or permanent).   The likelihood of either of these occurring depends on the difficulty of the procedure and whether you are sensitive to radiation due to previous procedures, disease, or genetic conditions.   IF your procedure requires a prolonged use of radiation, you will be notified and given written instructions for further action.  It is your responsibility to monitor the irradiated area for the 2 weeks following the procedure and to notify your physician if you are concerned that you have suffered a radiation induced injury.    All of the patient's questions were answered, patient is agreeable to proceed.  Consent signed and in chart.  Procedure planned for 11/19 schedule permitting    Thank you for allowing our service to participate in Inocente Jeronimo Sprang 's care.  Electronically Signed: D. Franky Rakers, PA-C   12/09/2023, 4:20 PM      I spent a total of  40 minutes   in face to face in clinical consultation, greater than 50% of which was counseling/coordinating care for image guided lumbar 1 kyphoplasty

## 2023-12-09 NOTE — TOC Initial Note (Signed)
 Transition of Care [facility]) - Initial/Assessment Note    Patient Details  Name: Victoria Long MRN: [id number] Date of Birth: May 27, 1928  [facility] CM/SW Contact:    Alfonse JONELLE Rex, RN Phone Number: 12/09/2023, 2:46 PM  Clinical Narrative:    Met with patient at bedside to introduce role of INPT CM/NCM and review for dc planning, Patient reports she resides alone with support from her adult children, they assist with her personal care and provide meals as needed. Patient reports she has a  cane and RW at home she uses as needed for functional mobility. MOON reviewed with patient, patient did not want NCM to contact anyone for review MOON with them as well, patient declined to sign MOON. MOON completed by NCM and copy left at patient's bedside. PT eval pending, await recommendation.                Expected Discharge Plan: Home w Home Health Services Barriers to Discharge: Continued Medical Work up   Patient Goals and CMS Choice Patient states their goals for this hospitalization and ongoing recovery are:: return home          Expected Discharge Plan and Services       Living arrangements for the past 2 months: Single Family Home                                      Prior Living Arrangements/Services Living arrangements for the past 2 months: Single Family Home Lives with:: Self Patient language and need for interpreter reviewed:: Yes        Need for Family Participation in Patient Care: Yes (Comment) Care giver support system in place?: Yes (comment) Current home services: DME (RW, Cane) Criminal Activity/Legal Involvement Pertinent to Current Situation/Hospitalization: No - Comment as needed  Activities of Daily Living   ADL Screening (condition at time of admission) Independently performs ADLs?: Yes (appropriate for developmental age) Is the patient deaf or have difficulty hearing?: No Does the patient have difficulty seeing, even when  wearing glasses/contacts?: No Does the patient have difficulty concentrating, remembering, or making decisions?: No  Permission Sought/Granted                  Emotional Assessment Appearance:: Appears stated age Attitude/Demeanor/Rapport: Gracious Affect (typically observed): Accepting Orientation: : Oriented to Self, Oriented to Place Alcohol / Substance Use: Not Applicable Psych Involvement: No (comment)  Admission diagnosis:  Closed compression fracture of thoracolumbar vertebra (HCC) [S22.080A, S32.010A] Back pain, unspecified back location, unspecified back pain laterality, unspecified chronicity [M54.9] Patient Active Problem List   Diagnosis Date Noted   Closed compression fracture of thoracolumbar vertebra (HCC) 12/08/2023   Normocytic anemia 12/08/2023   External hemorrhoids 06/14/2020   Sigmoid diverticulosis 05/02/2020   Senile purpura 01/11/2019   Unilateral primary osteoarthritis, right knee 08/13/2018   Atherosclerosis of aorta 05/28/2018   Malignant neoplasm of upper-outer quadrant of left breast in female, estrogen receptor positive (HCC) 07/23/2017   Melanoma in situ of upper arm, right (HCC) 07/23/2017   At high risk for falls 07/23/2017   Invasive ductal carcinoma of breast, female, left (HCC) 07/15/2017   Essential hypertension 10/01/2016   Pancreatic mass 08/22/2016   History of right breast cancer 10/25/2013   Encounter for long-term (current) use of medications 04/19/2013   Hyperlipidemia    Abnormal glucose    Vitamin D  deficiency  PCP:  Ozell Heron HERO, MD Pharmacy:   [facility] [id number] - [address], [place] - [address] [place] [address] Phone: [phone] Fax: [phone]     Social Drivers of Health (SDOH) Social History: SDOH Screenings   Food Insecurity: No Food Insecurity (12/08/2023)  Housing: Low Risk  (12/08/2023)  Transportation Needs: No Transportation Needs (12/08/2023)   Utilities: Not At Risk (12/08/2023)  Alcohol Screen: Low Risk  (11/25/2023)  Depression (PHQ2-9): Low Risk  (11/25/2023)  Financial Resource Strain: Low Risk  (11/25/2023)  Physical Activity: Inactive (11/25/2023)  Social Connections: Socially Isolated (12/08/2023)  Stress: No Stress Concern Present (11/25/2023)  Tobacco Use: Medium Risk (12/08/2023)  Health Literacy: Adequate Health Literacy (11/25/2023)   SDOH Interventions:     Readmission Risk Interventions     No data to display

## 2023-12-09 NOTE — Care Management Obs Status (Signed)
 MEDICARE OBSERVATION STATUS NOTIFICATION   Patient Details  Name: Victoria Long MRN: [id number] Date of Birth: 02/07/1928   Medicare Observation Status Notification Given:  Yes    Alfonse JONELLE Rex, RN 12/09/2023, 2:09 PM

## 2023-12-10 ENCOUNTER — Observation Stay (HOSPITAL_COMMUNITY)

## 2023-12-10 DIAGNOSIS — E43 Unspecified severe protein-calorie malnutrition: Secondary | ICD-10-CM | POA: Insufficient documentation

## 2023-12-10 DIAGNOSIS — I10 Essential (primary) hypertension: Secondary | ICD-10-CM

## 2023-12-10 DIAGNOSIS — S32010A Wedge compression fracture of first lumbar vertebra, initial encounter for closed fracture: Secondary | ICD-10-CM | POA: Diagnosis not present

## 2023-12-10 DIAGNOSIS — D649 Anemia, unspecified: Secondary | ICD-10-CM

## 2023-12-10 DIAGNOSIS — M4856XA Collapsed vertebra, not elsewhere classified, lumbar region, initial encounter for fracture: Secondary | ICD-10-CM | POA: Diagnosis not present

## 2023-12-10 DIAGNOSIS — S22080A Wedge compression fracture of T11-T12 vertebra, initial encounter for closed fracture: Secondary | ICD-10-CM | POA: Diagnosis not present

## 2023-12-10 HISTORY — PX: IR KYPHO LUMBAR INC FX REDUCE BONE BX UNI/BIL CANNULATION INC/IMAGING: IMG5519

## 2023-12-10 LAB — CBC
HCT: 34.8 % — ABNORMAL LOW (ref 36.0–46.0)
Hemoglobin: 11.5 g/dL — ABNORMAL LOW (ref 12.0–15.0)
MCH: 31.9 pg (ref 26.0–34.0)
MCHC: 33 g/dL (ref 30.0–36.0)
MCV: 96.4 fL (ref 80.0–100.0)
Platelets: 310 K/uL (ref 150–400)
RBC: 3.61 MIL/uL — ABNORMAL LOW (ref 3.87–5.11)
RDW: 13.4 % (ref 11.5–15.5)
WBC: 6.4 K/uL (ref 4.0–10.5)
nRBC: 0 % (ref 0.0–0.2)

## 2023-12-10 LAB — RENAL FUNCTION PANEL
Albumin: 3.4 g/dL — ABNORMAL LOW (ref 3.5–5.0)
Anion gap: 10 (ref 5–15)
BUN: 19 mg/dL (ref 8–23)
CO2: 26 mmol/L (ref 22–32)
Calcium: 9.7 mg/dL (ref 8.9–10.3)
Chloride: 106 mmol/L (ref 98–111)
Creatinine, Ser: 0.6 mg/dL (ref 0.44–1.00)
GFR, Estimated: >60 mL/min (ref >60)
Glucose, Bld: 99 mg/dL (ref 70–99)
Phosphorus: 3.3 mg/dL (ref 2.5–4.6)
Potassium: 4 mmol/L (ref 3.5–5.1)
Sodium: 141 mmol/L (ref 135–145)

## 2023-12-10 LAB — MAGNESIUM: Magnesium: 2 mg/dL (ref 1.7–2.4)

## 2023-12-10 MED ORDER — MIDAZOLAM HCL 2 MG/2ML IJ SOLN
INTRAMUSCULAR | Status: AC
Start: 2023-12-10 — End: 2023-12-10
  Filled 2023-12-10: qty 2

## 2023-12-10 MED ORDER — FENTANYL CITRATE (PF) 100 MCG/2ML IJ SOLN
INTRAMUSCULAR | Status: AC | PRN
  Administered 2023-12-10: 25 ug via INTRAVENOUS

## 2023-12-10 MED ORDER — FENTANYL CITRATE (PF) 100 MCG/2ML IJ SOLN
INTRAMUSCULAR | Status: AC
  Filled 2023-12-10: qty 2

## 2023-12-10 MED ORDER — ENOXAPARIN SODIUM 30 MG/0.3ML IJ SOSY
30.0000 mg | PREFILLED_SYRINGE | INTRAMUSCULAR | Status: DC
  Administered 2023-12-11 – 2023-12-12 (×2): 30 mg via SUBCUTANEOUS
  Filled 2023-12-10 (×2): qty 0.3

## 2023-12-10 MED ORDER — LIDOCAINE HCL (PF) 1 % IJ SOLN
30.0000 mL | Freq: Once | INTRAMUSCULAR | Status: AC
  Administered 2023-12-10: 15 mL via INTRADERMAL
  Filled 2023-12-10: qty 30

## 2023-12-10 MED ORDER — LIDOCAINE HCL (PF) 1 % IJ SOLN
INTRAMUSCULAR | Status: AC
  Filled 2023-12-10: qty 30

## 2023-12-10 MED ORDER — CEFAZOLIN SODIUM-DEXTROSE 2-4 GM/100ML-% IV SOLN
2.0000 g | INTRAVENOUS | Status: AC
  Administered 2023-12-10: 2 g via INTRAVENOUS

## 2023-12-10 MED ORDER — IOHEXOL 300 MG/ML  SOLN
50.0000 mL | Freq: Once | INTRAMUSCULAR | Status: AC | PRN
Start: 2023-12-10 — End: 2023-12-10
  Administered 2023-12-10: 25 mL

## 2023-12-10 MED ORDER — MIDAZOLAM HCL (PF) 2 MG/2ML IJ SOLN
INTRAMUSCULAR | Status: AC | PRN
  Administered 2023-12-10: .5 mg via INTRAVENOUS

## 2023-12-10 MED ORDER — CEFAZOLIN SODIUM-DEXTROSE 2-4 GM/100ML-% IV SOLN
INTRAVENOUS | Status: AC
  Filled 2023-12-10: qty 100

## 2023-12-10 NOTE — Progress Notes (Signed)
 Initial Nutrition Assessment  DOCUMENTATION CODES:   Severe malnutrition in context of acute illness/injury, Underweight  INTERVENTION:   -Encourage good PO following procedure today  -Daily snacks like yogurt and milk with meals  -Ensure supplements if desired from unit  -Multivitamin with minerals daily  NUTRITION DIAGNOSIS:   Severe Malnutrition related to acute illness (compression fx of vertebra) as evidenced by severe fat depletion, severe muscle depletion, percent weight loss.  GOAL:   Patient will meet greater than or equal to 90% of their needs  MONITOR:   PO intake  REASON FOR ASSESSMENT:   Consult Assessment of nutrition requirement/status  ASSESSMENT:   89 year old F with PMH of bilateral knee osteoarthritis, stage Ia bilateral breast cancer s/p lumpectomy and radiations, pancreatic mass, prediabetes, vitamin D  deficiency and hyperlipidemia presented to ED for the second time in 4 days due to acute back pain, and found to have T6 and L1 compression fractures with 75% and 55% weight loss respectively without significant retropulsion as noted on MRI.  Neurosurgery consulted and recommended TLSO for comfort and possible kyphoplasty by IR.  Patient in room with daughter at bedside. Pt currently NPO awaiting kyphoplasty in IR today.  Pt and family both report her appetite has been very good lately, pt eating 3 meals a day with no issues. Denies any issues with swallowing or chewing or taste changes. Pt typically consumes oatmeal (or gravy biscuit from [employer]) for breakfast, lunch usually consists of leftovers from the previous night's dinner and then family brings pt a dinner meal which she will eat about 50% of (meat, veg, starch).  Pt and family think pt's appetite is fine, will monitor if protein supplements are needed. Aware they can request if needed from unit. Pt likes yogurt so will try and provide on meal trays once diet advanced.   Pt reports UBW ~125  lbs. Per weight records, pt has lost 7 lbs since 11/4 (6% wt loss x 2 weeks, significant for time frame).  Medications: OSCAL, Vitamin D , Miralax , Senokot  Labs reviewed.  NUTRITION - FOCUSED PHYSICAL EXAM:  Flowsheet Row Most Recent Value  Orbital Region Severe depletion  Upper Arm Region Unable to assess  Thoracic and Lumbar Region Unable to assess  Buccal Region Severe depletion  Temple Region Severe depletion  Clavicle Bone Region Severe depletion  Clavicle and Acromion Bone Region Severe depletion  Scapular Bone Region Unable to assess  Dorsal Hand Severe depletion  Patellar Region Unable to assess  Anterior Thigh Region Unable to assess  Posterior Calf Region Unable to assess  Edema (RD Assessment) None  Hair Reviewed  Eyes Reviewed  Mouth Reviewed  Skin Reviewed  Nails Reviewed    Diet Order:   Diet Order             Diet NPO time specified Except for: Sips with Meds  Diet effective midnight                   EDUCATION NEEDS:   Education needs have been addressed  Skin:  Skin Assessment: Reviewed RN Assessment  Last BM:  11/16  Height:   Ht Readings from Last 1 Encounters:  12/08/23 5' 2 (1.575 m)    Weight:   Wt Readings from Last 1 Encounters:  12/08/23 45.4 kg    BMI:  Body mass index is 18.29 kg/m.  Estimated Nutritional Needs:   Kcal:  1250-1450  Protein:  70-80g  Fluid:  1.5L/day   Morna Lee, MS, RD, LDN Inpatient  Clinical Dietitian Contact via Secure chat

## 2023-12-10 NOTE — Plan of Care (Signed)

## 2023-12-10 NOTE — Progress Notes (Signed)
 PROGRESS NOTE  Victoria Long FMW:[id number] DOB: 03-25-1928   PCP: Ozell Heron HERO, MD  Patient is from: Home.  Uses walker for ambulation.  DOA: 12/08/2023 LOS: 0  Chief complaints Chief Complaint  Patient presents with   Back Pain     Brief Narrative / Interim history: 89 year old F with PMH of bilateral knee osteoarthritis, stage Ia bilateral breast cancer s/p lumpectomy and radiations, pancreatic mass, prediabetes, vitamin D  deficiency and hyperlipidemia presented to ED for the second time in 4 days due to acute back pain, and found to have T6 and L1 compression fractures with 75% and 55% weight loss respectively without significant retropulsion as noted on MRI.  Neurosurgery consulted and recommended TLSO for comfort and possible kyphoplasty by IR.  IR consulted.   Subjective: Seen and examined earlier this morning.  No major events overnight or this morning.  No complaints other than some low back pain.  Pain is not severe.  Patient's son at bedside.   Assessment and plan: Subacute T6 compression fracture with 75% height loss without significant retropulsion or stenosis Acute L1 compression fracture with 55% height loss without significant retropulsion -No focal neurodeficits, bowel or bladder habit change.  Vitamin D  62.63. -Per EDP, NSGY recommended TLSO brace for comfort (can come off when in bed) and possible kyphoplasty. -Multimodal pain control with scheduled Tylenol , as needed oxycodone  and Dilaudid. -Bowel regimen -IR consulted for kyphoplasty. -PT/OT eval  Bilateral knee osteoarthritis -Pain medications as above -Continue topical Voltaren  gel   Essential hypertension: Elevated BP earlier this morning likely due to pain.  -Closely monitor -Continue Avapro for now   Normocytic anemia: Stable. - Continue monitoring  Nutrition Body mass index is 18.29 kg/m. - Consult dietitian          DVT prophylaxis:  enoxaparin (LOVENOX) injection 30 mg  Start: 12/10/23 2200  Code Status: Full code Family Communication: Updated patient's son at bedside. Level of care: Med-Surg Status is: Observation The patient will require care spanning > 2 midnights and should be moved to inpatient because: Acute back pain due to thoracic and lumbar compression fracture   Final disposition: To be determined   35 minutes with more than 50% spent in reviewing records, counseling patient/family and coordinating care.  Consultants:  Neurosurgery Interventional radiology  Procedures: None  Microbiology summarized: None  Objective: Vitals:   12/09/23 1414 12/09/23 1415 12/09/23 2018 12/10/23 0539  BP: (!) 162/77 (!) 167/82 (!) 144/73 (!) 153/73  Pulse: 76 76 77 72  Resp: 16 16 18 18   Temp: 98.1 F (36.7 C) 98.1 F (36.7 C) 98.5 F (36.9 C) 97.9 F (36.6 C)  TempSrc: Oral Oral Oral Oral  SpO2: 97% 97% 94% 96%  Weight:      Height:        Examination:  GENERAL: No apparent distress.  Appears frail. HEENT: MMM.  Vision and hearing grossly intact.  NECK: Supple.  No apparent JVD.  RESP:  No IWOB.  Fair aeration bilaterally. CVS:  RRR. Heart sounds normal.  ABD/GI/GU: Soft brace in place. MSK/EXT:  Moves all extremities. No apparent deformity. No edema.  SKIN: no apparent skin lesion or wound NEURO: AA.  Oriented appropriately.  No apparent focal neuro deficit. PSYCH: Calm. Normal affect.   Sch Meds:  Scheduled Meds:  acetaminophen   975 mg Oral Q6H WA   calcium  carbonate  1 tablet Oral Q breakfast   cholecalciferol  2,000 Units Oral Daily   diclofenac  Sodium  2 g Topical QID  enoxaparin (LOVENOX) injection  30 mg Subcutaneous Q24H   hydrocortisone   1 Application Rectal BID   irbesartan  150 mg Oral Daily   lidocaine   1 patch Transdermal Q24H   polyethylene glycol  8.5 g Oral Daily   senna  1 tablet Oral BID   Continuous Infusions: PRN Meds:.famotidine , hydrALAZINE, HYDROmorphone (DILAUDID) injection, ondansetron  **OR**  ondansetron  (ZOFRAN ) IV, oxyCODONE , polyethylene glycol  Antimicrobials: Anti-infectives (From admission, onward)    None        I have personally reviewed the following labs and images: CBC: Recent Labs  Lab 12/04/23 1636 12/08/23 1015 12/09/23 0519 12/10/23 0501  WBC 7.3 6.7 8.4 6.4  NEUTROABS  --  4.7  --   --   HGB 11.0* 10.9* 12.1 11.5*  HCT 33.7* 33.2* 37.1 34.8*  MCV 96.6 98.8 95.9 96.4  PLT 249 262 313 310   BMP &GFR Recent Labs  Lab 12/04/23 1636 12/08/23 1015 12/09/23 0519 12/10/23 0501  NA 142 142 142 141  K 4.0 4.0 3.8 4.0  CL 106 108 106 106  CO2 27 27 25 26   GLUCOSE 107* 93 99 99  BUN 23 20 19 19   CREATININE 0.72 0.71 0.57 0.60  CALCIUM  9.6 9.5 9.8 9.7  MG  --   --   --  2.0  PHOS  --   --   --  3.3   Estimated Creatinine Clearance: 31.5 mL/min (by C-G formula based on SCr of 0.6 mg/dL). Liver & Pancreas: Recent Labs  Lab 12/04/23 1636 12/09/23 0519 12/10/23 0501  AST 17 21  --   ALT 13 14  --   ALKPHOS 66 77  --   BILITOT 0.3 0.5  --   PROT 6.2* 6.5  --   ALBUMIN 3.7 3.6 3.4*   No results for input(s): LIPASE, AMYLASE in the last 168 hours. No results for input(s): AMMONIA in the last 168 hours. Diabetic: No results for input(s): HGBA1C in the last 72 hours. No results for input(s): GLUCAP in the last 168 hours. Cardiac Enzymes: No results for input(s): CKTOTAL, CKMB, CKMBINDEX, TROPONINI in the last 168 hours. No results for input(s): PROBNP in the last 8760 hours. Coagulation Profile: Recent Labs  Lab 12/09/23 1107  INR 1.1   Thyroid  Function Tests: No results for input(s): TSH, T4TOTAL, FREET4, T3FREE, THYROIDAB in the last 72 hours. Lipid Profile: No results for input(s): CHOL, HDL, LDLCALC, TRIG, CHOLHDL, LDLDIRECT in the last 72 hours. Anemia Panel: No results for input(s): VITAMINB12, FOLATE, FERRITIN, TIBC, IRON, RETICCTPCT in the last 72 hours. Urine analysis:     Component Value Date/Time   COLORURINE YELLOW 12/04/2023 1728   APPEARANCEUR CLEAR 12/04/2023 1728   LABSPEC 1.023 12/04/2023 1728   PHURINE 6.5 12/04/2023 1728   GLUCOSEU NEGATIVE 12/04/2023 1728   HGBUR NEGATIVE 12/04/2023 1728   BILIRUBINUR NEGATIVE 12/04/2023 1728   BILIRUBINUR negative 10/23/2023 1440   KETONESUR TRACE (A) 12/04/2023 1728   PROTEINUR TRACE (A) 12/04/2023 1728   UROBILINOGEN negative (A) 10/23/2023 1440   UROBILINOGEN 0.2 08/09/2014 1505   NITRITE NEGATIVE 12/04/2023 1728   LEUKOCYTESUR NEGATIVE 12/04/2023 1728   Sepsis Labs: Invalid input(s): PROCALCITONIN, LACTICIDVEN  Microbiology: No results found for this or any previous visit (from the past 240 hours).  Radiology Studies: No results found.     Tharon Kitch T. Kevontay Burks [facility]  If 7PM-7AM, please contact night-coverage [email or web address] 12/10/2023, 10:39 AM

## 2023-12-10 NOTE — Progress Notes (Signed)
 OT Cancellation Note  Patient Details Name: Victoria Long MRN: [id number] DOB: 07-11-1928   Cancelled Treatment:    Reason Eval/Treat Not Completed: Other (comment). Per son in room, IR came this morning and pt going for L1 Kyphoplasty today 12/10/23. Per son, pt has not tolerated the TLSO well, it is at bedside. OT will follow up tomorrow  Jacques Karna Loose 12/10/2023, 11:34 AM

## 2023-12-10 NOTE — Progress Notes (Signed)
 MEDICATION-RELATED CONSULT NOTE   IR Procedure Consult - Anticoagulant/Antiplatelet PTA/Inpatient Med List Review by Pharmacist    Procedure: Kyphoplasty lumber L1    Completed: 12/10/23 at 4:27 pm  Post-Procedural bleeding risk per IR MD assessment: High   Antithrombotic medications on inpatient or PTA profile prior to procedure:    - Lovenox 30 mg q24h for VTE pxx  Recommended restart time per IR Post-Procedure Guidelines:   - Day +1 (next AM)   Plan:     - Will resume lovenox 30 mg SQ on 12/11/23 at 10 AM - Pharmacy will sign off. Reconsult us  if need further assistance  Iantha Batch, PharmD, BCPS 12/10/2023 4:40 PM

## 2023-12-10 NOTE — Progress Notes (Signed)
 Pt. Came down for Kyphoplasty procedure in IR. Pt. Notably altered and very lethargic. She was able to state her name and DOB. When turning onto her stomach to get on procedure table she remained lethargic with little movement and no signs of pain. Pt. Received a small amount of sedation 0.5 mg Versed  and 75 mcg Fentanyl  for be comfortable for her procedure. Pt. Is alert to voice but quickly lethargic again.

## 2023-12-10 NOTE — Procedures (Signed)
  Procedure:  Kyphoplasty lumbar L1   Preprocedure diagnosis: The encounter diagnosis was Back pain, unspecified back location, unspecified back pain laterality, unspecified chronicity. Postprocedure diagnosis: same EBL:    minimal Complications:   none immediate  See full dictation in [employer].  CHARM Toribio Faes MD Main # [phone] Pager  [phone] Mobile [phone]

## 2023-12-10 NOTE — Progress Notes (Signed)
 PT Cancellation Note  Patient Details Name: Deepa Barthel MRN: [id number] DOB: 1928-06-18   Cancelled Treatment:    Reason Eval/Treat Not Completed: Other (comment) (kyphoplasty). Per son in room, IR came this morning and pt going for L1 Kyphoplasty today 12/10/23. Per son, pt has not tolerated the TLSO well, it is at bedside. Will return tomorrow for PT eval as appropriate.    Tori Alexandrea Westergard PT, DPT 12/10/23, 11:05 AM

## 2023-12-11 DIAGNOSIS — I700 Atherosclerosis of aorta: Secondary | ICD-10-CM

## 2023-12-11 DIAGNOSIS — E43 Unspecified severe protein-calorie malnutrition: Secondary | ICD-10-CM | POA: Diagnosis not present

## 2023-12-11 DIAGNOSIS — E785 Hyperlipidemia, unspecified: Secondary | ICD-10-CM

## 2023-12-11 DIAGNOSIS — S22080A Wedge compression fracture of T11-T12 vertebra, initial encounter for closed fracture: Secondary | ICD-10-CM | POA: Diagnosis not present

## 2023-12-11 DIAGNOSIS — I10 Essential (primary) hypertension: Secondary | ICD-10-CM | POA: Diagnosis not present

## 2023-12-11 MED ORDER — HYDROMORPHONE HCL 1 MG/ML IJ SOLN: 0.2500 mg | INTRAMUSCULAR | Status: DC | PRN

## 2023-12-11 MED ORDER — IRBESARTAN 75 MG PO TABS
75.0000 mg | ORAL_TABLET | Freq: Every day | ORAL | Status: DC
  Administered 2023-12-12: 75 mg via ORAL
  Filled 2023-12-11: qty 1

## 2023-12-11 MED ORDER — LACTATED RINGERS IV BOLUS
500.0000 mL | Freq: Once | INTRAVENOUS | Status: AC
  Administered 2023-12-11: 500 mL via INTRAVENOUS

## 2023-12-11 MED ORDER — ADULT MULTIVITAMIN W/MINERALS CH
1.0000 | ORAL_TABLET | Freq: Every day | ORAL | Status: DC
  Administered 2023-12-11 – 2023-12-12 (×2): 1 via ORAL
  Filled 2023-12-11 (×2): qty 1

## 2023-12-11 NOTE — Progress Notes (Signed)
 PROGRESS NOTE  Victoria Long FMW:[id number] DOB: Jun 13, 1928   PCP: Ozell Heron HERO, MD  Patient is from: Home.  Uses walker for ambulation.  DOA: 12/08/2023 LOS: 0  Chief complaints Chief Complaint  Patient presents with   Back Pain     Brief Narrative / Interim history: 89 year old F with PMH of bilateral knee osteoarthritis, stage Ia bilateral breast cancer s/p lumpectomy and radiations, pancreatic mass, prediabetes, vitamin D  deficiency and hyperlipidemia presented to ED for the second time in 4 days due to acute back pain, and found to have subacute T6 and acute L1 compression fractures with 75% and 55% weight loss respectively without significant retropulsion as noted on MRI.  Neurosurgery consulted and recommended TLSO for comfort and possible kyphoplasty by IR.  IR consulted.  Patient underwent kyphoplasty for acute L1 compression fracture on 11/19.  Therapy recommended SNF.  Medically stable for discharge.   Subjective: Seen and examined earlier this morning.  No major events overnight or this morning.  Report pain in both knees.  She denies back pain.  She is awake and oriented to self, place, month and year but not day of the week.  Working with therapy.   Assessment and plan: Subacute T6 compression fracture with 75% height loss without significant retropulsion or stenosis Acute L1 compression fracture with 55% height loss without significant retropulsion -No focal neurodeficits, bowel or bladder habit change.  Vitamin D  62.63. -Per EDP, NSGY recommended TLSO brace for comfort (can come off when in bed) and possible kyphoplasty. -Underwent kyphoplasty for acute L1 compression fracture on 11/19. -Multimodal pain control with scheduled Tylenol , as needed oxycodone  and Dilaudid (decreased dose). -Bowel regimen -PT/OT commended SNF.  Bilateral knee osteoarthritis -Pain medications as above -Continue topical Voltaren  gel   Essential hypertension: Soft BP  today. -Holding home Avapro.  Also added holding parameters. -LR bolus 500 cc x 1   Normocytic anemia: Stable. - Continue monitoring  Advance care planning: Discussed CODE STATUS with patient's son.  He says she has DNR at home.  He will bring the DNR paper. - Changed CODE STATUS to DNR  Severe malnutrition Body mass index is 18.29 kg/m. Nutrition Problem: Severe Malnutrition Etiology: acute illness (compression fx of vertebra) Signs/Symptoms: severe fat depletion, severe muscle depletion, percent weight loss Interventions: MVI, Snacks, Liberalize Diet   DVT prophylaxis:  enoxaparin (LOVENOX) injection 30 mg Start: 12/11/23 1000  Code Status: DNR Family Communication: Updated patient's son in person. Level of care: Med-Surg Status is: Observation The patient will require care spanning > 2 midnights and should be moved to inpatient because: Acute back pain due to thoracic and lumbar compression fracture   Final disposition: SNF.   55 minutes with more than 50% spent in reviewing records, counseling patient/family and coordinating care.  Consultants:  Neurosurgery Interventional radiology  Procedures: None  Microbiology summarized: None  Objective: Vitals:   12/10/23 2055 12/11/23 0109 12/11/23 0511 12/11/23 0911  BP: (!) 125/58 117/61 (!) 124/55 (!) 103/53  Pulse: 73 70 67 93  Resp: 18 18 18 16   Temp: 97.6 F (36.4 C) 97.6 F (36.4 C) 97.7 F (36.5 C) (!) 97.2 F (36.2 C)  TempSrc: Oral Oral Oral Oral  SpO2: 97% 95% 96% 95%  Weight:      Height:        Examination:  GENERAL: No apparent distress.  Appears frail. HEENT: MMM.  Vision and hearing grossly intact.  NECK: Supple.  No apparent JVD.  RESP:  No IWOB.  Fair  aeration bilaterally. CVS:  RRR. Heart sounds normal.  ABD/GI/GU: Soft brace in place. MSK/EXT:  Moves all extremities. No apparent deformity. No edema.  SKIN: no apparent skin lesion or wound NEURO: AA.  Oriented x 4 except date.  No  apparent focal neuro deficit. PSYCH: Calm. Normal affect.   Sch Meds:  Scheduled Meds:  acetaminophen   975 mg Oral Q6H WA   calcium  carbonate  1 tablet Oral Q breakfast   cholecalciferol  2,000 Units Oral Daily   diclofenac  Sodium  2 g Topical QID   enoxaparin (LOVENOX) injection  30 mg Subcutaneous Q24H   hydrocortisone   1 Application Rectal BID   irbesartan  150 mg Oral Daily   lidocaine   1 patch Transdermal Q24H   multivitamin with minerals  1 tablet Oral Daily   polyethylene glycol  8.5 g Oral Daily   senna  1 tablet Oral BID   Continuous Infusions: PRN Meds:.famotidine , hydrALAZINE, HYDROmorphone (DILAUDID) injection, ondansetron  **OR** ondansetron  (ZOFRAN ) IV, oxyCODONE , polyethylene glycol  Antimicrobials: Anti-infectives (From admission, onward)    Start     Dose/Rate Route Frequency Ordered Stop   12/10/23 1500  ceFAZolin  (ANCEF ) IVPB 2g/100 mL premix        2 g 200 mL/hr over 30 Minutes Intravenous To Radiology 12/10/23 1107 12/10/23 1622        I have personally reviewed the following labs and images: CBC: Recent Labs  Lab 12/04/23 1636 12/08/23 1015 12/09/23 0519 12/10/23 0501  WBC 7.3 6.7 8.4 6.4  NEUTROABS  --  4.7  --   --   HGB 11.0* 10.9* 12.1 11.5*  HCT 33.7* 33.2* 37.1 34.8*  MCV 96.6 98.8 95.9 96.4  PLT 249 262 313 310   BMP &GFR Recent Labs  Lab 12/04/23 1636 12/08/23 1015 12/09/23 0519 12/10/23 0501  NA 142 142 142 141  K 4.0 4.0 3.8 4.0  CL 106 108 106 106  CO2 27 27 25 26   GLUCOSE 107* 93 99 99  BUN 23 20 19 19   CREATININE 0.72 0.71 0.57 0.60  CALCIUM  9.6 9.5 9.8 9.7  MG  --   --   --  2.0  PHOS  --   --   --  3.3   Estimated Creatinine Clearance: 31.5 mL/min (by C-G formula based on SCr of 0.6 mg/dL). Liver & Pancreas: Recent Labs  Lab 12/04/23 1636 12/09/23 0519 12/10/23 0501  AST 17 21  --   ALT 13 14  --   ALKPHOS 66 77  --   BILITOT 0.3 0.5  --   PROT 6.2* 6.5  --   ALBUMIN 3.7 3.6 3.4*   No results for  input(s): LIPASE, AMYLASE in the last 168 hours. No results for input(s): AMMONIA in the last 168 hours. Diabetic: No results for input(s): HGBA1C in the last 72 hours. No results for input(s): GLUCAP in the last 168 hours. Cardiac Enzymes: No results for input(s): CKTOTAL, CKMB, CKMBINDEX, TROPONINI in the last 168 hours. No results for input(s): PROBNP in the last 8760 hours. Coagulation Profile: Recent Labs  Lab 12/09/23 1107  INR 1.1   Thyroid  Function Tests: No results for input(s): TSH, T4TOTAL, FREET4, T3FREE, THYROIDAB in the last 72 hours. Lipid Profile: No results for input(s): CHOL, HDL, LDLCALC, TRIG, CHOLHDL, LDLDIRECT in the last 72 hours. Anemia Panel: No results for input(s): VITAMINB12, FOLATE, FERRITIN, TIBC, IRON, RETICCTPCT in the last 72 hours. Urine analysis:    Component Value Date/Time   COLORURINE YELLOW 12/04/2023 1728  APPEARANCEUR CLEAR 12/04/2023 1728   LABSPEC 1.023 12/04/2023 1728   PHURINE 6.5 12/04/2023 1728   GLUCOSEU NEGATIVE 12/04/2023 1728   HGBUR NEGATIVE 12/04/2023 1728   BILIRUBINUR NEGATIVE 12/04/2023 1728   BILIRUBINUR negative 10/23/2023 1440   KETONESUR TRACE (A) 12/04/2023 1728   PROTEINUR TRACE (A) 12/04/2023 1728   UROBILINOGEN negative (A) 10/23/2023 1440   UROBILINOGEN 0.2 08/09/2014 1505   NITRITE NEGATIVE 12/04/2023 1728   LEUKOCYTESUR NEGATIVE 12/04/2023 1728   Sepsis Labs: Invalid input(s): PROCALCITONIN, LACTICIDVEN  Microbiology: No results found for this or any previous visit (from the past 240 hours).  Radiology Studies: IR KYPHO LUMBAR INC FX REDUCE BONE BX UNI/BIL CANNULATION INC/IMAGING Result Date: 12/10/2023 CLINICAL DATA:  Painful lumbar L1 vertebral compression fracture. No retropulsion. EXAM: KYPHOPLASTY AT LUMBAR L1 TECHNIQUE: The procedure, risks (including but not limited to bleeding, infection, organ damage), benefits, and alternatives were  explained to the patient and family. Questions regarding the procedure were encouraged and answered. The family understands and consents to the procedure. The patient was placed prone on the fluoroscopic table. The skin overlying the upper lumbar region was then prepped and draped in the usual sterile fashion. Maximal barrier sterile technique was utilized including caps, mask, sterile gowns, sterile gloves, sterile drape, hand hygiene and skin antiseptic. Intravenous Fentanyl  75mcg and Versed  0.5mg  were administered by RN during a total moderate (conscious) sedation time of 27 minutes; the patient's level of consciousness and physiological / cardiorespiratory status were monitored continuously by radiology RN under my direct supervision. As antibiotic prophylaxis, cefazolin  2 g was ordered pre-procedure and administered intravenously within !one hour! of incision. The right pedicle at lumbar L1 was then infiltrated with 1% lidocaine  followed by the advancement of a Kyphon trocar needle through the right pedicle into the posterior one-third. The trocar was removed and the osteo drill was advanced to the anterior third of the vertebral body. The osteo drill was retracted. Through the working cannula, a Kyphon inflatable bone tamp was advanced and positioned with the distal marker 5 mm from the anterior aspect of the cortex. Crossing of the midline was not seen on the AP projection. At this time, the balloon was expanded using contrast via a Kyphon inflation syringe device via micro tubing. In similar fashion, the left pedicle was infiltrated with 1% lidocaine  followed by the advancement of a second Kyphon trocar needle through the left pedicle into the posterior third of the vertebral body. The osteo drill was coaxially advanced to the anterior third. The osteo drill was exchanged for a Kyphon inflatable bone tamp , advanced to within 5 mm of the anterior aspect of the cortex. The balloon was then expanded using  contrast as above. Inflations were continued until there was near apposition across the midline and with the superior and the inferior end plates. At this time, methylmethacrylate mixture was reconstituted in the Kyphon bone mixing device system. This was then loaded into the delivery mechanism, attached to Kyphon bone fillers. The balloons were deflated and removed followed by the instillation of methylmethacrylate mixture with excellent filling in the AP and lateral projections. No extravasation was noted in the disk spaces or posteriorly into the spinal canal. No epidural venous contamination was seen. The patient tolerated the procedure well. There were no acute complications. The working cannulae and the bone filler were then retrieved and removed. COMPLICATIONS: COMPLICATIONS None immediate. IMPRESSION: 1. Status post vertebral body augmentation using balloon kyphoplasty at lumbar L1 as described without event. 2. Per CMS reporting  requirements (MIPS measure 24): Given the patient's age of greater than 50 and the fracture site (hip, distal radius, or spine), the patient should be tested for osteoporosis using DXA, and the appropriate treatment considered based on the DXA results. Electronically Signed   By: JONETTA Faes M.D.   On: 12/10/2023 17:08       Srihaan Mastrangelo T. Demon Volante [facility]  If 7PM-7AM, please contact night-coverage [email or web address] 12/11/2023, 10:57 AM

## 2023-12-11 NOTE — [place] FL2 (Signed)
 [employer]  MEDICAID FL2 LEVEL OF CARE FORM     IDENTIFICATION  Patient Name: Victoria Long Birthdate: 20-Jul-1928 Sex: female Admission Date (Current Location): 12/08/2023  [facility] and [place] Number:  Producer, Television/film/video and Address:  [facility], [address] [place], [place] [address]      Provider Number: [id number]  Attending Physician Name and Address:  Kathrin Mignon DASEN, MD  Relative Name and Phone Number:       Current Level of Care: Hospital Recommended Level of Care: Skilled Nursing Facility Prior Approval Number:    Date Approved/Denied:   PASRR Number: [id number]  Discharge Plan: SNF    Current Diagnoses: Patient Active Problem List   Diagnosis Date Noted   Protein-calorie malnutrition, severe 12/10/2023   Closed compression fracture of thoracolumbar vertebra (HCC) 12/08/2023   Normocytic anemia 12/08/2023   External hemorrhoids 06/14/2020   Sigmoid diverticulosis 05/02/2020   Senile purpura 01/11/2019   Unilateral primary osteoarthritis, right knee 08/13/2018   Atherosclerosis of aorta 05/28/2018   Malignant neoplasm of upper-outer quadrant of left breast in female, estrogen receptor positive (HCC) 07/23/2017   Melanoma in situ of upper arm, right (HCC) 07/23/2017   At high risk for falls 07/23/2017   Invasive ductal carcinoma of breast, female, left (HCC) 07/15/2017   Essential hypertension 10/01/2016   Pancreatic mass 08/22/2016   History of right breast cancer 10/25/2013   Encounter for long-term (current) use of medications 04/19/2013   Hyperlipidemia    Abnormal glucose    Vitamin D  deficiency     Orientation RESPIRATION BLADDER Height & Weight     Self  Normal Incontinent Weight: 100 lb (45.4 kg) Height:  5' 2 (157.5 cm)  BEHAVIORAL SYMPTOMS/MOOD NEUROLOGICAL BOWEL NUTRITION STATUS      Continent Diet (see d/c summary)  AMBULATORY STATUS COMMUNICATION OF NEEDS Skin   Extensive Assist Verbally Normal                        Personal Care Assistance Level of Assistance  Bathing, Feeding, Dressing Bathing Assistance: Maximum assistance Feeding assistance: Independent Dressing Assistance: Maximum assistance     Functional Limitations Info  Sight, Hearing, Speech Sight Info: Impaired Hearing Info: Adequate      SPECIAL CARE FACTORS FREQUENCY  OT (By licensed OT), PT (By licensed PT)     PT Frequency: 5x/week OT Frequency: 5x/week            Contractures Contractures Info: Not present    Additional Factors Info  Code Status, Allergies Code Status Info: full code Allergies Info: Anastrozole , Tramadol            Current Medications (12/11/2023):  This is the current hospital active medication list Current Facility-Administered Medications  Medication Dose Route Frequency Provider Last Rate Last Admin   acetaminophen  (TYLENOL ) tablet 975 mg  975 mg Oral Q6H WA Gonfa, Taye T, MD   975 mg at 12/11/23 9072   calcium  carbonate (OS-CAL - dosed in mg of elemental calcium ) tablet 1,250 mg  1 tablet Oral Q breakfast Celinda Alm Lot, MD   1,250 mg at 12/11/23 0931   cholecalciferol  (VITAMIN D3) 25 MCG (1000 UNIT) tablet 2,000 Units  2,000 Units Oral Daily Celinda Alm Lot, MD   2,000 Units at 12/11/23 0930   diclofenac  Sodium (VOLTAREN ) 1 % topical gel 2 g  2 g Topical QID Gonfa, Taye T, MD   2 g at 12/11/23 0931   enoxaparin  (LOVENOX )  injection 30 mg  30 mg Subcutaneous Q24H Pham, Anh P, RPH   30 mg at 12/11/23 0932   famotidine  (PEPCID ) tablet 20 mg  20 mg Oral QHS PRN Celinda Alm Lot, MD       hydrALAZINE (APRESOLINE) tablet 25 mg  25 mg Oral Q6H PRN Gonfa, Taye T, MD   25 mg at 12/09/23 1817   hydrocortisone  (ANUSOL -HC) 2.5 % rectal cream 1 Application  1 Application Rectal BID Celinda Alm Lot, MD   1 Application at 12/10/23 1005   HYDROmorphone (DILAUDID) injection 0.25 mg  0.25 mg Intravenous Q3H PRN Gonfa, Taye T, MD       irbesartan (AVAPRO) tablet 150 mg  150  mg Oral Daily Celinda Alm Lot, MD   150 mg at 12/10/23 0947   lidocaine  (LIDODERM ) 5 % 1 patch  1 patch Transdermal Q24H Celinda Alm Lot, MD   1 patch at 12/09/23 1725   ondansetron  (ZOFRAN ) tablet 4 mg  4 mg Oral Q6H PRN Celinda Alm Lot, MD       Or   ondansetron  (ZOFRAN ) injection 4 mg  4 mg Intravenous Q6H PRN Celinda Alm Lot, MD       oxyCODONE  (Oxy IR/ROXICODONE ) immediate release tablet 2.5 mg  2.5 mg Oral Q4H PRN Celinda Alm Lot, MD   2.5 mg at 12/09/23 0053   polyethylene glycol (MIRALAX / GLYCOLAX) packet 17 g  17 g Oral Daily PRN Celinda Alm Lot, MD       polyethylene glycol (MIRALAX / GLYCOLAX) packet 8.5 g  8.5 g Oral Daily Celinda Alm Lot, MD   8.5 g at 12/11/23 9068   senna (SENOKOT) tablet 8.6 mg  1 tablet Oral BID Celinda Alm Lot, MD   8.6 mg at 12/11/23 0930     Discharge Medications: Please see discharge summary for a list of discharge medications.  Relevant Imaging Results:  Relevant Lab Results:   Additional Information SSN [id number] [place], [place]

## 2023-12-11 NOTE — TOC Progression Note (Addendum)
 Transition of Care [facility]) - Progression Note    Patient Details  Name: Victoria Long MRN: [id number] Date of Birth: [birth year]  Transition [facility]) CM/SW Contact  Luann SHAUNNA [place], [place] Phone Number: 12/11/2023, 9:47 AM  Clinical Narrative:     CSW met with pt's son Debby outside of room. Discussed SNF rec. Pt currently living at home with family providing support for the last few weeks. Pt has an apartment at [employer] [facility] though has not moved in yet. Family would like for pt to go to SNF for short term rehab and then move into her [facility] apartment. CSW explained SNF w/u process, medicare coverage, and pre authorization. Son agreeable to SNF referrals. FL2 completed and bed requests sent in hub. Inpatient Care Management to follow up with SNF offers.   1400: Left list of SNF bed offers and medicare star ratings in pt's room. Pt's niece was in there and stated that Debby should be returning later today. CSW called Debby and notified him that list is in room. He confirmed he will review list when he returns later today.   1620: Pt's son notified CSW that they would like to go with [facility] SNF. ICM to start auth. CSW notified [facility];awaiting response.   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, Continued Medical Work up, SNF Pending bed offer               Expected Discharge Plan and Services       Living arrangements for the past 2 months: Single Family Home                                       Social Drivers of Health (SDOH) Interventions SDOH Screenings   Food Insecurity: No Food Insecurity (12/08/2023)  Housing: Low Risk  (12/08/2023)  Transportation Needs: No Transportation Needs (12/08/2023)  Utilities: Not At Risk (12/08/2023)  Alcohol Screen: Low Risk  (11/25/2023)  Depression (PHQ2-9): Low Risk  (11/25/2023)  Financial Resource Strain: Low Risk  (11/25/2023)  Physical Activity:  Inactive (11/25/2023)  Social Connections: Socially Isolated (12/08/2023)  Stress: No Stress Concern Present (11/25/2023)  Tobacco Use: Medium Risk (12/08/2023)  Health Literacy: Adequate Health Literacy (11/25/2023)    Readmission Risk Interventions     No data to display

## 2023-12-11 NOTE — Progress Notes (Addendum)
 Patient ID: Victoria Long, female   DOB: [birth year], 89 y.o.   MRN: [id number]    Referring Physician(s): Ortiz,D  Supervising Physician: Philip Cornet  Patient Status:  [facility] - In-pt  Chief Complaint: Mid to low back pain, L1 compression fracture, status post L1 kyphoplasty on 12/10/2023   Subjective: Patient is reporting less low back pain since kyphoplasty yesterday.  Able to get up and sit in chair with assistance; has taken less pain meds today   Allergies: Anastrozole  and Tramadol   Medications: Prior to Admission medications   Medication Sig Start Date End Date Taking? Authorizing Provider  ADVIL 200 MG CAPS Take 400 mg by mouth See admin instructions. Take 400 mg by mouth every 4-5 hours ROTATING WITH 1,000 mg (of) Tylenol    Yes [provider]  ARTIFICIAL TEARS 1 % ophthalmic solution Place 1 drop into both eyes 3 (three) times daily as needed (for dryness).   Yes [provider]  calcium  carbonate (OS-CAL) 600 MG TABS Take 600 mg by mouth daily with breakfast.   Yes [provider]  Cholecalciferol (VITAMIN D3) 50 MCG (2000 UT) TABS Take 2,000 Units by mouth daily.   Yes [provider]  Cyanocobalamin  (VITAMIN B 12 PO) Take 1 tablet by mouth daily.   Yes [provider]  DEBROX 6.5 % OTIC solution Place 5 drops into both ears 2 (two) times daily as needed (as directed to clear the ear canals).   Yes [provider]  diclofenac  Sodium (VOLTAREN ) 1 % GEL Apply 4 g topically 4 (four) times daily as needed. Patient taking differently: Apply 4 g topically 4 (four) times daily as needed (FOR PAIN). 09/26/23  Yes Ozell Heron HERO, MD  famotidine  (PEPCID ) 20 MG tablet TAKE 1 TABLET BY MOUTH EVERY NIGHT AT BEDTIME Patient taking differently: Take 20 mg by mouth at bedtime as needed for heartburn or indigestion. 11/25/23  Yes Ozell Heron HERO, MD  hydrocortisone  (ANUSOL -HC) 2.5 % rectal cream Place 1 Application rectally 2  (two) times daily. 06/18/23  Yes Ozell Heron HERO, MD  lidocaine  (LIDODERM ) 5 % Place 1 patch onto the skin daily. Remove & Discard patch within 12 hours or as directed by MD Patient taking differently: Place 1 patch onto the skin daily. Apply 1 new patch to the lower back and remove & discard patch within 12 hours or as directed by MD 12/04/23  Yes Young, Caron PARAS, DO  LYSINE HCL PO Take 500 mg by mouth daily.    Yes [provider]  MIRALAX 17 GM/SCOOP powder Take 8.5 g by mouth See admin instructions. Dissolve 8.5 grams into 4-8 ounces of coffee and drink by mouth in the morning   Yes [provider]  Multiple Vitamins-Minerals (MULTIVITAMIN PO) Take 1 tablet by mouth daily with breakfast.   Yes [provider]  olmesartan  (BENICAR ) 20 MG tablet Take 1 tablet (20 mg total) by mouth daily. 11/25/23  Yes Ozell Heron HERO, MD  TYLENOL  500 MG tablet Take 1,000 mg by mouth See admin instructions. Take 1,000 mg by mouth every 4-5 hours ROTATING WITH 400 mg (of) Advil   Yes [provider]     Vital Signs: BP (!) 119/54 (BP Location: Right Arm) Comment: notify to the nurse.  Pulse 74   Temp 97.6 F (36.4 C) (Oral)   Resp 18   Ht 5' 2 (1.575 m)   Wt 100 lb (45.4 kg)   SpO2 100%   BMI 18.29 kg/m  Physical Exam patient awake, answering simple questions okay. Family in room.  Puncture sites L1 region clean, dry, intact bandages, only minimal tenderness to palpation.  Moving all fours okay.  Imaging: IR KYPHO LUMBAR INC FX REDUCE BONE BX UNI/BIL CANNULATION INC/IMAGING Result Date: 12/10/2023 CLINICAL DATA:  Painful lumbar L1 vertebral compression fracture. No retropulsion. EXAM: KYPHOPLASTY AT LUMBAR L1 TECHNIQUE: The procedure, risks (including but not limited to bleeding, infection, organ damage), benefits, and alternatives were explained to the patient and family. Questions regarding the procedure were encouraged and answered. The family understands and  consents to the procedure. The patient was placed prone on the fluoroscopic table. The skin overlying the upper lumbar region was then prepped and draped in the usual sterile fashion. Maximal barrier sterile technique was utilized including caps, mask, sterile gowns, sterile gloves, sterile drape, hand hygiene and skin antiseptic. Intravenous Fentanyl  75mcg and Versed  0.5mg  were administered by RN during a total moderate (conscious) sedation time of 27 minutes; the patient's level of consciousness and physiological / cardiorespiratory status were monitored continuously by radiology RN under my direct supervision. As antibiotic prophylaxis, cefazolin  2 g was ordered pre-procedure and administered intravenously within !one hour! of incision. The right pedicle at lumbar L1 was then infiltrated with 1% lidocaine  followed by the advancement of a Kyphon trocar needle through the right pedicle into the posterior one-third. The trocar was removed and the osteo drill was advanced to the anterior third of the vertebral body. The osteo drill was retracted. Through the working cannula, a Kyphon inflatable bone tamp was advanced and positioned with the distal marker 5 mm from the anterior aspect of the cortex. Crossing of the midline was not seen on the AP projection. At this time, the balloon was expanded using contrast via a Kyphon inflation syringe device via micro tubing. In similar fashion, the left pedicle was infiltrated with 1% lidocaine  followed by the advancement of a second Kyphon trocar needle through the left pedicle into the posterior third of the vertebral body. The osteo drill was coaxially advanced to the anterior third. The osteo drill was exchanged for a Kyphon inflatable bone tamp , advanced to within 5 mm of the anterior aspect of the cortex. The balloon was then expanded using contrast as above. Inflations were continued until there was near apposition across the midline and with the superior and the  inferior end plates. At this time, methylmethacrylate mixture was reconstituted in the Kyphon bone mixing device system. This was then loaded into the delivery mechanism, attached to Kyphon bone fillers. The balloons were deflated and removed followed by the instillation of methylmethacrylate mixture with excellent filling in the AP and lateral projections. No extravasation was noted in the disk spaces or posteriorly into the spinal canal. No epidural venous contamination was seen. The patient tolerated the procedure well. There were no acute complications. The working cannulae and the bone filler were then retrieved and removed. COMPLICATIONS: COMPLICATIONS None immediate. IMPRESSION: 1. Status post vertebral body augmentation using balloon kyphoplasty at lumbar L1 as described without event. 2. Per CMS reporting requirements (MIPS measure 24): Given the patient's age of greater than 50 and the fracture site (hip, distal radius, or spine), the patient should be tested for osteoporosis using DXA, and the appropriate treatment considered based on the DXA results. Electronically Signed   By: JONETTA Faes M.D.   On: 12/10/2023 17:08   MR Lumbar Spine W Wo Contrast Result Date: 12/08/2023 EXAM: MRI LUMBAR SPINE 12/08/2023 01:53:00 PM TECHNIQUE: Multiplanar  multisequence MRI of the lumbar spine was performed with and without the administration of intravenous contrast. COMPARISON: Lumbar spine CT 12/04/2023. CLINICAL HISTORY: Compression fracture, lumbar. Worsening low back pain. FINDINGS: BONES AND ALIGNMENT: 5 lumbar-type vertebrae. Slight upper lumbar levoscoliosis. Unchanged grade 1 anterolisthesis of L4 and L5. L1 compression fracture with mildly progressive vertebral body height loss, now measuring 55%. Marrow edema throughout the L1 vertebral body. No significant retropulsion. No mass. SPINAL CORD: The conus medullaris terminates at L2 and is normal in signal. SOFT TISSUES: Mild paraspinal soft tissue edema in  the upper lumbar spine. DISC LEVELS: Disc desiccation throughout the lumbar spine. Moderate disc space narrowing at L4-L5 and L5-S1. T12-L1: Minimal disc bulging without stenosis. L1-L2: Mild disc bulging and mild facet and ligamentum flavum hypertrophy without stenosis. L2-L3: Disc bulging and moderate facet and ligamentum flavum hypertrophy without stenosis. L3-L4: Disc bulging and severe facet and ligamentum flavum hypertrophy result in mild right neural foraminal stenosis without spinal stenosis. L4-L5: Anterolisthesis with diffuse bulging of uncovered disc and severe facet and ligamentum flavum hypertrophy result in moderate spinal stenosis, moderate bilateral lateral recess stenosis, and mild right and moderate left neural foraminal stenosis. L5-S1: Disc bulging and moderate facet and ligamentum flavum hypertrophy result in mild bilateral lateral recess stenosis. IMPRESSION: 1. Acute/subacute L1 compression fracture with mildly progressive height loss, now 55%. No retropulsion. 2. Moderate spinal stenosis at L4-L5. Electronically signed by: Dasie Hamburg MD 12/08/2023 02:44 PM EST RP Workstation: [id number]   MR THORACIC SPINE W WO CONTRAST Result Date: 12/08/2023 EXAM: MRI THORACIC SPINE WITHOUT AND WITH INTRAVENOUS CONTRAST 12/08/2023 01:53:00 PM TECHNIQUE: Multiplanar multisequence MRI of the thoracic spine was performed without and with the administration of intravenous contrast. COMPARISON: Thoracic spine CT 12/04/2023 and MRI 10/09/2023. CLINICAL HISTORY: Compression fracture, thoracic. Worsening back pain. FINDINGS: BONES AND ALIGNMENT: Exaggerated thoracic kyphosis. No significant listhesis. T6 compression fracture with unchanged 75% vertebral body height loss and persistent moderate marrow edema without retropulsion. No new thoracic compression fracture. Unchanged small sclerotic foci in the T8, T9, and T11 vertebral bodies and right T9 transverse process as previously noted, possibly bone islands.  No suspicious enhancing lesion. Atypical hemangioma in the C6 vertebral body. SPINAL CORD: Normal spinal cord volume. Normal spinal cord signal. SOFT TISSUES: Small bilateral pleural effusions, left larger than right. DEGENERATIVE CHANGES: Incompletely evaluated degenerative changes in the cervical spine. No more than mild disc degeneration and facet arthrosis in the thoracic spine without high grade spinal canal or neural foraminal stenosis. Disc bulging at T6-T7 contacts the ventral spinal cord. IMPRESSION: 1. Subacute T6 compression fracture with unchanged 75% height loss and persistent moderate marrow edema without retropulsion. 2. No new thoracic spine fracture or compressive stenosis. 3. Small bilateral pleural effusions. Electronically signed by: Dasie Hamburg MD 12/08/2023 02:33 PM EST RP Workstation: [id number]    Labs:  CBC: Recent Labs    12/04/23 1636 12/08/23 1015 12/09/23 0519 12/10/23 0501  WBC 7.3 6.7 8.4 6.4  HGB 11.0* 10.9* 12.1 11.5*  HCT 33.7* 33.2* 37.1 34.8*  PLT 249 262 313 310    COAGS: Recent Labs    12/09/23 1107  INR 1.1    BMP: Recent Labs    12/04/23 1636 12/08/23 1015 12/09/23 0519 12/10/23 0501  NA 142 142 142 141  K 4.0 4.0 3.8 4.0  CL 106 108 106 106  CO2 27 27 25 26   GLUCOSE 107* 93 99 99  BUN 23 20 19 19   CALCIUM  9.6 9.5 9.8 9.7  CREATININE  0.72 0.71 0.57 0.60  GFRNONAA >60 >60 >60 >60    LIVER FUNCTION TESTS: Recent Labs    04/15/23 1354 11/25/23 1034 12/04/23 1636 12/09/23 0519 12/10/23 0501  BILITOT 0.9 0.6 0.3 0.5  --   AST 23 18 17 21   --   ALT 16 14 13 14   --   ALKPHOS 40 49 66 77  --   PROT 6.3* 6.5 6.2* 6.5  --   ALBUMIN 3.8 3.9 3.7 3.6 3.4*    Assessment and Plan: Patient with history of symptomatic L1 compression fracture, status post L1 kyphoplasty on 12/10/2023; stable, afebrile, reporting less low back pain since procedure.  Continue with PT/OT.  Additional plans as per primary team.   Electronically  Signed: D. Franky Rakers, PA-C 12/11/2023, 3:07 PM   I spent a total of 15 Minutes at the the patient's bedside AND on the patient's hospital floor or unit, greater than 50% of which was counseling/coordinating care for lumbar 1 kyphoplasty

## 2023-12-11 NOTE — TOC Progression Note (Signed)
 Transition of Care [facility]) - Progression Note    Patient Details  Name: Victoria Long MRN: [id number] Date of Birth: 29-Apr-1928  [facility]) CM/SW Contact  Alfonse JONELLE Rex, RN Phone Number: 12/11/2023, 4:28 PM  Clinical Narrative:   SNF auth initiated via [facility] and approved.  Auth ID [id number],   days approved 12/11/2023-12/15/2023     Expected Discharge Plan: Skilled Nursing Facility Barriers to Discharge: Insurance Authorization, Continued Medical Work up, SNF Pending bed offer               Expected Discharge Plan and Services       Living arrangements for the past 2 months: Single Family Home                                       Social Drivers of Health (SDOH) Interventions SDOH Screenings   Food Insecurity: No Food Insecurity (12/08/2023)  Housing: Low Risk  (12/08/2023)  Transportation Needs: No Transportation Needs (12/08/2023)  Utilities: Not At Risk (12/08/2023)  Alcohol Screen: Low Risk  (11/25/2023)  Depression (PHQ2-9): Low Risk  (11/25/2023)  Financial Resource Strain: Low Risk  (11/25/2023)  Physical Activity: Inactive (11/25/2023)  Social Connections: Socially Isolated (12/08/2023)  Stress: No Stress Concern Present (11/25/2023)  Tobacco Use: Medium Risk (12/08/2023)  Health Literacy: Adequate Health Literacy (11/25/2023)    Readmission Risk Interventions     No data to display

## 2023-12-11 NOTE — Evaluation (Signed)
 Physical Therapy Evaluation Patient Details Name: Victoria Long Common MRN: [id number] DOB: 1929-01-19 Today's Date: 12/11/2023  History of Present Illness  89 year old Fpresented to ED 12/09/23  for the second time in 4 days due to acute back pain, and found to have T6 and L1 compression fractures S/P L1 kyphoplasty 12/10/23. On Previous Ed visit,I.  Neurosurgery consulted and recommended TLSO for comfort and possible kyphoplasty by IR.  PMH of bilateral knee osteoarthritis, stage Ia bilateral breast cancer s/p lumpectomy and radiations, pancreatic mass, prediabetes, vitamin D  deficiency and hyperlipidemia  Clinical Impression  Pt admitted with above diagnosis.  Pt currently with functional limitations due to the deficits listed below (see PT Problem List). Pt will benefit from acute skilled PT to increase their independence and safety with mobility to allow discharge.     The patient is awake and alert, able to follow instructions with increased time.  Patient's son is present and provides information related to prior function. Son reports family has been assisting patient in her town home nearly 24/7 since diagnoses of vertebral fractures, with progressively more difficult to provide care due to patient's  worsening  back pain.  Patient has been using a rollator recently, was independently living until ~ 2 weeks ago. Patient has an apartment  ready to move into at ILF/[place] ready. Son agreeable to short rehab at DC prior to ILF move.  Patient will benefit from continued inpatient follow up therapy, <3 hours/day       If plan is discharge home, recommend the following: A little help with walking and/or transfers;A little help with bathing/dressing/bathroom;Assist for transportation;Help with stairs or ramp for entrance   Can travel by private vehicle   Yes    Equipment Recommendations None recommended by PT  Recommendations for Other Services       Functional Status Assessment  Patient has had a recent decline in their functional status and demonstrates the ability to make significant improvements in function in a reasonable and predictable amount of time.     Precautions / Restrictions Precautions Precautions: Fall Precaution/Restrictions Comments: urinary incontinence Required Braces or Orthoses: Spinal Brace Spinal Brace: Thoracolumbosacral orthotic;Other (comment) Spinal Brace Comments: TLSO brace for comfort (can come off when in bed) prior to KP did not use since post KP( ? In room)     Mobility  Bed Mobility Overal bed mobility: Needs Assistance Bed Mobility: Rolling, Sidelying to Sit Rolling: Mod assist Sidelying to sit: Max assist       General bed mobility comments: multimodal cues to flex legs,  step by step instruction and cues  to roll and push to sitting,  use of bed pad to assist to sit fully upright.    Transfers Overall transfer level: Needs assistance Equipment used: Rolling walker (2 wheels) Transfers: Sit to/from Stand, Bed to chair/wheelchair/BSC Sit to Stand: Mod assist   Step pivot transfers: Mod assist       General transfer comment: steady assist to stand, noted feet moving when stands, shuffle steps to  recliner, able to turn around and  step back, cues to reach back to chair arms    Ambulation/Gait                  Stairs            Wheelchair Mobility     Tilt Bed    Modified Rankin (Stroke Patients Only)       Balance Overall balance assessment: Needs assistance Sitting-balance support: No upper extremity supported,  Feet supported Sitting balance-Leahy Scale: Fair     Standing balance support: Reliant on assistive device for balance, During functional activity, Bilateral upper extremity supported                                 Pertinent Vitals/Pain Pain Assessment Breathing: normal Negative Vocalization: none Facial Expression: smiling or inexpressive Body Language:  relaxed Consolability: no need to console PAINAD Score: 0    Home Living Family/patient expects to be discharged to:: Skilled nursing facility Living Arrangements: Alone;Children Available Help at Discharge: Available 24 hours/day;Family Type of Home: House         Home Layout: One level Home Equipment: Rollator (4 wheels) Additional Comments: has apartment  at [facility]F/[place], has been staying in town home with family assisting since initial DX of L1 and t 6 compression fxs 2 weeks ago- son present to provide info    Prior Function Prior Level of Function : Needs assist             Mobility Comments: recently using rollator ADLs Comments: was able to  ambulate to BR  with rollator until few days prior to this admission     Extremity/Trunk Assessment   Upper Extremity Assessment Upper Extremity Assessment: Defer to OT evaluation    Lower Extremity Assessment Lower Extremity Assessment: Generalized weakness    Cervical / Trunk Assessment Cervical / Trunk Assessment: Kyphotic  Communication   Communication Communication: Impaired Factors Affecting Communication: Difficulty expressing self    Cognition Arousal: Alert Behavior During Therapy: WFL for tasks assessed/performed   PT - Cognitive impairments: History of cognitive impairments, Orientation, Awareness, Problem solving, Sequencing   Orientation impairments: Time, Situation                   PT - Cognition Comments: patient required multimodal cues for  sequencing to roll,  and sit upright. Patient looks to son to for answers to questions, orientation and home living arrangements Following commands: Impaired Following commands impaired: Follows one step commands inconsistently     Cueing Cueing Techniques: Verbal cues, Gestural cues, Tactile cues     General Comments      Exercises     Assessment/Plan    PT Assessment Patient needs continued PT services  PT Problem List Decreased  strength;Decreased cognition;Decreased range of motion;Decreased knowledge of use of DME;Decreased activity tolerance;Decreased safety awareness;Decreased balance;Decreased knowledge of precautions;Decreased mobility       PT Treatment Interventions DME instruction;Gait training;Functional mobility training;Therapeutic activities;Therapeutic exercise;Balance training;Patient/family education    PT Goals (Current goals can be found in the Care Plan section)  Acute Rehab PT Goals Patient Stated Goal: to go to rehab PT Goal Formulation: With patient/family Time For Goal Achievement: 12/25/23 Potential to Achieve Goals: Good    Frequency Min 2X/week     Co-evaluation               AM-PAC PT 6 Clicks Mobility  Outcome Measure Help needed turning from your back to your side while in a flat bed without using bedrails?: A Lot Help needed moving from lying on your back to sitting on the side of a flat bed without using bedrails?: A Lot Help needed moving to and from a bed to a chair (including a wheelchair)?: A Lot Help needed standing up from a chair using your arms (e.g., wheelchair or bedside chair)?: A Lot Help needed to walk in hospital room?: Total  Help needed climbing 3-5 steps with a railing? : Total 6 Click Score: 10    End of Session Equipment Utilized During Treatment: Gait belt Activity Tolerance: Patient tolerated treatment well Patient left: in chair;with call bell/phone within reach;with family/visitor present Nurse Communication: Mobility status PT Visit Diagnosis: Unsteadiness on feet (R26.81);Other abnormalities of gait and mobility (R26.89);Muscle weakness (generalized) (M62.81)    Time: 9182-9157 PT Time Calculation (min) (ACUTE ONLY): 25 min   Charges:   PT Evaluation $PT Eval Low Complexity: 1 Low PT Treatments $Therapeutic Activity: 8-22 mins PT General Charges $$ ACUTE PT VISIT: 1 Visit         Darice Potters PT Acute Rehabilitation  Services Office [phone]   Potters Darice Norris 12/11/2023, 8:58 AM

## 2023-12-11 NOTE — Evaluation (Signed)
 Occupational Therapy Evaluation Patient Details Name: Victoria Long MRN: [id number] DOB: 08-10-1928 Today's Date: 12/11/2023   History of Present Illness   Pt is a 89 y.o. female s/p kyphoplasty on 12/10/23. Pt presented to ED with acute back pain, found to have T6 + L2 compression fractures with 75% and 55% weight loss, neurosurgery recommends TSLO for comfort. PMH significant for bilateral knee osteoarthritis, stage Ia bilateral breast cancer s/p lumpectomy and radiations, pancreatic mass, prediabetes, vitamin D  deficiency and hyperlipidemia     Clinical Impressions Pt admitted with above. Prior to admission, pt lives in ILF but was staying with family in town home after diagnoses of back fractures. Pt uses rollator for mobility and was mod independent for ADL performance. Pt presents with generalized weakness, increased pain in R knee / back, confusion, decreased balance, cognition and activity tolerance. Pt requires MOD - MAX A using RW to perform STS from various surface levels, benefits from max multimodal cuing throughout 2/2 HOH  + confusion, MOD - MAX A for SPT to BSC, TOTAL A for standing pericare after multiple BM, and MAX A for UB/LB bathing seated in recliner, MAX A to doff/don gown. Recommend +2 assist with nursing for transfers to/from [facility].   Pt would benefit from skilled OT services to address noted impairments and functional limitations (see below for any additional details) in order to maximize safety and independence while minimizing falls risk and caregiver burden. Anticipate the need for follow up OT services upon acute hospital DC. Patient will benefit from continued inpatient follow up therapy, <3 hours/day      If plan is discharge home, recommend the following:   Two people to help with walking and/or transfers;A lot of help with bathing/dressing/bathroom;Assist for transportation;Help with stairs or ramp for entrance     Functional Status Assessment    Patient has had a recent decline in their functional status and demonstrates the ability to make significant improvements in function in a reasonable and predictable amount of time.     Equipment Recommendations   None recommended by OT      Precautions/Restrictions   Precautions Precautions: Back;Fall Recall of Precautions/Restrictions: Impaired Required Braces or Orthoses: Spinal Brace Spinal Brace: Thoracolumbosacral orthotic;Other (comment) Spinal Brace Comments: TLSO brace for comfort (can come off when in bed) per secure chat with MD s/p kyphoplasty Restrictions Weight Bearing Restrictions Per Provider Order: No     Mobility Bed Mobility Overal bed mobility: Needs Assistance Bed Mobility: Rolling, Sit to Sidelying Rolling: Mod assist       Sit to sidelying: Max assist General bed mobility comments: maxA for log roll technique, spinal precautions implemented    Transfers Overall transfer level: Needs assistance Equipment used: Rolling walker (2 wheels) Transfers: Sit to/from Stand, Bed to chair/wheelchair/BSC Sit to Stand: Mod assist     Step pivot transfers: Max assist     General transfer comment: multiple STS transfers, pt fatigues with activity, MAX A for step pivot to [facility]      Balance Overall balance assessment: Needs assistance Sitting-balance support: No upper extremity supported, Feet supported Sitting balance-Leahy Scale: Fair     Standing balance support: Reliant on assistive device for balance, During functional activity, Bilateral upper extremity supported Standing balance-Leahy Scale: Poor Standing balance comment: fatigues quickly, baseline knee OA, unsteady and poor carryover of hand placement / technique throughout  ADL either performed or assessed with clinical judgement   ADL Overall ADL's : Needs assistance/impaired     Grooming: Wash/dry hands;Sitting;Minimal assistance   Upper Body Bathing:  Sitting;Maximal assistance   Lower Body Bathing: Sit to/from stand;Maximal assistance   Upper Body Dressing : Sitting;Maximal assistance       Toilet Transfer: Moderate assistance;Cueing for sequencing;Cueing for safety;Ambulation;BSC/3in1;Rolling walker (2 wheels);Maximal assistance Toilet Transfer Details (indicate cue type and reason): pt able to walk to [facility] placed 4 ft in front of recliner, unsteady, requires max multimodal cues for hand placement due to confusion, weakness and decreased cognition. R knee with instability throughout, pt high falls risk. Toileting- Clothing Manipulation and Hygiene: Total assistance;Sit to/from stand;+2 for physical assistance Toileting - Clothing Manipulation Details (indicate cue type and reason): TOTAL A for standing pericare, requires +2 assist as pt cannot maintain static balance without at least MOD A for support at trunk with foot blocked for safety     Functional mobility during ADLs: Maximal assistance;Moderate assistance;Cueing for safety;Cueing for sequencing;Rolling walker (2 wheels) General ADL Comments: pt recieved sitting in recliner, noted to be incontinent of urine. verbalizes need for BM; pt able to perform STS with MOD A from recliner and walk a few steps to [facility] placed in room using RW. Pt unsteady, movements effortful with R knee pain and requires hand over hand assist for hand placement on BSC. pt returned to recliner for seated sponge bath with MAX A, pt able to wash face with cues for initiation and MIN A. MAX A to don gown. Pt again verbalizing need for second BM, stands at recliner and unable to make it to [facility]. TOTAL A +2 for standing pericare, pt fatigues with multiple transfers, returns to bed and MAX A using log roll technique for bed mobility.     Vision Baseline Vision/History: 1 Wears glasses Ability to See in Adequate Light: 0 Adequate Patient Visual Report: No change from baseline              Pertinent Vitals/Pain Pain  Assessment Pain Assessment: Faces Faces Pain Scale: Hurts whole lot Pain Location: R knee, low back Pain Descriptors / Indicators: Aching, Discomfort, Dull, Grimacing, Moaning Pain Intervention(s): Limited activity within patient's tolerance, Monitored during session, RN gave pain meds during session     Extremity/Trunk Assessment Upper Extremity Assessment Upper Extremity Assessment: Generalized weakness;Right hand dominant   Lower Extremity Assessment Lower Extremity Assessment: Generalized weakness;RLE deficits/detail RLE Deficits / Details: R knee OA with increased instability   Cervical / Trunk Assessment Cervical / Trunk Assessment: Kyphotic;Back Surgery (s/p kyphoplasty)   Communication Communication Communication: Impaired Factors Affecting Communication: Difficulty expressing self;Hearing impaired   Cognition Arousal: Alert Behavior During Therapy: Flat affect Cognition: No family/caregiver present to determine baseline, Cognition impaired   Orientation impairments: Situation, Time Awareness: Intellectual awareness impaired, Online awareness impaired Memory impairment (select all impairments): Short-term memory, Working memory Attention impairment (select first level of impairment): Focused attention Executive functioning impairment (select all impairments): Problem solving, Reasoning OT - Cognition Comments: pt confused, HOH and required max multimodal cuing for safety, sequencing throughout. pt quick to forget task on hand, and requries frequent reorientation                 Following commands: Impaired Following commands impaired: Follows one step commands inconsistently     Cueing  General Comments   Cueing Techniques: Verbal cues;Gestural cues;Tactile cues  RN in room during session. Edu to RN re: spinal precautions, use of TLSO for  comfort, discussed donning/doffing TLSO with RN and recommend +2 for safety during SPT t/f [facility] for future toileting  needs           Home Living Family/patient expects to be discharged to:: Skilled nursing facility Living Arrangements: Alone;Children Available Help at Discharge: Available 24 hours/day;Family Type of Home: House       Home Layout: One level               Home Equipment: Rollator (4 wheels)   Additional Comments: pt lives in ILF, staying with family in townhome after inital back fx      Prior Functioning/Environment Prior Level of Function : Needs assist             Mobility Comments: recently using rollator ADLs Comments: was able to  ambulate to BR  with rollator until few days prior to this admission    OT Problem List: Pain;Decreased strength;Decreased range of motion;Decreased activity tolerance;Impaired balance (sitting and/or standing);Decreased cognition;Decreased safety awareness;Decreased knowledge of use of DME or AE;Decreased knowledge of precautions   OT Treatment/Interventions: Self-care/ADL training;Balance training;Patient/family education;Therapeutic activities;DME and/or AE instruction;Energy conservation;Therapeutic exercise      OT Goals(Current goals can be found in the care plan section)   Acute Rehab OT Goals OT Goal Formulation: Patient unable to participate in goal setting Time For Goal Achievement: 12/25/23 Potential to Achieve Goals: Fair   OT Frequency:  Min 2X/week       AM-PAC OT 6 Clicks Daily Activity     Outcome Measure Help from another person eating meals?: A Little Help from another person taking care of personal grooming?: A Little Help from another person toileting, which includes using toliet, bedpan, or urinal?: Total Help from another person bathing (including washing, rinsing, drying)?: A Lot Help from another person to put on and taking off regular upper body clothing?: A Lot Help from another person to put on and taking off regular lower body clothing?: A Lot 6 Click Score: 13   End of Session Equipment  Utilized During Treatment: Rolling walker (2 wheels) Nurse Communication: Mobility status;Other (comment) (edu on TLSO, spinal precautions, donning/doffing brace)  Activity Tolerance: Patient tolerated treatment well Patient left: in bed;with call bell/phone within reach;with nursing/sitter in room;with bed alarm set  OT Visit Diagnosis: Muscle weakness (generalized) (M62.81);History of falling (Z91.81);Pain;Other abnormalities of gait and mobility (R26.89);Unsteadiness on feet (R26.81) Pain - Right/Left: Right Pain - part of body: Knee (back)                Time: 9088-9054 OT Time Calculation (min): 34 min Charges:  OT General Charges $OT Visit: 1 Visit OT Evaluation $OT Eval Moderate Complexity: 1 Mod OT Treatments $Self Care/Home Management : 23-37 mins  Dontez Hauss L. Byron Peacock, OTR/L  12/11/23, 1:29 PM

## 2023-12-12 DIAGNOSIS — M6281 Muscle weakness (generalized): Secondary | ICD-10-CM | POA: Diagnosis not present

## 2023-12-12 DIAGNOSIS — Z66 Do not resuscitate: Secondary | ICD-10-CM | POA: Diagnosis not present

## 2023-12-12 DIAGNOSIS — Z853 Personal history of malignant neoplasm of breast: Secondary | ICD-10-CM | POA: Diagnosis not present

## 2023-12-12 DIAGNOSIS — S32010A Wedge compression fracture of first lumbar vertebra, initial encounter for closed fracture: Secondary | ICD-10-CM | POA: Diagnosis not present

## 2023-12-12 DIAGNOSIS — D500 Iron deficiency anemia secondary to blood loss (chronic): Secondary | ICD-10-CM | POA: Diagnosis not present

## 2023-12-12 DIAGNOSIS — L89892 Pressure ulcer of other site, stage 2: Secondary | ICD-10-CM | POA: Diagnosis not present

## 2023-12-12 DIAGNOSIS — R456 Violent behavior: Secondary | ICD-10-CM | POA: Diagnosis not present

## 2023-12-12 DIAGNOSIS — R41841 Cognitive communication deficit: Secondary | ICD-10-CM | POA: Diagnosis not present

## 2023-12-12 DIAGNOSIS — I700 Atherosclerosis of aorta: Secondary | ICD-10-CM | POA: Diagnosis not present

## 2023-12-12 DIAGNOSIS — Z9889 Other specified postprocedural states: Secondary | ICD-10-CM | POA: Diagnosis not present

## 2023-12-12 DIAGNOSIS — E43 Unspecified severe protein-calorie malnutrition: Secondary | ICD-10-CM | POA: Diagnosis not present

## 2023-12-12 DIAGNOSIS — F32A Depression, unspecified: Secondary | ICD-10-CM | POA: Diagnosis not present

## 2023-12-12 DIAGNOSIS — S22080A Wedge compression fracture of T11-T12 vertebra, initial encounter for closed fracture: Secondary | ICD-10-CM | POA: Diagnosis not present

## 2023-12-12 DIAGNOSIS — M549 Dorsalgia, unspecified: Secondary | ICD-10-CM | POA: Diagnosis not present

## 2023-12-12 DIAGNOSIS — R279 Unspecified lack of coordination: Secondary | ICD-10-CM | POA: Diagnosis not present

## 2023-12-12 DIAGNOSIS — D508 Other iron deficiency anemias: Secondary | ICD-10-CM | POA: Diagnosis not present

## 2023-12-12 DIAGNOSIS — D649 Anemia, unspecified: Secondary | ICD-10-CM | POA: Diagnosis not present

## 2023-12-12 DIAGNOSIS — M48061 Spinal stenosis, lumbar region without neurogenic claudication: Secondary | ICD-10-CM | POA: Diagnosis not present

## 2023-12-12 DIAGNOSIS — R52 Pain, unspecified: Secondary | ICD-10-CM | POA: Diagnosis not present

## 2023-12-12 DIAGNOSIS — R2689 Other abnormalities of gait and mobility: Secondary | ICD-10-CM | POA: Diagnosis not present

## 2023-12-12 DIAGNOSIS — I10 Essential (primary) hypertension: Secondary | ICD-10-CM | POA: Diagnosis not present

## 2023-12-12 DIAGNOSIS — S22050D Wedge compression fracture of T5-T6 vertebra, subsequent encounter for fracture with routine healing: Secondary | ICD-10-CM | POA: Diagnosis not present

## 2023-12-12 DIAGNOSIS — Z7401 Bed confinement status: Secondary | ICD-10-CM | POA: Diagnosis not present

## 2023-12-12 DIAGNOSIS — M170 Bilateral primary osteoarthritis of knee: Secondary | ICD-10-CM | POA: Diagnosis not present

## 2023-12-12 DIAGNOSIS — R2681 Unsteadiness on feet: Secondary | ICD-10-CM | POA: Diagnosis not present

## 2023-12-12 DIAGNOSIS — E785 Hyperlipidemia, unspecified: Secondary | ICD-10-CM | POA: Diagnosis not present

## 2023-12-12 DIAGNOSIS — C7981 Secondary malignant neoplasm of breast: Secondary | ICD-10-CM | POA: Diagnosis not present

## 2023-12-12 DIAGNOSIS — F419 Anxiety disorder, unspecified: Secondary | ICD-10-CM | POA: Diagnosis not present

## 2023-12-12 DIAGNOSIS — R262 Difficulty in walking, not elsewhere classified: Secondary | ICD-10-CM | POA: Diagnosis not present

## 2023-12-12 DIAGNOSIS — S32010D Wedge compression fracture of first lumbar vertebra, subsequent encounter for fracture with routine healing: Secondary | ICD-10-CM | POA: Diagnosis not present

## 2023-12-12 DIAGNOSIS — M4855XA Collapsed vertebra, not elsewhere classified, thoracolumbar region, initial encounter for fracture: Secondary | ICD-10-CM | POA: Diagnosis not present

## 2023-12-12 DIAGNOSIS — M160 Bilateral primary osteoarthritis of hip: Secondary | ICD-10-CM | POA: Diagnosis not present

## 2023-12-12 DIAGNOSIS — Z87891 Personal history of nicotine dependence: Secondary | ICD-10-CM | POA: Diagnosis not present

## 2023-12-12 LAB — RENAL FUNCTION PANEL
Albumin: 3.4 g/dL — ABNORMAL LOW (ref 3.5–5.0)
Anion gap: 7 (ref 5–15)
BUN: 27 mg/dL — ABNORMAL HIGH (ref 8–23)
CO2: 26 mmol/L (ref 22–32)
Calcium: 9.6 mg/dL (ref 8.9–10.3)
Chloride: 107 mmol/L (ref 98–111)
Creatinine, Ser: 0.73 mg/dL (ref 0.44–1.00)
GFR, Estimated: >60 mL/min (ref >60)
Glucose, Bld: 100 mg/dL — ABNORMAL HIGH (ref 70–99)
Phosphorus: 2.5 mg/dL (ref 2.5–4.6)
Potassium: 3.8 mmol/L (ref 3.5–5.1)
Sodium: 141 mmol/L (ref 135–145)

## 2023-12-12 LAB — CBC
HCT: 34.8 % — ABNORMAL LOW (ref 36.0–46.0)
Hemoglobin: 11.3 g/dL — ABNORMAL LOW (ref 12.0–15.0)
MCH: 31.7 pg (ref 26.0–34.0)
MCHC: 32.5 g/dL (ref 30.0–36.0)
MCV: 97.8 fL (ref 80.0–100.0)
Platelets: 308 K/uL (ref 150–400)
RBC: 3.56 MIL/uL — ABNORMAL LOW (ref 3.87–5.11)
RDW: 13.4 % (ref 11.5–15.5)
WBC: 6.4 K/uL (ref 4.0–10.5)
nRBC: 0 % (ref 0.0–0.2)

## 2023-12-12 LAB — MAGNESIUM: Magnesium: 2.3 mg/dL (ref 1.7–2.4)

## 2023-12-12 MED ORDER — DICLOFENAC SODIUM 1 % EX GEL: 4.0000 g | Freq: Four times a day (QID) | CUTANEOUS | Status: AC

## 2023-12-12 MED ORDER — OXYCODONE HCL 5 MG PO TABS: 2.5000 mg | ORAL_TABLET | Freq: Four times a day (QID) | ORAL | 0 refills | Status: AC | PRN

## 2023-12-12 MED ORDER — ACETAMINOPHEN 325 MG PO TABS: 650.0000 mg | ORAL_TABLET | Freq: Four times a day (QID) | ORAL | Status: DC

## 2023-12-12 MED ORDER — SENNOSIDES-DOCUSATE SODIUM 8.6-50 MG PO TABS: 1.0000 | ORAL_TABLET | Freq: Two times a day (BID) | ORAL | Status: AC | PRN

## 2023-12-12 NOTE — Progress Notes (Signed)
 Occupational Therapy Treatment Patient Details Name: Victoria Long MRN: [id number] DOB: 08-14-1928 Today's Date: 12/12/2023   History of present illness Pt is a 89 y.o. female s/p kyphoplasty on 12/10/23. Pt presented to ED with acute back pain, found to have T6 + L2 compression fractures with 75% and 55% weight loss, neurosurgery recommends TSLO for comfort. PMH significant for bilateral knee osteoarthritis, stage Ia bilateral breast cancer s/p lumpectomy and radiations, pancreatic mass, prediabetes, vitamin D  deficiency and hyperlipidemia   OT comments  Pt seen for skilled OT treatment session this date focusing on LB ADL mgmt, toileting transfers and continued education on spinal precautions + use of TLSO for comfort. Pt endorses improvement in pain to 6/10 with use of TLSO and mobility, pain start of session 7/10 (requested pain meds from RN upon arrival to room). Pt requires up to MIN A for bed mobility, performs functional STS transfers with MIN - MOD A, and requires MAX A for LB ADLs. Pt with much improved cognition from yesterday's session, however continues to benefit from max multimodal cuing for hand placement during transfers with poor carryover noted. Discharge recommendation appropriate, OT will continue to follow. Patient will benefit from continued inpatient follow up therapy, <3 hours/day       If plan is discharge home, recommend the following:  Two people to help with walking and/or transfers;A lot of help with bathing/dressing/bathroom;Assist for transportation;Help with stairs or ramp for entrance   Equipment Recommendations  None recommended by OT       Precautions / Restrictions Precautions Precautions: Back;Fall Recall of Precautions/Restrictions: Impaired Precaution/Restrictions Comments: urinary incontinence Required Braces or Orthoses: Spinal Brace Spinal Brace: Thoracolumbosacral orthotic;Other (comment) Spinal Brace Comments: TLSO brace for comfort (can  come off when in bed) per secure chat with MD s/p kyphoplasty Restrictions Weight Bearing Restrictions Per Provider Order: No       Mobility Bed Mobility Overal bed mobility: Needs Assistance Bed Mobility: Supine to Sit     Supine to sit: Min assist     General bed mobility comments: pt recieved in long sitting, able to pivot on hips to transition to EOB    Transfers Overall transfer level: Needs assistance Equipment used: Rolling walker (2 wheels) Transfers: Sit to/from Stand, Bed to chair/wheelchair/BSC Sit to Stand: Min assist, Mod assist     Step pivot transfers: Mod assist     General transfer comment: MIN - MOD A using RW. cues for hand placement. poor carryover throughout, pt consistently requires hand over hand assist to reach back instead of pulling back on RW     Balance Overall balance assessment: Needs assistance Sitting-balance support: No upper extremity supported, Feet supported Sitting balance-Leahy Scale: Fair     Standing balance support: Reliant on assistive device for balance, During functional activity, Bilateral upper extremity supported Standing balance-Leahy Scale: Poor Standing balance comment: fatigues quickly, baseline knee OA, unsteady and poor carryover of hand placement / technique throughout                           ADL either performed or assessed with clinical judgement   ADL Overall ADL's : Needs assistance/impaired     Grooming: Wash/dry hands;Wash/dry face;Oral care;Sitting;Set up Grooming Details (indicate cue type and reason): setup on BSC after pericare, oral care seated in recliner with setup. pt able to verbalize wants/needs this session     Lower Body Bathing: Sit to/from stand;Cueing for back precautions;Maximal assistance Lower Body Bathing Details (  indicate cue type and reason): cues for back precautions to perform from STS Upper Body Dressing : Maximal assistance;Sitting;Standing Upper Body Dressing Details  (indicate cue type and reason): maxA to don TLSO while on BSC, pt endorsing improved pain once donned Lower Body Dressing: Maximal assistance;Sit to/from stand;Cueing for back precautions Lower Body Dressing Details (indicate cue type and reason): MAX A to thread underwear over feet and pull over hips. pt unsteady, and requires BUE support on RW, does not have sufficient dynamic balance to pull underwear up over hips in standing Toilet Transfer: BSC/3in1;Stand-pivot;Cueing for sequencing;Cueing for safety Toilet Transfer Details (indicate cue type and reason): SPT bed > BSC. cues for hand placement with poor carryover Toileting- Clothing Manipulation and Hygiene: Maximal assistance;Sit to/from stand;Cueing for back precautions       Functional mobility during ADLs: Minimal assistance;Cueing for sequencing;Cueing for safety;Rolling walker (2 wheels) General ADL Comments: pt reports 7/10 pain while in bed, pain decreases to 6/10 after mobility + ADL performance. TLSO donned in seated/adjusted in standing for comfort/fit.     Communication Communication Communication: Impaired Factors Affecting Communication: Hearing impaired   Cognition Arousal: Alert Behavior During Therapy: WFL for tasks assessed/performed Cognition: Cognition impaired     Awareness: Intellectual awareness impaired, Online awareness impaired Memory impairment (select all impairments): Short-term memory, Working memory Attention impairment (select first level of impairment): Focused attention Executive functioning impairment (select all impairments): Problem solving, Reasoning OT - Cognition Comments: pt with improved cognition, continues to require hand over hand placement for transfers (uses rollator baseline).                 Following commands: Impaired Following commands impaired: Follows one step commands with increased time      Cueing   Cueing Techniques: Verbal cues, Gestural cues, Tactile cues         General Comments Edu to pt and son expectations for OT at next level of care    Pertinent Vitals/ Pain       Pain Assessment Pain Assessment: 0-10 Pain Score: 7  Pain Location: R knee, low back Pain Descriptors / Indicators: Aching, Discomfort, Dull, Grimacing, Moaning Pain Intervention(s): Limited activity within patient's tolerance, Monitored during session, Patient requesting pain meds-RN notified   Frequency  Min 2X/week        Progress Toward Goals  OT Goals(current goals can now be found in the care plan section)  Progress towards OT goals: Progressing toward goals  Acute Rehab OT Goals OT Goal Formulation: With patient Time For Goal Achievement: 12/25/23 Potential to Achieve Goals: Fair ADL Goals Pt Will Perform Upper Body Dressing: sitting;with supervision Pt Will Perform Lower Body Dressing: with min assist;sit to/from stand Pt Will Transfer to Toilet: ambulating;with min assist;bedside commode;grab bars Pt Will Perform Toileting - Clothing Manipulation and hygiene: with min assist;sitting/lateral leans;sit to/from stand  Plan         AM-PAC OT 6 Clicks Daily Activity     Outcome Measure   Help from another person eating meals?: A Little Help from another person taking care of personal grooming?: A Little Help from another person toileting, which includes using toliet, bedpan, or urinal?: Total Help from another person bathing (including washing, rinsing, drying)?: A Lot Help from another person to put on and taking off regular upper body clothing?: A Lot Help from another person to put on and taking off regular lower body clothing?: A Lot 6 Click Score: 13    End of Session Equipment Utilized During Treatment: Gait  belt;Rolling walker (2 wheels);Back brace  OT Visit Diagnosis: Muscle weakness (generalized) (M62.81);History of falling (Z91.81);Pain;Other abnormalities of gait and mobility (R26.89);Unsteadiness on feet (R26.81) Pain - Right/Left:  Right Pain - part of body: Knee   Activity Tolerance Patient tolerated treatment well   Patient Left in chair;with call bell/phone within reach;with chair alarm set   Nurse Communication Patient requests pain meds;Mobility status        Time: 9094-9054 OT Time Calculation (min): 40 min  Charges: OT General Charges $OT Visit: 1 Visit OT Treatments $Self Care/Home Management : 38-52 mins  Honour Schwieger L. [place] Weekly, OTR/L  12/12/23, 11:06 AM

## 2023-12-12 NOTE — Plan of Care (Signed)

## 2023-12-12 NOTE — Plan of Care (Signed)

## 2023-12-12 NOTE — TOC Transition Note (Signed)
 Transition of Care [facility]) - Discharge Note   Patient Details  Name: Victoria Long MRN: [id number] Date of Birth: [birth year]  Transition of [facility] CM/SW Contact:  Alfonse JONELLE Rex, RN Phone Number: 12/12/2023, 2:13 PM   Clinical Narrative:   DC order to [facility], [facility],  [facility] for transport . No further INPT CM needs identified at this time.     Final next level of care: Skilled Nursing Facility Barriers to Discharge: Barriers Resolved   Patient Goals and CMS Choice Patient states their goals for this hospitalization and ongoing recovery are:: return home CMS Medicare.gov Compare Post Acute Care list provided to:: Patient Choice offered to / list presented to : Patient [place] ownership interest in [facility].provided to:: Patient    Discharge Placement              Patient chooses bed at: [facility] Patient to be transferred to facility by: [facility] Name of family member notified: family at bedside Patient and family notified of of transfer: 12/12/23  Discharge Plan and Services Additional resources added to the After Visit Summary for                                       Social Drivers of Health (SDOH) Interventions SDOH Screenings   Food Insecurity: No Food Insecurity (12/08/2023)  Housing: Low Risk  (12/08/2023)  Transportation Needs: No Transportation Needs (12/08/2023)  Utilities: Not At Risk (12/08/2023)  Alcohol Screen: Low Risk  (11/25/2023)  Depression (PHQ2-9): Low Risk  (11/25/2023)  Financial Resource Strain: Low Risk  (11/25/2023)  Physical Activity: Inactive (11/25/2023)  Social Connections: Socially Isolated (12/08/2023)  Stress: No Stress Concern Present (11/25/2023)  Tobacco Use: Medium Risk (12/08/2023)  Health Literacy: Adequate Health Literacy (11/25/2023)     Readmission Risk Interventions     No data to display

## 2023-12-12 NOTE — Progress Notes (Signed)
 Report called to facility ([facility]). Pt transported via [facility] w all belongings in stable condition.

## 2023-12-12 NOTE — Discharge Summary (Signed)
 Physician Discharge Summary  Victoria Long FMW:[id number] DOB: [birth year] DOA: 12/08/2023  PCP: Ozell Heron HERO, MD  Admit date: 12/08/2023 Discharge date: 12/12/23  Admitted From: Home. Disposition: SNF Recommendations for Outpatient Follow-up:  Check CMP and CBC in 1 week Please follow up on the following pending results: None  Discharge Condition: Stable CODE STATUS: DNR Diet Orders (From admission, onward)     Start     Ordered   12/10/23 1733  Diet regular Room service appropriate? Yes; Fluid consistency: Thin  Diet effective now       Question Answer Comment  Room service appropriate? Yes   Fluid consistency: Thin      12/10/23 1732              Hospital course 89 year old F with PMH of bilateral knee osteoarthritis, stage Ia bilateral breast cancer s/p lumpectomy and radiations, pancreatic mass, prediabetes, vitamin D  deficiency and hyperlipidemia presented to ED for the second time in 4 days due to acute back pain, and found to have subacute T6 and acute L1 compression fractures with 75% and 55% weight loss respectively without significant retropulsion as noted on MRI.  Neurosurgery consulted and recommended TLSO for comfort and possible kyphoplasty by IR.  IR consulted.   Patient underwent kyphoplasty for acute L1 compression fracture on 11/19.  Evaluated by therapy and was recommended short-term rehab.   See individual problem list below for more.   Problems addressed during this hospitalization Subacute T6 compression fracture with 75% height loss without significant retropulsion or stenosis Acute L1 compression fracture with 55% height loss without significant retropulsion -No focal neurodeficits, bowel or bladder habit change.  Vitamin D  62.63. -Per EDP, NSGY recommended TLSO brace for comfort (can come off when in bed) and possible kyphoplasty. -Underwent kyphoplasty for acute L1 compression fracture on 11/19. -Multimodal pain control with  scheduled Tylenol , as needed low-dose oxycodone . -Bowel regimen - Continue PT/OT.   Bilateral knee osteoarthritis -Pain medications as above -Continue topical Voltaren  gel every 6 hours   Essential hypertension: BP slightly elevated partly from pain and holding BP med - Pain control as above - Resume home antihypertensive meds.   Normocytic anemia: Stable. - Continue monitoring   Advance care planning: Discussed CODE STATUS with patient's son.  Son brought a copy of a DNR.   Severe malnutrition Body mass index is 18.29 kg/m. Nutrition Problem: Severe Malnutrition Etiology: acute illness (compression fx of vertebra) Signs/Symptoms: severe fat depletion, severe muscle depletion, percent weight loss Interventions: MVI, Snacks, Liberalize Diet     Consultations: Interventional radiology  Time spent 35  minutes  Vital signs Vitals:   12/11/23 1318 12/11/23 2046 12/12/23 0538 12/12/23 0546  BP: (!) 119/54 Comment: notify to the nurse. (!) 159/81 (!) 155/68 (!) 160/66  Pulse: 74 76 76 75  Temp: 97.6 F (36.4 C) 97.8 F (36.6 C) (!) 97.5 F (36.4 C) (!) 97.5 F (36.4 C)  Resp: 18 15 15 18   Height:      Weight:      SpO2: 100% 100% 100% 98%  TempSrc: Oral Oral Oral Oral  BMI (Calculated):         Discharge exam  GENERAL: No apparent distress.  Appears frail. HEENT: MMM.  Vision and hearing grossly intact.  NECK: Supple.  No apparent JVD.  RESP:  No IWOB.  Fair aeration bilaterally. CVS:  RRR. Heart sounds normal.  ABD/GI/GU: Soft brace in place. MSK/EXT:  Moves all extremities. No apparent deformity. No edema.  SKIN:  no apparent skin lesion or wound NEURO: AA.  Oriented x 4 except date.  No apparent focal neuro deficit. PSYCH: Calm. Normal affect.   Discharge Instructions Discharge Instructions     Increase activity slowly   Complete by: As directed       Allergies as of 12/12/2023       Reactions   Anastrozole  Other (See Comments)   Made the patient  feel loopy   Tramadol  Other (See Comments)   Medication caused dizziness        Medication List     STOP taking these medications    Advil 200 MG Caps Generic drug: Ibuprofen       TAKE these medications    acetaminophen  325 MG tablet Commonly known as: TYLENOL  Take 2 tablets (650 mg total) by mouth every 6 (six) hours. What changed:  medication strength how much to take when to take this additional instructions   Artificial Tears 1 % ophthalmic solution Generic drug: carboxymethylcellulose Place 1 drop into both eyes 3 (three) times daily as needed (for dryness).   calcium  carbonate 600 MG Tabs tablet Commonly known as: OS-CAL Take 600 mg by mouth daily with breakfast.   Debrox 6.5 % OTIC solution Generic drug: carbamide peroxide Place 5 drops into both ears 2 (two) times daily as needed (as directed to clear the ear canals).   diclofenac  Sodium 1 % Gel Commonly known as: Voltaren  Apply 4 g topically 4 (four) times daily. To both knees What changed:  when to take this reasons to take this additional instructions   famotidine  20 MG tablet Commonly known as: PEPCID  TAKE 1 TABLET BY MOUTH EVERY NIGHT AT BEDTIME What changed:  how much to take how to take this when to take this reasons to take this additional instructions   hydrocortisone  2.5 % rectal cream Commonly known as: Anusol -HC Place 1 Application rectally 2 (two) times daily.   lidocaine  5 % Commonly known as: Lidoderm  Place 1 patch onto the skin daily. Remove & Discard patch within 12 hours or as directed by MD What changed: additional instructions   LYSINE HCL PO Take 500 mg by mouth daily.   MiraLax  17 GM/SCOOP powder Generic drug: polyethylene glycol powder Take 8.5 g by mouth See admin instructions. Dissolve 8.5 grams into 4-8 ounces of coffee and drink by mouth in the morning   MULTIVITAMIN PO Take 1 tablet by mouth daily with breakfast.   olmesartan  20 MG tablet Commonly  known as: BENICAR  Take 1 tablet (20 mg total) by mouth daily.   oxyCODONE  5 MG immediate release tablet Commonly known as: Oxy IR/ROXICODONE  Take 0.5 tablets (2.5 mg total) by mouth every 6 (six) hours as needed for up to 7 days for moderate pain (pain score 4-6) or breakthrough pain.   senna-docusate 8.6-50 MG tablet Commonly known as: Senokot-S Take 1 tablet by mouth 2 (two) times daily between meals as needed for mild constipation.   VITAMIN B 12 PO Take 1 tablet by mouth daily.   Vitamin D3 50 MCG (2000 UT) Tabs Take 2,000 Units by mouth daily.         Procedures/Studies: L1 kyphoplasty on 11/19   IR KYPHO LUMBAR INC FX REDUCE BONE BX UNI/BIL CANNULATION INC/IMAGING Result Date: 12/10/2023 CLINICAL DATA:  Painful lumbar L1 vertebral compression fracture. No retropulsion. EXAM: KYPHOPLASTY AT LUMBAR L1 TECHNIQUE: The procedure, risks (including but not limited to bleeding, infection, organ damage), benefits, and alternatives were explained to the patient and family. Questions  regarding the procedure were encouraged and answered. The family understands and consents to the procedure. The patient was placed prone on the fluoroscopic table. The skin overlying the upper lumbar region was then prepped and draped in the usual sterile fashion. Maximal barrier sterile technique was utilized including caps, mask, sterile gowns, sterile gloves, sterile drape, hand hygiene and skin antiseptic. Intravenous Fentanyl  75mcg and Versed  0.5mg  were administered by RN during a total moderate (conscious) sedation time of 27 minutes; the patient's level of consciousness and physiological / cardiorespiratory status were monitored continuously by radiology RN under my direct supervision. As antibiotic prophylaxis, cefazolin  2 g was ordered pre-procedure and administered intravenously within !one hour! of incision. The right pedicle at lumbar L1 was then infiltrated with 1% lidocaine  followed by the  advancement of a Kyphon trocar needle through the right pedicle into the posterior one-third. The trocar was removed and the osteo drill was advanced to the anterior third of the vertebral body. The osteo drill was retracted. Through the working cannula, a Kyphon inflatable bone tamp was advanced and positioned with the distal marker 5 mm from the anterior aspect of the cortex. Crossing of the midline was not seen on the AP projection. At this time, the balloon was expanded using contrast via a Kyphon inflation syringe device via micro tubing. In similar fashion, the left pedicle was infiltrated with 1% lidocaine  followed by the advancement of a second Kyphon trocar needle through the left pedicle into the posterior third of the vertebral body. The osteo drill was coaxially advanced to the anterior third. The osteo drill was exchanged for a Kyphon inflatable bone tamp , advanced to within 5 mm of the anterior aspect of the cortex. The balloon was then expanded using contrast as above. Inflations were continued until there was near apposition across the midline and with the superior and the inferior end plates. At this time, methylmethacrylate mixture was reconstituted in the Kyphon bone mixing device system. This was then loaded into the delivery mechanism, attached to Kyphon bone fillers. The balloons were deflated and removed followed by the instillation of methylmethacrylate mixture with excellent filling in the AP and lateral projections. No extravasation was noted in the disk spaces or posteriorly into the spinal canal. No epidural venous contamination was seen. The patient tolerated the procedure well. There were no acute complications. The working cannulae and the bone filler were then retrieved and removed. COMPLICATIONS: COMPLICATIONS None immediate. IMPRESSION: 1. Status post vertebral body augmentation using balloon kyphoplasty at lumbar L1 as described without event. 2. Per CMS reporting requirements  (MIPS measure 24): Given the patient's age of greater than 50 and the fracture site (hip, distal radius, or spine), the patient should be tested for osteoporosis using DXA, and the appropriate treatment considered based on the DXA results. Electronically Signed   By: JONETTA Faes M.D.   On: 12/10/2023 17:08   MR Lumbar Spine W Wo Contrast Result Date: 12/08/2023 EXAM: MRI LUMBAR SPINE 12/08/2023 01:53:00 PM TECHNIQUE: Multiplanar multisequence MRI of the lumbar spine was performed with and without the administration of intravenous contrast. COMPARISON: Lumbar spine CT 12/04/2023. CLINICAL HISTORY: Compression fracture, lumbar. Worsening low back pain. FINDINGS: BONES AND ALIGNMENT: 5 lumbar-type vertebrae. Slight upper lumbar levoscoliosis. Unchanged grade 1 anterolisthesis of L4 and L5. L1 compression fracture with mildly progressive vertebral body height loss, now measuring 55%. Marrow edema throughout the L1 vertebral body. No significant retropulsion. No mass. SPINAL CORD: The conus medullaris terminates at L2 and is normal in  signal. SOFT TISSUES: Mild paraspinal soft tissue edema in the upper lumbar spine. DISC LEVELS: Disc desiccation throughout the lumbar spine. Moderate disc space narrowing at L4-L5 and L5-S1. T12-L1: Minimal disc bulging without stenosis. L1-L2: Mild disc bulging and mild facet and ligamentum flavum hypertrophy without stenosis. L2-L3: Disc bulging and moderate facet and ligamentum flavum hypertrophy without stenosis. L3-L4: Disc bulging and severe facet and ligamentum flavum hypertrophy result in mild right neural foraminal stenosis without spinal stenosis. L4-L5: Anterolisthesis with diffuse bulging of uncovered disc and severe facet and ligamentum flavum hypertrophy result in moderate spinal stenosis, moderate bilateral lateral recess stenosis, and mild right and moderate left neural foraminal stenosis. L5-S1: Disc bulging and moderate facet and ligamentum flavum hypertrophy result in  mild bilateral lateral recess stenosis. IMPRESSION: 1. Acute/subacute L1 compression fracture with mildly progressive height loss, now 55%. No retropulsion. 2. Moderate spinal stenosis at L4-L5. Electronically signed by: Dasie Hamburg MD 12/08/2023 02:44 PM EST RP Workstation: [id number]   MR THORACIC SPINE W WO CONTRAST Result Date: 12/08/2023 EXAM: MRI THORACIC SPINE WITHOUT AND WITH INTRAVENOUS CONTRAST 12/08/2023 01:53:00 PM TECHNIQUE: Multiplanar multisequence MRI of the thoracic spine was performed without and with the administration of intravenous contrast. COMPARISON: Thoracic spine CT 12/04/2023 and MRI 10/09/2023. CLINICAL HISTORY: Compression fracture, thoracic. Worsening back pain. FINDINGS: BONES AND ALIGNMENT: Exaggerated thoracic kyphosis. No significant listhesis. T6 compression fracture with unchanged 75% vertebral body height loss and persistent moderate marrow edema without retropulsion. No new thoracic compression fracture. Unchanged small sclerotic foci in the T8, T9, and T11 vertebral bodies and right T9 transverse process as previously noted, possibly bone islands. No suspicious enhancing lesion. Atypical hemangioma in the C6 vertebral body. SPINAL CORD: Normal spinal cord volume. Normal spinal cord signal. SOFT TISSUES: Small bilateral pleural effusions, left larger than right. DEGENERATIVE CHANGES: Incompletely evaluated degenerative changes in the cervical spine. No more than mild disc degeneration and facet arthrosis in the thoracic spine without high grade spinal canal or neural foraminal stenosis. Disc bulging at T6-T7 contacts the ventral spinal cord. IMPRESSION: 1. Subacute T6 compression fracture with unchanged 75% height loss and persistent moderate marrow edema without retropulsion. 2. No new thoracic spine fracture or compressive stenosis. 3. Small bilateral pleural effusions. Electronically signed by: Dasie Hamburg MD 12/08/2023 02:33 PM EST RP Workstation: [id number]   CT  Lumbar Spine Wo Contrast Result Date: 12/04/2023 EXAM: CT OF THE LUMBAR SPINE WITHOUT CONTRAST 12/04/2023 04:49:00 PM TECHNIQUE: CT of the lumbar spine was performed without the administration of intravenous contrast. Multiplanar reformatted images are provided for review. Automated exposure control, iterative reconstruction, and/or weight based adjustment of the mA/kV was utilized to reduce the radiation dose to as low as reasonably achievable. COMPARISON: MRI thoracic spine 10/09/2023 CLINICAL HISTORY: Low back pain, increased fracture risk. FINDINGS: BONES AND ALIGNMENT: 5 lumbar type vertebrae. Slight upper lumbar levoscoliosis. Trace retrolisthesis of L2 on L3. 4 mm anterolisthesis of L4 on L5. L1 inferior endplate compression fracture with 40% vertebral body height loss, new from the prior thoracic MRI. Diffuse osteopenia. No destructive bone lesion. DEGENERATIVE CHANGES: Moderate disc space narrowing at L4-L5 and L5-S1. No more than mild disc space narrowing elsewhere. Mild spinal stenosis at L3-L4 and moderate to severe stenosis at L4-L5 due to disc bulging and prominent posterior element hypertrophy. Mild to moderate left greater than right neural foraminal stenosis at L4-L5. SOFT TISSUES: Mild paravertebral soft tissue edema at L1. Aortic atherosclerosis. Bilateral renal sinus cysts for which no follow-up imaging is required. IMPRESSION: 1. L1  compression fracture with 40% vertebral body height loss, new from 10/09/2023. 2. Moderate to severe spinal stenosis at L4-L5 and mild spinal stenosis at L3-L4. Electronically signed by: Dasie Hamburg MD 12/04/2023 05:10 PM EST RP Workstation: [id number]   CT Thoracic Spine Wo Contrast Result Date: 12/04/2023 EXAM: CT THORACIC SPINE WITHOUT CONTRAST 12/04/2023 04:49:00 PM TECHNIQUE: CT of the thoracic spine was performed without the administration of intravenous contrast. Multiplanar reformatted images are provided for review. Automated exposure control,  iterative reconstruction, and/or weight based adjustment of the mA/kV was utilized to reduce the radiation dose to as low as reasonably achievable. COMPARISON: MRI thoracic spine 10/09/2023. CLINICAL HISTORY: Mid-back pain, prior compression fracture. Low back pain for several weeks, worse for the past 3 days. FINDINGS: BONES AND ALIGNMENT: T6 compression fracture with 75% vertebral body height loss, similar to the prior MRI. Heterogeneous sclerosis throughout the T6 vertebral body. No retropulsion. Background of diffuse osteopenia. No acute thoracic spine fracture. Small sclerotic foci in the T8, T9, and T11 vertebral bodies and right T9 transverse process, possibly bone islands. No destructive bone lesion. Mild mid to upper thoracic dextroscoliosis and mild lower thoracic levoscoliosis. Increased upper thoracic kyphosis. DEGENERATIVE CHANGES: Moderately advanced disc degeneration at C5-C6 and C6-C7. No more than mild multilevel disc degeneration in the thoracic spine. No evidence of high grade thoracic spinal canal or neural foraminal stenosis. SOFT TISSUES: Aortic and coronary atherosclerosis. Trace bilateral pleural effusions. Scarring/fibrosis in the anterior left lung. IMPRESSION: 1. T6 compression fracture with 75% height loss, nonprogressed from 10/09/2023. 2. No acute thoracic spine fracture. 3. Mild thoracic disc degeneration without evidence of high-grade stenosis. 4. Trace bilateral pleural effusions. Electronically signed by: Dasie Hamburg MD 12/04/2023 05:05 PM EST RP Workstation: [id number]       The results of significant diagnostics from this hospitalization (including imaging, microbiology, ancillary and laboratory) are listed below for reference.     Microbiology: No results found for this or any previous visit (from the past 240 hours).   Labs:  CBC: Recent Labs  Lab 12/08/23 1015 12/09/23 0519 12/10/23 0501 12/12/23 0453  WBC 6.7 8.4 6.4 6.4  NEUTROABS 4.7  --   --   --    HGB 10.9* 12.1 11.5* 11.3*  HCT 33.2* 37.1 34.8* 34.8*  MCV 98.8 95.9 96.4 97.8  PLT 262 313 310 308   BMP &GFR Recent Labs  Lab 12/08/23 1015 12/09/23 0519 12/10/23 0501 12/12/23 0453  NA 142 142 141 141  K 4.0 3.8 4.0 3.8  CL 108 106 106 107  CO2 27 25 26 26   GLUCOSE 93 99 99 100*  BUN 20 19 19  27*  CREATININE 0.71 0.57 0.60 0.73  CALCIUM  9.5 9.8 9.7 9.6  MG  --   --  2.0 2.3  PHOS  --   --  3.3 2.5   Estimated Creatinine Clearance: 31.5 mL/min (by C-G formula based on SCr of 0.73 mg/dL). Liver & Pancreas: Recent Labs  Lab 12/09/23 0519 12/10/23 0501 12/12/23 0453  AST 21  --   --   ALT 14  --   --   ALKPHOS 77  --   --   BILITOT 0.5  --   --   PROT 6.5  --   --   ALBUMIN 3.6 3.4* 3.4*   No results for input(s): LIPASE, AMYLASE in the last 168 hours. No results for input(s): AMMONIA in the last 168 hours. Diabetic: No results for input(s): HGBA1C in the last 72 hours. No results  for input(s): GLUCAP in the last 168 hours. Cardiac Enzymes: No results for input(s): CKTOTAL, CKMB, CKMBINDEX, TROPONINI in the last 168 hours. No results for input(s): PROBNP in the last 8760 hours. Coagulation Profile: Recent Labs  Lab 12/09/23 1107  INR 1.1   Thyroid  Function Tests: No results for input(s): TSH, T4TOTAL, FREET4, T3FREE, THYROIDAB in the last 72 hours. Lipid Profile: No results for input(s): CHOL, HDL, LDLCALC, TRIG, CHOLHDL, LDLDIRECT in the last 72 hours. Anemia Panel: No results for input(s): VITAMINB12, FOLATE, FERRITIN, TIBC, IRON, RETICCTPCT in the last 72 hours. Urine analysis:    Component Value Date/Time   COLORURINE YELLOW 12/04/2023 1728   APPEARANCEUR CLEAR 12/04/2023 1728   LABSPEC 1.023 12/04/2023 1728   PHURINE 6.5 12/04/2023 1728   GLUCOSEU NEGATIVE 12/04/2023 1728   HGBUR NEGATIVE 12/04/2023 1728   BILIRUBINUR NEGATIVE 12/04/2023 1728   BILIRUBINUR negative 10/23/2023 1440    KETONESUR TRACE (A) 12/04/2023 1728   PROTEINUR TRACE (A) 12/04/2023 1728   UROBILINOGEN negative (A) 10/23/2023 1440   UROBILINOGEN 0.2 08/09/2014 1505   NITRITE NEGATIVE 12/04/2023 1728   LEUKOCYTESUR NEGATIVE 12/04/2023 1728   Sepsis Labs: Invalid input(s): PROCALCITONIN, LACTICIDVEN   SIGNED:  Mignon ONEIDA Bump, MD  [facility] 12/12/2023, 9:02 AM

## 2023-12-15 DIAGNOSIS — F32A Depression, unspecified: Secondary | ICD-10-CM | POA: Diagnosis not present

## 2023-12-15 DIAGNOSIS — F419 Anxiety disorder, unspecified: Secondary | ICD-10-CM | POA: Diagnosis not present

## 2023-12-16 DIAGNOSIS — D508 Other iron deficiency anemias: Secondary | ICD-10-CM | POA: Diagnosis not present

## 2023-12-16 DIAGNOSIS — S32010D Wedge compression fracture of first lumbar vertebra, subsequent encounter for fracture with routine healing: Secondary | ICD-10-CM | POA: Diagnosis not present

## 2023-12-16 DIAGNOSIS — C7981 Secondary malignant neoplasm of breast: Secondary | ICD-10-CM | POA: Diagnosis not present

## 2023-12-16 DIAGNOSIS — L89892 Pressure ulcer of other site, stage 2: Secondary | ICD-10-CM | POA: Diagnosis not present

## 2023-12-17 DIAGNOSIS — R52 Pain, unspecified: Secondary | ICD-10-CM | POA: Diagnosis not present

## 2023-12-17 DIAGNOSIS — R456 Violent behavior: Secondary | ICD-10-CM | POA: Diagnosis not present

## 2023-12-19 DIAGNOSIS — M6281 Muscle weakness (generalized): Secondary | ICD-10-CM | POA: Diagnosis not present

## 2023-12-19 DIAGNOSIS — M160 Bilateral primary osteoarthritis of hip: Secondary | ICD-10-CM | POA: Diagnosis not present

## 2023-12-19 DIAGNOSIS — R2689 Other abnormalities of gait and mobility: Secondary | ICD-10-CM | POA: Diagnosis not present

## 2023-12-19 DIAGNOSIS — Z9889 Other specified postprocedural states: Secondary | ICD-10-CM

## 2023-12-19 DIAGNOSIS — E43 Unspecified severe protein-calorie malnutrition: Secondary | ICD-10-CM | POA: Diagnosis not present

## 2023-12-23 DIAGNOSIS — L89892 Pressure ulcer of other site, stage 2: Secondary | ICD-10-CM | POA: Diagnosis not present

## 2023-12-23 DIAGNOSIS — D508 Other iron deficiency anemias: Secondary | ICD-10-CM | POA: Diagnosis not present

## 2023-12-23 DIAGNOSIS — C7981 Secondary malignant neoplasm of breast: Secondary | ICD-10-CM | POA: Diagnosis not present

## 2023-12-23 DIAGNOSIS — S32010D Wedge compression fracture of first lumbar vertebra, subsequent encounter for fracture with routine healing: Secondary | ICD-10-CM | POA: Diagnosis not present

## 2023-12-25 DIAGNOSIS — R279 Unspecified lack of coordination: Secondary | ICD-10-CM | POA: Diagnosis not present

## 2023-12-25 DIAGNOSIS — M6281 Muscle weakness (generalized): Secondary | ICD-10-CM | POA: Diagnosis not present

## 2023-12-29 ENCOUNTER — Telehealth: Payer: Self-pay

## 2023-12-29 NOTE — Transitions of Care (Post Inpatient/ED Visit) (Unsigned)
   12/29/2023  Name: Marykate Heuberger MRN: [id number] DOB: Dec 18, 1928  Today's TOC FU Call Status: Today's TOC FU Call Status:: Unsuccessful Call (1st Attempt) Unsuccessful Call (1st Attempt) Date: 12/29/23  Attempted to reach the patient regarding the most recent Inpatient/ED visit.  Follow Up Plan: Additional outreach attempts will be made to reach the patient to complete the Transitions of Care (Post Inpatient/ED visit) call.   Signature Julian Lemmings, LPN [facility] Nurse Health Advisor Direct Dial [phone]

## 2023-12-30 NOTE — Transitions of Care (Post Inpatient/ED Visit) (Signed)
 12/30/2023  Name: Victoria Long MRN: [id number] DOB: September 02, 1928  Today's TOC FU Call Status: Today's TOC FU Call Status:: Successful TOC FU Call Completed Unsuccessful Call (1st Attempt) Date: 12/29/23 [facility] FU Call Complete Date: 12/30/23  Patient's Name and Date of Birth confirmed. Name, DOB  Transition Care Management Follow-up Telephone Call Date of Discharge: 12/26/23 Discharge Facility: Other [facility]) Name of Other ([facility]) Discharge Facility: [facility] Type of Discharge: Inpatient Admission Primary Inpatient Discharge Diagnosis:: fracture lumbar vertebra How have you been since you were released from the hospital?: Better Any questions or concerns?: No  Items Reviewed: Did you receive and understand the discharge instructions provided?: Yes Medications obtained,verified, and reconciled?: Yes (Medications Reviewed) Any new allergies since your discharge?: No Dietary orders reviewed?: Yes Do you have support at home?: Yes People in Home [RPT]: facility resident  Medications Reviewed Today: Medications Reviewed Today     Reviewed by Emmitt Pan, LPN (Licensed Practical Nurse) on 12/30/23 at 1134  Med List Status: <None>   Medication Order Taking? Sig Documenting Provider Last Dose Status Informant  acetaminophen  (TYLENOL ) 325 MG tablet [id number] Yes Take 2 tablets (650 mg total) by mouth every 6 (six) hours. Gonfa, Taye T, MD  Active   ARTIFICIAL TEARS 1 % ophthalmic solution [id number] Yes Place 1 drop into both eyes 3 (three) times daily as needed (for dryness). [provider]  Active Multiple Informants  calcium  carbonate (OS-CAL) 600 MG TABS [id number] Yes Take 600 mg by mouth daily with breakfast. [provider]  Active Multiple Informants  Cholecalciferol  (VITAMIN D3) 50 MCG (2000 UT) TABS [id number] Yes Take 2,000 Units by mouth daily. [provider]  Active Multiple Informants  Cyanocobalamin  (VITAMIN B 12 PO)  [id number] Yes Take 1 tablet by mouth daily. [provider]  Active Multiple Informants           Med Note MARISA, NATHANEL LOISE Kitchens Dec 08, 2023  4:59 PM) Strength not noted  DEBROX 6.5 % OTIC solution [id number] Yes Place 5 drops into both ears 2 (two) times daily as needed (as directed to clear the ear canals). [provider]  Active Multiple Informants  diclofenac  Sodium (VOLTAREN ) 1 % GEL [id number] Yes Apply 4 g topically 4 (four) times daily. To both knees Kathrin Mignon DASEN, MD  Active   famotidine  (PEPCID ) 20 MG tablet [id number] Yes TAKE 1 TABLET BY MOUTH EVERY NIGHT AT BEDTIME Ozell Heron HERO, MD  Active Multiple Informants  hydrocortisone  (ANUSOL -HC) 2.5 % rectal cream [id number] Yes Place 1 Application rectally 2 (two) times daily. Ozell Heron HERO, MD  Active Multiple Informants  lidocaine  (LIDODERM ) 5 % [id number] Yes Place 1 patch onto the skin daily. Remove & Discard patch within 12 hours or as directed by MD  Patient taking differently: Place 1 patch onto the skin daily. Apply 1 new patch to the lower back and remove & discard patch within 12 hours or as directed by MD   Neysa Caron PARAS, DO  Active Multiple Informants  LYSINE HCL PO [id number] Yes Take 500 mg by mouth daily.  [provider]  Active Multiple Informants  MIRALAX  17 GM/SCOOP powder [id number] Yes Take 8.5 g by mouth See admin instructions. Dissolve 8.5 grams into 4-8 ounces of coffee and drink by mouth in the morning [provider]  Active Multiple Informants  Multiple Vitamins-Minerals (MULTIVITAMIN PO) [id number] Yes Take 1 tablet by mouth daily with breakfast. [provider]  Active Multiple Informants  olmesartan  (BENICAR )  20 MG tablet [id number] Yes Take 1 tablet (20 mg total) by mouth daily. Ozell Heron HERO, MD  Active Multiple Informants  senna-docusate (SENOKOT-S) 8.6-50 MG tablet [id number] Yes Take 1 tablet by mouth 2 (two) times daily between meals as needed for mild  constipation. Kathrin Mignon DASEN, MD  Active             Home Care and Equipment/Supplies: Were Home Health Services Ordered?: Yes Name of Home Health Agency:: [facility] Has Agency set up a time to come to your home?: No Any new equipment or medical supplies ordered?: NA  Functional Questionnaire: Do you need assistance with bathing/showering or dressing?: Yes Do you need assistance with meal preparation?: Yes Do you need assistance with eating?: No Do you have difficulty maintaining continence: Yes Do you need assistance with getting out of bed/getting out of a chair/moving?: No Do you have difficulty managing or taking your medications?: Yes  Follow up appointments reviewed: PCP Follow-up appointment confirmed?: Yes Date of PCP follow-up appointment?: 12/31/23 Follow-up Provider: [facility] Follow-up appointment confirmed?: NA Do you need transportation to your follow-up appointment?: No Do you understand care options if your condition(s) worsen?: Yes-patient verbalized understanding    SIGNATURE Julian Lemmings, LPN [facility] Nurse Health Advisor Direct Dial [phone]

## 2023-12-31 ENCOUNTER — Ambulatory Visit: Admitting: Family Medicine

## 2023-12-31 ENCOUNTER — Encounter: Payer: Self-pay | Admitting: Family Medicine

## 2023-12-31 VITALS — BP 140/72 | HR 85 | Temp 97.7°F

## 2023-12-31 DIAGNOSIS — S32010A Wedge compression fracture of first lumbar vertebra, initial encounter for closed fracture: Secondary | ICD-10-CM | POA: Diagnosis not present

## 2023-12-31 DIAGNOSIS — Z780 Asymptomatic menopausal state: Secondary | ICD-10-CM

## 2023-12-31 DIAGNOSIS — M8000XA Age-related osteoporosis with current pathological fracture, unspecified site, initial encounter for fracture: Secondary | ICD-10-CM | POA: Diagnosis not present

## 2023-12-31 DIAGNOSIS — F419 Anxiety disorder, unspecified: Secondary | ICD-10-CM

## 2023-12-31 MED ORDER — SERTRALINE HCL 50 MG PO TABS: 50.0000 mg | ORAL_TABLET | Freq: Every day | ORAL | 1 refills | Status: AC

## 2023-12-31 MED ORDER — HYDROXYZINE HCL 25 MG PO TABS: 25.0000 mg | ORAL_TABLET | Freq: Three times a day (TID) | ORAL | 1 refills | Status: AC | PRN

## 2023-12-31 NOTE — Patient Instructions (Signed)
 1200 mg calcium  per day  1000 units of vitamin D  daily

## 2023-12-31 NOTE — Progress Notes (Signed)
 Established Patient Office Visit  Subjective   Patient ID: Victoria Long, female    DOB: [birth year]  Age: 89 y.o. MRN: [id number]  Chief Complaint  Patient presents with   Hospitalization Follow-up    HPI Discussed the use of AI scribe software for clinical note transcription with the patient, who gave verbal consent to proceed.  History of Present Illness   Victoria Long is a 89 year old female who presents for follow-up after kyphoplasty and rehabilitation.  She was admitted on November 17 for an L1 compression fracture treated with kyphoplasty, followed by skilled nursing rehabilitation, and is now in assisted living. She has no current lower back pain and uses Tylenol  as needed.  During rehab she developed anxiety and was started on daily hydroxyzine in addition to sertraline twice daily. She feels weak and needs help with activities of daily living such as showering.  She has osteoporosis and is taking a multivitamin and calcium . Her last DEXA scan was 14 years ago.  She has a good appetite, had a brief episode of constipation, and notes ankle swelling without significant pain.  She had a skin tear on her hand during an episode of confusion in rehab and is receiving regular dressing changes.       Current Outpatient Medications  Medication Instructions   acetaminophen  (TYLENOL ) 650 mg, Oral, Every 6 hours   ARTIFICIAL TEARS 1 % ophthalmic solution 1 drop, 3 times daily PRN   calcium  carbonate (OS-CAL) 600 mg, Daily with breakfast   DEBROX 6.5 % OTIC solution 5 drops, 2 times daily PRN   diclofenac  Sodium (VOLTAREN ) 4 g, Topical, 4 times daily, To both knees   famotidine  (PEPCID ) 20 MG tablet TAKE 1 TABLET BY MOUTH EVERY NIGHT AT BEDTIME   hydrocortisone  (ANUSOL -HC) 2.5 % rectal cream 1 Application, Rectal, 2 times daily   hydrOXYzine (ATARAX) 25 mg, Oral, 3 times daily PRN   LYSINE HCL PO 500 mg, Daily   MiraLax  8.5 g, See admin instructions   Multiple  Vitamins-Minerals (MULTIVITAMIN PO) 1 tablet, Daily with breakfast   olmesartan  (BENICAR ) 20 mg, Oral, Daily   senna-docusate (SENOKOT-S) 8.6-50 MG tablet 1 tablet, Oral, 2 times daily between meals PRN   sertraline (ZOLOFT) 50 mg, Oral, Daily    Patient Active Problem List   Diagnosis Date Noted   Protein-calorie malnutrition, severe 12/10/2023   Closed compression fracture of thoracolumbar vertebra (HCC) 12/08/2023   Normocytic anemia 12/08/2023   External hemorrhoids 06/14/2020   Sigmoid diverticulosis 05/02/2020   Senile purpura 01/11/2019   Unilateral primary osteoarthritis, right knee 08/13/2018   Atherosclerosis of aorta 05/28/2018   Malignant neoplasm of upper-outer quadrant of left breast in female, estrogen receptor positive (HCC) 07/23/2017   Melanoma in situ of upper arm, right (HCC) 07/23/2017   At high risk for falls 07/23/2017   Invasive ductal carcinoma of breast, female, left (HCC) 07/15/2017   Essential hypertension 10/01/2016   Pancreatic mass 08/22/2016   History of right breast cancer 10/25/2013   Encounter for long-term (current) use of medications 04/19/2013   Hyperlipidemia    Abnormal glucose    Vitamin D  deficiency      Review of Systems  All other systems reviewed and are negative.     Objective:     BP (!) 140/72   Pulse 85   Temp 97.7 F (36.5 C) (Oral)   SpO2 97%    Physical Exam Vitals reviewed.  Constitutional:      Appearance: Normal  appearance. She is normal weight.  Cardiovascular:     Rate and Rhythm: Normal rate and regular rhythm.     Pulses: Normal pulses.  Pulmonary:     Effort: Pulmonary effort is normal.     Breath sounds: Normal breath sounds. No wheezing.  Chest:     Chest wall: No tenderness.  Abdominal:     General: Bowel sounds are normal.  Neurological:     Mental Status: She is alert and oriented to person, place, and time. Mental status is at baseline.  Psychiatric:        Mood and Affect: Mood normal.         Behavior: Behavior normal.      No results found for any visits on 12/31/23.    The ASCVD Risk score (Arnett DK, et al., 2019) failed to calculate for the following reasons:   The 2019 ASCVD risk score is only valid for ages 75 to 32    Assessment & Plan:  Closed compression fracture of body of L1 vertebra (HCC) -     Ambulatory referral to Physical Therapy  Postmenopausal state -     DG Bone Density; Future  Age-related osteoporosis with current pathological fracture, initial encounter  Anxiety -     Sertraline HCl; Take 1 tablet (50 mg total) by mouth daily.  Dispense: 90 tablet; Refill: 1 -     hydrOXYzine HCl; Take 1 tablet (25 mg total) by mouth 3 (three) times daily as needed for anxiety.  Dispense: 90 tablet; Refill: 1   Assessment and Plan    Age-related osteoporosis with pathological vertebral fracture L1 compression fracture treated with kyphoplasty. No current pain in the lower back. Suspected osteoporotic fracture due to age and history of compression fractures. Last DEXA scan was 14 years ago. Need to assess current bone density and initiate osteoporosis treatment to prevent future fractures. - Ordered bone density test (DEXA scan) at [facility]. - Will initiate Prolia injections pending insurance approval. - Ensure calcium  intake of 1200 mg per day and vitamin D  intake of 1000 units per day. - Continue multivitamin supplementation.  Anxiety disorder Anxiety episodes during recent hospitalization and transition to new living environment. Currently on sertraline and hydroxyzine. Hydroxyzine causing drowsiness, recommended to take at night. Sertraline expected to take 4-6 weeks to show full effect. - Continue sertraline 50 mg daily. - Adjusted hydroxyzine to be taken at night to aid sleep and reduce anxiety. - Provided prescription refills for sertraline and hydroxyzine.  Skin tear of hand Skin tear on hand occurred during hospitalization. Currently  healing with no need for stitches. - Continue to change dressing every other day with soap and water. - Applied tape to secure dressing and prevent further tearing.        Return in about 6 months (around 06/30/2024).    Victoria CHRISTELLA Sharper, MD

## 2024-01-01 ENCOUNTER — Other Ambulatory Visit (HOSPITAL_COMMUNITY): Payer: Self-pay

## 2024-01-01 ENCOUNTER — Telehealth: Payer: Self-pay

## 2024-01-01 ENCOUNTER — Other Ambulatory Visit: Payer: Self-pay

## 2024-01-01 DIAGNOSIS — M8000XA Age-related osteoporosis with current pathological fracture, unspecified site, initial encounter for fracture: Secondary | ICD-10-CM

## 2024-01-01 MED ORDER — DENOSUMAB 60 MG/ML ~~LOC~~ SOSY
60.0000 mg | PREFILLED_SYRINGE | Freq: Once | SUBCUTANEOUS | Status: AC
  Administered 2024-02-04: 60 mg via SUBCUTANEOUS

## 2024-01-01 NOTE — Telephone Encounter (Signed)
 MEDICAL PA SUBMITTED VIA LATENT. KEY: [id number]   APPROVED-

## 2024-01-01 NOTE — Telephone Encounter (Signed)
 Prolia  VOB initiated via [email or web address]  Next Prolia  inj DUE: NEW START

## 2024-01-01 NOTE — Telephone Encounter (Signed)
 SABRA

## 2024-01-01 NOTE — Progress Notes (Signed)
Sending for PA.

## 2024-01-01 NOTE — Telephone Encounter (Signed)
 PHARMACY COPAY: $64

## 2024-01-02 ENCOUNTER — Ambulatory Visit
Admission: RE | Admit: 2024-01-02 | Discharge: 2024-01-02 | Disposition: A | Source: Ambulatory Visit | Attending: Family Medicine

## 2024-01-02 DIAGNOSIS — Z780 Asymptomatic menopausal state: Secondary | ICD-10-CM

## 2024-01-05 NOTE — Telephone Encounter (Signed)
 Pt ready for scheduling for PROLIA  on or after : 01/05/24  Option# 1: Buy/Bill (Office supplied medication)  Out-of-pocket cost due at time of clinic visit: $40  Number of injection/visits approved: 2  Primary: [employer] Prolia  co-insurance: 0% Admin fee co-insurance: $40  Secondary: --- Prolia  co-insurance:  Admin fee co-insurance:   Medical Benefit Details: Date Benefits were checked: 01/01/24 Deductible: NO/ Coinsurance: 0%/ Admin Fee: $40  Prior Auth: APPROVED PA# [id number] Expiration Date: 01/01/24-01/20/25  # of doses approved: 2 ----------------------------------------------------------------------- Option# 2- Med Obtained from pharmacy:  Pharmacy benefit: Copay $64 (Paid to pharmacy) Admin Fee: $40 (Pay at clinic)  Prior Auth: N/A PA# Expiration Date:   # of doses approved:   If patient wants fill through the pharmacy benefit please send prescription to: [facility], and include estimated need by date in rx notes. Pharmacy will ship medication directly to the office.  Patient NOT eligible for Prolia  Copay Card. Copay Card can make patient's cost as little as $25. Link to apply: [email or web address]://www.[email or web address].[email or web address]/copay  ** This summary of benefits is an estimation of the patient's out-of-pocket cost. Exact cost may very based on individual plan coverage.

## 2024-01-06 ENCOUNTER — Ambulatory Visit: Payer: Self-pay | Admitting: Family Medicine

## 2024-01-07 ENCOUNTER — Encounter: Payer: Self-pay | Admitting: Family Medicine

## 2024-01-07 NOTE — Telephone Encounter (Signed)
 Can we follow up on the referral? It was sent in on 12/10 when she saw me-- it has to be sent to [facility] and I put that in the comments when I ordered it. Yes its ok for her to take both of those at night together.

## 2024-01-13 ENCOUNTER — Inpatient Hospital Stay (HOSPITAL_COMMUNITY)
Admission: EM | Admit: 2024-01-13 | Discharge: 2024-01-21 | DRG: 552 | Disposition: A | Discharge status: Rehabilitation Facility | Attending: Internal Medicine | Admitting: Internal Medicine

## 2024-01-13 ENCOUNTER — Emergency Department (HOSPITAL_COMMUNITY)

## 2024-01-13 ENCOUNTER — Encounter (HOSPITAL_COMMUNITY): Payer: Self-pay | Admitting: Radiology

## 2024-01-13 ENCOUNTER — Other Ambulatory Visit: Payer: Self-pay

## 2024-01-13 DIAGNOSIS — S32030A Wedge compression fracture of third lumbar vertebra, initial encounter for closed fracture: Principal | ICD-10-CM | POA: Diagnosis present

## 2024-01-13 DIAGNOSIS — D638 Anemia in other chronic diseases classified elsewhere: Secondary | ICD-10-CM | POA: Diagnosis present

## 2024-01-13 DIAGNOSIS — E785 Hyperlipidemia, unspecified: Secondary | ICD-10-CM | POA: Diagnosis present

## 2024-01-13 DIAGNOSIS — R54 Age-related physical debility: Secondary | ICD-10-CM | POA: Diagnosis present

## 2024-01-13 DIAGNOSIS — Z833 Family history of diabetes mellitus: Secondary | ICD-10-CM

## 2024-01-13 DIAGNOSIS — S32039A Unspecified fracture of third lumbar vertebra, initial encounter for closed fracture: Principal | ICD-10-CM | POA: Diagnosis present

## 2024-01-13 DIAGNOSIS — Z9841 Cataract extraction status, right eye: Secondary | ICD-10-CM

## 2024-01-13 DIAGNOSIS — Z8261 Family history of arthritis: Secondary | ICD-10-CM

## 2024-01-13 DIAGNOSIS — Z87891 Personal history of nicotine dependence: Secondary | ICD-10-CM

## 2024-01-13 DIAGNOSIS — M170 Bilateral primary osteoarthritis of knee: Secondary | ICD-10-CM | POA: Diagnosis present

## 2024-01-13 DIAGNOSIS — Z8249 Family history of ischemic heart disease and other diseases of the circulatory system: Secondary | ICD-10-CM

## 2024-01-13 DIAGNOSIS — Z9842 Cataract extraction status, left eye: Secondary | ICD-10-CM

## 2024-01-13 DIAGNOSIS — Z79899 Other long term (current) drug therapy: Secondary | ICD-10-CM

## 2024-01-13 DIAGNOSIS — Z23 Encounter for immunization: Secondary | ICD-10-CM

## 2024-01-13 DIAGNOSIS — W010XXA Fall on same level from slipping, tripping and stumbling without subsequent striking against object, initial encounter: Secondary | ICD-10-CM | POA: Diagnosis present

## 2024-01-13 DIAGNOSIS — Z853 Personal history of malignant neoplasm of breast: Secondary | ICD-10-CM

## 2024-01-13 DIAGNOSIS — S51819A Laceration without foreign body of unspecified forearm, initial encounter: Secondary | ICD-10-CM | POA: Diagnosis present

## 2024-01-13 DIAGNOSIS — G8929 Other chronic pain: Secondary | ICD-10-CM | POA: Diagnosis present

## 2024-01-13 DIAGNOSIS — F419 Anxiety disorder, unspecified: Secondary | ICD-10-CM | POA: Diagnosis present

## 2024-01-13 DIAGNOSIS — Z66 Do not resuscitate: Secondary | ICD-10-CM | POA: Diagnosis present

## 2024-01-13 DIAGNOSIS — Z85820 Personal history of malignant melanoma of skin: Secondary | ICD-10-CM

## 2024-01-13 DIAGNOSIS — K2100 Gastro-esophageal reflux disease with esophagitis, without bleeding: Secondary | ICD-10-CM | POA: Diagnosis present

## 2024-01-13 DIAGNOSIS — F32A Depression, unspecified: Secondary | ICD-10-CM | POA: Diagnosis present

## 2024-01-13 DIAGNOSIS — I10 Essential (primary) hypertension: Secondary | ICD-10-CM | POA: Diagnosis present

## 2024-01-13 DIAGNOSIS — Z923 Personal history of irradiation: Secondary | ICD-10-CM

## 2024-01-13 LAB — BASIC METABOLIC PANEL WITH GFR
Anion gap: 12 (ref 5–15)
BUN: 14 mg/dL (ref 8–23)
CO2: 25 mmol/L (ref 22–32)
Calcium: 10 mg/dL (ref 8.9–10.3)
Chloride: 104 mmol/L (ref 98–111)
Creatinine, Ser: 0.54 mg/dL (ref 0.44–1.00)
GFR, Estimated: >60 mL/min
Glucose, Bld: 102 mg/dL — ABNORMAL HIGH (ref 70–99)
Potassium: 3.9 mmol/L (ref 3.5–5.1)
Sodium: 141 mmol/L (ref 135–145)

## 2024-01-13 LAB — CBC
HCT: 38.9 % (ref 36.0–46.0)
Hemoglobin: 13 g/dL (ref 12.0–15.0)
MCH: 32.6 pg (ref 26.0–34.0)
MCHC: 33.4 g/dL (ref 30.0–36.0)
MCV: 97.5 fL (ref 80.0–100.0)
Platelets: 290 K/uL (ref 150–400)
RBC: 3.99 MIL/uL (ref 3.87–5.11)
RDW: 15.4 % (ref 11.5–15.5)
WBC: 10.3 K/uL (ref 4.0–10.5)
nRBC: 0 % (ref 0.0–0.2)

## 2024-01-13 MED ORDER — OXYCODONE HCL 5 MG PO TABS: 5.0000 mg | ORAL_TABLET | ORAL | Status: DC | PRN

## 2024-01-13 MED ORDER — ACETAMINOPHEN 325 MG PO TABS
650.0000 mg | ORAL_TABLET | Freq: Once | ORAL | Status: AC
  Administered 2024-01-13: 650 mg via ORAL
  Filled 2024-01-13: qty 2

## 2024-01-13 MED ORDER — HYDROXYZINE HCL 25 MG PO TABS
25.0000 mg | ORAL_TABLET | Freq: Every day | ORAL | Status: DC
  Administered 2024-01-13 – 2024-01-20 (×7): 25 mg via ORAL
  Filled 2024-01-13 (×6): qty 1

## 2024-01-13 MED ORDER — SERTRALINE HCL 50 MG PO TABS
50.0000 mg | ORAL_TABLET | Freq: Every day | ORAL | Status: DC
  Administered 2024-01-14 – 2024-01-21 (×8): 50 mg via ORAL
  Filled 2024-01-13 (×5): qty 1

## 2024-01-13 MED ORDER — TRAZODONE HCL 50 MG PO TABS
25.0000 mg | ORAL_TABLET | Freq: Every evening | ORAL | Status: DC | PRN
  Administered 2024-01-16 – 2024-01-19 (×4): 25 mg via ORAL
  Filled 2024-01-13 (×2): qty 1

## 2024-01-13 MED ORDER — POLYVINYL ALCOHOL 1.4 % OP SOLN: 1.0000 [drp] | Freq: Three times a day (TID) | OPHTHALMIC | Status: DC | PRN

## 2024-01-13 MED ORDER — ACETAMINOPHEN 325 MG PO TABS
650.0000 mg | ORAL_TABLET | Freq: Four times a day (QID) | ORAL | Status: DC | PRN
  Administered 2024-01-13 (×2): 650 mg via ORAL
  Filled 2024-01-13 (×2): qty 2

## 2024-01-13 MED ORDER — TETANUS-DIPHTH-ACELL PERTUSSIS 5-2-15.5 LF-MCG/0.5 IM SUSP
0.5000 mL | Freq: Once | INTRAMUSCULAR | Status: AC
  Administered 2024-01-13: 0.5 mL via INTRAMUSCULAR
  Filled 2024-01-13: qty 0.5

## 2024-01-13 MED ORDER — LIDOCAINE 5 % EX PTCH
1.0000 | MEDICATED_PATCH | Freq: Once | CUTANEOUS | Status: AC
  Administered 2024-01-13: 1 via TRANSDERMAL
  Filled 2024-01-13: qty 1

## 2024-01-13 MED ORDER — LIDOCAINE-EPINEPHRINE (PF) 2 %-1:200000 IJ SOLN
10.0000 mL | Freq: Once | INTRAMUSCULAR | Status: AC
  Administered 2024-01-13: 10 mL
  Filled 2024-01-13: qty 20

## 2024-01-13 MED ORDER — ONDANSETRON HCL 4 MG PO TABS: 4.0000 mg | ORAL_TABLET | Freq: Four times a day (QID) | ORAL | Status: DC | PRN

## 2024-01-13 MED ORDER — FAMOTIDINE 20 MG PO TABS
20.0000 mg | ORAL_TABLET | Freq: Every day | ORAL | Status: DC
  Administered 2024-01-13 – 2024-01-20 (×7): 20 mg via ORAL
  Filled 2024-01-13 (×6): qty 1

## 2024-01-13 MED ORDER — FENTANYL CITRATE (PF) 50 MCG/ML IJ SOSY: 12.5000 ug | PREFILLED_SYRINGE | INTRAMUSCULAR | Status: DC | PRN

## 2024-01-13 MED ORDER — HYDROXYZINE HCL 25 MG PO TABS: 25.0000 mg | ORAL_TABLET | Freq: Three times a day (TID) | ORAL | Status: DC | PRN

## 2024-01-13 MED ORDER — IRBESARTAN 150 MG PO TABS
150.0000 mg | ORAL_TABLET | Freq: Every day | ORAL | Status: DC
  Administered 2024-01-13 – 2024-01-21 (×9): 150 mg via ORAL
  Filled 2024-01-13 (×7): qty 1

## 2024-01-13 MED ORDER — ACETAMINOPHEN 650 MG RE SUPP: 650.0000 mg | Freq: Four times a day (QID) | RECTAL | Status: DC | PRN

## 2024-01-13 MED ORDER — ENOXAPARIN SODIUM 30 MG/0.3ML IJ SOSY
30.0000 mg | PREFILLED_SYRINGE | INTRAMUSCULAR | Status: DC
  Administered 2024-01-13 – 2024-01-20 (×7): 30 mg via SUBCUTANEOUS
  Filled 2024-01-13 (×6): qty 0.3

## 2024-01-13 MED ORDER — CARBAMIDE PEROXIDE 6.5 % OT SOLN: 5.0000 [drp] | Freq: Two times a day (BID) | OTIC | Status: DC | PRN

## 2024-01-13 MED ORDER — ONDANSETRON HCL 4 MG/2ML IJ SOLN: 4.0000 mg | Freq: Four times a day (QID) | INTRAMUSCULAR | Status: DC | PRN

## 2024-01-13 MED ORDER — ALBUTEROL SULFATE (2.5 MG/3ML) 0.083% IN NEBU: 2.5000 mg | INHALATION_SOLUTION | RESPIRATORY_TRACT | Status: DC | PRN

## 2024-01-13 NOTE — Progress Notes (Addendum)
 89 y/o F with recent L1 kyphoplasty who presents after fall. CT shows an L3 anterior inferior chip fracture. This is a different morphology than original L1 fracture which prompted a kyphoplasty  Recommendations: No bracing or intervention required. Patient can take home a TLSO brace for comfort if she wants, but does not need to have any strict spinal precautions Follow up prn

## 2024-01-13 NOTE — ED Provider Notes (Signed)
 " [place] EMERGENCY DEPARTMENT AT [facility] Provider Note   CSN: [id number] Arrival date & time: 01/13/24  9145     Patient presents with: Victoria Long is a 89 y.o. female.   89 year old female presenting emergency department after a fall from assisted living facility.  She reports a walker slipped away from her.  Did not hit her head or LOC.  Not taking blood thinners.  Has had some recent back pain, but worsened currently.  Unsure when her last tetanus was.   Fall       Prior to Admission medications  Medication Sig Start Date End Date Taking? Authorizing Provider  acetaminophen  (TYLENOL ) 325 MG tablet Take 2 tablets (650 mg total) by mouth every 6 (six) hours. 12/12/23   Gonfa, Taye T, MD  ARTIFICIAL TEARS 1 % ophthalmic solution Place 1 drop into both eyes 3 (three) times daily as needed (for dryness).    [provider]  calcium  carbonate (OS-CAL) 600 MG TABS Take 600 mg by mouth daily with breakfast.    [provider]  DEBROX 6.5 % OTIC solution Place 5 drops into both ears 2 (two) times daily as needed (as directed to clear the ear canals).    [provider]  diclofenac  Sodium (VOLTAREN ) 1 % GEL Apply 4 g topically 4 (four) times daily. To both knees 12/12/23   Kathrin Mignon DASEN, MD  famotidine  (PEPCID ) 20 MG tablet TAKE 1 TABLET BY MOUTH EVERY NIGHT AT BEDTIME 11/25/23   Ozell Heron HERO, MD  hydrocortisone  (ANUSOL -HC) 2.5 % rectal cream Place 1 Application rectally 2 (two) times daily. 06/18/23   Ozell Heron HERO, MD  hydrOXYzine  (ATARAX ) 25 MG tablet Take 1 tablet (25 mg total) by mouth 3 (three) times daily as needed for anxiety. 12/31/23   Ozell Heron HERO, MD  LYSINE HCL PO Take 500 mg by mouth daily.     [provider]  MIRALAX  17 GM/SCOOP powder Take 8.5 g by mouth See admin instructions. Dissolve 8.5 grams into 4-8 ounces of coffee and drink by mouth in the morning    [provider]   Multiple Vitamins-Minerals (MULTIVITAMIN PO) Take 1 tablet by mouth daily with breakfast.    [provider]  olmesartan  (BENICAR ) 20 MG tablet Take 1 tablet (20 mg total) by mouth daily. 11/25/23   Ozell Heron HERO, MD  senna-docusate (SENOKOT-S) 8.6-50 MG tablet Take 1 tablet by mouth 2 (two) times daily between meals as needed for mild constipation. 12/12/23   Gonfa, Taye T, MD  sertraline  (ZOLOFT ) 50 MG tablet Take 1 tablet (50 mg total) by mouth daily. 12/31/23   Ozell Heron HERO, MD    Allergies: Anastrozole  and Tramadol     Review of Systems  Updated Vital Signs BP (!) 144/108 (BP Location: Left Arm)   Pulse 78   Temp 98.4 F (36.9 C)   Resp 18   SpO2 100%   Physical Exam Vitals and nursing note reviewed.  Constitutional:      General: She is not in acute distress.    Appearance: She is not toxic-appearing.  HENT:     Head: Normocephalic.     Nose: Nose normal.     Mouth/Throat:     Mouth: Mucous membranes are moist.  Eyes:     Conjunctiva/sclera: Conjunctivae normal.  Cardiovascular:     Rate and Rhythm: Normal rate and regular rhythm.  Pulmonary:     Effort: Pulmonary effort is normal.  Breath sounds: Normal breath sounds.  Abdominal:     General: Abdomen is flat. There is no distension.     Tenderness: There is no abdominal tenderness. There is no guarding or rebound.  Musculoskeletal:     Comments: Laceration to her right forearm.  Chest wall stable nontender.  Pelvis stable, some tenderness to her left hip.  No bony tenderness to extremities.  5 out of 5 grip strength bilaterally 5-5 plantarflexion dorsiflexion.  Skin:    Capillary Refill: Capillary refill takes less than 2 seconds.  Neurological:     Mental Status: She is alert and oriented to person, place, and time.  Psychiatric:        Mood and Affect: Mood normal.        Behavior: Behavior normal.     (all labs ordered are listed, but only abnormal results are displayed) Labs  Reviewed  BASIC METABOLIC PANEL WITH GFR - Abnormal; Notable for the following components:      Result Value   Glucose, Bld 102 (*)    All other components within normal limits  CBC    EKG: None  Radiology: CT PELVIS WO CONTRAST Result Date: 01/13/2024 CLINICAL DATA:  Fall.  Hip pain.  Suspected hip or pelvic fracture. EXAM: CT PELVIS WITHOUT CONTRAST TECHNIQUE: Multidetector CT imaging of the pelvis was performed following the standard protocol without intravenous contrast. RADIATION DOSE REDUCTION: This exam was performed according to the departmental dose-optimization program which includes automated exposure control, adjustment of the mA and/or kV according to patient size and/or use of iterative reconstruction technique. COMPARISON:  None Available. FINDINGS: Lower Urinary Tract: Unremarkable urinary bladder. Bowel: Sigmoid diverticulosis noted, without signs of diverticulitis. Vascular/Lymphatic: No pathologically enlarged lymph nodes or other significant abnormality. Reproductive: 2 cm calcified pedunculated uterine fibroid seen arising from the left anterior fundus. Small amount of air seen in endometrial cavity, of uncertain etiology. No signs of inflammatory process or abnormal fluid collections. Other: None. Musculoskeletal: Generalized osteopenia noted. No evidence of hip fracture or dislocation. No pelvic fractures identified. Moderate bilateral hip osteoarthritis noted. No suspicious bone lesions identified. IMPRESSION: No evidence of hip or pelvic fracture. Moderate bilateral hip osteoarthritis. Small calcified uterine fibroid. Small amount of air in endometrial cavity, of uncertain etiology. Sigmoid diverticulosis, without radiographic evidence of diverticulitis. Electronically Signed   By: Norleen DELENA Kil M.D.   On: 01/13/2024 11:36   DG Forearm Right Result Date: 01/13/2024 EXAM: VIEW(S) XRAY OF THE RIGHT FOREARM 01/13/2024 09:55:00 AM COMPARISON: None available. CLINICAL HISTORY:  89 year old female with laceration, status post fall from standing. FINDINGS: BONES AND JOINTS: Degenerative changes of the wrist. SOFT TISSUES: Bandage material overlies the mid forearm. IMPRESSION: 1. No acute fracture or dislocation identified about the right forearm. Electronically signed by: Helayne Hurst MD 01/13/2024 10:42 AM EST RP Workstation: [id number]   CT Lumbar Spine Wo Contrast Result Date: 01/13/2024 EXAM: CT OF THE LUMBAR SPINE WITHOUT CONTRAST 01/13/2024 10:14:00 AM TECHNIQUE: CT of the lumbar spine was performed without the administration of intravenous contrast. Multiplanar reformatted images are provided for review. Automated exposure control, iterative reconstruction, and/or weight based adjustment of the mA/kV was utilized to reduce the radiation dose to as low as reasonably achievable. COMPARISON: Lumbar spine CT 12/04/2023. CLINICAL HISTORY: 89 year old female. Back trauma, no prior imaging. Status post fall from standing. Status post L1 compression fracture augmentation. FINDINGS: BONES AND ALIGNMENT: Normal lumbar segmentation as previously designated. Generalized osteopenia. T12 vertebrae appears stable and intact. Interval L1 vertebral body augmentation. Mild retropulsion  there is new from November. But no other complicating features. L2 is stable and intact. Subtle new L3 compression fracture since November with 12% loss of vertebral body height. Mild anterior buckling of the L3 vertebral body on series 6 image 40 and similar mild cortical disruption on coronal image 29. L3 pedicles and posterior elements appear intact and aligned. No retropulsion. L4 and L5 appears stable and intact. Visible sacrum and SI joints appears stable and intact. SOFT TISSUES: Chronically abnormal pancreatic tail parenchyma with combined multicystic and dystrophic calcification appearance on series 8 image 67 is stable from November. The visualized portion of the mass is roughly 7 to 8 cm. This meets  consensus criteria for follow up surgical consultation in > 24 years of age. Advanced calcified aortoiliac atherosclerosis. Partially visible large bowel diverticulosis and retained stool. Negative lumbar paraspinal soft tissues. DEGENERATIVE CHANGES: Chronic lumbar spine degeneration is stable. IMPRESSION: 1. Osteopenia with new L3 compression fracture since last month, most likely acute. 13% loss of height with no retropulsion or complicating features. 2. Abnormal multicystic and dystrophic calcifications replacement of visible pancreas (estimated 7-8 cm). Lesion size meets consensus criteria for surgical consultation in patients > 22 years old. 3. Interval L1 vertebral body augmentation. Electronically signed by: Helayne Hurst MD 01/13/2024 10:38 AM EST RP Workstation: [id number]   CT Cervical Spine Wo Contrast Result Date: 01/13/2024 EXAM: CT CERVICAL SPINE WITHOUT CONTRAST 01/13/2024 10:14:00 AM TECHNIQUE: CT of the cervical spine was performed without the administration of intravenous contrast. Multiplanar reformatted images are provided for review. Automated exposure control, iterative reconstruction, and/or weight based adjustment of the mA/kV was utilized to reduce the radiation dose to as low as reasonably achievable. COMPARISON: CT thoracic spine 12/04/2023. CLINICAL HISTORY: Neck trauma. Fall from standing. FINDINGS: BONES AND ALIGNMENT: Trace anterolisthesis of C7 on T1. No acute fracture or suspicious lesion. DEGENERATIVE CHANGES: Multilevel disc degeneration, most advanced at C4-C5 and C6-C7. Asymmetrically advanced left sided facet arthrosis in the upper cervical spine. Advanced foraminal stenosis on the left at C3-C4 due to facet spurring and on the right at C4-C5 due to uncovertebral spurring. SOFT TISSUES: No prevertebral soft tissue swelling. IMPRESSION: 1. No acute cervical spine fracture or traumatic malalignment. Electronically signed by: Dasie Hamburg MD 01/13/2024 10:31 AM EST RP  Workstation: [id number]   CT Head Wo Contrast Result Date: 01/13/2024 EXAM: CT HEAD WITHOUT CONTRAST 01/13/2024 10:14:00 AM TECHNIQUE: CT of the head was performed without the administration of intravenous contrast. Automated exposure control, iterative reconstruction, and/or weight based adjustment of the mA/kV was utilized to reduce the radiation dose to as low as reasonably achievable. COMPARISON: None available. CLINICAL HISTORY: Head trauma, minor. Fall from standing. FINDINGS: BRAIN AND VENTRICLES: There is no evidence of an acute infarct, intracranial hemorrhage, mass, midline shift, hydrocephalus, or extra-axial fluid collection. Mild cerebral atrophy is within normal limits for age. Cerebral white matter hypodensities are nonspecific but compatible with mild chronic small vessel ischemic disease. Calcified atherosclerosis at the skull base. ORBITS: No acute abnormality. Bilateral cataract extraction. SINUSES: No acute abnormality. SOFT TISSUES AND SKULL: No acute soft tissue abnormality. No skull fracture. IMPRESSION: 1. No acute intracranial abnormality. 2. Mild chronic small vessel ischemic disease. Electronically signed by: Dasie Hamburg MD 01/13/2024 10:23 AM EST RP Workstation: [id number]     .Laceration Repair  Date/Time: 01/13/2024 3:15 PM  Performed by: Neysa Caron PARAS, DO Authorized by: Neysa Caron PARAS, DO   Consent:    Consent obtained:  Verbal   Consent given by:  Patient  Risks discussed:  Infection, pain, poor cosmetic result, retained foreign body and poor wound healing Universal protocol:    Procedure explained and questions answered to patient or proxy's satisfaction: yes     Patient identity confirmed:  Verbally with patient Anesthesia:    Anesthesia method:  Local infiltration   Local anesthetic:  Lidocaine  2% WITH epi Laceration details:    Location:  Shoulder/arm   Shoulder/arm location:  R lower arm   Length (cm):  2.5 Pre-procedure details:    Preparation:   Patient was prepped and draped in usual sterile fashion Exploration:    Imaging obtained: x-ray     Imaging outcome: foreign body not noted     Wound extent: no foreign body   Treatment:    Area cleansed with:  Saline   Amount of cleaning:  Standard Skin repair:    Repair method:  Sutures   Suture size:  5-0   Suture technique:  Simple interrupted   Number of sutures:  6 Repair type:    Repair type:  Simple Post-procedure details:    Dressing:  Non-adherent dressing   Procedure completion:  Tolerated    Medications Ordered in the ED  lidocaine  (LIDODERM ) 5 % 1 patch (1 patch Transdermal Patch Applied 01/13/24 1307)  irbesartan  (AVAPRO ) tablet 150 mg (has no administration in time range)  sertraline  (ZOLOFT ) tablet 50 mg (has no administration in time range)  famotidine  (PEPCID ) tablet 20 mg (has no administration in time range)  carbamide peroxide (DEBROX) 6.5 % OTIC (EAR) solution 5 drop (has no administration in time range)  artificial tears ophthalmic solution 1 drop (has no administration in time range)  enoxaparin  (LOVENOX ) injection 30 mg (has no administration in time range)  acetaminophen  (TYLENOL ) tablet 650 mg (has no administration in time range)    Or  acetaminophen  (TYLENOL ) suppository 650 mg (has no administration in time range)  traZODone  (DESYREL ) tablet 25 mg (has no administration in time range)  ondansetron  (ZOFRAN ) tablet 4 mg (has no administration in time range)    Or  ondansetron  (ZOFRAN ) injection 4 mg (has no administration in time range)  albuterol  (PROVENTIL ) (2.5 MG/3ML) 0.083% nebulizer solution 2.5 mg (has no administration in time range)  oxyCODONE  (Oxy IR/ROXICODONE ) immediate release tablet 5 mg (has no administration in time range)  fentaNYL  (SUBLIMAZE ) injection 12.5 mcg (has no administration in time range)  hydrOXYzine  (ATARAX ) tablet 25 mg (has no administration in time range)  Tdap (ADACEL ) injection 0.5 mL (0.5 mLs Intramuscular Given  01/13/24 1016)  lidocaine -EPINEPHrine  (XYLOCAINE  W/EPI) 2 %-1:200000 (PF) injection 10 mL (10 mLs Infiltration Given by Other 01/13/24 1259)  acetaminophen  (TYLENOL ) tablet 650 mg (650 mg Oral Given 01/13/24 1307)    Clinical Course as of 01/13/24 1521  Tue Jan 13, 2024  1113 CT Head Wo Contrast IMPRESSION: 1. No acute intracranial abnormality. 2. Mild chronic small vessel ischemic disease.   [TY]  1113 CT Cervical Spine Wo Contrast MPRESSION: 1. No acute cervical spine fracture or traumatic malalignment.   [TY]  1114 CT Lumbar Spine Wo Contrast IMPRESSION: 1. Osteopenia with new L3 compression fracture since last month, most likely acute. 13% loss of height with no retropulsion or complicating features. 2. Abnormal multicystic and dystrophic calcifications replacement of visible pancreas (estimated 7-8 cm). Lesion size meets consensus criteria for surgical consultation in patients > 72 years old. 3. Interval L1 vertebral body augmentation.   [TY]  1114 DG Forearm Right MPRESSION: 1. No acute fracture or dislocation identified about the  right forearm.   [TY]  1115 CT PELVIS WO CONTRAST IMPRESSION: 1. Osteopenia with new L3 compression fracture since last month, most likely acute. 13% loss of height with no retropulsion or complicating features. 2. Abnormal multicystic and dystrophic calcifications replacement of visible pancreas (estimated 7-8 cm). Lesion size meets consensus criteria for surgical consultation in patients > 71 years old. 3. Interval L1 vertebral body augmentation.   [TY]  1302 Spoke with neurosurgery, no indication for intervention at this time.  May consider bracing for comfort, but not necessarily indicated.  Fracture pattern is different than prior and not amenable to kyphoplasty. [TY]    Clinical Course User Index [TY] Neysa Caron PARAS, DO                                 Medical Decision Making 89 year old presenting emergency department for  fall.  Afebrile nontachycardic, normotensive.  Physical exam with laceration to forearm, no other apparent injuries.  Does have some tenderness in her low back.  CT head without acute traumatic injury.  Forearm without fracture.  Lumbar spine with new L3 fracture.  Case discussed with neurosurgery no acute intervention needed; see ED course.  Pelvis without fracture.  Given lidocaine , Tylenol .  Family presented to bedside and noted that she is at her baseline, but they do worry about her at her assisted living facility because she does not have much care and is requesting admission.  Not unreasonable as patient does have pretty severe pain when attempting to move however pain seemingly is controlled while lying still in bed.  Discussed case with hospitalist agrees to admit patient.  Amount and/or Complexity of Data Reviewed External Data Reviewed:     Details: Not on blood thinner Labs: ordered. Radiology: ordered and independent interpretation performed. Decision-making details documented in ED Course.    Details: Do not appreciate obvious intracranial hemorrhage on CT head  Risk OTC drugs. Prescription drug management. Decision regarding hospitalization. Diagnosis or treatment significantly limited by social determinants of health.      Final diagnoses:  Essential hypertension  Anxiety  Gastroesophageal reflux disease with esophagitis without hemorrhage    ED Discharge Orders     None          Neysa Caron PARAS, DO 01/13/24 1521  "

## 2024-01-13 NOTE — Evaluation (Signed)
 Physical Therapy Evaluation Patient Details Name: Victoria Long MRN: [id number] DOB: 03/23/1928 Today's Date: 01/13/2024  History of Present Illness  89 year old presented to [facility] after fall at ILF. CT revealed  L3 compression fx. Pt also with R forearm laceration requiring sutures. Admitted for pain control.  PMH: T6 and L1 compression fractures, S/P L1 kyphoplasty 12/10/23, bilateral knee osteoarthritis, stage Ia bilateral breast cancer s/p lumpectomy and radiations, pancreatic mass, prediabetes, vitamin D  deficiency and hyperlipidemia  Clinical Impression  Pt admitted with above diagnosis.  Pt from [facility], per pt son's she on waiting list for ALF. Pt with recent hospitalization for fall with comp fxs and rehab stay at [facility].  Pt is able to ambulate short distance in room today requiring RW and min assist to balance. Pt will likely need post acute rehab d/t recent falls/impaired balance, generalized weakness and pain.   Patient will benefit from continued inpatient follow up therapy, <3 hours/day. Continue to follow  in acute setting   Pt currently with functional limitations due to the deficits listed below (see PT Problem List). Pt will benefit from acute skilled PT to increase their independence and safety with mobility to allow discharge.           If plan is discharge home, recommend the following: A little help with walking and/or transfers;A little help with bathing/dressing/bathroom;Help with stairs or ramp for entrance;Assist for transportation;Assistance with cooking/housework   Can travel by private vehicle   Yes    Equipment Recommendations None recommended by PT  Recommendations for Other Services       Functional Status Assessment Patient has had a recent decline in their functional status and demonstrates the ability to make significant improvements in function in a reasonable and predictable amount of time.     Precautions / Restrictions  Precautions Precautions: Fall;Back Precaution/Restrictions Comments: back precautions for comfort Restrictions Weight Bearing Restrictions Per Provider Order: No      Mobility  Bed Mobility Overal bed mobility: Needs Assistance Bed Mobility: Rolling, Sidelying to Sit, Sit to Sidelying Rolling: Min assist, Used rails Sidelying to sit: Min assist, Used rails     Sit to sidelying: Min assist General bed mobility comments: verbal and tactile cues for log roll, assist to elevate trunk, proress LEs off bed    Transfers Overall transfer level: Needs assistance Equipment used: Rolling walker (2 wheels) Transfers: Sit to/from Stand Sit to Stand: Min assist           General transfer comment: assist to rise and balance once in standing, cues for safety and control of descent    Ambulation/Gait Ambulation/Gait assistance: Min assist Gait Distance (Feet): 25 Feet Assistive device: Rolling walker (2 wheels) Gait Pattern/deviations: Step-to pattern, Decreased step length - right, Decreased step length - left Gait velocity: decr     General Gait Details: assist to steady and manage RW. cues for proximity to RW, posture. bil LEs shaky/unsteadiness noted however no overt LOB  Stairs            Wheelchair Mobility     Tilt Bed    Modified Rankin (Stroke Patients Only)       Balance Overall balance assessment: Needs assistance, History of Falls Sitting-balance support: Feet supported, No upper extremity supported Sitting balance-Leahy Scale: Fair Sitting balance - Comments: time in sitting limited d/t incr pain reported   Standing balance support: During functional activity, Reliant on assistive device for balance Standing balance-Leahy Scale: Poor  Pertinent Vitals/Pain Pain Assessment Pain Assessment: Faces Faces Pain Scale: Hurts little more Pain Location: back Pain Descriptors / Indicators: Discomfort Pain  Intervention(s): Limited activity within patient's tolerance, Monitored during session, Repositioned, Other (comment) (RN offerred meds, pt declined)    Home Living Family/patient expects to be discharged to:: Skilled nursing facility Living Arrangements: Alone   Type of Home: Independent living facility Home Access: Level entry         Home Equipment: Rollator (4 wheels) Additional Comments: pt resides at [facility], on waiting list for ALF per family    Prior Function               Mobility Comments: uses rollator for ambulation ADLs Comments: requiring assistance per pt sons     Extremity/Trunk Assessment   Upper Extremity Assessment Upper Extremity Assessment: Defer to OT evaluation (AROM grossly WFL)    Lower Extremity Assessment Lower Extremity Assessment: Generalized weakness    Cervical / Trunk Assessment Cervical / Trunk Assessment:  (recent kyphoplasty)  Communication   Communication Communication: Impaired Factors Affecting Communication: Hearing impaired    Cognition Arousal: Alert Behavior During Therapy: Flat affect   PT - Cognitive impairments: Memory                       PT - Cognition Comments: incr time to answer questions at times, pt fuzzy on recent events/timeline. pt son's assist with answering PLOF questions. Following commands: Intact       Cueing Cueing Techniques: Verbal cues, Tactile cues     General Comments      Exercises     Assessment/Plan    PT Assessment Patient needs continued PT services  PT Problem List Decreased strength;Decreased range of motion;Decreased activity tolerance;Decreased balance;Decreased mobility;Decreased knowledge of use of DME;Pain       PT Treatment Interventions DME instruction;Therapeutic activities;Gait training;Functional mobility training;Balance training;Therapeutic exercise;Patient/family education    PT Goals (Current goals can be found in the Care Plan section)   Acute Rehab PT Goals PT Goal Formulation: With patient/family Time For Goal Achievement: 01/27/24 Potential to Achieve Goals: Good    Frequency Min 3X/week     Co-evaluation               AM-PAC PT 6 Clicks Mobility  Outcome Measure Help needed turning from your back to your side while in a flat bed without using bedrails?: A Little Help needed moving from lying on your back to sitting on the side of a flat bed without using bedrails?: A Little Help needed moving to and from a bed to a chair (including a wheelchair)?: A Little Help needed standing up from a chair using your arms (e.g., wheelchair or bedside chair)?: A Little Help needed to walk in hospital room?: A Little Help needed climbing 3-5 steps with a railing? : A Lot 6 Click Score: 17    End of Session Equipment Utilized During Treatment: Gait belt Activity Tolerance: Patient tolerated treatment well Patient left: with call bell/phone within reach;in bed;with bed alarm set;with family/visitor present Nurse Communication: Mobility status PT Visit Diagnosis: Other abnormalities of gait and mobility (R26.89)    Time: 8469-8452 PT Time Calculation (min) (ACUTE ONLY): 17 min   Charges:   PT Evaluation $PT Eval Low Complexity: 1 Low   PT General Charges $$ ACUTE PT VISIT: 1 Visit         Waynetta Metheny, PT  Acute Rehab Dept ([facility]) [phone]  01/13/2024   [facility] 01/13/2024, 4:05  PM

## 2024-01-13 NOTE — H&P (Signed)
 " History and Physical  Victoria Long FMW:[id number] DOB: Jul 26, 1928 DOA: 01/13/2024  PCP: Ozell Heron HERO, MD   Chief Complaint: Fall, back pain  HPI: Victoria Long is a 89 y.o. female with medical history significant for bilateral knee osteoarthritis, treated breast cancer, known pancreatic mass, prediabetes, hyperlipidemia and recent hospital stay for acute L1 compression fracture status post IR kyphoplasty who is being admitted to the hospital with acute L3 anterior inferior chip fracture after mechanical fall today.  Patient states that she was going to her fridge at her independent living, somehow lost her balance and fell/slid to the floor.  Did not hit her head, did not lose consciousness.  Denies any recent illness.  No recent significant medication changes.  She was just discharged from [facility] 11/21 to subacute nursing facility for about 6 days and then return to her independent living.  Review of Systems: Please see HPI for pertinent positives and negatives. A complete 10 system review of systems are otherwise negative.  Past Medical History:  Diagnosis Date   Arthritis    knees   Breast cancer (HCC) 2019   Left Breast   Breast cancer (HCC) 2012   Right Breast   Cancer [facility]) 2012   right breast cancer   Hyperlipidemia    Melanoma (HCC) 2014   right humerus   Personal history of radiation therapy 2012   Right Breast Cancer   Personal history of radiation therapy 2019   Left Breast Cancer   Prediabetes    Vitamin D  deficiency    Past Surgical History:  Procedure Laterality Date   BREAST BIOPSY Right 05/28/2010   BREAST LUMPECTOMY Right 06/29/2010   with radiation no chemo   BREAST LUMPECTOMY Left 08/08/2017   CATARACT EXTRACTION Bilateral 2009   CESAREAN SECTION     IR KYPHO LUMBAR INC FX REDUCE BONE BX UNI/BIL CANNULATION INC/IMAGING  12/10/2023   MASTECTOMY, PARTIAL Left 07/29/2017   Procedure: LEFT BREAST PARTIAL MASTECTOMY;  Surgeon: Vernetta Berg, MD;  Location:  SURGERY CENTER;  Service: General;  Laterality: Left;   MELANOMA EXCISION Right 2014   humerus   Social History:  reports that she quit smoking about 47 years ago. Her smoking use included cigarettes. She started smoking about 57 years ago. She has never used smokeless tobacco. She reports current alcohol  use. She reports that she does not use drugs.  Allergies[1]  Family History  Problem Relation Age of Onset   Arthritis Mother    Rheum arthritis Mother    Hypertension Sister    Cancer Sister        type unknown   Diabetes Maternal Grandmother    Heart attack Brother      Prior to Admission medications  Medication Sig Start Date End Date Taking? Authorizing Provider  acetaminophen  (TYLENOL ) 325 MG tablet Take 2 tablets (650 mg total) by mouth every 6 (six) hours. 12/12/23   Gonfa, Taye T, MD  ARTIFICIAL TEARS 1 % ophthalmic solution Place 1 drop into both eyes 3 (three) times daily as needed (for dryness).    [provider]  calcium  carbonate (OS-CAL) 600 MG TABS Take 600 mg by mouth daily with breakfast.    [provider]  DEBROX 6.5 % OTIC solution Place 5 drops into both ears 2 (two) times daily as needed (as directed to clear the ear canals).    [provider]  diclofenac  Sodium (VOLTAREN ) 1 % GEL Apply 4 g topically 4 (four) times daily. To  both knees 12/12/23   Kathrin Mignon DASEN, MD  famotidine  (PEPCID ) 20 MG tablet TAKE 1 TABLET BY MOUTH EVERY NIGHT AT BEDTIME 11/25/23   Ozell Heron HERO, MD  hydrocortisone  (ANUSOL -HC) 2.5 % rectal cream Place 1 Application rectally 2 (two) times daily. 06/18/23   Ozell Heron HERO, MD  hydrOXYzine  (ATARAX ) 25 MG tablet Take 1 tablet (25 mg total) by mouth 3 (three) times daily as needed for anxiety. 12/31/23   Ozell Heron HERO, MD  LYSINE HCL PO Take 500 mg by mouth daily.     [provider]  MIRALAX  17 GM/SCOOP powder Take 8.5 g by mouth See admin instructions. Dissolve  8.5 grams into 4-8 ounces of coffee and drink by mouth in the morning    [provider]  Multiple Vitamins-Minerals (MULTIVITAMIN PO) Take 1 tablet by mouth daily with breakfast.    [provider]  olmesartan  (BENICAR ) 20 MG tablet Take 1 tablet (20 mg total) by mouth daily. 11/25/23   Ozell Heron HERO, MD  senna-docusate (SENOKOT-S) 8.6-50 MG tablet Take 1 tablet by mouth 2 (two) times daily between meals as needed for mild constipation. 12/12/23   Gonfa, Taye T, MD  sertraline  (ZOLOFT ) 50 MG tablet Take 1 tablet (50 mg total) by mouth daily. 12/31/23   Ozell Heron HERO, MD    Physical Exam: BP (!) 144/108 (BP Location: Left Arm)   Pulse 78   Temp 98.3 F (36.8 C)   Resp 18   SpO2 100%  General:  Alert, oriented, calm, in no acute distress, she looks younger than her stated age.  Resting comfortably on room air with her 2 sons at the bedside. Cardiovascular: RRR, no murmurs or rubs, no peripheral edema  Respiratory: clear to auscultation bilaterally, no wheezes, no crackles  Abdomen: soft, nontender, nondistended, normal bowel tones heard  Skin: dry, no rashes  Musculoskeletal: no joint effusions, normal range of motion  Psychiatric: appropriate affect, normal speech  Neurologic: extraocular muscles intact, clear speech, moving all extremities with intact sensorium         Labs on Admission:  Basic Metabolic Panel: Recent Labs  Lab 01/13/24 1315  NA 141  K 3.9  CL 104  CO2 25  GLUCOSE 102*  BUN 14  CREATININE 0.54  CALCIUM  10.0   Liver Function Tests: No results for input(s): AST, ALT, ALKPHOS, BILITOT, PROT, ALBUMIN in the last 168 hours. No results for input(s): LIPASE, AMYLASE in the last 168 hours. No results for input(s): AMMONIA in the last 168 hours. CBC: Recent Labs  Lab 01/13/24 1315  WBC 10.3  HGB 13.0  HCT 38.9  MCV 97.5  PLT 290   Cardiac Enzymes: No results for input(s): CKTOTAL, CKMB, CKMBINDEX,  TROPONINI in the last 168 hours. BNP (last 3 results) No results for input(s): BNP in the last 8760 hours.  ProBNP (last 3 results) No results for input(s): PROBNP in the last 8760 hours.  CBG: No results for input(s): GLUCAP in the last 168 hours.  Radiological Exams on Admission: CT PELVIS WO CONTRAST Result Date: 01/13/2024 CLINICAL DATA:  Fall.  Hip pain.  Suspected hip or pelvic fracture. EXAM: CT PELVIS WITHOUT CONTRAST TECHNIQUE: Multidetector CT imaging of the pelvis was performed following the standard protocol without intravenous contrast. RADIATION DOSE REDUCTION: This exam was performed according to the departmental dose-optimization program which includes automated exposure control, adjustment of the mA and/or kV according to patient size and/or use of iterative reconstruction technique. COMPARISON:  None Available. FINDINGS:  Lower Urinary Tract: Unremarkable urinary bladder. Bowel: Sigmoid diverticulosis noted, without signs of diverticulitis. Vascular/Lymphatic: No pathologically enlarged lymph nodes or other significant abnormality. Reproductive: 2 cm calcified pedunculated uterine fibroid seen arising from the left anterior fundus. Small amount of air seen in endometrial cavity, of uncertain etiology. No signs of inflammatory process or abnormal fluid collections. Other: None. Musculoskeletal: Generalized osteopenia noted. No evidence of hip fracture or dislocation. No pelvic fractures identified. Moderate bilateral hip osteoarthritis noted. No suspicious bone lesions identified. IMPRESSION: No evidence of hip or pelvic fracture. Moderate bilateral hip osteoarthritis. Small calcified uterine fibroid. Small amount of air in endometrial cavity, of uncertain etiology. Sigmoid diverticulosis, without radiographic evidence of diverticulitis. Electronically Signed   By: Norleen DELENA Kil M.D.   On: 01/13/2024 11:36   DG Forearm Right Result Date: 01/13/2024 EXAM: VIEW(S) XRAY OF THE  RIGHT FOREARM 01/13/2024 09:55:00 AM COMPARISON: None available. CLINICAL HISTORY: 89 year old female with laceration, status post fall from standing. FINDINGS: BONES AND JOINTS: Degenerative changes of the wrist. SOFT TISSUES: Bandage material overlies the mid forearm. IMPRESSION: 1. No acute fracture or dislocation identified about the right forearm. Electronically signed by: Helayne Hurst MD 01/13/2024 10:42 AM EST RP Workstation: [id number]   CT Lumbar Spine Wo Contrast Result Date: 01/13/2024 EXAM: CT OF THE LUMBAR SPINE WITHOUT CONTRAST 01/13/2024 10:14:00 AM TECHNIQUE: CT of the lumbar spine was performed without the administration of intravenous contrast. Multiplanar reformatted images are provided for review. Automated exposure control, iterative reconstruction, and/or weight based adjustment of the mA/kV was utilized to reduce the radiation dose to as low as reasonably achievable. COMPARISON: Lumbar spine CT 12/04/2023. CLINICAL HISTORY: 89 year old female. Back trauma, no prior imaging. Status post fall from standing. Status post L1 compression fracture augmentation. FINDINGS: BONES AND ALIGNMENT: Normal lumbar segmentation as previously designated. Generalized osteopenia. T12 vertebrae appears stable and intact. Interval L1 vertebral body augmentation. Mild retropulsion there is new from November. But no other complicating features. L2 is stable and intact. Subtle new L3 compression fracture since November with 12% loss of vertebral body height. Mild anterior buckling of the L3 vertebral body on series 6 image 40 and similar mild cortical disruption on coronal image 29. L3 pedicles and posterior elements appear intact and aligned. No retropulsion. L4 and L5 appears stable and intact. Visible sacrum and SI joints appears stable and intact. SOFT TISSUES: Chronically abnormal pancreatic tail parenchyma with combined multicystic and dystrophic calcification appearance on series 8 image 67 is stable from  November. The visualized portion of the mass is roughly 7 to 8 cm. This meets consensus criteria for follow up surgical consultation in > 59 years of age. Advanced calcified aortoiliac atherosclerosis. Partially visible large bowel diverticulosis and retained stool. Negative lumbar paraspinal soft tissues. DEGENERATIVE CHANGES: Chronic lumbar spine degeneration is stable. IMPRESSION: 1. Osteopenia with new L3 compression fracture since last month, most likely acute. 13% loss of height with no retropulsion or complicating features. 2. Abnormal multicystic and dystrophic calcifications replacement of visible pancreas (estimated 7-8 cm). Lesion size meets consensus criteria for surgical consultation in patients > 10 years old. 3. Interval L1 vertebral body augmentation. Electronically signed by: Helayne Hurst MD 01/13/2024 10:38 AM EST RP Workstation: [id number]   CT Cervical Spine Wo Contrast Result Date: 01/13/2024 EXAM: CT CERVICAL SPINE WITHOUT CONTRAST 01/13/2024 10:14:00 AM TECHNIQUE: CT of the cervical spine was performed without the administration of intravenous contrast. Multiplanar reformatted images are provided for review. Automated exposure control, iterative reconstruction, and/or weight based adjustment of the mA/kV  was utilized to reduce the radiation dose to as low as reasonably achievable. COMPARISON: CT thoracic spine 12/04/2023. CLINICAL HISTORY: Neck trauma. Fall from standing. FINDINGS: BONES AND ALIGNMENT: Trace anterolisthesis of C7 on T1. No acute fracture or suspicious lesion. DEGENERATIVE CHANGES: Multilevel disc degeneration, most advanced at C4-C5 and C6-C7. Asymmetrically advanced left sided facet arthrosis in the upper cervical spine. Advanced foraminal stenosis on the left at C3-C4 due to facet spurring and on the right at C4-C5 due to uncovertebral spurring. SOFT TISSUES: No prevertebral soft tissue swelling. IMPRESSION: 1. No acute cervical spine fracture or traumatic malalignment.  Electronically signed by: Dasie Hamburg MD 01/13/2024 10:31 AM EST RP Workstation: [id number]   CT Head Wo Contrast Result Date: 01/13/2024 EXAM: CT HEAD WITHOUT CONTRAST 01/13/2024 10:14:00 AM TECHNIQUE: CT of the head was performed without the administration of intravenous contrast. Automated exposure control, iterative reconstruction, and/or weight based adjustment of the mA/kV was utilized to reduce the radiation dose to as low as reasonably achievable. COMPARISON: None available. CLINICAL HISTORY: Head trauma, minor. Fall from standing. FINDINGS: BRAIN AND VENTRICLES: There is no evidence of an acute infarct, intracranial hemorrhage, mass, midline shift, hydrocephalus, or extra-axial fluid collection. Mild cerebral atrophy is within normal limits for age. Cerebral white matter hypodensities are nonspecific but compatible with mild chronic small vessel ischemic disease. Calcified atherosclerosis at the skull base. ORBITS: No acute abnormality. Bilateral cataract extraction. SINUSES: No acute abnormality. SOFT TISSUES AND SKULL: No acute soft tissue abnormality. No skull fracture. IMPRESSION: 1. No acute intracranial abnormality. 2. Mild chronic small vessel ischemic disease. Electronically signed by: Dasie Hamburg MD 01/13/2024 10:23 AM EST RP Workstation: [id number]   Assessment/Plan  Inocente Small Aybar is a 89 y.o. female with medical history significant for bilateral knee osteoarthritis, treated breast cancer, known pancreatic mass, prediabetes, hyperlipidemia and recent hospital stay for acute L1 compression fracture status post IR kyphoplasty who is being admitted to the hospital with acute L3 anterior inferior chip fracture after mechanical fall today.  L3 anterior-inferior compression fracture-due to mechanical fall earlier today.  CT scans without evidence of other acute injury or findings.  He had provider discussed with neurosurgery, who recommends symptomatic management, no  kyphoplasty. -Observation admission -Pain and nausea control as needed -PT/OT consult -Could consider TLSO brace if needed for comfort, but not required per neurosurgery  Depression/anxiety-continue home Zoloft   GERD-Pepcid   Hypertension-continue hospital equivalent of home dose arb  DVT prophylaxis: Lovenox      Code Status: Limited: Do not attempt resuscitation (DNR) -DNR-LIMITED -Do Not Intubate/DNI   Consults called: None  Admission status: Observation  Time spent: 42 minutes  Demyan Fugate CHRISTELLA Gail MD [facility] Pager [phone]  If 7PM-7AM, please contact night-coverage [email or web address] Password [email or web address]  01/13/2024, 2:17 PM      [1]  Allergies Allergen Reactions   Anastrozole  Other (See Comments)    Made the patient feel loopy   Tramadol  Other (See Comments)    Medication caused dizziness   "

## 2024-01-13 NOTE — ED Triage Notes (Signed)
 Pt arrives via [facility] from [facility] at [facility] for a fall from standing. Pt states that she was using her walker and slipped. EMS reports approximately 2 laceration to R forearm. Denies head injury/LOC, no blood thinners. Pt also c/o worsening lower back pain.

## 2024-01-13 NOTE — Plan of Care (Signed)

## 2024-01-14 DIAGNOSIS — F32A Depression, unspecified: Secondary | ICD-10-CM | POA: Diagnosis present

## 2024-01-14 DIAGNOSIS — Z833 Family history of diabetes mellitus: Secondary | ICD-10-CM | POA: Diagnosis not present

## 2024-01-14 DIAGNOSIS — M170 Bilateral primary osteoarthritis of knee: Secondary | ICD-10-CM | POA: Diagnosis present

## 2024-01-14 DIAGNOSIS — Z9841 Cataract extraction status, right eye: Secondary | ICD-10-CM | POA: Diagnosis not present

## 2024-01-14 DIAGNOSIS — Z79899 Other long term (current) drug therapy: Secondary | ICD-10-CM | POA: Diagnosis not present

## 2024-01-14 DIAGNOSIS — W010XXA Fall on same level from slipping, tripping and stumbling without subsequent striking against object, initial encounter: Secondary | ICD-10-CM | POA: Diagnosis present

## 2024-01-14 DIAGNOSIS — G8929 Other chronic pain: Secondary | ICD-10-CM | POA: Diagnosis present

## 2024-01-14 DIAGNOSIS — Z85820 Personal history of malignant melanoma of skin: Secondary | ICD-10-CM | POA: Diagnosis not present

## 2024-01-14 DIAGNOSIS — Z66 Do not resuscitate: Secondary | ICD-10-CM | POA: Diagnosis present

## 2024-01-14 DIAGNOSIS — Z8249 Family history of ischemic heart disease and other diseases of the circulatory system: Secondary | ICD-10-CM | POA: Diagnosis not present

## 2024-01-14 DIAGNOSIS — R54 Age-related physical debility: Secondary | ICD-10-CM | POA: Diagnosis present

## 2024-01-14 DIAGNOSIS — Z9842 Cataract extraction status, left eye: Secondary | ICD-10-CM | POA: Diagnosis not present

## 2024-01-14 DIAGNOSIS — Z853 Personal history of malignant neoplasm of breast: Secondary | ICD-10-CM | POA: Diagnosis not present

## 2024-01-14 DIAGNOSIS — S32030D Wedge compression fracture of third lumbar vertebra, subsequent encounter for fracture with routine healing: Secondary | ICD-10-CM | POA: Diagnosis not present

## 2024-01-14 DIAGNOSIS — I10 Essential (primary) hypertension: Secondary | ICD-10-CM | POA: Diagnosis present

## 2024-01-14 DIAGNOSIS — Z87891 Personal history of nicotine dependence: Secondary | ICD-10-CM | POA: Diagnosis not present

## 2024-01-14 DIAGNOSIS — K2100 Gastro-esophageal reflux disease with esophagitis, without bleeding: Secondary | ICD-10-CM | POA: Diagnosis present

## 2024-01-14 DIAGNOSIS — S51819A Laceration without foreign body of unspecified forearm, initial encounter: Secondary | ICD-10-CM | POA: Diagnosis present

## 2024-01-14 DIAGNOSIS — S32039A Unspecified fracture of third lumbar vertebra, initial encounter for closed fracture: Secondary | ICD-10-CM | POA: Diagnosis present

## 2024-01-14 DIAGNOSIS — F419 Anxiety disorder, unspecified: Secondary | ICD-10-CM | POA: Diagnosis present

## 2024-01-14 DIAGNOSIS — E785 Hyperlipidemia, unspecified: Secondary | ICD-10-CM | POA: Diagnosis present

## 2024-01-14 DIAGNOSIS — Z923 Personal history of irradiation: Secondary | ICD-10-CM | POA: Diagnosis not present

## 2024-01-14 DIAGNOSIS — S32030A Wedge compression fracture of third lumbar vertebra, initial encounter for closed fracture: Secondary | ICD-10-CM | POA: Diagnosis not present

## 2024-01-14 DIAGNOSIS — Z23 Encounter for immunization: Secondary | ICD-10-CM | POA: Diagnosis present

## 2024-01-14 DIAGNOSIS — Z8261 Family history of arthritis: Secondary | ICD-10-CM | POA: Diagnosis not present

## 2024-01-14 DIAGNOSIS — D638 Anemia in other chronic diseases classified elsewhere: Secondary | ICD-10-CM | POA: Diagnosis present

## 2024-01-14 LAB — BASIC METABOLIC PANEL WITH GFR
Anion gap: 8 (ref 5–15)
BUN: 22 mg/dL (ref 8–23)
CO2: 25 mmol/L (ref 22–32)
Calcium: 9.4 mg/dL (ref 8.9–10.3)
Chloride: 108 mmol/L (ref 98–111)
Creatinine, Ser: 0.63 mg/dL (ref 0.44–1.00)
GFR, Estimated: >60 mL/min
Glucose, Bld: 104 mg/dL — ABNORMAL HIGH (ref 70–99)
Potassium: 3.9 mmol/L (ref 3.5–5.1)
Sodium: 141 mmol/L (ref 135–145)

## 2024-01-14 LAB — CBC
HCT: 33.2 % — ABNORMAL LOW (ref 36.0–46.0)
Hemoglobin: 11.1 g/dL — ABNORMAL LOW (ref 12.0–15.0)
MCH: 32.3 pg (ref 26.0–34.0)
MCHC: 33.4 g/dL (ref 30.0–36.0)
MCV: 96.5 fL (ref 80.0–100.0)
Platelets: 260 K/uL (ref 150–400)
RBC: 3.44 MIL/uL — ABNORMAL LOW (ref 3.87–5.11)
RDW: 15.6 % — ABNORMAL HIGH (ref 11.5–15.5)
WBC: 7.4 K/uL (ref 4.0–10.5)
nRBC: 0 % (ref 0.0–0.2)

## 2024-01-14 MED ORDER — KETOROLAC TROMETHAMINE 15 MG/ML IJ SOLN
15.0000 mg | Freq: Three times a day (TID) | INTRAMUSCULAR | Status: AC
  Administered 2024-01-14 – 2024-01-18 (×9): 15 mg via INTRAVENOUS
  Filled 2024-01-14 (×13): qty 1

## 2024-01-14 MED ORDER — ACETAMINOPHEN 500 MG PO TABS
1000.0000 mg | ORAL_TABLET | Freq: Three times a day (TID) | ORAL | Status: DC
  Administered 2024-01-14 – 2024-01-21 (×17): 1000 mg via ORAL
  Filled 2024-01-14 (×14): qty 2

## 2024-01-14 MED ORDER — ACETAMINOPHEN 500 MG PO TABS: 1000.0000 mg | ORAL_TABLET | Freq: Three times a day (TID) | ORAL | Status: AC

## 2024-01-14 MED ORDER — OXYCODONE HCL 5 MG PO TABS: 5.0000 mg | ORAL_TABLET | Freq: Four times a day (QID) | ORAL | 0 refills | Status: AC | PRN

## 2024-01-14 MED ORDER — DICLOFENAC SODIUM 1 % EX GEL
2.0000 g | Freq: Four times a day (QID) | CUTANEOUS | Status: DC
  Administered 2024-01-14 – 2024-01-21 (×22): 2 g via TOPICAL
  Filled 2024-01-14: qty 100

## 2024-01-14 NOTE — Evaluation (Signed)
 Occupational Therapy Evaluation Patient Details Name: Victoria Long MRN: [id number] DOB: 11/12/1928 Today's Date: 01/14/2024   History of Present Illness   89 year old female who presented to the ED after fall at [facility]. CT revealed  L3 compression fracture. Pt also with R forearm laceration requiring sutures.  PMH: T6 and L1 compression fractures, S/P L1 kyphoplasty 12/10/23, bilateral knee osteoarthritis, stage Ia bilateral breast cancer s/p lumpectomy and radiation, pancreatic mass, prediabetes, vitamin D  deficiency and hyperlipidemia     Clinical Impressions The pt is currently presenting with the below listed deficits (see OT problem list). As such, her ADL performance is compromised and she requires assist for self care management. During the session today, she required min assist for supine to sit, mod assist for simulated lower body dressing, min assist to step-pivot to the chair using a RW and set-up assist for face washing seated in the chair. She was noted to be with low back pain with activity, impaired ADL performance, and impaired functional mobility. She will benefit from OT services to maximize her independence with ADLs. Patient will benefit from continued inpatient follow up therapy, <3 hours/day.      If plan is discharge home, recommend the following:   A little help with walking and/or transfers;Assistance with cooking/housework;A lot of help with bathing/dressing/bathroom;Assist for transportation     Functional Status Assessment   Patient has had a recent decline in their functional status and demonstrates the ability to make significant improvements in function in a reasonable and predictable amount of time.     Equipment Recommendations   Other (comment) (defer to next setting)     Recommendations for Other Services         Precautions/Restrictions   Precautions Precautions: Fall;Back Precaution/Restrictions Comments: back precautions for  comfort Restrictions Weight Bearing Restrictions Per Provider Order: No Other Position/Activity Restrictions: TLSO for comfort     Mobility Bed Mobility Overal bed mobility: Needs Assistance Bed Mobility: Supine to Sit     Supine to sit: HOB elevated, Used rails, Min assist          Transfers Overall transfer level: Needs assistance Equipment used: Rolling walker (2 wheels) Transfers: Sit to/from Stand, Bed to chair/wheelchair/BSC Sit to Stand: Min assist     Step pivot transfers: Min assist            Balance     Sitting balance-Leahy Scale: Fair         Standing balance comment: Min assist with RW         ADL either performed or assessed with clinical judgement   ADL Overall ADL's : Needs assistance/impaired Eating/Feeding: Set up;Sitting   Grooming: Set up;Sitting Grooming Details (indicate cue type and reason): She performed face washing seated in the chair. Upper Body Bathing: Set up;Supervision/ safety;Sitting   Lower Body Bathing: Moderate assistance;Sitting/lateral leans   Upper Body Dressing : Minimal assistance;Sitting   Lower Body Dressing: Moderate assistance;Sitting/lateral leans   Toilet Transfer: Minimal assistance;Stand-pivot;Rolling walker (2 wheels);BSC/3in1   Toileting- Clothing Manipulation and Hygiene: Moderate assistance;Sit to/from stand Toileting - Clothing Manipulation Details (indicate cue type and reason): at bedside commode level, based on clinical judgement                          Pertinent Vitals/Pain Pain Assessment Pain Assessment: Faces Pain Score: 4  Pain Location: back with activity Pain Descriptors / Indicators: Discomfort, Sore Pain Intervention(s): Limited activity within patient's tolerance, Monitored during session, Repositioned  Extremity/Trunk Assessment Upper Extremity Assessment Upper Extremity Assessment: Right hand dominant;Generalized weakness;RUE deficits/detail;LUE  deficits/detail RUE Deficits / Details: AROM WFL. Gross strength 4/5 LUE Deficits / Details: AROM WFL. Gross strength 4/5   Lower Extremity Assessment Lower Extremity Assessment: Generalized weakness;RLE deficits/detail;LLE deficits/detail RLE Deficits / Details: AROM WFL LLE Deficits / Details: AROM WFL       Communication Communication Factors Affecting Communication: Hearing impaired   Cognition Arousal: Alert Behavior During Therapy: WFL for tasks assessed/performed               OT - Cognition Comments: Oriented to person, place, month, and year                 Following commands: Intact (Followed 1 step commands consistently.)       Cueing  General Comments   Cueing Techniques: Verbal cues;Tactile cues              Home Living       Type of Home: Independent living facility ([facility]) Home Access: Level entry      Home Equipment: Rollator (4 wheels);Rolling Walker (2 wheels);Cane - single point;BSC/3in1          Prior Functioning/Environment Prior Level of Function : Needs assist             Mobility Comments: uses rollator for ambulation ADLs Comments:  (She reported needing assist for bathing, and being independent with dressing and toileting.)    OT Problem List: Decreased strength;Impaired balance (sitting and/or standing);Decreased knowledge of use of DME or AE;Decreased knowledge of precautions;Pain   OT Treatment/Interventions: Self-care/ADL training;Therapeutic exercise;Energy conservation;DME and/or AE instruction;Therapeutic activities;Balance training;Patient/family education      OT Goals(Current goals can be found in the care plan section)   Acute Rehab OT Goals OT Goal Formulation: With patient Time For Goal Achievement: 01/28/24 Potential to Achieve Goals: Good ADL Goals Pt Will Perform Upper Body Dressing: with set-up;sitting Pt Will Perform Lower Body Dressing: with supervision;with set-up;sitting/lateral  leans;sit to/from stand;with adaptive equipment Pt Will Transfer to Toilet: with supervision;ambulating;grab bars Pt Will Perform Toileting - Clothing Manipulation and hygiene: with supervision;sit to/from stand   OT Frequency:  Min 2X/week       AM-PAC OT 6 Clicks Daily Activity     Outcome Measure Help from another person eating meals?: A Little Help from another person taking care of personal grooming?: A Little Help from another person toileting, which includes using toliet, bedpan, or urinal?: A Lot Help from another person bathing (including washing, rinsing, drying)?: A Lot Help from another person to put on and taking off regular upper body clothing?: A Little Help from another person to put on and taking off regular lower body clothing?: A Lot 6 Click Score: 15   End of Session Equipment Utilized During Treatment: Gait belt;Rolling walker (2 wheels) Nurse Communication: Mobility status  Activity Tolerance: Patient limited by pain Patient left: in chair;with call bell/phone within reach;with chair alarm set  OT Visit Diagnosis: Unsteadiness on feet (R26.81);Other abnormalities of gait and mobility (R26.89);Muscle weakness (generalized) (M62.81);History of falling (Z91.81);Pain Pain - part of body:  (low back)                Time: 8345-8288 OT Time Calculation (min): 17 min Charges:  OT General Charges $OT Visit: 1 Visit OT Evaluation $OT Eval Moderate Complexity: 1 Mod  Avantae Bither J Harris, OTR/L 01/14/2024, 6:07 PM

## 2024-01-14 NOTE — Telephone Encounter (Signed)
 Patient is ready for scheduling for Prolia . Please contact patient for scheduling and advise patient of $40 copay.

## 2024-01-14 NOTE — Progress Notes (Signed)
 " PROGRESS NOTE    Victoria Long  FMW:[id number] DOB: Dec 21, 1928 DOA: 01/13/2024 PCP: Ozell Heron HERO, MD    Brief Narrative:   89 year old with history of bilateral knee osteoarthritis, previously treated breast cancer and known pancreatic mass, prediabetes, HLD had a recent hospital stay for acute L1 compression fracture status post IR kyphoplasty who is admitted with acute L3 anterior inferior chip fracture from a mechanical fall after facility.  Neurosurgery reviewed the case and recommended TLSO brace for comfort otherwise no other surgical indication.  PT recommended SNF placement and therefore TOC consulted  Assessment & Plan:   Acute L3 compression fracture - This is secondary to mechanical fall.  Seen by neurosurgery recommending TLSO brace and pain control otherwise no other interventions planned -PT/OT recommending SNF, consulted TOC pharmacy  Depression - Zoloft   GERD - Pepcid   Essential hypertension -Currently on ARB.  IV as needed  Bilateral knee osteoarthritis -Chronic pain control.  Anemia of chronic disease -Hemoglobin around baseline of 12  DVT prophylaxis: enoxaparin  (LOVENOX ) injection 30 mg Start: 01/13/24 2200      Code Status: Limited: Do not attempt resuscitation (DNR) -DNR-LIMITED -Do Not Intubate/DNI  Family Communication: Son present at bedside Awaiting safe disposition  PT Follow up Recs: Skilled Nursing-Short Term Rehab (<3 Hours/Day)01/13/2024 1603  Subjective: Patient resting comfortably, did not rest much last night Son present at bedside.  All the questions answered by me   Examination:  General exam: Appears calm and comfortable, elderly frail Respiratory system: Clear to auscultation. Respiratory effort normal. Cardiovascular system: S1 & S2 heard, RRR. No JVD, murmurs, rubs, gallops or clicks. No pedal edema. Gastrointestinal system: Abdomen is nondistended, soft and nontender. No organomegaly or masses felt. Normal  bowel sounds heard. Central nervous system: Alert and oriented.  The perform full neuroexam she was quite asleep after having a long night Extremities: Symmetric 5 x 5 power. Skin: No rashes, lesions or ulcers Psychiatry: Difficult to assess                Diet Orders (From admission, onward)     Start     Ordered   01/13/24 1407  Diet regular Room service appropriate? Yes; Fluid consistency: Thin  Diet effective now       Question Answer Comment  Room service appropriate? Yes   Fluid consistency: Thin      01/13/24 1406            Objective: Vitals:   01/13/24 1530 01/13/24 2052 01/14/24 0050 01/14/24 0513  BP: (!) 127/58 130/61 (!) 146/72 (!) 155/67  Pulse: 78 83 68 82  Resp: 15 17 18 15   Temp: 97.9 F (36.6 C) 97.7 F (36.5 C) 98.8 F (37.1 C) 98 F (36.7 C)  TempSrc: Oral Oral Oral Oral  SpO2: 96% 97% 97% 95%    Intake/Output Summary (Last 24 hours) at 01/14/2024 1112 Last data filed at 01/14/2024 0524 Gross per 24 hour  Intake 210 ml  Output 0 ml  Net 210 ml   There were no vitals filed for this visit.  Scheduled Meds:  acetaminophen   1,000 mg Oral TID   diclofenac  Sodium  2 g Topical QID   enoxaparin  (LOVENOX ) injection  30 mg Subcutaneous Q24H   famotidine   20 mg Oral QHS   hydrOXYzine   25 mg Oral QHS   irbesartan   150 mg Oral Daily   ketorolac   15 mg Intravenous Q8H   sertraline   50 mg Oral Daily   Continuous Infusions:  Nutritional status     There is no height or weight on file to calculate BMI.  Data Reviewed:   CBC: Recent Labs  Lab 01/13/24 1315 01/14/24 0330  WBC 10.3 7.4  HGB 13.0 11.1*  HCT 38.9 33.2*  MCV 97.5 96.5  PLT 290 260   Basic Metabolic Panel: Recent Labs  Lab 01/13/24 1315 01/14/24 0330  NA 141 141  K 3.9 3.9  CL 104 108  CO2 25 25  GLUCOSE 102* 104*  BUN 14 22  CREATININE 0.54 0.63  CALCIUM  10.0 9.4   GFR: CrCl cannot be calculated (Unknown ideal weight.). Liver Function Tests: No  results for input(s): AST, ALT, ALKPHOS, BILITOT, PROT, ALBUMIN in the last 168 hours. No results for input(s): LIPASE, AMYLASE in the last 168 hours. No results for input(s): AMMONIA in the last 168 hours. Coagulation Profile: No results for input(s): INR, PROTIME in the last 168 hours. Cardiac Enzymes: No results for input(s): CKTOTAL, CKMB, CKMBINDEX, TROPONINI in the last 168 hours. BNP (last 3 results) No results for input(s): PROBNP in the last 8760 hours. HbA1C: No results for input(s): HGBA1C in the last 72 hours. CBG: No results for input(s): GLUCAP in the last 168 hours. Lipid Profile: No results for input(s): CHOL, HDL, LDLCALC, TRIG, CHOLHDL, LDLDIRECT in the last 72 hours. Thyroid  Function Tests: No results for input(s): TSH, T4TOTAL, FREET4, T3FREE, THYROIDAB in the last 72 hours. Anemia Panel: No results for input(s): VITAMINB12, FOLATE, FERRITIN, TIBC, IRON, RETICCTPCT in the last 72 hours. Sepsis Labs: No results for input(s): PROCALCITON, LATICACIDVEN in the last 168 hours.  No results found for this or any previous visit (from the past 240 hours).       Radiology Studies: CT PELVIS WO CONTRAST Result Date: 01/13/2024 CLINICAL DATA:  Fall.  Hip pain.  Suspected hip or pelvic fracture. EXAM: CT PELVIS WITHOUT CONTRAST TECHNIQUE: Multidetector CT imaging of the pelvis was performed following the standard protocol without intravenous contrast. RADIATION DOSE REDUCTION: This exam was performed according to the departmental dose-optimization program which includes automated exposure control, adjustment of the mA and/or kV according to patient size and/or use of iterative reconstruction technique. COMPARISON:  None Available. FINDINGS: Lower Urinary Tract: Unremarkable urinary bladder. Bowel: Sigmoid diverticulosis noted, without signs of diverticulitis. Vascular/Lymphatic: No pathologically enlarged  lymph nodes or other significant abnormality. Reproductive: 2 cm calcified pedunculated uterine fibroid seen arising from the left anterior fundus. Small amount of air seen in endometrial cavity, of uncertain etiology. No signs of inflammatory process or abnormal fluid collections. Other: None. Musculoskeletal: Generalized osteopenia noted. No evidence of hip fracture or dislocation. No pelvic fractures identified. Moderate bilateral hip osteoarthritis noted. No suspicious bone lesions identified. IMPRESSION: No evidence of hip or pelvic fracture. Moderate bilateral hip osteoarthritis. Small calcified uterine fibroid. Small amount of air in endometrial cavity, of uncertain etiology. Sigmoid diverticulosis, without radiographic evidence of diverticulitis. Electronically Signed   By: Norleen DELENA Kil M.D.   On: 01/13/2024 11:36   DG Forearm Right Result Date: 01/13/2024 EXAM: VIEW(S) XRAY OF THE RIGHT FOREARM 01/13/2024 09:55:00 AM COMPARISON: None available. CLINICAL HISTORY: 89 year old female with laceration, status post fall from standing. FINDINGS: BONES AND JOINTS: Degenerative changes of the wrist. SOFT TISSUES: Bandage material overlies the mid forearm. IMPRESSION: 1. No acute fracture or dislocation identified about the right forearm. Electronically signed by: Helayne Hurst MD 01/13/2024 10:42 AM EST RP Workstation: [id number]   CT Lumbar Spine Wo Contrast Result Date: 01/13/2024 EXAM: CT OF THE LUMBAR SPINE  WITHOUT CONTRAST 01/13/2024 10:14:00 AM TECHNIQUE: CT of the lumbar spine was performed without the administration of intravenous contrast. Multiplanar reformatted images are provided for review. Automated exposure control, iterative reconstruction, and/or weight based adjustment of the mA/kV was utilized to reduce the radiation dose to as low as reasonably achievable. COMPARISON: Lumbar spine CT 12/04/2023. CLINICAL HISTORY: 89 year old female. Back trauma, no prior imaging. Status post fall from  standing. Status post L1 compression fracture augmentation. FINDINGS: BONES AND ALIGNMENT: Normal lumbar segmentation as previously designated. Generalized osteopenia. T12 vertebrae appears stable and intact. Interval L1 vertebral body augmentation. Mild retropulsion there is new from November. But no other complicating features. L2 is stable and intact. Subtle new L3 compression fracture since November with 12% loss of vertebral body height. Mild anterior buckling of the L3 vertebral body on series 6 image 40 and similar mild cortical disruption on coronal image 29. L3 pedicles and posterior elements appear intact and aligned. No retropulsion. L4 and L5 appears stable and intact. Visible sacrum and SI joints appears stable and intact. SOFT TISSUES: Chronically abnormal pancreatic tail parenchyma with combined multicystic and dystrophic calcification appearance on series 8 image 67 is stable from November. The visualized portion of the mass is roughly 7 to 8 cm. This meets consensus criteria for follow up surgical consultation in > 46 years of age. Advanced calcified aortoiliac atherosclerosis. Partially visible large bowel diverticulosis and retained stool. Negative lumbar paraspinal soft tissues. DEGENERATIVE CHANGES: Chronic lumbar spine degeneration is stable. IMPRESSION: 1. Osteopenia with new L3 compression fracture since last month, most likely acute. 13% loss of height with no retropulsion or complicating features. 2. Abnormal multicystic and dystrophic calcifications replacement of visible pancreas (estimated 7-8 cm). Lesion size meets consensus criteria for surgical consultation in patients > 2 years old. 3. Interval L1 vertebral body augmentation. Electronically signed by: Helayne Hurst MD 01/13/2024 10:38 AM EST RP Workstation: [id number]   CT Cervical Spine Wo Contrast Result Date: 01/13/2024 EXAM: CT CERVICAL SPINE WITHOUT CONTRAST 01/13/2024 10:14:00 AM TECHNIQUE: CT of the cervical spine was  performed without the administration of intravenous contrast. Multiplanar reformatted images are provided for review. Automated exposure control, iterative reconstruction, and/or weight based adjustment of the mA/kV was utilized to reduce the radiation dose to as low as reasonably achievable. COMPARISON: CT thoracic spine 12/04/2023. CLINICAL HISTORY: Neck trauma. Fall from standing. FINDINGS: BONES AND ALIGNMENT: Trace anterolisthesis of C7 on T1. No acute fracture or suspicious lesion. DEGENERATIVE CHANGES: Multilevel disc degeneration, most advanced at C4-C5 and C6-C7. Asymmetrically advanced left sided facet arthrosis in the upper cervical spine. Advanced foraminal stenosis on the left at C3-C4 due to facet spurring and on the right at C4-C5 due to uncovertebral spurring. SOFT TISSUES: No prevertebral soft tissue swelling. IMPRESSION: 1. No acute cervical spine fracture or traumatic malalignment. Electronically signed by: Dasie Hamburg MD 01/13/2024 10:31 AM EST RP Workstation: [id number]   CT Head Wo Contrast Result Date: 01/13/2024 EXAM: CT HEAD WITHOUT CONTRAST 01/13/2024 10:14:00 AM TECHNIQUE: CT of the head was performed without the administration of intravenous contrast. Automated exposure control, iterative reconstruction, and/or weight based adjustment of the mA/kV was utilized to reduce the radiation dose to as low as reasonably achievable. COMPARISON: None available. CLINICAL HISTORY: Head trauma, minor. Fall from standing. FINDINGS: BRAIN AND VENTRICLES: There is no evidence of an acute infarct, intracranial hemorrhage, mass, midline shift, hydrocephalus, or extra-axial fluid collection. Mild cerebral atrophy is within normal limits for age. Cerebral white matter hypodensities are nonspecific but compatible with mild  chronic small vessel ischemic disease. Calcified atherosclerosis at the skull base. ORBITS: No acute abnormality. Bilateral cataract extraction. SINUSES: No acute abnormality. SOFT  TISSUES AND SKULL: No acute soft tissue abnormality. No skull fracture. IMPRESSION: 1. No acute intracranial abnormality. 2. Mild chronic small vessel ischemic disease. Electronically signed by: Dasie Hamburg MD 01/13/2024 10:23 AM EST RP Workstation: [id number]           LOS: 0 days   Time spent= 35 mins    Burgess JAYSON Dare, MD [facility]  If 7PM-7AM, please contact night-coverage  01/14/2024, 11:12 AM  "

## 2024-01-14 NOTE — Plan of Care (Signed)
   Problem: Activity: Goal: Risk for activity intolerance will decrease Outcome: Progressing   Problem: Pain Managment: Goal: General experience of comfort will improve and/or be controlled Outcome: Progressing   Problem: Safety: Goal: Ability to remain free from injury will improve Outcome: Progressing

## 2024-01-14 NOTE — TOC Initial Note (Addendum)
 Transition of Care [facility]) - Initial/Assessment Note    Patient Details  Name: Victoria Long MRN: [id number] Date of Birth: [birth year]  [facility] CM/SW Contact:    NORMAN ASPEN, LCSW Phone Number: 01/14/2024, 1:21 PM  Clinical Narrative:                  Met with pt and son, Oneil, this morning with son providing most information.  Both confirm pt is a resident in [place] apt at [facility] and on a wait list for ALF apt.  Unfortunately, pt has had another fall and new spinal fx and affecting her mobility.  Son aware that pt will likely require SNF rehab again.  Of note, pt dc'd to [facility] after last fall/fx.  Family would prefer [facility] if possible.  Have alerted facility and they will review.   ADDENDUM: [facility] is unable to offer SNF bed.  SNF bed search area widened.  Expected Discharge Plan: Skilled Nursing Facility Barriers to Discharge: Continued Medical Work up, English As A Second Language Teacher, SNF Pending bed offer   Patient Goals and CMS Choice Patient states their goals for this hospitalization and ongoing recovery are:: return to [employer] after rehab          Expected Discharge Plan and Services In-house Referral: Clinical Social Work     Living arrangements for the past 2 months: Marketing Executive                 DME Arranged: N/A DME Agency: NA                  Prior Living Arrangements/Services Living arrangements for the past 2 months: Independent Living Facility Lives with:: Facility Resident Patient language and need for interpreter reviewed:: Yes Do you feel safe going back to the place where you live?: Yes      Need for Family Participation in Patient Care: No (Comment) Care giver support system in place?: Yes (comment)   Criminal Activity/Legal Involvement Pertinent to Current Situation/Hospitalization: No - Comment as needed  Activities of Daily Living   ADL Screening (condition at time of  admission) Independently performs ADLs?: Yes (appropriate for developmental age) Is the patient deaf or have difficulty hearing?: No Does the patient have difficulty seeing, even when wearing glasses/contacts?: No Does the patient have difficulty concentrating, remembering, or making decisions?: No  Permission Sought/Granted Permission sought to share information with : Family Supports Permission granted to share information with : Yes, Verbal Permission Granted  Share Information with NAME: son, Debby Jeronimo @ [phone]           Emotional Assessment Appearance:: Appears stated age Attitude/Demeanor/Rapport: Gracious Affect (typically observed): Accepting Orientation: : Oriented to Self Alcohol  / Substance Use: Not Applicable Psych Involvement: No (comment)  Admission diagnosis:  Anxiety [F41.9] Essential hypertension [I10] Closed compression fracture of L3 vertebra (HCC) [S32.030A] Gastroesophageal reflux disease with esophagitis without hemorrhage [K21.00] Patient Active Problem List   Diagnosis Date Noted   Closed compression fracture of L3 vertebra (HCC) 01/13/2024   Protein-calorie malnutrition, severe 12/10/2023   Closed compression fracture of thoracolumbar vertebra (HCC) 12/08/2023   Normocytic anemia 12/08/2023   External hemorrhoids 06/14/2020   Sigmoid diverticulosis 05/02/2020   Senile purpura 01/11/2019   Unilateral primary osteoarthritis, right knee 08/13/2018   Atherosclerosis of aorta 05/28/2018   Malignant neoplasm of upper-outer quadrant of left breast in female, estrogen receptor positive (HCC) 07/23/2017   Melanoma in situ of upper arm, right (HCC) 07/23/2017   At high  risk for falls 07/23/2017   Invasive ductal carcinoma of breast, female, left (HCC) 07/15/2017   Essential hypertension 10/01/2016   Pancreatic mass 08/22/2016   History of right breast cancer 10/25/2013   Encounter for long-term (current) use of medications 04/19/2013    Hyperlipidemia    Abnormal glucose    Vitamin D  deficiency    PCP:  Ozell Heron HERO, MD Pharmacy:   [facility] [id number] - [address], [place] - [address] [facility] [address] [facility] [address] [place] [address] Phone: [phone] Fax: [phone]     Social Drivers of Health (SDOH) Social History: SDOH Screenings   Food Insecurity: No Food Insecurity (01/13/2024)  Housing: Low Risk (01/13/2024)  Transportation Needs: No Transportation Needs (01/13/2024)  Utilities: Not At Risk (01/13/2024)  Alcohol  Screen: Low Risk (11/25/2023)  Depression (PHQ2-9): Low Risk (11/25/2023)  Financial Resource Strain: Low Risk (11/25/2023)  Physical Activity: Inactive (11/25/2023)  Social Connections: Socially Isolated (01/13/2024)  Stress: No Stress Concern Present (11/25/2023)  Tobacco Use: Medium Risk (01/13/2024)  Health Literacy: Adequate Health Literacy (11/25/2023)   SDOH Interventions:     Readmission Risk Interventions     No data to display

## 2024-01-14 NOTE — NC FL2 (Signed)
 " [place]  MEDICAID FL2 LEVEL OF CARE FORM     IDENTIFICATION  Patient Name: Victoria Long Birthdate: Oct 06, 1928 Sex: female Admission Date (Current Location): 01/13/2024  [facility] and [place] Number:  Producer, Television/film/video and Address:  [facility],  [address], [place] [address]      Provider Number: [id number]  Attending Physician Name and Address:  Caleen Burgess BROCKS, MD  Relative Name and Phone Number:  son, Debby Jeronimo @ [phone]    Current Level of Care: Hospital Recommended Level of Care: Skilled Nursing Facility Prior Approval Number:    Date Approved/Denied:   PASRR Number: [id number]  Discharge Plan: SNF    Current Diagnoses: Patient Active Problem List   Diagnosis Date Noted   Closed compression fracture of L3 vertebra (HCC) 01/13/2024   Protein-calorie malnutrition, severe 12/10/2023   Closed compression fracture of thoracolumbar vertebra (HCC) 12/08/2023   Normocytic anemia 12/08/2023   External hemorrhoids 06/14/2020   Sigmoid diverticulosis 05/02/2020   Senile purpura 01/11/2019   Unilateral primary osteoarthritis, right knee 08/13/2018   Atherosclerosis of aorta 05/28/2018   Malignant neoplasm of upper-outer quadrant of left breast in female, estrogen receptor positive (HCC) 07/23/2017   Melanoma in situ of upper arm, right (HCC) 07/23/2017   At high risk for falls 07/23/2017   Invasive ductal carcinoma of breast, female, left (HCC) 07/15/2017   Essential hypertension 10/01/2016   Pancreatic mass 08/22/2016   History of right breast cancer 10/25/2013   Encounter for long-term (current) use of medications 04/19/2013   Hyperlipidemia    Abnormal glucose    Vitamin D  deficiency     Orientation RESPIRATION BLADDER Height & Weight     Self  Normal Continent Weight:   Height:     BEHAVIORAL SYMPTOMS/MOOD NEUROLOGICAL BOWEL NUTRITION STATUS      Continent Diet (regular)  AMBULATORY STATUS COMMUNICATION OF NEEDS Skin    Extensive Assist Verbally Normal                       Personal Care Assistance Level of Assistance  Bathing, Dressing Bathing Assistance: Limited assistance   Dressing Assistance: Limited assistance     Functional Limitations Info  Sight, Hearing, Speech Sight Info: Adequate Hearing Info: Impaired Speech Info: Adequate    SPECIAL CARE FACTORS FREQUENCY  PT (By licensed PT), OT (By licensed OT)     PT Frequency: 5x/wk OT Frequency: 5x/wk            Contractures Contractures Info: Not present    Additional Factors Info  Code Status, Allergies Code Status Info: DNR Allergies Info: Anastrozole , Tramadol            Current Medications (01/14/2024):  This is the current hospital active medication list Current Facility-Administered Medications  Medication Dose Route Frequency Provider Last Rate Last Admin   acetaminophen  (TYLENOL ) tablet 650 mg  650 mg Oral Q6H PRN Zella, Mir M, MD   650 mg at 01/13/24 2201   Or   acetaminophen  (TYLENOL ) suppository 650 mg  650 mg Rectal Q6H PRN Zella, Mir M, MD       acetaminophen  (TYLENOL ) tablet 1,000 mg  1,000 mg Oral TID Amin, Ankit C, MD   1,000 mg at 01/14/24 0844   albuterol  (PROVENTIL ) (2.5 MG/3ML) 0.083% nebulizer solution 2.5 mg  2.5 mg Nebulization Q2H PRN Zella, Mir M, MD       artificial tears ophthalmic solution 1 drop  1 drop Both Eyes TID PRN Zella,  Mir M, MD       carbamide peroxide (DEBROX) 6.5 % OTIC (EAR) solution 5 drop  5 drop Both EARS BID PRN Zella, Mir M, MD       diclofenac  Sodium (VOLTAREN ) 1 % topical gel 2 g  2 g Topical QID Amin, Ankit C, MD   2 g at 01/14/24 0945   enoxaparin  (LOVENOX ) injection 30 mg  30 mg Subcutaneous Q24H Zella, Mir M, MD   30 mg at 01/13/24 2201   famotidine  (PEPCID ) tablet 20 mg  20 mg Oral QHS Zella, Mir M, MD   20 mg at 01/13/24 2201   fentaNYL  (SUBLIMAZE ) injection 12.5 mcg  12.5 mcg Intravenous Q2H PRN Zella, Mir M, MD        hydrOXYzine  (ATARAX ) tablet 25 mg  25 mg Oral QHS Zella, Mir M, MD   25 mg at 01/13/24 2201   irbesartan  (AVAPRO ) tablet 150 mg  150 mg Oral Daily Ikramullah, Mir M, MD   150 mg at 01/14/24 0844   ketorolac  (TORADOL ) 15 MG/ML injection 15 mg  15 mg Intravenous Q8H Amin, Ankit C, MD   15 mg at 01/14/24 0846   ondansetron  (ZOFRAN ) tablet 4 mg  4 mg Oral Q6H PRN Zella, Mir M, MD       Or   ondansetron  (ZOFRAN ) injection 4 mg  4 mg Intravenous Q6H PRN Zella, Mir M, MD       oxyCODONE  (Oxy IR/ROXICODONE ) immediate release tablet 5 mg  5 mg Oral Q4H PRN Zella, Mir M, MD       sertraline  (ZOLOFT ) tablet 50 mg  50 mg Oral Daily Ikramullah, Mir M, MD   50 mg at 01/14/24 0844   traZODone  (DESYREL ) tablet 25 mg  25 mg Oral QHS PRN Zella Katha HERO, MD         Discharge Medications: Please see discharge summary for a list of discharge medications.  Relevant Imaging Results:  Relevant Lab Results:   Additional Information SSN [id number]  Keyvon Herter, LCSW     "

## 2024-01-14 NOTE — Hospital Course (Addendum)
 Brief Narrative:   89 year old with history of bilateral knee osteoarthritis, previously treated breast cancer and known pancreatic mass, prediabetes, HLD had a recent hospital stay for acute L1 compression fracture status post IR kyphoplasty who is admitted with acute L3 anterior inferior chip fracture from a mechanical fall after facility.  Neurosurgery reviewed the case and recommended TLSO brace for comfort otherwise no other surgical indication.  PT recommended SNF placement and therefore TOC consulted.  We are currently awaiting placement.  Assessment & Plan:   Acute L3 compression fracture - This is secondary to mechanical fall.  Seen by neurosurgery recommending TLSO brace and pain control otherwise no other interventions planned -PT/OT recommending SNF, would benefit from palliative care team to follow the patient at rehab facility  Depression - Zoloft   GERD - Pepcid   Essential hypertension -Currently on ARB.  IV as needed  Bilateral knee osteoarthritis -Chronic pain control.  Anemia of chronic disease -Hemoglobin around baseline of 12  Recommend palliative team to follow patient outpatient DNR form signed in the chart  DVT prophylaxis: Lovenox      Code Status: Limited: Do not attempt resuscitation (DNR) -DNR-LIMITED -Do Not Intubate/DNI  Family Communication: Family up-to-date Awaiting safe disposition  PT Follow up Recs: SNF  Subjective: Resting comfortably, family is at bedside.  Examination:  General exam: Appears calm and comfortable, elderly frail Respiratory system: Clear to auscultation. Respiratory effort normal. Cardiovascular system: S1 & S2 heard, RRR. No JVD, murmurs, rubs, gallops or clicks. No pedal edema. Gastrointestinal system: Abdomen is nondistended, soft and nontender. No organomegaly or masses felt. Normal bowel sounds heard. Central nervous system: Alert and oriented.  The perform full neuroexam she was quite asleep after having a long  night Extremities: Symmetric 5 x 5 power. Skin: No rashes, lesions or ulcers Psychiatry: Difficult to assess

## 2024-01-14 NOTE — Progress Notes (Signed)
 Orthopedic Tech Progress Note Patient Details:  Victoria Long 05-Jul-1928 [id number]  Ortho Devices Type of Ortho Device: Thoracolumbar corset (TLSO) Ortho Device/Splint Location: Left at bedside. Will fit before she is discharged. Ortho Device/Splint Interventions: Ordered   Post Interventions Patient Tolerated: Well  Adine MARLA Blush 01/14/2024, 2:10 PM

## 2024-01-15 DIAGNOSIS — S32030D Wedge compression fracture of third lumbar vertebra, subsequent encounter for fracture with routine healing: Secondary | ICD-10-CM | POA: Diagnosis not present

## 2024-01-15 NOTE — Progress Notes (Signed)
 Darryle Long [address] [facility] Liaison Note:  Notified by Lorraine SINNING manager of family request for [facility]iative services at [facility] after discharge.   Please call with any hospice or outpatient palliative care related questions.   Thank you for the opportunity to participate in this patient's care.   Greig Basket, BSN, RN [facility] Liaison [phone]

## 2024-01-15 NOTE — Progress Notes (Signed)
 " PROGRESS NOTE    Victoria Long  FMW:[id number] DOB: 22-Nov-1928 DOA: 01/13/2024 PCP: Ozell Heron HERO, MD    Brief Narrative:   89 year old with history of bilateral knee osteoarthritis, previously treated breast cancer and known pancreatic mass, prediabetes, HLD had a recent hospital stay for acute L1 compression fracture status post IR kyphoplasty who is admitted with acute L3 anterior inferior chip fracture from a mechanical fall after facility.  Neurosurgery reviewed the case and recommended TLSO brace for comfort otherwise no other surgical indication.  PT recommended SNF placement and therefore TOC consulted  Assessment & Plan:   Acute L3 compression fracture - This is secondary to mechanical fall.  Seen by neurosurgery recommending TLSO brace and pain control otherwise no other interventions planned -PT/OT recommending SNF, consulted TOC to reach out to hospice as family is interested in discussing possible future  Depression - Zoloft   GERD - Pepcid   Essential hypertension -Currently on ARB.  IV as needed  Bilateral knee osteoarthritis -Chronic pain control.  Anemia of chronic disease -Hemoglobin around baseline of 12  DVT prophylaxis: enoxaparin  (LOVENOX ) injection 30 mg Start: 01/13/24 2200      Code Status: Limited: Do not attempt resuscitation (DNR) -DNR-LIMITED -Do Not Intubate/DNI  Family Communication: Son present at bedside Awaiting safe disposition  PT Follow up Recs: Skilled Nursing-Short Term Rehab (<3 Hours/Day)01/13/2024 1603  Subjective: Resting ok. Still having some pain Pleasantly confused. Son is at bedside  Examination:  General exam: Appears calm and comfortable, elderly frail Respiratory system: Clear to auscultation. Respiratory effort normal. Cardiovascular system: S1 & S2 heard, RRR. No JVD, murmurs, rubs, gallops or clicks. No pedal edema. Gastrointestinal system: Abdomen is nondistended, soft and nontender. No organomegaly  or masses felt. Normal bowel sounds heard. Central nervous system: Alert and oriented.  The perform full neuroexam she was quite asleep after having a long night Extremities: Symmetric 5 x 5 power. Skin: No rashes, lesions or ulcers Psychiatry: Difficult to assess                Diet Orders (From admission, onward)     Start     Ordered   01/13/24 1407  Diet regular Room service appropriate? Yes; Fluid consistency: Thin  Diet effective now       Question Answer Comment  Room service appropriate? Yes   Fluid consistency: Thin      01/13/24 1406            Objective: Vitals:   01/14/24 0513 01/14/24 1303 01/14/24 2123 01/15/24 0546  BP: (!) 155/67 (!) 144/66 (!) 154/68 (!) 148/66  Pulse: 82 85 72 70  Resp: 15 18 14 18   Temp: 98 F (36.7 C) 97.8 F (36.6 C) 98.5 F (36.9 C) 98.5 F (36.9 C)  TempSrc: Oral  Oral   SpO2: 95% 100% 97% 95%    Intake/Output Summary (Last 24 hours) at 01/15/2024 1045 Last data filed at 01/15/2024 0830 Gross per 24 hour  Intake 170 ml  Output --  Net 170 ml   There were no vitals filed for this visit.  Scheduled Meds:  acetaminophen   1,000 mg Oral TID   diclofenac  Sodium  2 g Topical QID   enoxaparin  (LOVENOX ) injection  30 mg Subcutaneous Q24H   famotidine   20 mg Oral QHS   hydrOXYzine   25 mg Oral QHS   irbesartan   150 mg Oral Daily   ketorolac   15 mg Intravenous Q8H   sertraline   50 mg Oral Daily  Continuous Infusions:  Nutritional status     There is no height or weight on file to calculate BMI.  Data Reviewed:   CBC: Recent Labs  Lab 01/13/24 1315 01/14/24 0330  WBC 10.3 7.4  HGB 13.0 11.1*  HCT 38.9 33.2*  MCV 97.5 96.5  PLT 290 260   Basic Metabolic Panel: Recent Labs  Lab 01/13/24 1315 01/14/24 0330  NA 141 141  K 3.9 3.9  CL 104 108  CO2 25 25  GLUCOSE 102* 104*  BUN 14 22  CREATININE 0.54 0.63  CALCIUM  10.0 9.4   GFR: CrCl cannot be calculated (Unknown ideal weight.). Liver  Function Tests: No results for input(s): AST, ALT, ALKPHOS, BILITOT, PROT, ALBUMIN in the last 168 hours. No results for input(s): LIPASE, AMYLASE in the last 168 hours. No results for input(s): AMMONIA in the last 168 hours. Coagulation Profile: No results for input(s): INR, PROTIME in the last 168 hours. Cardiac Enzymes: No results for input(s): CKTOTAL, CKMB, CKMBINDEX, TROPONINI in the last 168 hours. BNP (last 3 results) No results for input(s): PROBNP in the last 8760 hours. HbA1C: No results for input(s): HGBA1C in the last 72 hours. CBG: No results for input(s): GLUCAP in the last 168 hours. Lipid Profile: No results for input(s): CHOL, HDL, LDLCALC, TRIG, CHOLHDL, LDLDIRECT in the last 72 hours. Thyroid  Function Tests: No results for input(s): TSH, T4TOTAL, FREET4, T3FREE, THYROIDAB in the last 72 hours. Anemia Panel: No results for input(s): VITAMINB12, FOLATE, FERRITIN, TIBC, IRON, RETICCTPCT in the last 72 hours. Sepsis Labs: No results for input(s): PROCALCITON, LATICACIDVEN in the last 168 hours.  No results found for this or any previous visit (from the past 240 hours).       Radiology Studies: No results found.         LOS: 1 day   Time spent= 35 mins    Burgess JAYSON Dare, MD [facility]  If 7PM-7AM, please contact night-coverage  01/15/2024, 10:45 AM  "

## 2024-01-15 NOTE — TOC Progression Note (Signed)
 Transition of Care [facility]) - Progression Note    Patient Details  Name: Victoria Long MRN: [id number] Date of Birth: 12-08-1928  [facility]) CM/SW Contact  Lorraine LILLETTE Fenton, LCSW Phone Number: 01/15/2024, 11:18 AM  Clinical Narrative:    CSW contacted by MD- family interested in palliative/hospice.  CSW contacted son Debby regarding choice.  He states the family chooses [facility]. CSW identified weekend covering rep Amy in [place], sent [place] and she agreed to connect with family.  No further ICM needs at this time.    Expected Discharge Plan: Skilled Nursing Facility Barriers to Discharge: Continued Medical Work up               Expected Discharge Plan and Services In-house Referral: Clinical Social Work     Living arrangements for the past 2 months: Independent Living Facility                 DME Arranged: N/A DME Agency: NA                   Social Drivers of Health (SDOH) Interventions SDOH Screenings   Food Insecurity: No Food Insecurity (01/13/2024)  Housing: Low Risk (01/13/2024)  Transportation Needs: No Transportation Needs (01/13/2024)  Utilities: Not At Risk (01/13/2024)  Alcohol  Screen: Low Risk (11/25/2023)  Depression (PHQ2-9): Low Risk (11/25/2023)  Financial Resource Strain: Low Risk (11/25/2023)  Physical Activity: Inactive (11/25/2023)  Social Connections: Socially Isolated (01/13/2024)  Stress: No Stress Concern Present (11/25/2023)  Tobacco Use: Medium Risk (01/13/2024)  Health Literacy: Adequate Health Literacy (11/25/2023)    Readmission Risk Interventions     No data to display

## 2024-01-16 DIAGNOSIS — S32030D Wedge compression fracture of third lumbar vertebra, subsequent encounter for fracture with routine healing: Secondary | ICD-10-CM | POA: Diagnosis not present

## 2024-01-16 NOTE — Progress Notes (Signed)
 " PROGRESS NOTE    Victoria Long  FMW:[id number] DOB: 1929/01/17 DOA: 01/13/2024 PCP: Ozell Heron HERO, MD    Brief Narrative:   89 year old with history of bilateral knee osteoarthritis, previously treated breast cancer and known pancreatic mass, prediabetes, HLD had a recent hospital stay for acute L1 compression fracture status post IR kyphoplasty who is admitted with acute L3 anterior inferior chip fracture from a mechanical fall after facility.  Neurosurgery reviewed the case and recommended TLSO brace for comfort otherwise no other surgical indication.  PT recommended SNF placement and therefore TOC consulted.  Assessment & Plan:   Acute L3 compression fracture - This is secondary to mechanical fall.  Seen by neurosurgery recommending TLSO brace and pain control otherwise no other interventions planned -PT/OT recommending SNF, consulted TOC to reach out to hospice as family is interested in discussing possible future  Depression - Zoloft   GERD - Pepcid   Essential hypertension -Currently on ARB.  IV as needed  Bilateral knee osteoarthritis -Chronic pain control.  Anemia of chronic disease -Hemoglobin around baseline of 12  Recommend palliative team to follow patient outpatient  DVT prophylaxis: Lovenox      Code Status: Limited: Do not attempt resuscitation (DNR) -DNR-LIMITED -Do Not Intubate/DNI  Family Communication: Family up-to-date Awaiting safe disposition  PT Follow up Recs: SNF  Subjective: Sitting in the recliner, drowsy no other complaints  Examination:  General exam: Appears calm and comfortable, elderly frail Respiratory system: Clear to auscultation. Respiratory effort normal. Cardiovascular system: S1 & S2 heard, RRR. No JVD, murmurs, rubs, gallops or clicks. No pedal edema. Gastrointestinal system: Abdomen is nondistended, soft and nontender. No organomegaly or masses felt. Normal bowel sounds heard. Central nervous system: Alert and  oriented.  The perform full neuroexam she was quite asleep after having a long night Extremities: Symmetric 5 x 5 power. Skin: No rashes, lesions or ulcers Psychiatry: Difficult to assess                Diet Orders (From admission, onward)     Start     Ordered   01/13/24 1407  Diet regular Room service appropriate? Yes; Fluid consistency: Thin  Diet effective now       Question Answer Comment  Room service appropriate? Yes   Fluid consistency: Thin      01/13/24 1406            Objective: Vitals:   01/15/24 0800 01/15/24 1351 01/15/24 2107 01/16/24 0601  BP:  107/61 (!) 119/56 108/89  Pulse:  83 72 79  Resp:   15 16  Temp:  97.9 F (36.6 C) 97.9 F (36.6 C) 98.7 F (37.1 C)  TempSrc:  Oral Oral Oral  SpO2:  98% 97% 98%  Weight: 48.1 kg     Height: 5' 1.5 (1.562 m)       Intake/Output Summary (Last 24 hours) at 01/16/2024 1101 Last data filed at 01/16/2024 0908 Gross per 24 hour  Intake 420 ml  Output --  Net 420 ml   Filed Weights   01/15/24 0800  Weight: 48.1 kg    Scheduled Meds:  acetaminophen   1,000 mg Oral TID   diclofenac  Sodium  2 g Topical QID   enoxaparin  (LOVENOX ) injection  30 mg Subcutaneous Q24H   famotidine   20 mg Oral QHS   hydrOXYzine   25 mg Oral QHS   irbesartan   150 mg Oral Daily   ketorolac   15 mg Intravenous Q8H   sertraline   50 mg Oral Daily  Continuous Infusions:  Nutritional status     Body mass index is 19.7 kg/m.  Data Reviewed:   CBC: Recent Labs  Lab 01/13/24 1315 01/14/24 0330  WBC 10.3 7.4  HGB 13.0 11.1*  HCT 38.9 33.2*  MCV 97.5 96.5  PLT 290 260   Basic Metabolic Panel: Recent Labs  Lab 01/13/24 1315 01/14/24 0330  NA 141 141  K 3.9 3.9  CL 104 108  CO2 25 25  GLUCOSE 102* 104*  BUN 14 22  CREATININE 0.54 0.63  CALCIUM  10.0 9.4   GFR: Estimated Creatinine Clearance: 33.4 mL/min (by C-G formula based on SCr of 0.63 mg/dL). Liver Function Tests: No results for input(s): AST,  ALT, ALKPHOS, BILITOT, PROT, ALBUMIN in the last 168 hours. No results for input(s): LIPASE, AMYLASE in the last 168 hours. No results for input(s): AMMONIA in the last 168 hours. Coagulation Profile: No results for input(s): INR, PROTIME in the last 168 hours. Cardiac Enzymes: No results for input(s): CKTOTAL, CKMB, CKMBINDEX, TROPONINI in the last 168 hours. BNP (last 3 results) No results for input(s): PROBNP in the last 8760 hours. HbA1C: No results for input(s): HGBA1C in the last 72 hours. CBG: No results for input(s): GLUCAP in the last 168 hours. Lipid Profile: No results for input(s): CHOL, HDL, LDLCALC, TRIG, CHOLHDL, LDLDIRECT in the last 72 hours. Thyroid  Function Tests: No results for input(s): TSH, T4TOTAL, FREET4, T3FREE, THYROIDAB in the last 72 hours. Anemia Panel: No results for input(s): VITAMINB12, FOLATE, FERRITIN, TIBC, IRON, RETICCTPCT in the last 72 hours. Sepsis Labs: No results for input(s): PROCALCITON, LATICACIDVEN in the last 168 hours.  No results found for this or any previous visit (from the past 240 hours).       Radiology Studies: No results found.         LOS: 2 days   Time spent= 35 mins    Burgess JAYSON Dare, MD [facility]  If 7PM-7AM, please contact night-coverage  01/16/2024, 11:01 AM  "

## 2024-01-16 NOTE — Progress Notes (Signed)
 Physical Therapy Treatment Patient Details Name: Victoria Long MRN: [id number] DOB: Jul 05, 1928 Today's Date: 01/16/2024   History of Present Illness 89 year old presented to [facility] after fall at ILF. CT revealed  L3 compression fx. Pt also with R forearm laceration requiring sutures. Admitted for pain control.  PMH: T6 and L1 compression fractures, S/P L1 kyphoplasty 12/10/23, bilateral knee osteoarthritis, stage Ia bilateral breast cancer s/p lumpectomy and radiations, pancreatic mass, prediabetes, vitamin D  deficiency and hyperlipidemia    PT Comments  PT - Cognition Comments: AxO x 1 only.  Requiring repeat, simple commands.  Assisted to bathroom. Incont. Attempting to stand without assist despite repeat instructions to wait (Therapist in bathroom)  When returned to recliner, Pt attempting to get up stated she was leaving.  I'm going home.  My Son is coming to pick me up. Attempted to apply TLSO.  Pt kept trying to remove.  Pt unable to tolerate.  Assisted with amb to the bathroom as Pt was incont urine.  Assisted with peri care.  General transfer comment: VERY unsteady with poor self corrections, poor safety awareness.  Confused.  HIGH FALL RISK. General Gait Details: assist to steady and manage RW. cues for proximity to RW, posture. bil LEs shaky/unsteadiness.  Amb to and from bathroom. Assisted back to recliner.  Positioned to comfort with multiple pillows.   Pt kept trying to get up.  Left Pt at Nurses station and she did better. LPT has rec Pt will need ST Rehab at SNF to address mobility and functional decline prior to safely returning home.    If plan is discharge home, recommend the following: A little help with walking and/or transfers;A little help with bathing/dressing/bathroom;Help with stairs or ramp for entrance;Assist for transportation;Assistance with cooking/housework   Can travel by private vehicle     No  Equipment Recommendations  None recommended by PT     Recommendations for Other Services       Precautions / Restrictions Precautions Precautions: Fall;Back Precaution/Restrictions Comments: back precautions for comfort Restrictions Weight Bearing Restrictions Per Provider Order: No Other Position/Activity Restrictions: TLSO for comfort (Pt unable to tolerate) confused.  Keeps trying to remove.     Mobility  Bed Mobility               General bed mobility comments: OOB in recliner    Transfers Overall transfer level: Needs assistance Equipment used: Rolling walker (2 wheels) Transfers: Sit to/from Stand Sit to Stand: Min assist, Mod assist Stand pivot transfers: Mod assist         General transfer comment: VERY unsteady with poor self corrections, poor safety awareness.  Confused.  HIGH FALL RISK.    Ambulation/Gait Ambulation/Gait assistance: Min assist, Mod assist Gait Distance (Feet): 24 Feet (12 feet x 2) Assistive device: Rolling walker (2 wheels) Gait Pattern/deviations: Step-to pattern, Decreased step length - right, Decreased step length - left Gait velocity: decreased     General Gait Details: assist to steady and manage RW. cues for proximity to RW, posture. bil LEs shaky/unsteadiness.  Amb to and from bathroom.   Stairs             Wheelchair Mobility     Tilt Bed    Modified Rankin (Stroke Patients Only)       Balance  Communication    Cognition Arousal: Alert Behavior During Therapy: Restless   PT - Cognitive impairments: Problem solving, Orientation, Safety/Judgement                       PT - Cognition Comments: AxO x 1 only.  Requiring repeat, simple commands.  Assisted to bathroom. Incont. Attempting to stand without assist despite repeat instructions to wait (Therapist in bathroom)  When returned to recliner, Pt attempting to get up stated she was leaving.  I'm going home.  My Son is coming to pick  me up.        Cueing    Exercises      General Comments        Pertinent Vitals/Pain Pain Assessment Pain Assessment: No/denies pain    Home Living                          Prior Function            PT Goals (current goals can now be found in the care plan section) Progress towards PT goals: Progressing toward goals    Frequency    Min 3X/week      PT Plan      Co-evaluation              AM-PAC PT 6 Clicks Mobility   Outcome Measure  Help needed turning from your back to your side while in a flat bed without using bedrails?: A Little Help needed moving from lying on your back to sitting on the side of a flat bed without using bedrails?: A Little Help needed moving to and from a bed to a chair (including a wheelchair)?: A Little Help needed standing up from a chair using your arms (e.g., wheelchair or bedside chair)?: A Little Help needed to walk in hospital room?: A Lot Help needed climbing 3-5 steps with a railing? : A Lot 6 Click Score: 16    End of Session Equipment Utilized During Treatment: Gait belt Activity Tolerance: Patient tolerated treatment well Patient left: in chair;with call bell/phone within reach;Other (comment) (left Pt sitting at the nurses station) Nurse Communication: Mobility status PT Visit Diagnosis: Other abnormalities of gait and mobility (R26.89)     Time: 8453-8384 PT Time Calculation (min) (ACUTE ONLY): 29 min  Charges:    $Gait Training: 8-22 mins $Therapeutic Activity: 8-22 mins PT General Charges $$ ACUTE PT VISIT: 1 Visit                     {Alferd Obryant  PTA Acute  Rehabilitation Services Office M-F          [phone]

## 2024-01-16 NOTE — Progress Notes (Signed)
 The patient pulled out her IV at shift change and is refusing starting another one.

## 2024-01-16 NOTE — TOC Progression Note (Signed)
 Transition of Care [facility]) - Progression Note    Patient Details  Name: Victoria Long MRN: [id number] Date of Birth: [birth year]  [facility]) CM/SW Contact  NORMAN ASPEN, LCSW Phone Number: 01/16/2024, 2:06 PM  Clinical Narrative:     Have provided bed offers to patient's son, Tom Diachenko and he plans to visit facilities today and let CSW covering tomorrow know choice.  (Family preferred [facility] SNF, however, they have declined due to pt had behavioral issues there with prior stay.  Son aware.)  Will alert weekend coverage to please follow up with son tomorrow and then start insurance auth.     Expected Discharge Plan: Skilled Nursing Facility Barriers to Discharge: Continued Medical Work up               Expected Discharge Plan and Services In-house Referral: Clinical Social Work     Living arrangements for the past 2 months: Independent Living Facility                 DME Arranged: N/A DME Agency: NA                   Social Drivers of Health (SDOH) Interventions SDOH Screenings   Food Insecurity: No Food Insecurity (01/13/2024)  Housing: Low Risk (01/13/2024)  Transportation Needs: No Transportation Needs (01/13/2024)  Utilities: Not At Risk (01/13/2024)  Alcohol  Screen: Low Risk (11/25/2023)  Depression (PHQ2-9): Low Risk (11/25/2023)  Financial Resource Strain: Low Risk (11/25/2023)  Physical Activity: Inactive (11/25/2023)  Social Connections: Socially Isolated (01/13/2024)  Stress: No Stress Concern Present (11/25/2023)  Tobacco Use: Medium Risk (01/13/2024)  Health Literacy: Adequate Health Literacy (11/25/2023)    Readmission Risk Interventions    01/16/2024    2:06 PM  Readmission Risk Prevention Plan  Post Dischage Appt Complete  Medication Screening Complete  Transportation Screening Complete

## 2024-01-17 MED ORDER — HALOPERIDOL LACTATE 5 MG/ML IJ SOLN
1.0000 mg | Freq: Once | INTRAMUSCULAR | Status: AC
  Administered 2024-01-17: 1 mg via INTRAVENOUS
  Filled 2024-01-17: qty 1

## 2024-01-17 NOTE — Progress Notes (Signed)
 The patient is confused, attempting to get out of bed, and attempting to hit staff. Messaged Lavanda Horns for Ativan or equivalet.

## 2024-01-17 NOTE — Plan of Care (Signed)
   Problem: Clinical Measurements: Goal: Diagnostic test results will improve Outcome: Progressing

## 2024-01-17 NOTE — Progress Notes (Signed)
 " PROGRESS NOTE    Victoria Long  FMW:[id number] DOB: July 05, 1928 DOA: 01/13/2024 PCP: Ozell Heron HERO, MD    Brief Narrative:   89 year old with history of bilateral knee osteoarthritis, previously treated breast cancer and known pancreatic mass, prediabetes, HLD had a recent hospital stay for acute L1 compression fracture status post IR kyphoplasty who is admitted with acute L3 anterior inferior chip fracture from a mechanical fall after facility.  Neurosurgery reviewed the case and recommended TLSO brace for comfort otherwise no other surgical indication.  PT recommended SNF placement and therefore TOC consulted.  We are currently awaiting placement.  Assessment & Plan:   Acute L3 compression fracture - This is secondary to mechanical fall.  Seen by neurosurgery recommending TLSO brace and pain control otherwise no other interventions planned -PT/OT recommending SNF, would benefit from palliative care team to follow the patient at rehab facility  Depression - Zoloft   GERD - Pepcid   Essential hypertension -Currently on ARB.  IV as needed  Bilateral knee osteoarthritis -Chronic pain control.  Anemia of chronic disease -Hemoglobin around baseline of 12  Recommend palliative team to follow patient outpatient DNR form signed in the chart  DVT prophylaxis: Lovenox      Code Status: Limited: Do not attempt resuscitation (DNR) -DNR-LIMITED -Do Not Intubate/DNI  Family Communication: Family up-to-date Awaiting safe disposition  PT Follow up Recs: SNF  Subjective: Resting comfortably, family is at bedside.  Examination:  General exam: Appears calm and comfortable, elderly frail Respiratory system: Clear to auscultation. Respiratory effort normal. Cardiovascular system: S1 & S2 heard, RRR. No JVD, murmurs, rubs, gallops or clicks. No pedal edema. Gastrointestinal system: Abdomen is nondistended, soft and nontender. No organomegaly or masses felt. Normal bowel  sounds heard. Central nervous system: Alert and oriented.  The perform full neuroexam she was quite asleep after having a long night Extremities: Symmetric 5 x 5 power. Skin: No rashes, lesions or ulcers Psychiatry: Difficult to assess                Diet Orders (From admission, onward)     Start     Ordered   01/13/24 1407  Diet regular Room service appropriate? Yes; Fluid consistency: Thin  Diet effective now       Question Answer Comment  Room service appropriate? Yes   Fluid consistency: Thin      01/13/24 1406            Objective: Vitals:   01/15/24 2107 01/16/24 0601 01/16/24 1358 01/16/24 2223  BP: (!) 119/56 108/89 (!) 146/68 (!) 173/70  Pulse: 72 79 91 94  Resp: 15 16 18 18   Temp: 97.9 F (36.6 C) 98.7 F (37.1 C) 98.3 F (36.8 C) 97.9 F (36.6 C)  TempSrc: Oral Oral  Oral  SpO2: 97% 98% 92% 97%  Weight:      Height:        Intake/Output Summary (Last 24 hours) at 01/17/2024 1040 Last data filed at 01/17/2024 0835 Gross per 24 hour  Intake 140 ml  Output --  Net 140 ml   Filed Weights   01/15/24 0800  Weight: 48.1 kg    Scheduled Meds:  acetaminophen   1,000 mg Oral TID   diclofenac  Sodium  2 g Topical QID   enoxaparin  (LOVENOX ) injection  30 mg Subcutaneous Q24H   famotidine   20 mg Oral QHS   hydrOXYzine   25 mg Oral QHS   irbesartan   150 mg Oral Daily   ketorolac   15 mg Intravenous  Q8H   sertraline   50 mg Oral Daily   Continuous Infusions:  Nutritional status     Body mass index is 19.7 kg/m.  Data Reviewed:   CBC: Recent Labs  Lab 01/13/24 1315 01/14/24 0330  WBC 10.3 7.4  HGB 13.0 11.1*  HCT 38.9 33.2*  MCV 97.5 96.5  PLT 290 260   Basic Metabolic Panel: Recent Labs  Lab 01/13/24 1315 01/14/24 0330  NA 141 141  K 3.9 3.9  CL 104 108  CO2 25 25  GLUCOSE 102* 104*  BUN 14 22  CREATININE 0.54 0.63  CALCIUM  10.0 9.4   GFR: Estimated Creatinine Clearance: 33.4 mL/min (by C-G formula based on SCr of  0.63 mg/dL). Liver Function Tests: No results for input(s): AST, ALT, ALKPHOS, BILITOT, PROT, ALBUMIN in the last 168 hours. No results for input(s): LIPASE, AMYLASE in the last 168 hours. No results for input(s): AMMONIA in the last 168 hours. Coagulation Profile: No results for input(s): INR, PROTIME in the last 168 hours. Cardiac Enzymes: No results for input(s): CKTOTAL, CKMB, CKMBINDEX, TROPONINI in the last 168 hours. BNP (last 3 results) No results for input(s): PROBNP in the last 8760 hours. HbA1C: No results for input(s): HGBA1C in the last 72 hours. CBG: No results for input(s): GLUCAP in the last 168 hours. Lipid Profile: No results for input(s): CHOL, HDL, LDLCALC, TRIG, CHOLHDL, LDLDIRECT in the last 72 hours. Thyroid  Function Tests: No results for input(s): TSH, T4TOTAL, FREET4, T3FREE, THYROIDAB in the last 72 hours. Anemia Panel: No results for input(s): VITAMINB12, FOLATE, FERRITIN, TIBC, IRON, RETICCTPCT in the last 72 hours. Sepsis Labs: No results for input(s): PROCALCITON, LATICACIDVEN in the last 168 hours.  No results found for this or any previous visit (from the past 240 hours).       Radiology Studies: No results found.         LOS: 3 days   Time spent= 35 mins    Burgess JAYSON Dare, MD [facility]  If 7PM-7AM, please contact night-coverage  01/17/2024, 10:40 AM  "

## 2024-01-18 DIAGNOSIS — S32030A Wedge compression fracture of third lumbar vertebra, initial encounter for closed fracture: Secondary | ICD-10-CM | POA: Diagnosis not present

## 2024-01-18 MED ORDER — BISACODYL 10 MG RE SUPP
10.0000 mg | Freq: Once | RECTAL | Status: AC
  Administered 2024-01-18: 10 mg via RECTAL
  Filled 2024-01-18: qty 1

## 2024-01-18 MED ORDER — SENNOSIDES-DOCUSATE SODIUM 8.6-50 MG PO TABS
1.0000 | ORAL_TABLET | Freq: Two times a day (BID) | ORAL | Status: DC
  Administered 2024-01-18 – 2024-01-21 (×6): 1 via ORAL
  Filled 2024-01-18 (×2): qty 1

## 2024-01-18 MED ORDER — POLYETHYLENE GLYCOL 3350 17 G PO PACK
17.0000 g | PACK | Freq: Every day | ORAL | Status: DC
  Administered 2024-01-18 – 2024-01-20 (×2): 17 g via ORAL
  Filled 2024-01-18: qty 1

## 2024-01-18 NOTE — Progress Notes (Signed)
 " PROGRESS NOTE  Victoria Long  FMW:[id number] DOB: 1928/06/07 DOA: 01/13/2024 PCP: Ozell Heron HERO, MD   Brief Narrative: 89 year old with history of bilateral knee osteoarthritis, previously treated breast cancer and known pancreatic mass, prediabetes, HLD had a recent hospital stay for acute L1 compression fracture status post IR kyphoplasty who is admitted with acute L3 anterior inferior chip fracture from a mechanical fall after facility. Neurosurgery reviewed the case and recommended TLSO brace for comfort otherwise no other surgical indication. PT recommended SNF placement and therefore TOC consulted. We are currently awaiting placement.  Medically stable for discharge whenever possible.  Assessment & Plan:  Principal Problem:   Closed compression fracture of L3 vertebra (HCC)  Acute L3 compression fracture - This is secondary to mechanical fall.  Seen by neurosurgery recommending TLSO brace and pain control otherwise no other interventions planned -PT/OT recommending SNF, would benefit from palliative care team to follow the patient at rehab facility   Depression - Zoloft    GERD - Pepcid    Essential hypertension -Currently on ARB.  IV as needed   Bilateral knee osteoarthritis -Chronic pain control.   Anemia of chronic disease -Hemoglobin around baseline of 12  Memory issues Intermittently confused.  Continue delirium precaution, frequent orientation.  Not agitated           DVT prophylaxis:enoxaparin  (LOVENOX ) injection 30 mg Start: 01/13/24 2200     Code Status: Limited: Do not attempt resuscitation (DNR) -DNR-LIMITED -Do Not Intubate/DNI   Family Communication: Discussed with son at bedside on 12/28  Patient status:Inpatient  Patient is from :Home  Anticipated discharge to:SNF  Estimated DC date:whenever possible   Consultants: Neurosurgery  Procedures:None  Antimicrobials:  Anti-infectives (From admission, onward)    None        Subjective: Patient seen and examined at bedside today.  She was sitting on the potty.  Having bowel movement.  Appears overall comfortable.  Not in any kind of distress.  Complains of some back pain.  Son at bedside.  Objective: Vitals:   01/16/24 2223 01/17/24 1346 01/18/24 0023 01/18/24 0614  BP: (!) 173/70 (!) 140/57 (!) 126/49 (!) 141/65  Pulse: 94 78 62 78  Resp: 18 18 18 18   Temp: 97.9 F (36.6 C) 98.1 F (36.7 C) 98.3 F (36.8 C) 98 F (36.7 C)  TempSrc: Oral Oral  Oral  SpO2: 97% 99% 97% 98%  Weight:      Height:        Intake/Output Summary (Last 24 hours) at 01/18/2024 1209 Last data filed at 01/18/2024 0600 Gross per 24 hour  Intake 800 ml  Output 0 ml  Net 800 ml   Filed Weights   01/15/24 0800  Weight: 48.1 kg    Examination:  General exam: Overall comfortable, not in distress HEENT: PERRL Respiratory system:  no wheezes or crackles  Cardiovascular system: S1 & S2 heard, RRR.  Gastrointestinal system: Abdomen is nondistended, soft and nontender. Central nervous system: Alert and awake, not agitated Extremities: No edema, no clubbing ,no cyanosis Skin: No rashes, no ulcers,no icterus     Data Reviewed: I have personally reviewed following labs and imaging studies  CBC: Recent Labs  Lab 01/13/24 1315 01/14/24 0330  WBC 10.3 7.4  HGB 13.0 11.1*  HCT 38.9 33.2*  MCV 97.5 96.5  PLT 290 260   Basic Metabolic Panel: Recent Labs  Lab 01/13/24 1315 01/14/24 0330  NA 141 141  K 3.9 3.9  CL 104 108  CO2 25 25  GLUCOSE 102* 104*  BUN 14 22  CREATININE 0.54 0.63  CALCIUM  10.0 9.4     No results found for this or any previous visit (from the past 240 hours).   Radiology Studies: No results found.  Scheduled Meds:  acetaminophen   1,000 mg Oral TID   bisacodyl   10 mg Rectal Once   diclofenac  Sodium  2 g Topical QID   enoxaparin  (LOVENOX ) injection  30 mg Subcutaneous Q24H   famotidine   20 mg Oral QHS   hydrOXYzine   25 mg Oral QHS    irbesartan   150 mg Oral Daily   ketorolac   15 mg Intravenous Q8H   polyethylene glycol  17 g Oral Daily   senna-docusate  1 tablet Oral BID   sertraline   50 mg Oral Daily   Continuous Infusions:   LOS: 4 days   Ivonne Mustache, MD [facility] P12/28/2025, 12:09 PM  PROGRESS NOTE  Victoria Long  FMW:[id number] DOB: 1928/05/09 DOA: 01/13/2024 PCP: Ozell Heron HERO, MD   Brief Narrative:   Assessment & Plan:  Principal Problem:   Closed compression fracture of L3 vertebra (HCC)           DVT prophylaxis:enoxaparin  (LOVENOX ) injection 30 mg Start: 01/13/24 2200     Code Status: Limited: Do not attempt resuscitation (DNR) -DNR-LIMITED -Do Not Intubate/DNI   Family Communication:   Patient status:  Patient is from :  Anticipated discharge to:  Estimated DC date:   Consultants:   Procedures:  Antimicrobials:  Anti-infectives (From admission, onward)    None       Subjective:   Objective: Vitals:   01/16/24 2223 01/17/24 1346 01/18/24 0023 01/18/24 0614  BP: (!) 173/70 (!) 140/57 (!) 126/49 (!) 141/65  Pulse: 94 78 62 78  Resp: 18 18 18 18   Temp: 97.9 F (36.6 C) 98.1 F (36.7 C) 98.3 F (36.8 C) 98 F (36.7 C)  TempSrc: Oral Oral  Oral  SpO2: 97% 99% 97% 98%  Weight:      Height:        Intake/Output Summary (Last 24 hours) at 01/18/2024 1210 Last data filed at 01/18/2024 0600 Gross per 24 hour  Intake 800 ml  Output 0 ml  Net 800 ml   Filed Weights   01/15/24 0800  Weight: 48.1 kg    Examination:  General exam: Overall comfortable, not in distress HEENT: PERRL Respiratory system:  no wheezes or crackles  Cardiovascular system: S1 & S2 heard, RRR.  Gastrointestinal system: Abdomen is nondistended, soft and nontender. Central nervous system: Alert and oriented Extremities: No edema, no clubbing ,no cyanosis Skin: No rashes, no ulcers,no icterus     Data Reviewed: I have personally reviewed following labs  and imaging studies  CBC: Recent Labs  Lab 01/13/24 1315 01/14/24 0330  WBC 10.3 7.4  HGB 13.0 11.1*  HCT 38.9 33.2*  MCV 97.5 96.5  PLT 290 260   Basic Metabolic Panel: Recent Labs  Lab 01/13/24 1315 01/14/24 0330  NA 141 141  K 3.9 3.9  CL 104 108  CO2 25 25  GLUCOSE 102* 104*  BUN 14 22  CREATININE 0.54 0.63  CALCIUM  10.0 9.4     No results found for this or any previous visit (from the past 240 hours).   Radiology Studies: No results found.  Scheduled Meds:  acetaminophen   1,000 mg Oral TID   bisacodyl   10 mg Rectal Once   diclofenac  Sodium  2 g Topical QID   enoxaparin  (LOVENOX )  injection  30 mg Subcutaneous Q24H   famotidine   20 mg Oral QHS   hydrOXYzine   25 mg Oral QHS   irbesartan   150 mg Oral Daily   ketorolac   15 mg Intravenous Q8H   polyethylene glycol  17 g Oral Daily   senna-docusate  1 tablet Oral BID   sertraline   50 mg Oral Daily   Continuous Infusions:   LOS: 4 days   Ivonne Mustache, MD [facility] P12/28/2025, 12:10 PM  "

## 2024-01-18 NOTE — Plan of Care (Signed)
   Problem: Clinical Measurements: Goal: Cardiovascular complication will be avoided Outcome: Progressing

## 2024-01-18 NOTE — TOC Progression Note (Signed)
 Transition of Care [facility]) - Progression Note    Patient Details  Name: Victoria Long MRN: [id number] Date of Birth: Nov 15, 1928  [facility]) CM/SW Contact  Heather DELENA Saltness, LCSW Phone Number: 01/18/2024, 10:17 AM  Clinical Narrative:    CSW spoke with pt's son, Debby Jeronimo [phone], via phone call to discuss SNF bed availability. Pt's son choosing between [facility] or [facility]. Pt's son reports [facility] and [facility] are preferred facilities. Pt's son reports he spoke with Randine at [facility] and [place] at [place], who both report bed availability this week. CSW reached out to [facility]. TOC will await follow up.   Expected Discharge Plan: Skilled Nursing Facility Barriers to Discharge: Continued Medical Work up   Expected Discharge Plan and Services In-house Referral: Clinical Social Work     Living arrangements for the past 2 months: Independent Living Facility                 DME Arranged: N/A DME Agency: NA    Social Drivers of Health (SDOH) Interventions SDOH Screenings   Food Insecurity: No Food Insecurity (01/13/2024)  Housing: Low Risk (01/13/2024)  Transportation Needs: No Transportation Needs (01/13/2024)  Utilities: Not At Risk (01/13/2024)  Alcohol  Screen: Low Risk (11/25/2023)  Depression (PHQ2-9): Low Risk (11/25/2023)  Financial Resource Strain: Low Risk (11/25/2023)  Physical Activity: Inactive (11/25/2023)  Social Connections: Socially Isolated (01/13/2024)  Stress: No Stress Concern Present (11/25/2023)  Tobacco Use: Medium Risk (01/13/2024)  Health Literacy: Adequate Health Literacy (11/25/2023)    Readmission Risk Interventions    01/16/2024    2:06 PM  Readmission Risk Prevention Plan  Post Dischage Appt Complete  Medication Screening Complete  Transportation Screening Complete    Signed: Heather Saltness, MSW, LCSW Clinical Social Worker Inpatient Care Management 01/18/2024 10:22 AM

## 2024-01-19 DIAGNOSIS — S32030A Wedge compression fracture of third lumbar vertebra, initial encounter for closed fracture: Secondary | ICD-10-CM | POA: Diagnosis not present

## 2024-01-19 MED ORDER — ALPRAZOLAM 0.25 MG PO TABS
0.2500 mg | ORAL_TABLET | Freq: Two times a day (BID) | ORAL | Status: DC | PRN
  Administered 2024-01-19 – 2024-01-20 (×3): 0.25 mg via ORAL
  Filled 2024-01-19 (×3): qty 1

## 2024-01-19 MED ORDER — WITCH HAZEL-GLYCERIN EX PADS
MEDICATED_PAD | CUTANEOUS | Status: DC | PRN
  Administered 2024-01-19: 1 via TOPICAL
  Filled 2024-01-19: qty 100

## 2024-01-19 NOTE — Progress Notes (Signed)
 Patient c/o rectal itching/discomfort and assessed with hemorrhoids noted. Hemorrhoid medication (Witch Hazel/Tucks Pads ordered per standing order:

## 2024-01-19 NOTE — Progress Notes (Signed)
 " PROGRESS NOTE  Victoria Long  FMW:[id number] DOB: Aug 31, 1928 DOA: 01/13/2024 PCP: Ozell Heron HERO, MD   Brief Narrative: 89 year old with history of bilateral knee osteoarthritis, previously treated breast cancer and known pancreatic mass, prediabetes, HLD had a recent hospital stay for acute L1 compression fracture status post IR kyphoplasty who is admitted with acute L3 anterior inferior chip fracture from a mechanical fall after facility. Neurosurgery reviewed the case and recommended TLSO brace for comfort otherwise no other surgical indication. PT recommended SNF placement and therefore TOC consulted. We are currently awaiting placement.  Medically stable for discharge whenever possible.  Assessment & Plan:  Principal Problem:   Closed compression fracture of L3 vertebra (HCC)  Acute L3 compression fracture - This is secondary to mechanical fall.  Seen by neurosurgery recommending TLSO brace and pain control otherwise no other interventions planned -PT/OT recommending SNF, would benefit from palliative care team to follow the patient at rehab facility   Depression - Zoloft    GERD - Pepcid    Essential hypertension -Currently on ARB.  IV as needed   Bilateral knee osteoarthritis -Chronic pain control.   Anemia of chronic disease -Hemoglobin around baseline of 12  Memory issues Intermittently confused.  Continue delirium precaution, frequent orientation.  Not agitated           DVT prophylaxis:enoxaparin  (LOVENOX ) injection 30 mg Start: 01/13/24 2200     Code Status: Limited: Do not attempt resuscitation (DNR) -DNR-LIMITED -Do Not Intubate/DNI   Family Communication: Discussed with son at bedside on 12/28  Patient status:Inpatient  Patient is from :Home  Anticipated discharge to:SNF  Estimated DC date:whenever possible   Consultants: Neurosurgery  Procedures:None  Antimicrobials:  Anti-infectives (From admission, onward)    None        Subjective: Patient seen and examined at bedside today.  Comfortable lying on bed.  No new issues.  Back pain well-controlled.  Not in any acute distress..  Objective: Vitals:   01/18/24 0614 01/18/24 1359 01/18/24 2200 01/19/24 0452  BP: (!) 141/65 130/68 (!) 151/64 (!) 160/80  Pulse: 78 81 84 79  Resp: 18 16 14 18   Temp: 98 F (36.7 C) 98.4 F (36.9 C) 98.2 F (36.8 C) 97.7 F (36.5 C)  TempSrc: Oral Oral  Oral  SpO2: 98% 100% 100% 98%  Weight:      Height:        Intake/Output Summary (Last 24 hours) at 01/19/2024 1115 Last data filed at 01/19/2024 0810 Gross per 24 hour  Intake 840 ml  Output --  Net 840 ml   Filed Weights   01/15/24 0800  Weight: 48.1 kg    Examination:  General exam: Overall comfortable, not in distress HEENT: PERRL Respiratory system:  no wheezes or crackles  Cardiovascular system: S1 & S2 heard, RRR.  Gastrointestinal system: Abdomen is nondistended, soft and nontender. Central nervous system: Alert and awake, confused Extremities: No edema, no clubbing ,no cyanosis Skin: No rashes, no ulcers,no icterus       Data Reviewed: I have personally reviewed following labs and imaging studies  CBC: Recent Labs  Lab 01/13/24 1315 01/14/24 0330  WBC 10.3 7.4  HGB 13.0 11.1*  HCT 38.9 33.2*  MCV 97.5 96.5  PLT 290 260   Basic Metabolic Panel: Recent Labs  Lab 01/13/24 1315 01/14/24 0330  NA 141 141  K 3.9 3.9  CL 104 108  CO2 25 25  GLUCOSE 102* 104*  BUN 14 22  CREATININE 0.54 0.63  CALCIUM  10.0  9.4     No results found for this or any previous visit (from the past 240 hours).   Radiology Studies: No results found.  Scheduled Meds:  acetaminophen   1,000 mg Oral TID   diclofenac  Sodium  2 g Topical QID   enoxaparin  (LOVENOX ) injection  30 mg Subcutaneous Q24H   famotidine   20 mg Oral QHS   hydrOXYzine   25 mg Oral QHS   irbesartan   150 mg Oral Daily   polyethylene glycol  17 g Oral Daily   senna-docusate  1  tablet Oral BID   sertraline   50 mg Oral Daily   Continuous Infusions:   LOS: 5 days   Ivonne Mustache, MD [facility] P12/29/2025, 11:15 AM  PROGRESS NOTE  Victoria Long  FMW:[id number] DOB: [birth year] DOA: 01/13/2024 PCP: Ozell Heron HERO, MD   Brief Narrative:   Assessment & Plan:  Principal Problem:   Closed compression fracture of L3 vertebra (HCC)           DVT prophylaxis:enoxaparin  (LOVENOX ) injection 30 mg Start: 01/13/24 2200     Code Status: Limited: Do not attempt resuscitation (DNR) -DNR-LIMITED -Do Not Intubate/DNI   Family Communication:   Patient status:  Patient is from :  Anticipated discharge to:  Estimated DC date:   Consultants:   Procedures:  Antimicrobials:  Anti-infectives (From admission, onward)    None       Subjective:   Objective: Vitals:   01/18/24 0614 01/18/24 1359 01/18/24 2200 01/19/24 0452  BP: (!) 141/65 130/68 (!) 151/64 (!) 160/80  Pulse: 78 81 84 79  Resp: 18 16 14 18   Temp: 98 F (36.7 C) 98.4 F (36.9 C) 98.2 F (36.8 C) 97.7 F (36.5 C)  TempSrc: Oral Oral  Oral  SpO2: 98% 100% 100% 98%  Weight:      Height:        Intake/Output Summary (Last 24 hours) at 01/19/2024 1115 Last data filed at 01/19/2024 0810 Gross per 24 hour  Intake 840 ml  Output --  Net 840 ml   Filed Weights   01/15/24 0800  Weight: 48.1 kg    Examination:  General exam: Overall comfortable, not in distress HEENT: PERRL Respiratory system:  no wheezes or crackles  Cardiovascular system: S1 & S2 heard, RRR.  Gastrointestinal system: Abdomen is nondistended, soft and nontender. Central nervous system: Alert and oriented Extremities: No edema, no clubbing ,no cyanosis Skin: No rashes, no ulcers,no icterus     Data Reviewed: I have personally reviewed following labs and imaging studies  CBC: Recent Labs  Lab 01/13/24 1315 01/14/24 0330  WBC 10.3 7.4  HGB 13.0 11.1*  HCT 38.9 33.2*  MCV  97.5 96.5  PLT 290 260   Basic Metabolic Panel: Recent Labs  Lab 01/13/24 1315 01/14/24 0330  NA 141 141  K 3.9 3.9  CL 104 108  CO2 25 25  GLUCOSE 102* 104*  BUN 14 22  CREATININE 0.54 0.63  CALCIUM  10.0 9.4     No results found for this or any previous visit (from the past 240 hours).   Radiology Studies: No results found.  Scheduled Meds:  acetaminophen   1,000 mg Oral TID   diclofenac  Sodium  2 g Topical QID   enoxaparin  (LOVENOX ) injection  30 mg Subcutaneous Q24H   famotidine   20 mg Oral QHS   hydrOXYzine   25 mg Oral QHS   irbesartan   150 mg Oral Daily   polyethylene glycol  17 g Oral Daily  senna-docusate  1 tablet Oral BID   sertraline   50 mg Oral Daily   Continuous Infusions:   LOS: 5 days   Ivonne Mustache, MD [facility] P12/29/2025, 11:15 AM  "

## 2024-01-19 NOTE — TOC Progression Note (Addendum)
 Transition of Care [facility]) - Progression Note    Patient Details  Name: Victoria Long MRN: [id number] Date of Birth: 1928-10-19  Transition [facility]) CM/SW Contact  Heather DELENA Saltness, LCSW Phone Number: 01/19/2024, 1:13 PM  Clinical Narrative:    CSW spoke with Josette at [facility] regarding pt's status for SNF rehab. Josette initially offered bed to pt for SNF rehab. However, bed offer rescinded due to behavioral issues and concern for elopement in the past at [facility]. CSW notified pt's son, Thomas Diachenko [phone], about bed offer being rescinded. CSW advised pt's son of other bed offers, [facility] and [facility]. Pt's son politely declined those offers. Pt's son reports wanting HH PT/OT services at [facility]. Referral sent to [facility] at [facility]. HH orders will need to faxed to [facility] upon discharge at [phone]. Pt's son also reports pt is on waitlist at [facility] for ALF. Pt's son working with facility to see if pt can move into ALF sooner. Pt to discharge to [facility] tomorrow 12/30. TOC will continue to follow.   Expected Discharge Plan: Skilled Nursing Facility Barriers to Discharge: Continued Medical Work up   Expected Discharge Plan and Services In-house Referral: Clinical Social Work     Living arrangements for the past 2 months: Independent Living Facility                 DME Arranged: N/A DME Agency: NA                   Social Drivers of Health (SDOH) Interventions SDOH Screenings   Food Insecurity: No Food Insecurity (01/13/2024)  Housing: Low Risk (01/13/2024)  Transportation Needs: No Transportation Needs (01/13/2024)  Utilities: Not At Risk (01/13/2024)  Alcohol  Screen: Low Risk (11/25/2023)  Depression (PHQ2-9): Low Risk (11/25/2023)  Financial Resource Strain: Low Risk (11/25/2023)  Physical Activity: Inactive (11/25/2023)  Social Connections: Socially Isolated (01/13/2024)  Stress: No Stress Concern Present  (11/25/2023)  Tobacco Use: Medium Risk (01/13/2024)  Health Literacy: Adequate Health Literacy (11/25/2023)    Readmission Risk Interventions    01/16/2024    2:06 PM  Readmission Risk Prevention Plan  Post Dischage Appt Complete  Medication Screening Complete  Transportation Screening Complete    Signed: Heather Saltness, MSW, LCSW Clinical Social Worker Inpatient Care Management 01/19/2024 1:22 PM

## 2024-01-19 NOTE — Plan of Care (Signed)

## 2024-01-19 NOTE — Progress Notes (Signed)
 Physical Therapy Treatment Patient Details Name: Victoria Long MRN: [id number] DOB: [birth year] Today's Date: 01/19/2024   History of Present Illness 89 year old presented to [facility], [place] after fall at [facility]. CT revealed  L3 compression fx. Pt also with R forearm laceration requiring sutures. Admitted for pain control.  PMH: T6 and L1 compression fractures, S/P L1 kyphoplasty 12/10/23, bilateral knee osteoarthritis, stage Ia bilateral breast cancer s/p lumpectomy and radiations, pancreatic mass, prediabetes, vitamin D  deficiency and hyperlipidemia    PT Comments  PT - Cognition Comments: AxO x 2 pleasant and following all commands.  She was also able to express her needs to use the bathroom as well as asking for some warm cloths to wash up.  No anxiety this session.  Pt was pleasnat and calm. Assisted OOB to amb to the bathroom was difficult. General transfer comment: assisted to bathroom, Pt was required increased effort on/off lower level toilet.  Pt also required assist with peri care due to impaired standing balance.  Unsteady with back steps and turns.  HIGH FALL RISK. General Gait Details: assist to steady and manage RW. cues for proximity to RW, posture. bil LEs shaky/unsteadiness.  Amb to and from bathroom. Assisted with amb to recliner with VC's on proper walker to self distance and safety with turns.  Positioned in recliner to comfort. LPT has rec Pt will need ST Rehab at SNF to address mobility and functional decline prior to safely returning home.    If plan is discharge home, recommend the following: A little help with walking and/or transfers;A little help with bathing/dressing/bathroom;Help with stairs or ramp for entrance;Assist for transportation;Assistance with cooking/housework   Can travel by private vehicle     No  Equipment Recommendations  None recommended by PT    Recommendations for Other Services       Precautions / Restrictions Precautions Precautions:  Fall;Back Precaution/Restrictions Comments: back precautions for comfort Restrictions Other Position/Activity Restrictions: TLSO for comfort (Pt unable to tolerate) comfort     Mobility  Bed Mobility Overal bed mobility: Needs Assistance Bed Mobility: Supine to Sit     Supine to sit: HOB elevated, Used rails, Min assist     General bed mobility comments: increased effort and time    Transfers Overall transfer level: Needs assistance Equipment used: Rolling walker (2 wheels) Transfers: Sit to/from Stand Sit to Stand: Min assist, Mod assist           General transfer comment: assisted to bathroom, Pt was required increased effort on/off lower level toilet.  Pt also required assist with peri care due to impaired standing balance.  Unsteady with back steps and turns.  HIGH FALL RISK.    Ambulation/Gait Ambulation/Gait assistance: Min assist, Mod assist Gait Distance (Feet): 24 Feet Assistive device: Rolling walker (2 wheels) Gait Pattern/deviations: Step-to pattern, Decreased step length - right, Decreased step length - left Gait velocity: decreased     General Gait Details: assist to steady and manage RW. cues for proximity to RW, posture. bil LEs shaky/unsteadiness.  Amb to and from bathroom.   Stairs             Wheelchair Mobility     Tilt Bed    Modified Rankin (Stroke Patients Only)       Balance  Communication    Cognition Arousal: Alert Behavior During Therapy: WFL for tasks assessed/performed   PT - Cognitive impairments: No apparent impairments                       PT - Cognition Comments: AxO x 2 pleasant and following all commands.  She was also able to express her needs to use the bathroom as well as asking for some warm cloths to wash up.  No anxiety this session.  Pt was pleasnat and calm.        Cueing    Exercises      General Comments         Pertinent Vitals/Pain Pain Assessment Pain Assessment: No/denies pain Pain Location: no c/o pain throughout session    Home Living                          Prior Function            PT Goals (current goals can now be found in the care plan section) Progress towards PT goals: Progressing toward goals    Frequency    Min 3X/week      PT Plan      Co-evaluation              AM-PAC PT 6 Clicks Mobility   Outcome Measure  Help needed turning from your back to your side while in a flat bed without using bedrails?: A Little Help needed moving from lying on your back to sitting on the side of a flat bed without using bedrails?: A Little Help needed moving to and from a bed to a chair (including a wheelchair)?: A Little Help needed standing up from a chair using your arms (e.g., wheelchair or bedside chair)?: A Little Help needed to walk in hospital room?: A Lot Help needed climbing 3-5 steps with a railing? : A Lot 6 Click Score: 16    End of Session Equipment Utilized During Treatment: Gait belt Activity Tolerance: Patient tolerated treatment well Patient left: in chair;with call bell/phone within reach;Other (comment) Nurse Communication: Mobility status PT Visit Diagnosis: Other abnormalities of gait and mobility (R26.89)     Time: 9061-8995 PT Time Calculation (min) (ACUTE ONLY): 26 min  Charges:    $Gait Training: 8-22 mins $Therapeutic Activity: 8-22 mins PT General Charges $$ ACUTE PT VISIT: 1 Visit                    Katheryn Leap  PTA Acute  Rehabilitation Services Office M-F          [phone]

## 2024-01-20 DIAGNOSIS — S32030A Wedge compression fracture of third lumbar vertebra, initial encounter for closed fracture: Secondary | ICD-10-CM | POA: Diagnosis not present

## 2024-01-20 NOTE — Telephone Encounter (Signed)
 Left a detailed message for the patient's daughter in law to schedule a nurses visit as below for the Prolia  injection.

## 2024-01-20 NOTE — Progress Notes (Signed)
 Occupational Therapy Treatment Patient Details Name: Victoria Long MRN: [id number] DOB: [birth year] Today's Date: 01/20/2024   History of present illness 89 year old presented to [facility] at [facility] CT revealed  L3 compression fx. Pt also with R forearm laceration requiring sutures. Admitted for pain control.  PMH: T6 and L1 compression fractures, S/P L1 kyphoplasty 12/10/23, bilateral knee osteoarthritis, stage Ia bilateral breast cancer s/p lumpectomy and radiations, pancreatic mass, prediabetes, vitamin D  deficiency and hyperlipidemia   OT comments  Pt in bed upon therapy arrival and agreeable to participate in OT treatment session focusing on patient education and ADL re-training. Pt educated on back precautions during bed mobility utilizing log roll technique to prevent pain level from rising. Pt able to walk to/from bathroom to complete toileting with min assistance and use of RW. Patient will benefit from continued inpatient follow up therapy, <3 hours/day.       If plan is discharge home, recommend the following:  A little help with walking and/or transfers;Assistance with cooking/housework;A lot of help with bathing/dressing/bathroom;Assist for transportation   Equipment Recommendations  Other (comment) (defer to next level of care)       Precautions / Restrictions Precautions Precautions: Fall;Back Precaution Booklet Issued: No Recall of Precautions/Restrictions: Impaired Precaution/Restrictions Comments: back precautions for comfort Required Braces or Orthoses: Spinal Brace Spinal Brace: Thoracolumbosacral orthotic;Applied in sitting position Restrictions Weight Bearing Restrictions Per Provider Order: No       Mobility Bed Mobility Overal bed mobility: Needs Assistance Bed Mobility: Rolling, Sidelying to Sit Rolling: Contact guard assist, Used rails Sidelying to sit: Min assist, Used rails, HOB elevated       General bed mobility comments: VC and hands  on assist to complete log roll technique    Transfers Overall transfer level: Needs assistance Equipment used: Rolling walker (2 wheels) Transfers: Sit to/from Stand, Bed to chair/wheelchair/BSC Sit to Stand: Min assist, From elevated surface     Step pivot transfers: Min assist     General transfer comment: Pt walked to/from bathroom with use of RW. VC for hand placement prior to sit<>stand transition requiring tactile cueing.     Balance Overall balance assessment: Needs assistance, History of Falls Sitting-balance support: Feet supported, No upper extremity supported Sitting balance-Leahy Scale: Fair Sitting balance - Comments: sitting EOB   Standing balance support: During functional activity, Reliant on assistive device for balance Standing balance-Leahy Scale: Poor       ADL either performed or assessed with clinical judgement   ADL       Grooming: Wash/dry face;Set up;Sitting;Wash/dry hands;Standing Grooming Details (indicate cue type and reason): Washed hands standing at sink and sat in recliner to wash face      Toilet Transfer: Minimal assistance;Cueing for safety;Ambulation;BSC/3in1;Rolling walker (2 wheels) Toilet Transfer Details (indicate cue type and reason): BSC over toilet Toileting- Clothing Manipulation and Hygiene: Set up;Sitting/lateral lean       Functional mobility during ADLs: Minimal assistance;Cueing for sequencing;Cueing for safety;Rolling walker (2 wheels)                Communication Communication Communication: Impaired Factors Affecting Communication: Hearing impaired   Cognition Arousal: Alert Behavior During Therapy: WFL for tasks assessed/performed Cognition: No family/caregiver present to determine baseline   Orientation impairments: Person, Situation        Following commands: Intact        Cueing   Cueing Techniques: Verbal cues        General Comments VSS on RA    Pertinent Vitals/  Pain       Pain  Assessment Pain Assessment: No/denies pain (resting in bed) Pain Score: 0-No pain  Home Living Family/patient expects to be discharged to:: Assisted living            Frequency  Min 2X/week        Progress Toward Goals  OT Goals(current goals can now be found in the care plan section)  Progress towards OT goals: Progressing toward goals            AM-PAC OT 6 Clicks Daily Activity     Outcome Measure   Help from another person eating meals?: A Little Help from another person taking care of personal grooming?: A Little Help from another person toileting, which includes using toliet, bedpan, or urinal?: A Little Help from another person bathing (including washing, rinsing, drying)?: A Lot Help from another person to put on and taking off regular upper body clothing?: A Little Help from another person to put on and taking off regular lower body clothing?: A Lot 6 Click Score: 16    End of Session Equipment Utilized During Treatment: Gait belt;Rolling walker (2 wheels)  OT Visit Diagnosis: Unsteadiness on feet (R26.81);Other abnormalities of gait and mobility (R26.89);Muscle weakness (generalized) (M62.81);History of falling (Z91.81);Pain Pain - part of body:  (back)   Activity Tolerance Patient tolerated treatment well   Patient Left in chair;with call bell/phone within reach;with chair alarm set           Time: 8570-8540 OT Time Calculation (min): 30 min  Charges: OT General Charges $OT Visit: 1 Visit OT Treatments $Self Care/Home Management : 23-37 mins  Leita Howell, OTR/L,CBIS  Supplemental OT - [facility] and [facility] Secure Chat Preferred    Karas Pickerill, Leita BIRCH 01/20/2024, 3:35 PM

## 2024-01-20 NOTE — Plan of Care (Signed)

## 2024-01-20 NOTE — Discharge Summary (Signed)
 Physician Discharge Summary  Victoria Long FMW:[id number] DOB: 09-17-1928 DOA: 01/13/2024  PCP: Ozell Heron HERO, MD  Admit date: 01/13/2024 Discharge date: 01/21/2024  Admitted From: ILF Disposition:  ILF  Discharge Condition:Stable CODE STATUS:DNR Diet recommendation: Regular   Brief/Interim Summary: 89 year old with history of bilateral knee osteoarthritis, previously treated breast cancer and known pancreatic mass, prediabetes, HLD had a recent hospital stay for acute L1 compression fracture status post IR kyphoplasty who is admitted with acute L3 anterior inferior chip fracture from a mechanical fall from independent living facility. Neurosurgery reviewed the case and recommended TLSO brace for comfort otherwise no other surgical indication. PT recommended SNF placement and therefore TOC consulted.  Son wants to take her back to her independent living.  Medically stable for discharge whenever possible.  Following problems were addressed during the hospitalization:  Acute L3 compression fracture - This is secondary to mechanical fall.  Seen by neurosurgery recommending TLSO brace and pain control otherwise no other interventions planned -PT/OT recommending SNF,son wants to take her back to her previous facility. She would benefit from palliative care team to follow the patient at rehab facility.    Depression - on Zoloft    GERD - on Pepcid    Essential hypertension -Currently on ARB.    Bilateral knee osteoarthritis -Chronic pain control.   Anemia of chronic disease -Hemoglobin around baseline of 12   Memory issues Intermittently confused.  Continue delirium precaution, frequent orientation.  Not agitated       Discharge Diagnoses:  Principal Problem:   Closed compression fracture of L3 vertebra [facility])    Discharge Instructions  Discharge Instructions     Discharge instructions   Complete by: As directed    1)Please take your medications as  instructed 2)Follow up with your PCP in a week 3)Wear TLSO brace when out of bed   Increase activity slowly   Complete by: As directed    No wound care   Complete by: As directed       Allergies as of 01/21/2024       Reactions   Anastrozole  Other (See Comments)   Made the patient feel loopy   Tramadol  Other (See Comments)   Medication caused dizziness        Medication List     TAKE these medications    acetaminophen  500 MG tablet Commonly known as: TYLENOL  Take 2 tablets (1,000 mg total) by mouth 3 (three) times daily for 21 days. What changed:  medication strength how much to take when to take this Another medication with the same name was removed. Continue taking this medication, and follow the directions you see here.   Artificial Tears 1 % ophthalmic solution Generic drug: carboxymethylcellulose Place 1 drop into both eyes 3 (three) times daily as needed (for dryness).   calcium  carbonate 600 MG Tabs tablet Commonly known as: OS-CAL Take 600 mg by mouth daily with breakfast.   Debrox 6.5 % OTIC solution Generic drug: carbamide peroxide Place 5 drops into both ears 2 (two) times daily as needed (as directed to clear the ear canals).   diclofenac  Sodium 1 % Gel Commonly known as: Voltaren  Apply 4 g topically 4 (four) times daily. To both knees What changed:  when to take this reasons to take this additional instructions   famotidine  20 MG tablet Commonly known as: PEPCID  TAKE 1 TABLET BY MOUTH EVERY NIGHT AT BEDTIME What changed:  how much to take how to take this when to take this additional instructions  hydrocortisone  2.5 % rectal cream Commonly known as: Anusol -HC Place 1 Application rectally 2 (two) times daily. What changed:  when to take this reasons to take this   hydrOXYzine  25 MG tablet Commonly known as: ATARAX  Take 1 tablet (25 mg total) by mouth 3 (three) times daily as needed for anxiety. What changed: when to take this    L-Lysine 500 MG Tabs Take 500 mg by mouth in the morning.   MiraLax  17 GM/SCOOP powder Generic drug: polyethylene glycol powder Take 8.5 g by mouth See admin instructions. Dissolve 8.5 grams into 4-8 ounces of coffee and drink by mouth in the morning- HOLD FOR LOOSE STOOLS   MULTIVITAMIN PO Take 1 tablet by mouth daily with breakfast.   olmesartan  20 MG tablet Commonly known as: BENICAR  Take 1 tablet (20 mg total) by mouth daily.   oxyCODONE  5 MG immediate release tablet Commonly known as: Oxy IR/ROXICODONE  Take 1 tablet (5 mg total) by mouth every 6 (six) hours as needed for moderate pain (pain score 4-6) or severe pain (pain score 7-10).   senna-docusate 8.6-50 MG tablet Commonly known as: Senokot-S Take 1 tablet by mouth 2 (two) times daily between meals as needed for mild constipation.   sertraline  50 MG tablet Commonly known as: ZOLOFT  Take 1 tablet (50 mg total) by mouth daily. What changed: when to take this        Follow-up Information     Ozell Heron HERO, MD Follow up in 1 week(s).   Specialty: Family Medicine Contact information: [address] [place] [address] [phone]                Allergies[1]  Consultations: Neurosurgery   Procedures/Studies: CT PELVIS WO CONTRAST Result Date: 01/13/2024 CLINICAL DATA:  Fall.  Hip pain.  Suspected hip or pelvic fracture. EXAM: CT PELVIS WITHOUT CONTRAST TECHNIQUE: Multidetector CT imaging of the pelvis was performed following the standard protocol without intravenous contrast. RADIATION DOSE REDUCTION: This exam was performed according to the departmental dose-optimization program which includes automated exposure control, adjustment of the mA and/or kV according to patient size and/or use of iterative reconstruction technique. COMPARISON:  None Available. FINDINGS: Lower Urinary Tract: Unremarkable urinary bladder. Bowel: Sigmoid diverticulosis noted, without signs of diverticulitis.  Vascular/Lymphatic: No pathologically enlarged lymph nodes or other significant abnormality. Reproductive: 2 cm calcified pedunculated uterine fibroid seen arising from the left anterior fundus. Small amount of air seen in endometrial cavity, of uncertain etiology. No signs of inflammatory process or abnormal fluid collections. Other: None. Musculoskeletal: Generalized osteopenia noted. No evidence of hip fracture or dislocation. No pelvic fractures identified. Moderate bilateral hip osteoarthritis noted. No suspicious bone lesions identified. IMPRESSION: No evidence of hip or pelvic fracture. Moderate bilateral hip osteoarthritis. Small calcified uterine fibroid. Small amount of air in endometrial cavity, of uncertain etiology. Sigmoid diverticulosis, without radiographic evidence of diverticulitis. Electronically Signed   By: Norleen DELENA Kil M.D.   On: 01/13/2024 11:36   DG Forearm Right Result Date: 01/13/2024 EXAM: VIEW(S) XRAY OF THE RIGHT FOREARM 01/13/2024 09:55:00 AM COMPARISON: None available. CLINICAL HISTORY: 89 year old female with laceration, status post fall from standing. FINDINGS: BONES AND JOINTS: Degenerative changes of the wrist. SOFT TISSUES: Bandage material overlies the mid forearm. IMPRESSION: 1. No acute fracture or dislocation identified about the right forearm. Electronically signed by: Helayne Hurst MD 01/13/2024 10:42 AM EST RP Workstation: [id number]   CT Lumbar Spine Wo Contrast Result Date: 01/13/2024 EXAM: CT OF THE LUMBAR SPINE WITHOUT CONTRAST 01/13/2024 10:14:00  AM TECHNIQUE: CT of the lumbar spine was performed without the administration of intravenous contrast. Multiplanar reformatted images are provided for review. Automated exposure control, iterative reconstruction, and/or weight based adjustment of the mA/kV was utilized to reduce the radiation dose to as low as reasonably achievable. COMPARISON: Lumbar spine CT 12/04/2023. CLINICAL HISTORY: 89 year old female. Back  trauma, no prior imaging. Status post fall from standing. Status post L1 compression fracture augmentation. FINDINGS: BONES AND ALIGNMENT: Normal lumbar segmentation as previously designated. Generalized osteopenia. T12 vertebrae appears stable and intact. Interval L1 vertebral body augmentation. Mild retropulsion there is new from November. But no other complicating features. L2 is stable and intact. Subtle new L3 compression fracture since November with 12% loss of vertebral body height. Mild anterior buckling of the L3 vertebral body on series 6 image 40 and similar mild cortical disruption on coronal image 29. L3 pedicles and posterior elements appear intact and aligned. No retropulsion. L4 and L5 appears stable and intact. Visible sacrum and SI joints appears stable and intact. SOFT TISSUES: Chronically abnormal pancreatic tail parenchyma with combined multicystic and dystrophic calcification appearance on series 8 image 67 is stable from November. The visualized portion of the mass is roughly 7 to 8 cm. This meets consensus criteria for follow up surgical consultation in > 38 years of age. Advanced calcified aortoiliac atherosclerosis. Partially visible large bowel diverticulosis and retained stool. Negative lumbar paraspinal soft tissues. DEGENERATIVE CHANGES: Chronic lumbar spine degeneration is stable. IMPRESSION: 1. Osteopenia with new L3 compression fracture since last month, most likely acute. 13% loss of height with no retropulsion or complicating features. 2. Abnormal multicystic and dystrophic calcifications replacement of visible pancreas (estimated 7-8 cm). Lesion size meets consensus criteria for surgical consultation in patients > 25 years old. 3. Interval L1 vertebral body augmentation. Electronically signed by: Helayne Hurst MD 01/13/2024 10:38 AM EST RP Workstation: [id number]   CT Cervical Spine Wo Contrast Result Date: 01/13/2024 EXAM: CT CERVICAL SPINE WITHOUT CONTRAST 01/13/2024 10:14:00  AM TECHNIQUE: CT of the cervical spine was performed without the administration of intravenous contrast. Multiplanar reformatted images are provided for review. Automated exposure control, iterative reconstruction, and/or weight based adjustment of the mA/kV was utilized to reduce the radiation dose to as low as reasonably achievable. COMPARISON: CT thoracic spine 12/04/2023. CLINICAL HISTORY: Neck trauma. Fall from standing. FINDINGS: BONES AND ALIGNMENT: Trace anterolisthesis of C7 on T1. No acute fracture or suspicious lesion. DEGENERATIVE CHANGES: Multilevel disc degeneration, most advanced at C4-C5 and C6-C7. Asymmetrically advanced left sided facet arthrosis in the upper cervical spine. Advanced foraminal stenosis on the left at C3-C4 due to facet spurring and on the right at C4-C5 due to uncovertebral spurring. SOFT TISSUES: No prevertebral soft tissue swelling. IMPRESSION: 1. No acute cervical spine fracture or traumatic malalignment. Electronically signed by: Dasie Hamburg MD 01/13/2024 10:31 AM EST RP Workstation: [id number]   CT Head Wo Contrast Result Date: 01/13/2024 EXAM: CT HEAD WITHOUT CONTRAST 01/13/2024 10:14:00 AM TECHNIQUE: CT of the head was performed without the administration of intravenous contrast. Automated exposure control, iterative reconstruction, and/or weight based adjustment of the mA/kV was utilized to reduce the radiation dose to as low as reasonably achievable. COMPARISON: None available. CLINICAL HISTORY: Head trauma, minor. Fall from standing. FINDINGS: BRAIN AND VENTRICLES: There is no evidence of an acute infarct, intracranial hemorrhage, mass, midline shift, hydrocephalus, or extra-axial fluid collection. Mild cerebral atrophy is within normal limits for age. Cerebral white matter hypodensities are nonspecific but compatible with mild chronic small vessel ischemic  disease. Calcified atherosclerosis at the skull base. ORBITS: No acute abnormality. Bilateral cataract  extraction. SINUSES: No acute abnormality. SOFT TISSUES AND SKULL: No acute soft tissue abnormality. No skull fracture. IMPRESSION: 1. No acute intracranial abnormality. 2. Mild chronic small vessel ischemic disease. Electronically signed by: Dasie Hamburg MD 01/13/2024 10:23 AM EST RP Workstation: [id number]   DG Bone Density Result Date: 01/02/2024 Table formatting from the original result was not included. Date of study: 01/02/2024 Exam: DUAL X-RAY ABSORPTIOMETRY (DXA) FOR BONE MINERAL DENSITY (BMD) Instrument: [employer] Indication: screening for osteoporosis Comparison:  none (please note that it is not possible to compare data from different instruments) Clinical data: Pt is a 89 y.o. female with history of fracture. Results:  Lumbar spine L1-L4 Femoral neck (FN) 33% distal radius T-score   -2.0 RFN: - 2.7 LFN: - 2.5 - 3.5 Assessment: Patient has OSTEOPOROSIS according to the [facility] classification for osteoporosis (see below). Fracture risk: high Comments: the technical quality of the study is good. In patients older than 75, the high prevalence of degenerative changes in the lumbar spine results in artificially higher bone density readings. L1, L4 vertebrae had to be excluded from analysis due to DJD WHO criteria for diagnosis of osteoporosis in postmenopausal women and in men 53 y/o or older: - normal: T-score -1.0 to + 1.0 - osteopenia/low bone density: T-score between -2.5 and -1.0 - osteoporosis: T-score below -2.5 - severe osteoporosis: T-score below -2.5 with history of fragility fracture Note: although not part of the WHO classification, the presence of a fragility fracture, regardless of the T-score, should be considered diagnostic of osteoporosis, provided other causes for the fracture have been excluded. Treatment: The National Osteoporosis Foundation recommends that treatment be considered in postmenopausal women and men age 92 or older with: 1. Hip or vertebral (clinical or morphometric)  fracture 2. T-score of - 2.5 or lower at the spine or hip 3. 10-year fracture probability by FRAX of at least 20% for a major osteoporotic fracture and 3% for a hip fracture Followup: Repeat BMD in 2  years or as clinically indicated Interpreted by : Stefano Redgie Butts, MD [facility]      Subjective: Patient seen and examined at bedside today.  Hemodynamically stable.  Comfortable.  Not in any kind of acute distress.  Back pain well-controlled.  Medically stable for discharge whenever possible  Discharge Exam: Vitals:   01/20/24 2103 01/21/24 0656  BP: (!) 135/55 (!) 155/67  Pulse: 81 72  Resp: 16 18  Temp: 98.3 F (36.8 C) 98.8 F (37.1 C)  SpO2: 96% 100%   Vitals:   01/20/24 1349 01/20/24 1800 01/20/24 2103 01/21/24 0656  BP: (!) 128/56 (!) 149/68 (!) 135/55 (!) 155/67  Pulse: 78 70 81 72  Resp: 16 (!) 22 16 18   Temp: 97.6 F (36.4 C)  98.3 F (36.8 C) 98.8 F (37.1 C)  TempSrc: Oral  Oral Oral  SpO2: 98% 97% 96% 100%  Weight:      Height:        General: Pt is alert, awake, not in acute distress, pleasantly confused Cardiovascular: RRR, S1/S2 +, no rubs, no gallops Respiratory: CTA bilaterally, no wheezing, no rhonchi Abdominal: Soft, NT, ND, bowel sounds + Extremities: no edema, no cyanosis    The results of significant diagnostics from this hospitalization (including imaging, microbiology, ancillary and laboratory) are listed below for reference.     Microbiology: No results found for this or any previous visit (from the past 240  hours).   Labs: BNP (last 3 results) No results for input(s): BNP in the last 8760 hours. Basic Metabolic Panel: No results for input(s): NA, K, CL, CO2, GLUCOSE, BUN, CREATININE, CALCIUM , MG, PHOS in the last 168 hours.  Liver Function Tests: No results for input(s): AST, ALT, ALKPHOS, BILITOT, PROT, ALBUMIN in the last 168 hours. No results for input(s): LIPASE, AMYLASE in  the last 168 hours. No results for input(s): AMMONIA in the last 168 hours. CBC: No results for input(s): WBC, NEUTROABS, HGB, HCT, MCV, PLT in the last 168 hours.  Cardiac Enzymes: No results for input(s): CKTOTAL, CKMB, CKMBINDEX, TROPONINI in the last 168 hours. BNP: Invalid input(s): POCBNP CBG: No results for input(s): GLUCAP in the last 168 hours. D-Dimer No results for input(s): DDIMER in the last 72 hours. Hgb A1c No results for input(s): HGBA1C in the last 72 hours. Lipid Profile No results for input(s): CHOL, HDL, LDLCALC, TRIG, CHOLHDL, LDLDIRECT in the last 72 hours. Thyroid  function studies No results for input(s): TSH, T4TOTAL, T3FREE, THYROIDAB in the last 72 hours.  Invalid input(s): FREET3 Anemia work up No results for input(s): VITAMINB12, FOLATE, FERRITIN, TIBC, IRON, RETICCTPCT in the last 72 hours. Urinalysis    Component Value Date/Time   COLORURINE YELLOW 12/04/2023 1728   APPEARANCEUR CLEAR 12/04/2023 1728   LABSPEC 1.023 12/04/2023 1728   PHURINE 6.5 12/04/2023 1728   GLUCOSEU NEGATIVE 12/04/2023 1728   HGBUR NEGATIVE 12/04/2023 1728   BILIRUBINUR NEGATIVE 12/04/2023 1728   BILIRUBINUR negative 10/23/2023 1440   KETONESUR TRACE (A) 12/04/2023 1728   PROTEINUR TRACE (A) 12/04/2023 1728   UROBILINOGEN negative (A) 10/23/2023 1440   UROBILINOGEN 0.2 08/09/2014 1505   NITRITE NEGATIVE 12/04/2023 1728   LEUKOCYTESUR NEGATIVE 12/04/2023 1728   Sepsis Labs No results for input(s): WBC in the last 168 hours.  Invalid input(s): PROCALCITONIN, LACTICIDVEN  Microbiology No results found for this or any previous visit (from the past 240 hours).  Please note: You were cared for by a hospitalist during your hospital stay. Once you are discharged, your primary care physician will handle any further medical issues. Please note that NO REFILLS for any discharge medications will be  authorized once you are discharged, as it is imperative that you return to your primary care physician (or establish a relationship with a primary care physician if you do not have one) for your post hospital discharge needs so that they can reassess your need for medications and monitor your lab values.    Time coordinating discharge: 40 minutes  SIGNED:   Ivonne Mustache, MD  [facility] 01/21/2024, 9:50 AM Pager [phone]  If 7PM-7AM, please contact night-coverage [email or web address] Password [email or web address]     [1]  Allergies Allergen Reactions   Anastrozole  Other (See Comments)    Made the patient feel loopy   Tramadol  Other (See Comments)    Medication caused dizziness

## 2024-01-21 NOTE — Care Management Important Message (Signed)
 Important Message  Patient Details IM Letter given. Name: Victoria Long MRN: [id number] Date of Birth: [birth year]   Important Message Given:  Yes - Medicare IM     Melba Ates 01/21/2024, 10:09 AM

## 2024-01-21 NOTE — Progress Notes (Signed)
 Patient seen and examined at bedside today.  Comfortably lying on bed.  Son at the bedside.  No new medical issues.  She is medically stable for discharge to ILF today.  No new change in  the medical management.  Discharge summary and orders are already in place.

## 2024-01-21 NOTE — TOC Transition Note (Signed)
 Transition of Care [facility]) - Discharge Note   Patient Details  Name: Victoria Long MRN: [id number] Date of Birth: Nov 15, 1928  [facility] CM/SW Contact:  NORMAN ASPEN, LCSW Phone Number: 01/21/2024, 9:37 AM   Clinical Narrative:     Have confirmed with pt's family that caregivers are in place today for pt's return to [place] apt at [facility]  24/7 care to be provided and shared between family and hired assistance.  [facility] to provide needed therapy.  Family to provide dc transportation.  No further IP CM needs.   Final next level of care: Other (comment) ([place] at [facility]) Barriers to Discharge: Barriers Resolved   Patient Goals and CMS Choice Patient states their goals for this hospitalization and ongoing recovery are:: return to [facility] after rehab          Discharge Placement                       Discharge Plan and Services Additional resources added to the After Visit Summary for   In-house Referral: Clinical Social Work              DME Arranged: N/A DME Agency: NA       HH Arranged: PT, OT, Nurse's Aide HH Agency: Other - See comment [employer] Worker Rehab @ facility)        Social Drivers of Health (SDOH) Interventions SDOH Screenings   Food Insecurity: No Food Insecurity (01/13/2024)  Housing: Low Risk (01/13/2024)  Transportation Needs: No Transportation Needs (01/13/2024)  Utilities: Not At Risk (01/13/2024)  Alcohol  Screen: Low Risk (11/25/2023)  Depression (PHQ2-9): Low Risk (11/25/2023)  Financial Resource Strain: Low Risk (11/25/2023)  Physical Activity: Inactive (11/25/2023)  Social Connections: Socially Isolated (01/13/2024)  Stress: No Stress Concern Present (11/25/2023)  Tobacco Use: Medium Risk (01/13/2024)  Health Literacy: Adequate Health Literacy (11/25/2023)     Readmission Risk Interventions    01/16/2024    2:06 PM  Readmission Risk Prevention Plan  Post Dischage Appt Complete  Medication Screening  Complete  Transportation Screening Complete

## 2024-01-28 ENCOUNTER — Ambulatory Visit: Payer: Medicare PPO | Admitting: Nurse Practitioner

## 2024-02-03 ENCOUNTER — Encounter: Payer: Self-pay | Admitting: Family Medicine

## 2024-02-03 ENCOUNTER — Ambulatory Visit: Admitting: Family Medicine

## 2024-02-04 ENCOUNTER — Encounter: Payer: Self-pay | Admitting: Family Medicine

## 2024-02-04 ENCOUNTER — Ambulatory Visit: Admitting: Family Medicine

## 2024-02-04 VITALS — BP 148/70 | HR 70 | Temp 97.9°F | Ht 61.5 in

## 2024-02-04 DIAGNOSIS — I10 Essential (primary) hypertension: Secondary | ICD-10-CM

## 2024-02-04 DIAGNOSIS — M8000XA Age-related osteoporosis with current pathological fracture, unspecified site, initial encounter for fracture: Secondary | ICD-10-CM

## 2024-02-04 DIAGNOSIS — M8000XD Age-related osteoporosis with current pathological fracture, unspecified site, subsequent encounter for fracture with routine healing: Secondary | ICD-10-CM

## 2024-02-04 MED ORDER — DENOSUMAB 60 MG/ML ~~LOC~~ SOSY: 60.0000 mg | PREFILLED_SYRINGE | SUBCUTANEOUS | Status: AC

## 2024-02-04 NOTE — Progress Notes (Signed)
 "  Established Patient Office Visit  Subjective   Patient ID: Victoria Long, female    DOB: 08-Feb-1928  Age: 89 y.o. MRN: [id number]  Chief Complaint  Patient presents with   Laceration    Patient complains of laceration right forearm x3 weeks after she fell, patient denies pain, only to the touch and family concerned with the dressing    Laceration   Discussed the use of AI scribe software for clinical note transcription with the patient, who gave verbal consent to proceed.  History of Present Illness   Victoria Long is a 89 year old female who presents for follow-up on her spinal condition and wound healing.  She was recently hospitalized for an L1 compression fracture and an L3 inferior chip fracture from two falls around Thanksgiving and Christmas, and underwent kyphoplasty for the L1 fracture. She then did rehabilitation at [facility] and is now in physical therapy, which is going well.  She uses a TLSO brace for comfort. Neurosurgery advised it is optional. She finds it comfortable and it helps keep her head straight but does not significantly reduce pain. She takes Tylenol  with good pain control and is now able to walk around the hall, which she could not do two weeks ago.  She is due for her Prolia  injection, which she receives every six months. Her blood pressure has been in the 140s on medication. She has round-the-clock care in independent living and plans to move to assisted living. She eats and sleeps well and is physically active without pain.  She has a healing wound with dissolvable stitches and scabbing and continues to keep it covered with bandages for protection.       Current Outpatient Medications  Medication Instructions   ARTIFICIAL TEARS 1 % ophthalmic solution 1 drop, 3 times daily PRN   calcium  carbonate (OS-CAL) 600 mg, Daily with breakfast   DEBROX 6.5 % OTIC solution 5 drops, 2 times daily PRN   diclofenac  Sodium (VOLTAREN ) 4 g, Topical,  4 times daily, To both knees   famotidine  (PEPCID ) 20 MG tablet TAKE 1 TABLET BY MOUTH EVERY NIGHT AT BEDTIME   hydrocortisone  (ANUSOL -HC) 2.5 % rectal cream 1 Application, Rectal, 2 times daily   hydrOXYzine  (ATARAX ) 25 mg, Oral, 3 times daily PRN   L-Lysine 500 mg, Every morning   MiraLax  8.5 g, See admin instructions   Multiple Vitamins-Minerals (MULTIVITAMIN PO) 1 tablet, Daily with breakfast   olmesartan  (BENICAR ) 20 mg, Oral, Daily   oxyCODONE  (OXY IR/ROXICODONE ) 5 mg, Oral, Every 6 hours PRN   senna-docusate (SENOKOT-S) 8.6-50 MG tablet 1 tablet, Oral, 2 times daily between meals PRN   sertraline  (ZOLOFT ) 50 mg, Oral, Daily    Patient Active Problem List   Diagnosis Date Noted   Osteoporosis with current pathological fracture 02/04/2024   Closed compression fracture of L3 vertebra (HCC) 01/13/2024   Protein-calorie malnutrition, severe 12/10/2023   Closed compression fracture of thoracolumbar vertebra (HCC) 12/08/2023   Normocytic anemia 12/08/2023   External hemorrhoids 06/14/2020   Sigmoid diverticulosis 05/02/2020   Senile purpura 01/11/2019   Unilateral primary osteoarthritis, right knee 08/13/2018   Atherosclerosis of aorta 05/28/2018   Malignant neoplasm of upper-outer quadrant of left breast in female, estrogen receptor positive (HCC) 07/23/2017   Melanoma in situ of upper arm, right (HCC) 07/23/2017   At high risk for falls 07/23/2017   Invasive ductal carcinoma of breast, female, left (HCC) 07/15/2017   Essential hypertension 10/01/2016   Pancreatic mass 08/22/2016  History of right breast cancer 10/25/2013   Encounter for long-term (current) use of medications 04/19/2013   Hyperlipidemia    Abnormal glucose    Vitamin D  deficiency      Review of Systems  All other systems reviewed and are negative.     Objective:     BP (!) 148/70   Pulse 70   Temp 97.9 F (36.6 C) (Oral)   Ht 5' 1.5 (1.562 m)   SpO2 98%   BMI 19.70 kg/m    Physical  Exam Vitals reviewed.  Constitutional:      Appearance: Normal appearance. She is normal weight.  Cardiovascular:     Rate and Rhythm: Normal rate and regular rhythm.     Pulses: Normal pulses.  Pulmonary:     Effort: Pulmonary effort is normal.     Breath sounds: Normal breath sounds. No wheezing.  Chest:     Chest wall: No tenderness.  Abdominal:     General: Bowel sounds are normal.  Neurological:     Mental Status: She is alert and oriented to person, place, and time. Mental status is at baseline.  Psychiatric:        Mood and Affect: Mood normal.        Behavior: Behavior normal.      No results found for any visits on 02/04/24.    The ASCVD Risk score (Arnett DK, et al., 2019) failed to calculate for the following reasons:   The 2019 ASCVD risk score is only valid for ages 61 to 98   * - Cholesterol units were assumed    Assessment & Plan:  Essential hypertension  Age-related osteoporosis with current pathological fracture with routine healing, subsequent encounter -     Denosumab    Assessment and Plan    Osteoporosis with vertebral compression fractures L1 compression fracture treated with kyphoplasty and L3 inferior chip fracture. Currently undergoing rehabilitation with improved mobility. No strict spinal precautions required. Brace not medically necessary unless providing comfort or pain relief. Tylenol  effective for pain management. - Continue physical therapy. - Administered Prolia  injection today. - Provided letter stating TLSO brace is not medically required but may be worn for comfort. - Continue Tylenol  up to 4000 mg per day as needed for pain.  Essential hypertension Blood pressure consistently in the 140s. Current medications not well tolerated. No changes to antihypertensive regimen due to fall risk associated with lower blood pressure. - Continue current antihypertensive regimen.  General health maintenance Routine health maintenance discussed,  including Prolia  injections for osteoporosis management. - Administered Prolia  injection today. - Scheduled next Prolia  injection in July.        Return in about 6 months (around 08/03/2024) for ok to cancel the May appointment, move it to July please.    Heron CHRISTELLA Sharper, MD "

## 2024-02-04 NOTE — Patient Instructions (Signed)
 OK to take up to 4000 mg of tylenol  per day

## 2024-02-05 ENCOUNTER — Ambulatory Visit

## 2024-02-19 ENCOUNTER — Ambulatory Visit: Admitting: Family Medicine

## 2024-02-19 ENCOUNTER — Encounter: Payer: Self-pay | Admitting: Family Medicine

## 2024-02-19 VITALS — BP 148/62 | HR 80 | Temp 97.6°F | Ht 61.5 in

## 2024-02-19 DIAGNOSIS — K644 Residual hemorrhoidal skin tags: Secondary | ICD-10-CM

## 2024-02-19 DIAGNOSIS — L89311 Pressure ulcer of right buttock, stage 1: Secondary | ICD-10-CM | POA: Diagnosis not present

## 2024-02-19 NOTE — Progress Notes (Signed)
 "  Established Patient Office Visit  Subjective   Patient ID: Shayanne Gomm, female    DOB: 11-08-1928  Age: 89 y.o. MRN: [id number]  Chief Complaint  Patient presents with   Sore    Patient complains of painful bedsore noted along right buttock x3-4 days, tried Neosporin with some relief    HPI Discussed the use of AI scribe software for clinical note transcription with the patient, who gave verbal consent to proceed.  History of Present Illness   Venita Seng is a 89 year old female who presents with a sore on her bottom.  She has had a sore on her bottom for about three days, located halfway down on her hip. It is tender and painful, worse with sitting. She describes a small red area. She uses a donut cushion for pressure relief and has applied Neosporin, though the skin is not broken.  She has hemorrhoids that are currently not inflamed. She uses Tux pads, daily Miralax , and Anusol  suppositories. Her bowel movements are moderately loose but acceptable to her.       Current Outpatient Medications  Medication Instructions   ARTIFICIAL TEARS 1 % ophthalmic solution 1 drop, 3 times daily PRN   calcium  carbonate (OS-CAL) 600 mg, Daily with breakfast   DEBROX 6.5 % OTIC solution 5 drops, 2 times daily PRN   diclofenac  Sodium (VOLTAREN ) 4 g, Topical, 4 times daily, To both knees   famotidine  (PEPCID ) 20 MG tablet TAKE 1 TABLET BY MOUTH EVERY NIGHT AT BEDTIME   hydrocortisone  (ANUSOL -HC) 2.5 % rectal cream 1 Application, Rectal, 2 times daily   hydrOXYzine  (ATARAX ) 25 mg, Oral, 3 times daily PRN   L-Lysine 500 mg, Every morning   MiraLax  8.5 g, See admin instructions   Multiple Vitamins-Minerals (MULTIVITAMIN PO) 1 tablet, Daily with breakfast   olmesartan  (BENICAR ) 20 mg, Oral, Daily   oxyCODONE  (OXY IR/ROXICODONE ) 5 mg, Oral, Every 6 hours PRN   senna-docusate (SENOKOT-S) 8.6-50 MG tablet 1 tablet, Oral, 2 times daily between meals PRN   sertraline  (ZOLOFT ) 50  mg, Oral, Daily    Patient Active Problem List   Diagnosis Date Noted   Osteoporosis with current pathological fracture 02/04/2024   Closed compression fracture of L3 vertebra (HCC) 01/13/2024   Protein-calorie malnutrition, severe 12/10/2023   Closed compression fracture of thoracolumbar vertebra (HCC) 12/08/2023   Normocytic anemia 12/08/2023   External hemorrhoids 06/14/2020   Sigmoid diverticulosis 05/02/2020   Senile purpura 01/11/2019   Unilateral primary osteoarthritis, right knee 08/13/2018   Atherosclerosis of aorta 05/28/2018   Malignant neoplasm of upper-outer quadrant of left breast in female, estrogen receptor positive (HCC) 07/23/2017   Melanoma in situ of upper arm, right (HCC) 07/23/2017   At high risk for falls 07/23/2017   Invasive ductal carcinoma of breast, female, left (HCC) 07/15/2017   Essential hypertension 10/01/2016   Pancreatic mass 08/22/2016   History of right breast cancer 10/25/2013   Encounter for long-term (current) use of medications 04/19/2013   Hyperlipidemia    Abnormal glucose    Vitamin D  deficiency      Review of Systems  All other systems reviewed and are negative.     Objective:     BP (!) 148/62   Pulse 80   Temp 97.6 F (36.4 C) (Oral)   Ht 5' 1.5 (1.562 m)   SpO2 98%   BMI 19.70 kg/m    Physical Exam Vitals reviewed.  Skin:    Findings: Lesion present.  Comments: Right lower buttock towards the midline there is a small area (<2 inches) of mild skin erythema, flaking, skin breakdown) no open wounds      No results found for any visits on 02/19/24.    The ASCVD Risk score (Arnett DK, et al., 2019) failed to calculate for the following reasons:   The 2019 ASCVD risk score is only valid for ages 52 to 76   * - Cholesterol units were assumed    Assessment & Plan:  Pressure injury of right buttock, stage 1  External hemorrhoids -     Ambulatory referral to Colorectal Surgery   Assessment and Plan     Pressure injury of right buttock, stage 1 Stage 1 pressure injury on the right buttock, present for three days, tender and painful when sitting. No signs of infection, ulceration, or drainage. Likely due to pressure from sitting, causing skin breakdown. No need for antibiotics or special wound care as the skin is not open or draining. - Use a donut cushion or memory foam cushion to relieve pressure. - Sit on the left hip to offload pressure from the right buttock. - Apply Neosporin ointment if desired, though not necessary as the skin is not open. - Monitor for signs of infection or worsening.  External hemorrhoids Chronic external hemorrhoids, previously treated with hydrocortisone  cream. Currently not inflamed or causing significant discomfort. Discussed potential surgical options, including hemorrhoid banding, which is minimally invasive and performed by a GI surgeon. She is open to surgical consultation if necessary. - Referred to colorectal surgeon for evaluation and discussion of surgical options. - Continue hydrocortisone  cream as needed for inflammation or discomfort.        No follow-ups on file.    Heron CHRISTELLA Sharper, MD "

## 2024-02-19 NOTE — Telephone Encounter (Signed)
 Spoke with the patient's daughter-in-law, informed her a visit is needed for evaluation and scheduled an appt today at 3:40pm.

## 2024-06-01 ENCOUNTER — Ambulatory Visit: Admitting: Family Medicine

## 2024-08-09 ENCOUNTER — Ambulatory Visit: Admitting: Family Medicine
